# Patient Record
Sex: Female | Born: 1945 | Race: Black or African American | Hispanic: No | Smoking: Current every day smoker
Health system: Southern US, Community
[De-identification: ages and names within clinical notes are randomized; demographics above are authoritative.]

## PROBLEM LIST (undated history)

## (undated) DIAGNOSIS — C801 Malignant (primary) neoplasm, unspecified: Secondary | ICD-10-CM

## (undated) HISTORY — PX: GALLBLADDER SURGERY: SHX652

## (undated) HISTORY — PX: TOTAL KNEE ARTHROPLASTY: SHX125

## (undated) HISTORY — PX: OTHER SURGICAL HISTORY: SHX169

## (undated) HISTORY — DX: Malignant (primary) neoplasm, unspecified: C80.1

## (undated) MED FILL — Immune Globulin (Human) IV Soln 10 GM/100ML: INTRAVENOUS | Qty: 450 | Status: AC

## (undated) MED FILL — Haemophilus B Polysaccharide Conjugate Vaccine For Inj: INTRAMUSCULAR | Qty: 0.5 | Status: AC

## (undated) MED FILL — Meningococcal Vac B (Recomb OMV Adjuv) Inj Prefilled Syringe: INTRAMUSCULAR | Qty: 0.5 | Status: AC

## (undated) MED FILL — Meningococcal (A, C, Y, and W-135) Oligo Conj Vac IM Soln: INTRAMUSCULAR | Qty: 0.5 | Status: AC

---

## 1998-01-13 ENCOUNTER — Ambulatory Visit (HOSPITAL_COMMUNITY): Admission: RE | Admit: 1998-01-13 | Discharge: 1998-01-13 | Payer: Self-pay | Admitting: *Deleted

## 1998-12-05 ENCOUNTER — Ambulatory Visit (HOSPITAL_BASED_OUTPATIENT_CLINIC_OR_DEPARTMENT_OTHER): Admission: RE | Admit: 1998-12-05 | Discharge: 1998-12-05 | Payer: Self-pay | Admitting: Orthopedic Surgery

## 1998-12-07 ENCOUNTER — Ambulatory Visit (HOSPITAL_COMMUNITY): Admission: RE | Admit: 1998-12-07 | Discharge: 1998-12-07 | Payer: Self-pay | Admitting: Orthopedic Surgery

## 1998-12-07 ENCOUNTER — Encounter: Payer: Self-pay | Admitting: Orthopedic Surgery

## 1998-12-25 ENCOUNTER — Ambulatory Visit (HOSPITAL_COMMUNITY): Admission: RE | Admit: 1998-12-25 | Discharge: 1998-12-25 | Payer: Self-pay | Admitting: Orthopedic Surgery

## 1998-12-25 ENCOUNTER — Encounter: Payer: Self-pay | Admitting: Orthopedic Surgery

## 1999-01-16 ENCOUNTER — Ambulatory Visit (HOSPITAL_COMMUNITY): Admission: RE | Admit: 1999-01-16 | Discharge: 1999-01-16 | Payer: Self-pay | Admitting: Unknown Physician Specialty

## 1999-01-16 ENCOUNTER — Encounter: Payer: Self-pay | Admitting: Unknown Physician Specialty

## 1999-03-28 ENCOUNTER — Other Ambulatory Visit: Admission: RE | Admit: 1999-03-28 | Discharge: 1999-04-17 | Payer: Self-pay | Admitting: Family Medicine

## 2000-01-17 ENCOUNTER — Encounter: Payer: Self-pay | Admitting: Internal Medicine

## 2000-01-17 ENCOUNTER — Ambulatory Visit (HOSPITAL_COMMUNITY): Admission: RE | Admit: 2000-01-17 | Discharge: 2000-01-17 | Payer: Self-pay | Admitting: Internal Medicine

## 2001-01-19 ENCOUNTER — Encounter: Payer: Self-pay | Admitting: Internal Medicine

## 2001-01-19 ENCOUNTER — Ambulatory Visit (HOSPITAL_COMMUNITY): Admission: RE | Admit: 2001-01-19 | Discharge: 2001-01-19 | Payer: Self-pay | Admitting: Internal Medicine

## 2002-01-20 ENCOUNTER — Encounter: Payer: Self-pay | Admitting: Internal Medicine

## 2002-01-20 ENCOUNTER — Ambulatory Visit (HOSPITAL_COMMUNITY): Admission: RE | Admit: 2002-01-20 | Discharge: 2002-01-20 | Payer: Self-pay | Admitting: Internal Medicine

## 2002-10-26 ENCOUNTER — Encounter: Payer: Self-pay | Admitting: Orthopedic Surgery

## 2002-11-01 ENCOUNTER — Inpatient Hospital Stay (HOSPITAL_COMMUNITY): Admission: RE | Admit: 2002-11-01 | Discharge: 2002-11-04 | Payer: Self-pay | Admitting: Orthopedic Surgery

## 2003-01-24 ENCOUNTER — Encounter: Payer: Self-pay | Admitting: Internal Medicine

## 2003-01-24 ENCOUNTER — Ambulatory Visit (HOSPITAL_COMMUNITY): Admission: RE | Admit: 2003-01-24 | Discharge: 2003-01-24 | Payer: Self-pay | Admitting: Internal Medicine

## 2003-08-18 ENCOUNTER — Other Ambulatory Visit: Admission: RE | Admit: 2003-08-18 | Discharge: 2003-08-18 | Payer: Self-pay | Admitting: Oncology

## 2004-01-25 ENCOUNTER — Ambulatory Visit (HOSPITAL_COMMUNITY): Admission: RE | Admit: 2004-01-25 | Discharge: 2004-01-25 | Payer: Self-pay | Admitting: Internal Medicine

## 2004-02-10 ENCOUNTER — Ambulatory Visit: Payer: Self-pay | Admitting: Oncology

## 2004-05-30 ENCOUNTER — Ambulatory Visit: Payer: Self-pay | Admitting: Oncology

## 2004-09-05 ENCOUNTER — Ambulatory Visit: Payer: Self-pay | Admitting: Oncology

## 2004-12-07 ENCOUNTER — Ambulatory Visit: Payer: Self-pay | Admitting: Oncology

## 2005-02-05 ENCOUNTER — Ambulatory Visit (HOSPITAL_COMMUNITY): Admission: RE | Admit: 2005-02-05 | Discharge: 2005-02-05 | Payer: Self-pay | Admitting: Internal Medicine

## 2005-03-04 ENCOUNTER — Ambulatory Visit: Payer: Self-pay | Admitting: Oncology

## 2005-05-27 ENCOUNTER — Ambulatory Visit: Payer: Self-pay | Admitting: Oncology

## 2005-08-19 ENCOUNTER — Ambulatory Visit: Payer: Self-pay | Admitting: Oncology

## 2005-11-11 ENCOUNTER — Ambulatory Visit: Payer: Self-pay | Admitting: Oncology

## 2006-01-09 ENCOUNTER — Ambulatory Visit: Payer: Self-pay | Admitting: Oncology

## 2006-02-11 ENCOUNTER — Ambulatory Visit (HOSPITAL_COMMUNITY): Admission: RE | Admit: 2006-02-11 | Discharge: 2006-02-11 | Payer: Self-pay | Admitting: *Deleted

## 2006-03-17 ENCOUNTER — Inpatient Hospital Stay (HOSPITAL_COMMUNITY): Admission: RE | Admit: 2006-03-17 | Discharge: 2006-03-19 | Payer: Self-pay | Admitting: Orthopedic Surgery

## 2006-04-04 ENCOUNTER — Ambulatory Visit: Payer: Self-pay | Admitting: Oncology

## 2006-09-19 ENCOUNTER — Ambulatory Visit: Payer: Self-pay | Admitting: Oncology

## 2006-12-17 ENCOUNTER — Ambulatory Visit: Payer: Self-pay | Admitting: Oncology

## 2007-02-17 ENCOUNTER — Ambulatory Visit (HOSPITAL_COMMUNITY): Admission: RE | Admit: 2007-02-17 | Discharge: 2007-02-17 | Payer: Self-pay | Admitting: Internal Medicine

## 2007-06-27 ENCOUNTER — Emergency Department (HOSPITAL_COMMUNITY): Admission: EM | Admit: 2007-06-27 | Discharge: 2007-06-27 | Payer: Self-pay | Admitting: Emergency Medicine

## 2008-02-22 ENCOUNTER — Ambulatory Visit (HOSPITAL_COMMUNITY): Admission: RE | Admit: 2008-02-22 | Discharge: 2008-02-22 | Payer: Self-pay | Admitting: Oncology

## 2008-05-30 ENCOUNTER — Other Ambulatory Visit: Admission: RE | Admit: 2008-05-30 | Discharge: 2008-05-30 | Payer: Self-pay | Admitting: Oncology

## 2008-11-19 IMAGING — CR DG CHEST 2V
2 series · 2 of 2 positions shown · non-contrast
Comparison: none

CLINICAL DATA: Osteoarthritis.  Preadmission respiratory film.  Patient for knee replacement. 
 CHEST ? 2 VIEW:

[view not recorded (1 of 2)]
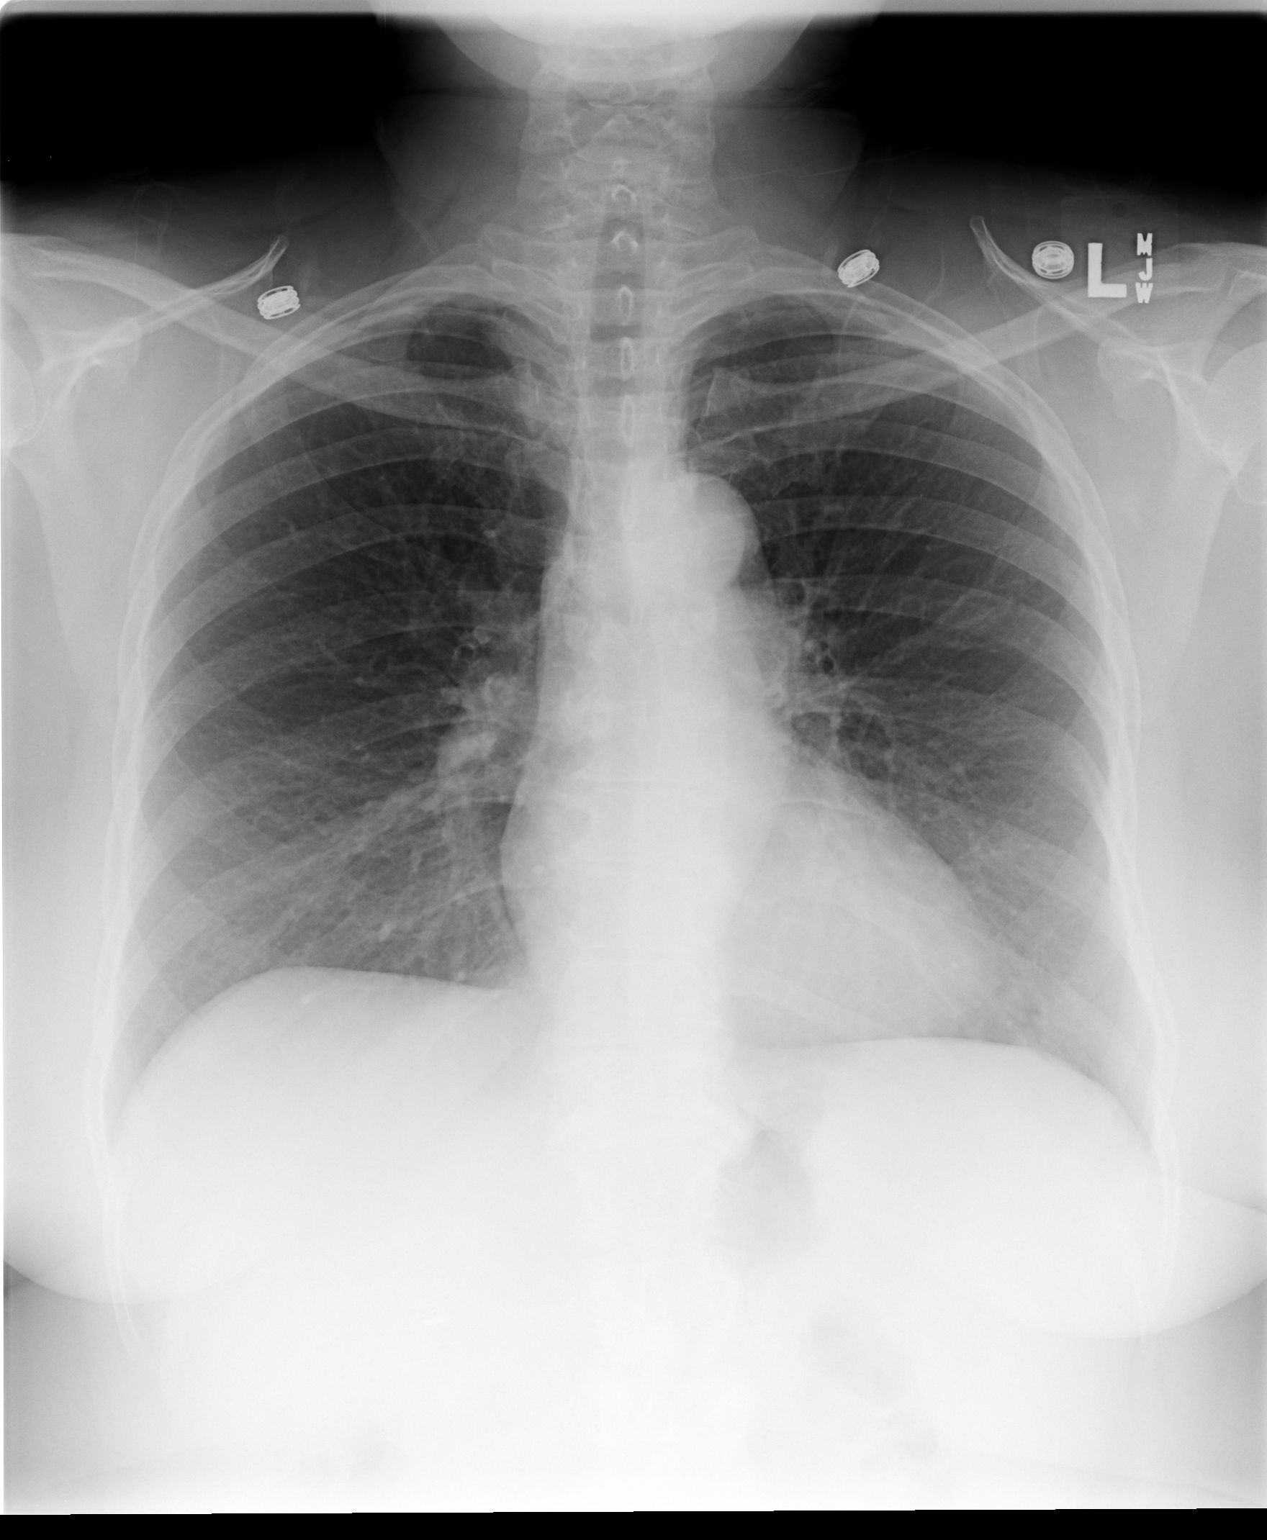

[view not recorded (2 of 2)]
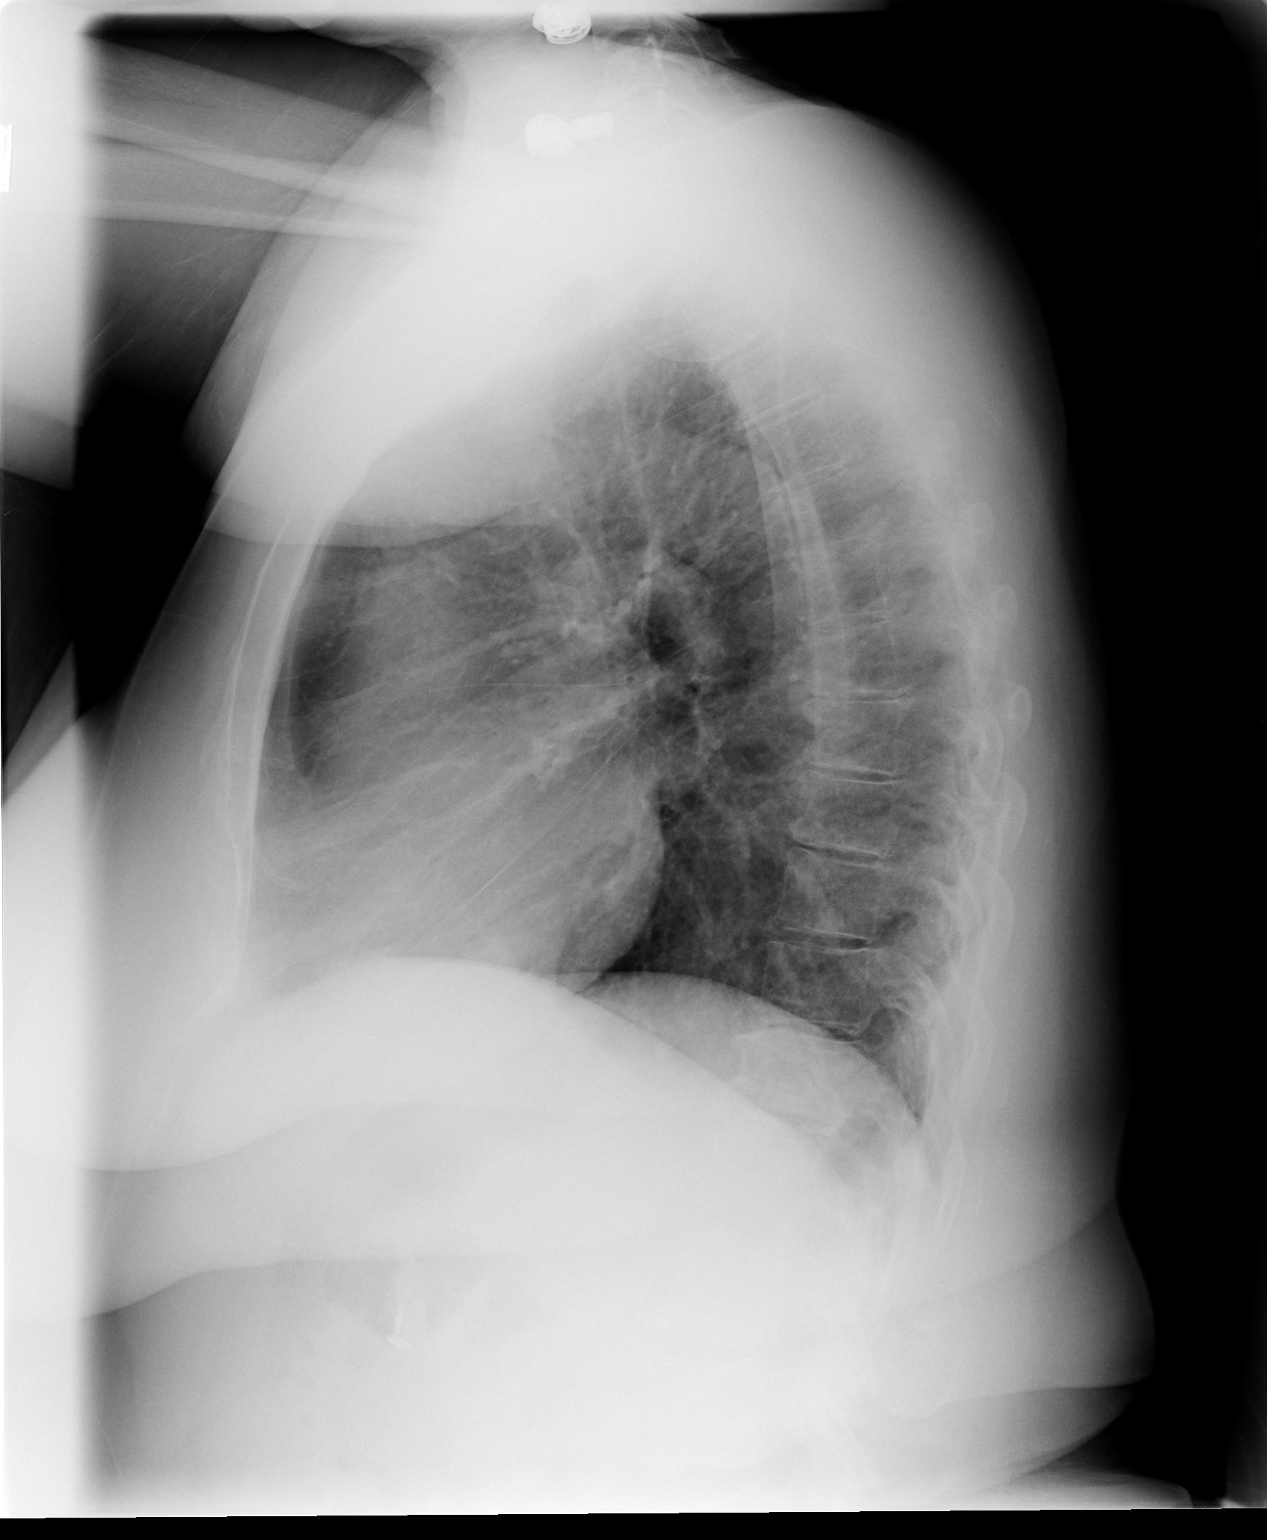

[2 of 2 positions shown; findings below may reference images not displayed]

FINDINGS: Lungs are clear.  Heart size is normal.  Costophrenic angles are sharp.  No focal bony abnormality.
IMPRESSION: No acute disease.

## 2009-02-22 ENCOUNTER — Ambulatory Visit (HOSPITAL_COMMUNITY): Admission: RE | Admit: 2009-02-22 | Discharge: 2009-02-22 | Payer: Self-pay | Admitting: Family Medicine

## 2010-02-26 ENCOUNTER — Ambulatory Visit (HOSPITAL_COMMUNITY)
Admission: RE | Admit: 2010-02-26 | Discharge: 2010-02-26 | Payer: Self-pay | Source: Home / Self Care | Admitting: Family Medicine

## 2010-08-17 NOTE — H&P (Signed)
Samantha Warren, Samantha Warren                ACCOUNT NO.:  [id number]   MEDICAL RECORD NO.:  [id number]          PATIENT TYPE:  INP   LOCATION:  NA                           FACILITY:  [facility]   PHYSICIAN:  Mila Homer. Sherlean Foot, M.D. DATE OF BIRTH:  Apr 16, 1945   DATE OF ADMISSION:  03/17/2006  DATE OF DISCHARGE:                              HISTORY & PHYSICAL   CHIEF COMPLAINT:  Right knee pain for the last 3 years.   HISTORY OF PRESENT ILLNESS:  This 65 year old black female patient  presented to Dr. Sherlean Foot with a history of a left knee replacement done by  him in 2004.  That knee has done well and she has had a 3-year history  of gradual onset but progressively worsening right knee pain.  She has  had no known injury or prior surgery to her right knee.  The pain is now  described as an intermittent aching sensation diffuse about the knee  with radiation distally into the tibia.  Her pain increases with  weightbearing and decreases with lying down.  The knee does pop and give  way on occasion but there is no catching, grinding, locking or swelling.  She is currently been taking Vicodin for pain with a moderate amount of  relief.  She has received cortisone injections in the past with moderate  relief.   ALLERGIES:  SULFA CAUSES RASH AND HIVES.   CURRENT MEDICATIONS:  1. Calcium plus vitamin D 600 mg, one tablet p.o. q.a.m.  2. Advil 200 mg, two tablets p.o. q.a.m.  3. Multivitamin 1 tablet p.o. q.a.m.  4. Hydrocodone 5/500 mg, one to two p.o. q.4 h p.r.n. for pain.  5. Energy supplement 357 magnum, one p.o. q.a.m., last dose December      8.  6. Zantac OTC 1 tablet p.o. q.a.m. p.r.n. reflux.   PAST MEDICAL HISTORY:  1. Chronic lymphocytic leukemia diagnosed in 2004, treated by Dr.      Gilman Buttner at the [facility] here in [place].  2. Hypertension, diet-controlled.  3. Hypertriglyceridemia.  4. Gastroesophageal reflux disease.   PAST SURGICAL HISTORY:  1. Left total knee arthroplasty  by Dr. Mila Homer.  Lucey, November 04, 2002.  2. Total abdominal hysterectomy, 2005.  3. Cholecystectomy, 1996.  4. Left carpal tunnel release, 2000.  5. Left knee arthroscopy, October 2000, by Dr. Mila Homer.  Lucey.   She denies any complications from the above-mentioned procedures.   SOCIAL HISTORY:  She smokes a pack of cigarettes a week and has done so  on and off for about 43 years.  She does drink alcohol about three  glasses of alcohol on the weekends.  She does not use any drugs.  She  lives by herself in a one-story house.  She has five sons, age 64, 49,  1, 82, and 57.  She currently works as a Company secretary, and she is  divorced.   Her medical doctor is Dr. Marquette Saa Leukocyte esterase in [facility].   Her hematologist is Dr. Gilman Buttner at [facility].   FAMILY HISTORY:  Mother  died at age of 61 with leukemia.  Father died at  age of 54 with strokes.  She has a brother who died at age 98 with  diabetes and kidney disease.  She has a living sister age 31 with  diabetes and strokes.  Her grandparents did have leukemia.  Children  have a history of heart disease, hypertension, and diabetes.   REVIEW OF SYSTEMS:  She does wear glasses.  She has a partial plate on  both her upper and lower jaw line.  She does have the leukemia which is  under control.  She has had some weight gain probably over the last  couple years, nothing major.  All other systems are negative and  noncontributory.   PHYSICAL EXAMINATION:  GENERAL:  Well-developed, well-nourished, mildly  overweight black female in no acute distress.  Walks with an antalgic  gait.  Mood and affect are appropriate.  Talks easily with examiner.  VITAL SIGNS:  Height 5 feet 3 inches, weight 196 pounds, BMI is 34, temp  98.4 degrees Fahrenheit, pulse 84, respirations 18 and BP 122/70.  HEENT:  Normocephalic, atraumatic without frontal or maxillary sinus  tenderness to palpation.  Conjunctiva pink.  Sclerae anicteric.   PERRLA.  EOMs intact.  No visible external ear deformities.  Hearing grossly  intact.  Tympanic membranes pearly gray bilaterally with good light  reflex.  Nose:  The nasal septum midline.  Nasal mucosa pink and moist  without exudates or polyps noted.  Buccal mucosa pink and moist.  Dentition in good repair with plates in place.  Pharynx without erythema  or exudates.  Tongue and uvula midline.  Tongue without fasciculations  and uvula rises equally with phonation.  NECK:  No visible masses or lesions noted.  Trachea midline.  No  palpable lymphadenopathy or thyromegaly.  Carotids +2 bilaterally  without bruits.  Full range of motion, nontender to palpation along the  cervical spine.  CARDIOVASCULAR:  Heart rate and rhythm regular.  S1-S2 present without  rubs, clicks or murmurs noted.  RESPIRATORY:  Respirations even and unlabored.  Breath sounds clear to  auscultation bilaterally without rales or wheezes noted.  ABDOMEN:  Rounded abdominal contour.  Bowel sounds present x4 quadrants.  Soft, nontender to palpation without hepatosplenomegaly nor CVA  tenderness.  Femoral pulses +2 bilaterally.  Nontender to palpation  along the vertebral column.  BREASTS/GU/RECTAL/PELVIC:  These exams deferred at this time.  MUSCULOSKELETAL:  No obvious deformities.  Bilateral upper extremities  with full range of motion.  These extremities without pain.  Radial  pulses +2 bilaterally.  She has full range of motion of her hips, ankles  and toes bilaterally.  DP and PT pulses are +2.  No calf pain with  palpation.  No lower extremity edema.  Negative Homan sign.  Left knee  has a well-healed midline incision.  She has full extension and flexion  to 110 degrees without crepitus.  She is stable to varus and valgus  stress.  Negative anterior drawer.  No effusion in the knee and no pain with palpation along the joint line.  Right knee has full extension and  flexion to 105 degrees with minimal crepitus.   There is pain with  patellar grind test but no pain along the joint line at this time.  There is no effusion.  Stable to varus and valgus stress.  Negative  anterior drawer.  NEUROLOGIC:  Alert and oriented x3.  Cranial nerves II-XII are grossly  intact.  Strength 5/5 bilateral upper and lower extremities.  Rapid  alternating movements intact.  Deep tendon reflexes 2+ bilateral upper  and lower extremities.  Sensation intact to light touch.   RADIOLOGIC FINDINGS:  X-rays taken of the right knee in November 2007,  show complete medial compartment collapse of that right knee.  The left  knee shows prosthesis in good position.   IMPRESSION:  1. End-stage osteoarthritis, right knee.  2. Status post left knee replacement in 2004.  3. Chronic lymphocytic leukemia.  4. Hypertension.  5. Hypertriglyceridemia.  6. Gastroesophageal reflux disease.   PLAN:  Ms. Rondeau will be admitted to [facility] on March 17, 2006, where she will undergo a right total knee arthroplasty by Dr.  Mila Homer.  Lucey.  She will undergo all the routine preoperative  laboratory tests and studies prior to this procedure.  If we have any  medical issues while she is hospitalized, we will consult the  hospitalists.      Legrand Pitts Duffy, P.A.    ______________________________  Mila Homer. Sherlean Foot, M.D.    KED/MEDQ  D:  03/11/2006  T:  03/11/2006  Job:  [id number]

## 2010-08-17 NOTE — H&P (Signed)
NAME:  Samantha Warren, Samantha Warren                          ACCOUNT NO.:  [id number]   MEDICAL RECORD NO.:  [id number]                   PATIENT TYPE:  INP   LOCATION:  NA                                   FACILITY:  [facility]   PHYSICIAN:  Mila Homer. Sherlean Foot, M.D.              DATE OF BIRTH:  [birth year]   DATE OF ADMISSION:  11/01/2002  DATE OF DISCHARGE:                                HISTORY & PHYSICAL   CHIEF COMPLAINT:  Left knee pain.   HISTORY OF PRESENT ILLNESS:  Ms. Brissette is a 65 year old female with a five-  year history of left knee pain.  In October of 2000, she underwent a left  knee arthroscopy for a medial meniscal tear.  On arthroscopic examination,  she was found to have a medial meniscal tear and a plica off the grade III  chondromalacia.  She states she received very minimal relief from her pain  after undergoing arthroscopy.  The patient denies any known injury to the  left knee.  She denies pain while at rest.  The pain is associated with  being weightbearing.  The patient has difficulty walking long distances and  climbing stairs.  She has mechanical symptoms that include giving way and  popping.  She denies any catching or locking sensations.  Currently, she  takes Vicodin and Naproxen for the pain and it is moderate relief.  She has  tried Cortisone injections in the past, only getting approximately one month  of relief at maximum.  She failed Synvisc injections.  She describes most of  her pain to be an aching stabbing sensation that radiates down into her  proximal femur.  Most of her pain is located in the medial compartment.  She  uses no assistive devices to ambulate.   X-rays show complete medial compartment collapse in the left knee.  The  patient is to be admitted to [facility] on November 01, 2002, to undergo left TKR.   ALLERGIES:  SULFA DRUGS.   CURRENT MEDICATIONS:  1. Naproxen 500 mg one b.i.d.  2. Vicodin one to two tablets q.4h. p.r.n.  pain.  3. Centrum one daily.  4. Magnum 357 one a day.  Apparently this is for the patient to boost her     energy.   PAST MEDICAL HISTORY:  None.   PAST SURGICAL HISTORY:  1. Carpal tunnel release in 2002.  2. Left knee arthroscopy in 2000.  3. Cholecystectomy in 1997.  4. Hysterectomy in 1995.   SOCIAL HISTORY:  The patient smokes approximately four cigarettes a day and  has done so for 35 years.  She uses alcoholic beverages on the weekend.  She  is divorced and has three grown children.  The patient's primary care  physician is Dr. Nedra Hai at phone number [phone].  She reports having a  checkup approximately two months ago.  She lives in  a one story home with  four steps to the usual entrance.  The patient is employed at [employer].   FAMILY HISTORY:  Mother deceased at age 13 with leukemia.  Her only other  known health problem was arthritis.  Father deceased at age 16 with a stroke  and a history of hypertension.  Otherwise, family history is positive for  diabetes mellitus, brain tumor and stroke.   REVIEW OF SYSTEMS:  Dentures both upper and lower.  She wears glasses.  She  denies chest pain, shortness of breath, PND, and orthopnea.  Nocturia three  to four times a night.  History of an elevated white blood cell count and  the patient is unsure of status.  She did not follow up with Dr. Nedra Hai on September 19, 2002.   PHYSICAL EXAMINATION:  GENERAL APPEARANCE:  A well-developed, well-  nourished, obese female who walks with a limp and antalgic gait.  The  patient's BMI is 34.9.  The patient's mood and affect are appropriate.  She  talks easily with the examiner.  VITAL SIGNS:  The patient's height is 5 feet 3 inches, weight is 197 pounds.  Temperature 97.3, pulse 70, blood pressure 140/80, respiratory rate 16.  HEENT:  Normocephalic and atraumatic.  Palpation over the maxillary and  frontal sinuses reveal no tenderness.  Sclerae are nonicteric bilaterally.  Conjunctivae are  pink bilaterally.  PERRLA.  EOM intact.  There are no  visible external ear deformities.  The TMs are gray and pearly bilaterally.  Nose and nasal septum are midline.  Nasal mucosa is pink and no polyps  noted.  Buccal mucosa is pink and moist.  Pharynx is without erythema and  exudate.  The tongue and uvula are midline.  NECK:  Trachea midline.  No lymphadenopathy noted.  Carotids are 2+  bilaterally without bruits.  Cervical spine is nontender to palpation.  Cervical spine has full range of motion.  LUNGS:  Lungs are clear to auscultation bilaterally.  There is no rhonchi or  wheezing noted.  CARDIOVASCULAR:  Regular rate and rhythm.  No murmurs, rubs, or gallops  noted.  ABDOMEN:  Obese.  Soft and nontender.  Bowel sounds heard in all four  quadrants.  BACK:  No tenderness noted with palpation over the thoracic and lumbar  spine.  BREASTS, GENITOURINARY AND RECTAL:  Examinations all deferred at this time.  MUSCULOSKELETAL:  Upper extremities have full range of motion.  Radial  pulses are 2+ bilaterally and are equal and symmetric.  Lower extremities  have full range of motion of both hips.  Right knee has full extension  placed to 120 degrees.  Crepitus noted with passive range of motion.  Valgus  varus stressing reveals no laxity.  Left knee placed into 110 degrees  extension full.  McMurray is positive.  Valgus varus stressing reveals no  instability.  No tenderness along the joint line.  No edema noted in the  lower extremities.  Dorsal pedal pulses are 2+ bilaterally.  EHL and SHL  intact.  NEUROLOGIC:  Cranial nerves II-XII grossly intact.  The patient is alert and  oriented x3.  Upper and lower extremity strength are 5/5 against resistance.  Deep tendon reflexes are 2+ bilaterally in the upper and lower extremities  except for the left patellar reflex which is 1+.   IMPRESSION:  1. Severe osteoarthritis of the medial compartment left knee.  2. History of  leukocytosis.  PLAN:  The patient will be admitted to  [place] on  November 01, 2002, to undergo left total knee replacement.  We will follow up  with Dr. Nedra Hai on history of elevated white blood cell count and see if this  needs any follow-up prior to surgery and if he has any concerns with the  patient undergoing left total knee replacement.      Richardean Canal, P.A.                       Mila Homer. Sherlean Foot, M.D.    GC/MEDQ  D:  10/26/2002  T:  10/26/2002  Job:  [id number]

## 2010-08-17 NOTE — Discharge Summary (Signed)
NAME:  Samantha Warren, Samantha Warren                          ACCOUNT NO.:  [id number]   MEDICAL RECORD NO.:  [id number]                   PATIENT TYPE:  INP   LOCATION:  5033                                 FACILITY:  [facility]   PHYSICIAN:  Mila Homer. Sherlean Foot, M.D.              DATE OF BIRTH:  [birth year]   DATE OF ADMISSION:  11/01/2002  DATE OF DISCHARGE:  11/04/2002                                 DISCHARGE SUMMARY   ADMISSION DIAGNOSES:  1. Severe osteoarthritis, left knee, medial compartment.  2. History of leukocytosis.   DISCHARGE DIAGNOSES:  1. Status post left total knee arthroplasty.  2. History of leukocytosis.  3. Postoperative anemia secondary to surgery, asymptomatic.   HISTORY OF PRESENT ILLNESS:  Samantha Warren is a 65 year old female with a five  year history of left knee pain.  She underwent a left knee arthroscopy for  medial meniscal tear in October 2000.  At that time, she was found to have a  medial meniscal tear and a grade 3 patellofemoral chondromalacia.  She got  very minimal relief after undergoing arthroscopy.  She denies any known  injury to the left knee.  She does not have any pain while at rest.  Pain is  associated only with weightbearing.  She has difficulty walking long  distances and climbing stairs.  Mechanical symptoms include giving way and  popping.  She denies any catching or locking sensations.  Pain medications  include Vicodin and Naproxen, and this gives her mild relief.  She tried  cortisone injections in the past with maximal relief for one month.  She has  failed Synvisc injections.  Her left knee pain is described as aching,  stabbing sensation that radiates down into her proximal tibia.  Most of her  pain is located in the medial compartment of her left knee.  She uses no  assistive devices to ambulate.  X-rays of her left knee show medial  compartment collapse.   ALLERGIES:  SULFA DRUGS.   CURRENT MEDICATIONS:  1. Naproxen 500 mg one b.i.d.  2.  Vicodin one to two tabs q.4h. p.r.n. pain.  3. Centrum one q. day.  4. Magnum 357 one a day, apparently this is to boost her energy.   SURGICAL PROCEDURE:  The patient was taken to the operating room on November 01, 2002, by Dr. Georgena Spurling, assisted by Arnoldo Morale, P.A.-C.  The patient  was placed under general anesthesia and a preop femoral nerve block was  performed.  The patient then underwent a left total knee arthroplasty.  The  patient tolerated the procedure well, and she returned to the PACU in good  stable condition.   CONSULTATIONS:  1. PT.  2. Rehab.  3. Occupational therapy.   HOSPITAL COURSE:  The patient developed postoperative anemia secondary to  surgery.  However, the patient remained asymptomatic.  Vital signs were  stable, and she remained afebrile  throughout her hospital stay.   LABORATORY DATA:  CBC on admission:  White blood count 12.8, hemoglobin  12.6, hematocrit 37.8, platelets 203.   Coag's on admission:  PT 12.9, INR 1, PTT 25.   Routine chemistries on admission:  Sodium 141, potassium 4.3, chloride 109,  bicarb 77, glucose 94, BUN 15, creatinine 0.6.   Hepatic enzymes on admission:  AST 20, ALT 14, ALP 66, and total bilirubin  was 0.7.   DISCHARGE MEDICATIONS:  The patient may resume preoperative meds except no  Naproxen or Vicodin while using Percocet.  She is to have the following  meds:  1. Lovenox 40 mg injection subcu once a day x11 days.  2. Take iron supplement tab two times a day for one month.  3. OxyContin 10 mg one tab q.12h. as needed for pain.  4. Percocet 5 mg one to two tabs q.4-6h. as needed for pain.   ACTIVITY:  As tolerated with walker.  Home health services and PT with  [place].   DIET:  No restrictions.   CONDITION:  The patient was discharged in good stable condition to home.   FOLLOWUP:  The patient needs a follow up visit with Dr. Sherlean Foot in 10 to 12  days after discharge.  The patient is to call the office at [phone]  for  appointment.  The patient is also to follow up with her primary care doctor,  Dr. Nedra Hai, for her leukocytosis.   WOUND CARE:  Keep wound clean and dry.  Change dressing daily.  May shower  after two days of no drainage.  The patient is to call Dr. Tobin Chad office if  she develops any swelling, temperature greater then 100.5, chills, swelling,  foul smelling drainage from the wound site, or if pain medications do not  control the pain.   SPECIAL INSTRUCTIONS:  CPM 0 to 90 degrees six to eight hours a day,  increase by 10 degrees daily.      Richardean Canal, P.A.                       Mila Homer. Sherlean Foot, M.D.    GC/MEDQ  D:  11/26/2002  T:  11/27/2002  Job:  [id number]

## 2010-08-17 NOTE — Op Note (Signed)
NAME:  Samantha Warren, Samantha Warren                          ACCOUNT NO.:  [id number]   MEDICAL RECORD NO.:  [id number]                   PATIENT TYPE:  INP   LOCATION:  5033                                 FACILITY:  [facility]   PHYSICIAN:  Mila Homer. Sherlean Foot, M.D.              DATE OF BIRTH:  [birth year]   DATE OF PROCEDURE:  11/01/2002  DATE OF DISCHARGE:                                 OPERATIVE REPORT   PREOPERATIVE DIAGNOSIS:  Left knee arthritis.   POSTOPERATIVE DIAGNOSIS:  Left knee arthritis.   PROCEDURE:  A left total knee arthroplasty.   INDICATIONS FOR PROCEDURE:  The patient has failed conservative measures of  osteoarthritis of the knee.  An informed consent was obtained for a TKA.   DESCRIPTION OF PROCEDURE:  The patient was taken to the operating room and  administered general anesthesia.  The left leg was prepped and draped in the  usual sterile fashion.  A #10 blade was used to make a midline incision 15  cm in length.  A new #10 blade was used to make a parapatellar arthrotomy,  and dissected __________ the medial crest of the fibula.  I then everted the  patella and measured at 20 mm.  I used a reamer to ream down to 12 mm and  drilled through the trial three lug holes.  With the trial in place, I also  made a 20 mm.  I removed the trial and then went into flexion, the ACL to  PCL with subluxing the fibula floor.  I used the extramedullary system to  remove 2 mm of bone of the patella and made a perpendicular cut in the  anterior axis of the fibula.  I then made intramedullary drill hole in the  distal femur.  I then placed a intramedullary guide pin on a six-degree  valgus cut.  I pinned a distal femoral cutting block all through the distal  femur and removed the intramedullary guide.  I then made our distal femoral  cut with a sagittal saw.  I then sized to a size D, tamped it at a 3-degree  external rotation hold and screwed in a 4 in 1 cutting block.  I then made  our  anterior and posterior and chamfer cuts.  I then placed the laminar  spreader in the flexion gap and removed the ACL and PCL, the medial  meniscus, the lateral meniscus, and posterior chondral osteophyte.  I then  placed a 10 mm spacer block in place, and had a good flexion and extension  gap balance, but it wanted to pull loose.  I decided to go to a 12 mm spacer  block a trial.  I then finished the femur with a size-B finishing block.  I  finished the tibia with a size #3 finishing tray, and then trialed with a  size #3 tibia, size #12 and excised the femur size #32  patella, and an  actual flexion extension gap and balance, with good range of motion and  excellent patella tracking.  I removed the component, and at this point  irrigated, and then I cemented in the fibula first, the femur second, and  then snapped in the  real polyethylene, and cemented in the patella.  I then  placed the leg in extension and allowed the cement to harden.  I then let  down the tourniquet.  The tourniquet time was one hour and five minutes.  I  cauterized bleeding vessels and closed with interrupted #1 Vicryl in the  arthrotomy, #0 Vicryl in the deep and soft tissues, and running #2-0 Vicryl  in the subcuticular layer, and skin staples.  A dressing with Xeroform  dressing, as  sterile wrap and a TED stocking.   COMPLICATIONS:  None.    DRAINS:  One Hemovac deep to the arthrotomy.   ESTIMATED BLOOD LOSS:  300 ml.                                                Mila Homer. Sherlean Foot, M.D.    SDL/MEDQ  D:  11/01/2002  T:  11/01/2002  Job:  [id number]

## 2010-08-17 NOTE — Op Note (Signed)
Samantha Warren, Samantha Warren                ACCOUNT NO.:  [id number]   MEDICAL RECORD NO.:  [id number]          PATIENT TYPE:  INP   LOCATION:  5040                         FACILITY:  [facility]   PHYSICIAN:  Mila Homer. Sherlean Foot, M.D. DATE OF BIRTH:  [birth year]   DATE OF PROCEDURE:  03/17/2006  DATE OF DISCHARGE:                               OPERATIVE REPORT   SURGEON:  Mila Homer. Sherlean Foot, M.D.   ASSISTANT:  __________, PA   ANESTHESIA:  General.   PREOPERATIVE DIAGNOSIS:  Right knee osteoarthritis.   POSTOPERATIVE DIAGNOSIS:  Right knee osteoarthritis.   PROCEDURE:  Right total knee arthroplasty.   INDICATIONS FOR PROCEDURE:  The patient is a 65 year old black female  who failed conservative measures for osteoarthritis of the knee.  Informed consent obtained.   DESCRIPTION OF PROCEDURE:  The patient was laid supine, administered  general anesthesia.  The right lower extremity was prepped and draped in  the usual sterile fashion.  After a sterile prep and drape, the  extremity was exsanguinated with the Esmarch and elevated to 350 mmHg.  A midline incision was made with a #10 blade.  This was approximately 6  inches in length.  A new blade was used to make a medial parapatellar  arthrotomy and perform a synovectomy.  I elevated the deep MCL off the  medial crest of the tibia.  I then used the extramedullary alignment  guide on the tibia to make appropriate cut to the anatomic axis of the  tibia removing 2 mm of bone off the medial tibial articular surface,  which was the low side.  I then made an intramedullary drill hole into  the femur.  I used the intramedullary guide set on 6 for a valgus cut.  I put the distal femoral cutting block in place, made the distal femoral  cut with a sagittal saw.  I drew out the epicondylar axis, measured the  posterior condylar angle to be 3 degrees.  I then sized to a size D pin  through the 3-degree external rotation holes.  I then placed on the 4-in-  1  cutting block, screwed it into place, and made the anterior,  posterior, and chamfer cuts with a sagittal saw.  I then removed the  cutting block and placed the 10-mm spacer block in the knee.  Had good  flexion extension gap balance, the alignment rod fell right to the  anatomic axis.  I then finished the femur with a size D finishing block  drill and keel, as well as cutting for a High-flex.  I then finished the  tibia with a size 3 tibial tray, drill, and keel.  I then trialed with a  size 3 tibial tray, D femur High-flex, and 10 insert High-flex, with a  32-mm patella.  Dropping angles back to 130 degrees, limited only by the  thigh/calf ratio.  The mediolateral stability was balanced in flexion  and extension and the patella tracked perfectly.  I then removed the  components and copiously irrigated.  I then cemented in the components  and removed excess cement.  I  let the cement harden in extension.  I  then placed a Hemovac deep to the arthrotomy and superolaterally.  The  pain catheter coming out superomedially and superficial to the  arthrotomy.  I then closed the arthrotomy once the tourniquet was let  down.  Hemostasis was obtained.  I irrigated the knee and then closed  with figure-of-eight #1 Vicryl sutures, deep soft tissues with  interrupted buried 0 Vicryl sutures and subcuticular 2-0 Vicryl stitch  and skin staples.   COMPLICATIONS:  None.   DRAINS:  One Hemovac and one pain catheter.   ESTIMATED BLOOD LOSS:  300 mL.           ______________________________  Mila Homer. Sherlean Foot, M.D.     SDL/MEDQ  D:  03/17/2006  T:  03/18/2006  Job:  [id number]

## 2011-01-31 ENCOUNTER — Other Ambulatory Visit (HOSPITAL_COMMUNITY): Payer: Self-pay | Admitting: Internal Medicine

## 2011-01-31 DIAGNOSIS — Z1231 Encounter for screening mammogram for malignant neoplasm of breast: Secondary | ICD-10-CM

## 2011-03-07 ENCOUNTER — Ambulatory Visit (HOSPITAL_COMMUNITY)
Admission: RE | Admit: 2011-03-07 | Discharge: 2011-03-07 | Disposition: A | Discharge status: Home / Self Care | Payer: BC Managed Care – PPO | Source: Ambulatory Visit | Attending: Internal Medicine | Admitting: Internal Medicine

## 2011-03-07 DIAGNOSIS — Z1231 Encounter for screening mammogram for malignant neoplasm of breast: Secondary | ICD-10-CM

## 2011-09-09 DIAGNOSIS — D702 Other drug-induced agranulocytosis: Secondary | ICD-10-CM | POA: Diagnosis not present

## 2011-09-09 DIAGNOSIS — C9110 Chronic lymphocytic leukemia of B-cell type not having achieved remission: Secondary | ICD-10-CM | POA: Diagnosis not present

## 2011-09-09 DIAGNOSIS — D649 Anemia, unspecified: Secondary | ICD-10-CM | POA: Diagnosis not present

## 2011-09-09 DIAGNOSIS — D809 Immunodeficiency with predominantly antibody defects, unspecified: Secondary | ICD-10-CM | POA: Diagnosis not present

## 2011-09-10 DIAGNOSIS — D809 Immunodeficiency with predominantly antibody defects, unspecified: Secondary | ICD-10-CM | POA: Diagnosis not present

## 2011-09-10 DIAGNOSIS — C9110 Chronic lymphocytic leukemia of B-cell type not having achieved remission: Secondary | ICD-10-CM | POA: Diagnosis not present

## 2011-09-10 DIAGNOSIS — Z5111 Encounter for antineoplastic chemotherapy: Secondary | ICD-10-CM | POA: Diagnosis not present

## 2011-09-11 DIAGNOSIS — C9110 Chronic lymphocytic leukemia of B-cell type not having achieved remission: Secondary | ICD-10-CM | POA: Diagnosis not present

## 2011-09-11 DIAGNOSIS — D809 Immunodeficiency with predominantly antibody defects, unspecified: Secondary | ICD-10-CM | POA: Diagnosis not present

## 2011-10-01 DIAGNOSIS — I10 Essential (primary) hypertension: Secondary | ICD-10-CM | POA: Diagnosis not present

## 2011-10-01 DIAGNOSIS — E781 Pure hyperglyceridemia: Secondary | ICD-10-CM | POA: Diagnosis not present

## 2011-10-01 DIAGNOSIS — J309 Allergic rhinitis, unspecified: Secondary | ICD-10-CM | POA: Diagnosis not present

## 2011-10-01 DIAGNOSIS — E7800 Pure hypercholesterolemia, unspecified: Secondary | ICD-10-CM | POA: Diagnosis not present

## 2011-10-01 DIAGNOSIS — K219 Gastro-esophageal reflux disease without esophagitis: Secondary | ICD-10-CM | POA: Diagnosis not present

## 2011-10-04 DIAGNOSIS — Z438 Encounter for attention to other artificial openings: Secondary | ICD-10-CM | POA: Diagnosis not present

## 2011-10-04 DIAGNOSIS — H6520 Chronic serous otitis media, unspecified ear: Secondary | ICD-10-CM | POA: Diagnosis not present

## 2011-10-07 DIAGNOSIS — C9110 Chronic lymphocytic leukemia of B-cell type not having achieved remission: Secondary | ICD-10-CM | POA: Diagnosis not present

## 2011-10-07 DIAGNOSIS — D809 Immunodeficiency with predominantly antibody defects, unspecified: Secondary | ICD-10-CM | POA: Diagnosis not present

## 2011-10-08 DIAGNOSIS — D809 Immunodeficiency with predominantly antibody defects, unspecified: Secondary | ICD-10-CM | POA: Diagnosis not present

## 2011-10-08 DIAGNOSIS — Z5111 Encounter for antineoplastic chemotherapy: Secondary | ICD-10-CM | POA: Diagnosis not present

## 2011-10-08 DIAGNOSIS — R059 Cough, unspecified: Secondary | ICD-10-CM | POA: Diagnosis not present

## 2011-10-08 DIAGNOSIS — C9110 Chronic lymphocytic leukemia of B-cell type not having achieved remission: Secondary | ICD-10-CM | POA: Diagnosis not present

## 2011-10-09 DIAGNOSIS — R05 Cough: Secondary | ICD-10-CM | POA: Diagnosis not present

## 2011-10-09 DIAGNOSIS — C9110 Chronic lymphocytic leukemia of B-cell type not having achieved remission: Secondary | ICD-10-CM | POA: Diagnosis not present

## 2011-10-09 DIAGNOSIS — D809 Immunodeficiency with predominantly antibody defects, unspecified: Secondary | ICD-10-CM | POA: Diagnosis not present

## 2011-10-14 DIAGNOSIS — R509 Fever, unspecified: Secondary | ICD-10-CM | POA: Diagnosis not present

## 2011-10-14 DIAGNOSIS — R05 Cough: Secondary | ICD-10-CM | POA: Diagnosis not present

## 2011-10-14 DIAGNOSIS — R059 Cough, unspecified: Secondary | ICD-10-CM | POA: Diagnosis not present

## 2011-10-14 DIAGNOSIS — C9110 Chronic lymphocytic leukemia of B-cell type not having achieved remission: Secondary | ICD-10-CM | POA: Diagnosis not present

## 2011-10-14 DIAGNOSIS — D809 Immunodeficiency with predominantly antibody defects, unspecified: Secondary | ICD-10-CM | POA: Diagnosis not present

## 2011-11-11 DIAGNOSIS — C9110 Chronic lymphocytic leukemia of B-cell type not having achieved remission: Secondary | ICD-10-CM | POA: Diagnosis not present

## 2011-12-12 DIAGNOSIS — C9110 Chronic lymphocytic leukemia of B-cell type not having achieved remission: Secondary | ICD-10-CM | POA: Diagnosis not present

## 2011-12-12 DIAGNOSIS — D709 Neutropenia, unspecified: Secondary | ICD-10-CM | POA: Diagnosis not present

## 2012-01-01 DIAGNOSIS — Z23 Encounter for immunization: Secondary | ICD-10-CM | POA: Diagnosis not present

## 2012-01-01 DIAGNOSIS — I10 Essential (primary) hypertension: Secondary | ICD-10-CM | POA: Diagnosis not present

## 2012-01-01 DIAGNOSIS — J309 Allergic rhinitis, unspecified: Secondary | ICD-10-CM | POA: Diagnosis not present

## 2012-01-01 DIAGNOSIS — Z9181 History of falling: Secondary | ICD-10-CM | POA: Diagnosis not present

## 2012-01-01 DIAGNOSIS — Z1331 Encounter for screening for depression: Secondary | ICD-10-CM | POA: Diagnosis not present

## 2012-01-01 DIAGNOSIS — F17200 Nicotine dependence, unspecified, uncomplicated: Secondary | ICD-10-CM | POA: Diagnosis not present

## 2012-01-01 DIAGNOSIS — E7800 Pure hypercholesterolemia, unspecified: Secondary | ICD-10-CM | POA: Diagnosis not present

## 2012-01-04 DIAGNOSIS — S335XXA Sprain of ligaments of lumbar spine, initial encounter: Secondary | ICD-10-CM | POA: Diagnosis not present

## 2012-01-04 DIAGNOSIS — S139XXA Sprain of joints and ligaments of unspecified parts of neck, initial encounter: Secondary | ICD-10-CM | POA: Diagnosis not present

## 2012-01-08 DIAGNOSIS — Z6836 Body mass index (BMI) 36.0-36.9, adult: Secondary | ICD-10-CM | POA: Diagnosis not present

## 2012-01-08 DIAGNOSIS — M549 Dorsalgia, unspecified: Secondary | ICD-10-CM | POA: Diagnosis not present

## 2012-01-09 DIAGNOSIS — R059 Cough, unspecified: Secondary | ICD-10-CM | POA: Diagnosis not present

## 2012-01-09 DIAGNOSIS — D809 Immunodeficiency with predominantly antibody defects, unspecified: Secondary | ICD-10-CM | POA: Diagnosis not present

## 2012-01-09 DIAGNOSIS — C9110 Chronic lymphocytic leukemia of B-cell type not having achieved remission: Secondary | ICD-10-CM | POA: Diagnosis not present

## 2012-01-09 DIAGNOSIS — R05 Cough: Secondary | ICD-10-CM | POA: Diagnosis not present

## 2012-02-04 ENCOUNTER — Other Ambulatory Visit (HOSPITAL_COMMUNITY): Payer: Self-pay | Admitting: Internal Medicine

## 2012-02-04 DIAGNOSIS — Z1231 Encounter for screening mammogram for malignant neoplasm of breast: Secondary | ICD-10-CM

## 2012-02-10 DIAGNOSIS — D809 Immunodeficiency with predominantly antibody defects, unspecified: Secondary | ICD-10-CM | POA: Diagnosis not present

## 2012-02-10 DIAGNOSIS — R05 Cough: Secondary | ICD-10-CM | POA: Diagnosis not present

## 2012-02-10 DIAGNOSIS — R059 Cough, unspecified: Secondary | ICD-10-CM | POA: Diagnosis not present

## 2012-02-10 DIAGNOSIS — C9110 Chronic lymphocytic leukemia of B-cell type not having achieved remission: Secondary | ICD-10-CM | POA: Diagnosis not present

## 2012-03-09 ENCOUNTER — Ambulatory Visit (HOSPITAL_COMMUNITY)
Admission: RE | Admit: 2012-03-09 | Discharge: 2012-03-09 | Disposition: A | Discharge status: Home / Self Care | Payer: BC Managed Care – PPO | Source: Ambulatory Visit | Attending: Internal Medicine | Admitting: Internal Medicine

## 2012-03-09 DIAGNOSIS — Z1231 Encounter for screening mammogram for malignant neoplasm of breast: Secondary | ICD-10-CM | POA: Diagnosis not present

## 2012-03-17 ENCOUNTER — Other Ambulatory Visit: Payer: Self-pay | Admitting: Internal Medicine

## 2012-03-17 DIAGNOSIS — R928 Other abnormal and inconclusive findings on diagnostic imaging of breast: Secondary | ICD-10-CM

## 2012-03-20 DIAGNOSIS — H6520 Chronic serous otitis media, unspecified ear: Secondary | ICD-10-CM | POA: Diagnosis not present

## 2012-03-20 DIAGNOSIS — Z438 Encounter for attention to other artificial openings: Secondary | ICD-10-CM | POA: Diagnosis not present

## 2012-03-26 ENCOUNTER — Ambulatory Visit
Admission: RE | Admit: 2012-03-26 | Discharge: 2012-03-26 | Disposition: A | Discharge status: Home / Self Care | Payer: BC Managed Care – PPO | Source: Ambulatory Visit | Attending: Internal Medicine | Admitting: Internal Medicine

## 2012-03-26 DIAGNOSIS — R928 Other abnormal and inconclusive findings on diagnostic imaging of breast: Secondary | ICD-10-CM

## 2012-04-09 DIAGNOSIS — Z6830 Body mass index (BMI) 30.0-30.9, adult: Secondary | ICD-10-CM | POA: Diagnosis not present

## 2012-04-09 DIAGNOSIS — E7800 Pure hypercholesterolemia, unspecified: Secondary | ICD-10-CM | POA: Diagnosis not present

## 2012-04-09 DIAGNOSIS — J309 Allergic rhinitis, unspecified: Secondary | ICD-10-CM | POA: Diagnosis not present

## 2012-04-09 DIAGNOSIS — K219 Gastro-esophageal reflux disease without esophagitis: Secondary | ICD-10-CM | POA: Diagnosis not present

## 2012-04-09 DIAGNOSIS — I10 Essential (primary) hypertension: Secondary | ICD-10-CM | POA: Diagnosis not present

## 2012-04-23 DIAGNOSIS — Z23 Encounter for immunization: Secondary | ICD-10-CM | POA: Diagnosis not present

## 2012-04-23 DIAGNOSIS — K219 Gastro-esophageal reflux disease without esophagitis: Secondary | ICD-10-CM | POA: Diagnosis not present

## 2012-04-23 DIAGNOSIS — Z6836 Body mass index (BMI) 36.0-36.9, adult: Secondary | ICD-10-CM | POA: Diagnosis not present

## 2012-04-23 DIAGNOSIS — I10 Essential (primary) hypertension: Secondary | ICD-10-CM | POA: Diagnosis not present

## 2012-04-23 DIAGNOSIS — M159 Polyosteoarthritis, unspecified: Secondary | ICD-10-CM | POA: Diagnosis not present

## 2012-05-11 DIAGNOSIS — D809 Immunodeficiency with predominantly antibody defects, unspecified: Secondary | ICD-10-CM | POA: Diagnosis not present

## 2012-05-11 DIAGNOSIS — R059 Cough, unspecified: Secondary | ICD-10-CM | POA: Diagnosis not present

## 2012-05-11 DIAGNOSIS — C9110 Chronic lymphocytic leukemia of B-cell type not having achieved remission: Secondary | ICD-10-CM | POA: Diagnosis not present

## 2012-05-11 DIAGNOSIS — Z09 Encounter for follow-up examination after completed treatment for conditions other than malignant neoplasm: Secondary | ICD-10-CM | POA: Diagnosis not present

## 2012-05-11 DIAGNOSIS — R05 Cough: Secondary | ICD-10-CM | POA: Diagnosis not present

## 2012-05-25 DIAGNOSIS — M899 Disorder of bone, unspecified: Secondary | ICD-10-CM | POA: Diagnosis not present

## 2012-05-25 DIAGNOSIS — Z13820 Encounter for screening for osteoporosis: Secondary | ICD-10-CM | POA: Diagnosis not present

## 2012-06-18 DIAGNOSIS — Z6836 Body mass index (BMI) 36.0-36.9, adult: Secondary | ICD-10-CM | POA: Diagnosis not present

## 2012-06-18 DIAGNOSIS — E8881 Metabolic syndrome: Secondary | ICD-10-CM | POA: Diagnosis not present

## 2012-07-16 DIAGNOSIS — M810 Age-related osteoporosis without current pathological fracture: Secondary | ICD-10-CM | POA: Diagnosis not present

## 2012-07-16 DIAGNOSIS — Z6836 Body mass index (BMI) 36.0-36.9, adult: Secondary | ICD-10-CM | POA: Diagnosis not present

## 2012-07-16 DIAGNOSIS — M159 Polyosteoarthritis, unspecified: Secondary | ICD-10-CM | POA: Diagnosis not present

## 2012-07-16 DIAGNOSIS — I10 Essential (primary) hypertension: Secondary | ICD-10-CM | POA: Diagnosis not present

## 2012-07-27 DIAGNOSIS — J40 Bronchitis, not specified as acute or chronic: Secondary | ICD-10-CM | POA: Diagnosis not present

## 2012-07-27 DIAGNOSIS — R509 Fever, unspecified: Secondary | ICD-10-CM | POA: Diagnosis not present

## 2012-07-27 DIAGNOSIS — D696 Thrombocytopenia, unspecified: Secondary | ICD-10-CM | POA: Diagnosis not present

## 2012-07-27 DIAGNOSIS — F17200 Nicotine dependence, unspecified, uncomplicated: Secondary | ICD-10-CM | POA: Diagnosis not present

## 2012-07-27 DIAGNOSIS — R110 Nausea: Secondary | ICD-10-CM | POA: Diagnosis not present

## 2012-07-27 DIAGNOSIS — Z856 Personal history of leukemia: Secondary | ICD-10-CM | POA: Diagnosis not present

## 2012-08-06 DIAGNOSIS — L989 Disorder of the skin and subcutaneous tissue, unspecified: Secondary | ICD-10-CM | POA: Diagnosis not present

## 2012-08-06 DIAGNOSIS — S31109A Unspecified open wound of abdominal wall, unspecified quadrant without penetration into peritoneal cavity, initial encounter: Secondary | ICD-10-CM | POA: Diagnosis not present

## 2012-08-06 DIAGNOSIS — C9110 Chronic lymphocytic leukemia of B-cell type not having achieved remission: Secondary | ICD-10-CM | POA: Diagnosis not present

## 2012-08-12 DIAGNOSIS — Z6836 Body mass index (BMI) 36.0-36.9, adult: Secondary | ICD-10-CM | POA: Diagnosis not present

## 2012-08-12 DIAGNOSIS — Z79899 Other long term (current) drug therapy: Secondary | ICD-10-CM | POA: Diagnosis not present

## 2012-08-12 DIAGNOSIS — M159 Polyosteoarthritis, unspecified: Secondary | ICD-10-CM | POA: Diagnosis not present

## 2012-08-12 DIAGNOSIS — I10 Essential (primary) hypertension: Secondary | ICD-10-CM | POA: Diagnosis not present

## 2012-08-12 DIAGNOSIS — M810 Age-related osteoporosis without current pathological fracture: Secondary | ICD-10-CM | POA: Diagnosis not present

## 2012-08-12 DIAGNOSIS — E781 Pure hyperglyceridemia: Secondary | ICD-10-CM | POA: Diagnosis not present

## 2012-08-13 DIAGNOSIS — S31109A Unspecified open wound of abdominal wall, unspecified quadrant without penetration into peritoneal cavity, initial encounter: Secondary | ICD-10-CM | POA: Diagnosis not present

## 2012-08-27 DIAGNOSIS — R05 Cough: Secondary | ICD-10-CM | POA: Diagnosis not present

## 2012-08-27 DIAGNOSIS — D809 Immunodeficiency with predominantly antibody defects, unspecified: Secondary | ICD-10-CM | POA: Diagnosis not present

## 2012-08-27 DIAGNOSIS — Z5111 Encounter for antineoplastic chemotherapy: Secondary | ICD-10-CM | POA: Diagnosis not present

## 2012-08-27 DIAGNOSIS — R059 Cough, unspecified: Secondary | ICD-10-CM | POA: Diagnosis not present

## 2012-08-27 DIAGNOSIS — C9110 Chronic lymphocytic leukemia of B-cell type not having achieved remission: Secondary | ICD-10-CM | POA: Diagnosis not present

## 2012-09-09 DIAGNOSIS — Z6837 Body mass index (BMI) 37.0-37.9, adult: Secondary | ICD-10-CM | POA: Diagnosis not present

## 2012-09-09 DIAGNOSIS — M159 Polyosteoarthritis, unspecified: Secondary | ICD-10-CM | POA: Diagnosis not present

## 2012-09-09 DIAGNOSIS — I10 Essential (primary) hypertension: Secondary | ICD-10-CM | POA: Diagnosis not present

## 2012-09-09 DIAGNOSIS — K219 Gastro-esophageal reflux disease without esophagitis: Secondary | ICD-10-CM | POA: Diagnosis not present

## 2012-09-25 DIAGNOSIS — H2510 Age-related nuclear cataract, unspecified eye: Secondary | ICD-10-CM | POA: Diagnosis not present

## 2012-09-30 DIAGNOSIS — S31109A Unspecified open wound of abdominal wall, unspecified quadrant without penetration into peritoneal cavity, initial encounter: Secondary | ICD-10-CM | POA: Diagnosis not present

## 2012-10-21 DIAGNOSIS — E8881 Metabolic syndrome: Secondary | ICD-10-CM | POA: Diagnosis not present

## 2012-10-21 DIAGNOSIS — M159 Polyosteoarthritis, unspecified: Secondary | ICD-10-CM | POA: Diagnosis not present

## 2012-10-21 DIAGNOSIS — Z6830 Body mass index (BMI) 30.0-30.9, adult: Secondary | ICD-10-CM | POA: Diagnosis not present

## 2012-10-21 DIAGNOSIS — K219 Gastro-esophageal reflux disease without esophagitis: Secondary | ICD-10-CM | POA: Diagnosis not present

## 2012-11-11 DIAGNOSIS — R05 Cough: Secondary | ICD-10-CM | POA: Diagnosis not present

## 2012-11-11 DIAGNOSIS — C9110 Chronic lymphocytic leukemia of B-cell type not having achieved remission: Secondary | ICD-10-CM | POA: Diagnosis not present

## 2012-11-11 DIAGNOSIS — R059 Cough, unspecified: Secondary | ICD-10-CM | POA: Diagnosis not present

## 2012-11-11 DIAGNOSIS — D809 Immunodeficiency with predominantly antibody defects, unspecified: Secondary | ICD-10-CM | POA: Diagnosis not present

## 2012-11-24 DIAGNOSIS — Z438 Encounter for attention to other artificial openings: Secondary | ICD-10-CM | POA: Diagnosis not present

## 2012-11-24 DIAGNOSIS — H65199 Other acute nonsuppurative otitis media, unspecified ear: Secondary | ICD-10-CM | POA: Diagnosis not present

## 2012-12-02 DIAGNOSIS — D809 Immunodeficiency with predominantly antibody defects, unspecified: Secondary | ICD-10-CM | POA: Diagnosis not present

## 2012-12-02 DIAGNOSIS — R059 Cough, unspecified: Secondary | ICD-10-CM | POA: Diagnosis not present

## 2012-12-02 DIAGNOSIS — C9110 Chronic lymphocytic leukemia of B-cell type not having achieved remission: Secondary | ICD-10-CM | POA: Diagnosis not present

## 2012-12-02 DIAGNOSIS — R05 Cough: Secondary | ICD-10-CM | POA: Diagnosis not present

## 2012-12-07 DIAGNOSIS — R05 Cough: Secondary | ICD-10-CM | POA: Diagnosis not present

## 2012-12-07 DIAGNOSIS — R059 Cough, unspecified: Secondary | ICD-10-CM | POA: Diagnosis not present

## 2012-12-07 DIAGNOSIS — D809 Immunodeficiency with predominantly antibody defects, unspecified: Secondary | ICD-10-CM | POA: Diagnosis not present

## 2012-12-07 DIAGNOSIS — C9110 Chronic lymphocytic leukemia of B-cell type not having achieved remission: Secondary | ICD-10-CM | POA: Diagnosis not present

## 2012-12-09 DIAGNOSIS — K219 Gastro-esophageal reflux disease without esophagitis: Secondary | ICD-10-CM | POA: Diagnosis not present

## 2012-12-09 DIAGNOSIS — M159 Polyosteoarthritis, unspecified: Secondary | ICD-10-CM | POA: Diagnosis not present

## 2012-12-09 DIAGNOSIS — I10 Essential (primary) hypertension: Secondary | ICD-10-CM | POA: Diagnosis not present

## 2012-12-09 DIAGNOSIS — E781 Pure hyperglyceridemia: Secondary | ICD-10-CM | POA: Diagnosis not present

## 2012-12-21 DIAGNOSIS — Z09 Encounter for follow-up examination after completed treatment for conditions other than malignant neoplasm: Secondary | ICD-10-CM | POA: Diagnosis not present

## 2012-12-21 DIAGNOSIS — C9110 Chronic lymphocytic leukemia of B-cell type not having achieved remission: Secondary | ICD-10-CM | POA: Diagnosis not present

## 2012-12-21 DIAGNOSIS — R059 Cough, unspecified: Secondary | ICD-10-CM | POA: Diagnosis not present

## 2012-12-21 DIAGNOSIS — R05 Cough: Secondary | ICD-10-CM | POA: Diagnosis not present

## 2012-12-21 DIAGNOSIS — D809 Immunodeficiency with predominantly antibody defects, unspecified: Secondary | ICD-10-CM | POA: Diagnosis not present

## 2013-01-04 DIAGNOSIS — R059 Cough, unspecified: Secondary | ICD-10-CM | POA: Diagnosis not present

## 2013-01-04 DIAGNOSIS — R05 Cough: Secondary | ICD-10-CM | POA: Diagnosis not present

## 2013-01-04 DIAGNOSIS — D809 Immunodeficiency with predominantly antibody defects, unspecified: Secondary | ICD-10-CM | POA: Diagnosis not present

## 2013-01-04 DIAGNOSIS — C9110 Chronic lymphocytic leukemia of B-cell type not having achieved remission: Secondary | ICD-10-CM | POA: Diagnosis not present

## 2013-02-01 DIAGNOSIS — Z23 Encounter for immunization: Secondary | ICD-10-CM | POA: Diagnosis not present

## 2013-02-01 DIAGNOSIS — R05 Cough: Secondary | ICD-10-CM | POA: Diagnosis not present

## 2013-02-01 DIAGNOSIS — Z09 Encounter for follow-up examination after completed treatment for conditions other than malignant neoplasm: Secondary | ICD-10-CM | POA: Diagnosis not present

## 2013-02-01 DIAGNOSIS — R059 Cough, unspecified: Secondary | ICD-10-CM | POA: Diagnosis not present

## 2013-02-01 DIAGNOSIS — D809 Immunodeficiency with predominantly antibody defects, unspecified: Secondary | ICD-10-CM | POA: Diagnosis not present

## 2013-02-01 DIAGNOSIS — C9110 Chronic lymphocytic leukemia of B-cell type not having achieved remission: Secondary | ICD-10-CM | POA: Diagnosis not present

## 2013-02-03 DIAGNOSIS — M25569 Pain in unspecified knee: Secondary | ICD-10-CM | POA: Diagnosis not present

## 2013-02-04 DIAGNOSIS — E8881 Metabolic syndrome: Secondary | ICD-10-CM | POA: Diagnosis not present

## 2013-02-04 DIAGNOSIS — Z1331 Encounter for screening for depression: Secondary | ICD-10-CM | POA: Diagnosis not present

## 2013-02-04 DIAGNOSIS — Z6836 Body mass index (BMI) 36.0-36.9, adult: Secondary | ICD-10-CM | POA: Diagnosis not present

## 2013-02-04 DIAGNOSIS — Z9181 History of falling: Secondary | ICD-10-CM | POA: Diagnosis not present

## 2013-02-04 DIAGNOSIS — M159 Polyosteoarthritis, unspecified: Secondary | ICD-10-CM | POA: Diagnosis not present

## 2013-02-04 DIAGNOSIS — K219 Gastro-esophageal reflux disease without esophagitis: Secondary | ICD-10-CM | POA: Diagnosis not present

## 2013-03-01 DIAGNOSIS — Z09 Encounter for follow-up examination after completed treatment for conditions other than malignant neoplasm: Secondary | ICD-10-CM | POA: Diagnosis not present

## 2013-03-01 DIAGNOSIS — R05 Cough: Secondary | ICD-10-CM | POA: Diagnosis not present

## 2013-03-01 DIAGNOSIS — C9110 Chronic lymphocytic leukemia of B-cell type not having achieved remission: Secondary | ICD-10-CM | POA: Diagnosis not present

## 2013-03-01 DIAGNOSIS — D809 Immunodeficiency with predominantly antibody defects, unspecified: Secondary | ICD-10-CM | POA: Diagnosis not present

## 2013-03-30 DIAGNOSIS — Z1231 Encounter for screening mammogram for malignant neoplasm of breast: Secondary | ICD-10-CM | POA: Diagnosis not present

## 2013-04-08 DIAGNOSIS — J209 Acute bronchitis, unspecified: Secondary | ICD-10-CM | POA: Diagnosis not present

## 2013-04-08 DIAGNOSIS — K219 Gastro-esophageal reflux disease without esophagitis: Secondary | ICD-10-CM | POA: Diagnosis not present

## 2013-04-08 DIAGNOSIS — I10 Essential (primary) hypertension: Secondary | ICD-10-CM | POA: Diagnosis not present

## 2013-04-08 DIAGNOSIS — M159 Polyosteoarthritis, unspecified: Secondary | ICD-10-CM | POA: Diagnosis not present

## 2013-05-03 DIAGNOSIS — R059 Cough, unspecified: Secondary | ICD-10-CM | POA: Diagnosis not present

## 2013-05-03 DIAGNOSIS — R05 Cough: Secondary | ICD-10-CM | POA: Diagnosis not present

## 2013-05-03 DIAGNOSIS — D809 Immunodeficiency with predominantly antibody defects, unspecified: Secondary | ICD-10-CM | POA: Diagnosis not present

## 2013-05-03 DIAGNOSIS — Z09 Encounter for follow-up examination after completed treatment for conditions other than malignant neoplasm: Secondary | ICD-10-CM | POA: Diagnosis not present

## 2013-05-03 DIAGNOSIS — C9110 Chronic lymphocytic leukemia of B-cell type not having achieved remission: Secondary | ICD-10-CM | POA: Diagnosis not present

## 2013-05-05 DIAGNOSIS — IMO0002 Reserved for concepts with insufficient information to code with codable children: Secondary | ICD-10-CM | POA: Diagnosis not present

## 2013-05-05 DIAGNOSIS — R161 Splenomegaly, not elsewhere classified: Secondary | ICD-10-CM | POA: Diagnosis not present

## 2013-05-05 DIAGNOSIS — R1031 Right lower quadrant pain: Secondary | ICD-10-CM | POA: Diagnosis not present

## 2013-05-05 DIAGNOSIS — R599 Enlarged lymph nodes, unspecified: Secondary | ICD-10-CM | POA: Diagnosis not present

## 2013-05-05 DIAGNOSIS — C9110 Chronic lymphocytic leukemia of B-cell type not having achieved remission: Secondary | ICD-10-CM | POA: Diagnosis not present

## 2013-05-05 DIAGNOSIS — K5730 Diverticulosis of large intestine without perforation or abscess without bleeding: Secondary | ICD-10-CM | POA: Diagnosis not present

## 2013-06-03 DIAGNOSIS — I10 Essential (primary) hypertension: Secondary | ICD-10-CM | POA: Diagnosis not present

## 2013-06-03 DIAGNOSIS — E781 Pure hyperglyceridemia: Secondary | ICD-10-CM | POA: Diagnosis not present

## 2013-06-03 DIAGNOSIS — M159 Polyosteoarthritis, unspecified: Secondary | ICD-10-CM | POA: Diagnosis not present

## 2013-06-03 DIAGNOSIS — E8881 Metabolic syndrome: Secondary | ICD-10-CM | POA: Diagnosis not present

## 2013-07-12 DIAGNOSIS — D809 Immunodeficiency with predominantly antibody defects, unspecified: Secondary | ICD-10-CM | POA: Diagnosis not present

## 2013-07-12 DIAGNOSIS — R300 Dysuria: Secondary | ICD-10-CM | POA: Diagnosis not present

## 2013-07-12 DIAGNOSIS — D649 Anemia, unspecified: Secondary | ICD-10-CM | POA: Diagnosis not present

## 2013-07-12 DIAGNOSIS — R059 Cough, unspecified: Secondary | ICD-10-CM | POA: Diagnosis not present

## 2013-07-12 DIAGNOSIS — C9110 Chronic lymphocytic leukemia of B-cell type not having achieved remission: Secondary | ICD-10-CM | POA: Diagnosis not present

## 2013-07-12 DIAGNOSIS — R05 Cough: Secondary | ICD-10-CM | POA: Diagnosis not present

## 2013-07-29 DIAGNOSIS — E8881 Metabolic syndrome: Secondary | ICD-10-CM | POA: Diagnosis not present

## 2013-07-29 DIAGNOSIS — I10 Essential (primary) hypertension: Secondary | ICD-10-CM | POA: Diagnosis not present

## 2013-07-29 DIAGNOSIS — J309 Allergic rhinitis, unspecified: Secondary | ICD-10-CM | POA: Diagnosis not present

## 2013-07-29 DIAGNOSIS — M159 Polyosteoarthritis, unspecified: Secondary | ICD-10-CM | POA: Diagnosis not present

## 2013-08-05 DIAGNOSIS — R319 Hematuria, unspecified: Secondary | ICD-10-CM | POA: Diagnosis not present

## 2013-08-05 DIAGNOSIS — R310 Gross hematuria: Secondary | ICD-10-CM | POA: Diagnosis not present

## 2013-08-05 DIAGNOSIS — R3129 Other microscopic hematuria: Secondary | ICD-10-CM | POA: Diagnosis not present

## 2013-08-26 DIAGNOSIS — R3129 Other microscopic hematuria: Secondary | ICD-10-CM | POA: Diagnosis not present

## 2013-09-23 DIAGNOSIS — I10 Essential (primary) hypertension: Secondary | ICD-10-CM | POA: Diagnosis not present

## 2013-09-23 DIAGNOSIS — M810 Age-related osteoporosis without current pathological fracture: Secondary | ICD-10-CM | POA: Diagnosis not present

## 2013-09-23 DIAGNOSIS — C9112 Chronic lymphocytic leukemia of B-cell type in relapse: Secondary | ICD-10-CM | POA: Diagnosis not present

## 2013-09-23 DIAGNOSIS — M159 Polyosteoarthritis, unspecified: Secondary | ICD-10-CM | POA: Diagnosis not present

## 2013-09-23 DIAGNOSIS — E8881 Metabolic syndrome: Secondary | ICD-10-CM | POA: Diagnosis not present

## 2013-09-23 DIAGNOSIS — E781 Pure hyperglyceridemia: Secondary | ICD-10-CM | POA: Diagnosis not present

## 2013-10-11 DIAGNOSIS — R05 Cough: Secondary | ICD-10-CM | POA: Diagnosis not present

## 2013-10-11 DIAGNOSIS — Z09 Encounter for follow-up examination after completed treatment for conditions other than malignant neoplasm: Secondary | ICD-10-CM | POA: Diagnosis not present

## 2013-10-11 DIAGNOSIS — C9110 Chronic lymphocytic leukemia of B-cell type not having achieved remission: Secondary | ICD-10-CM | POA: Diagnosis not present

## 2013-10-11 DIAGNOSIS — D809 Immunodeficiency with predominantly antibody defects, unspecified: Secondary | ICD-10-CM | POA: Diagnosis not present

## 2013-10-11 DIAGNOSIS — R059 Cough, unspecified: Secondary | ICD-10-CM | POA: Diagnosis not present

## 2013-10-22 DIAGNOSIS — H6980 Other specified disorders of Eustachian tube, unspecified ear: Secondary | ICD-10-CM | POA: Diagnosis not present

## 2013-11-18 DIAGNOSIS — E8881 Metabolic syndrome: Secondary | ICD-10-CM | POA: Diagnosis not present

## 2013-11-18 DIAGNOSIS — M159 Polyosteoarthritis, unspecified: Secondary | ICD-10-CM | POA: Diagnosis not present

## 2013-11-18 DIAGNOSIS — M810 Age-related osteoporosis without current pathological fracture: Secondary | ICD-10-CM | POA: Diagnosis not present

## 2013-11-18 DIAGNOSIS — E781 Pure hyperglyceridemia: Secondary | ICD-10-CM | POA: Diagnosis not present

## 2014-01-10 DIAGNOSIS — C9110 Chronic lymphocytic leukemia of B-cell type not having achieved remission: Secondary | ICD-10-CM | POA: Diagnosis not present

## 2014-01-13 DIAGNOSIS — E8881 Metabolic syndrome: Secondary | ICD-10-CM | POA: Diagnosis not present

## 2014-01-13 DIAGNOSIS — M810 Age-related osteoporosis without current pathological fracture: Secondary | ICD-10-CM | POA: Diagnosis not present

## 2014-01-13 DIAGNOSIS — C9112 Chronic lymphocytic leukemia of B-cell type in relapse: Secondary | ICD-10-CM | POA: Diagnosis not present

## 2014-01-13 DIAGNOSIS — J209 Acute bronchitis, unspecified: Secondary | ICD-10-CM | POA: Diagnosis not present

## 2014-01-13 DIAGNOSIS — R600 Localized edema: Secondary | ICD-10-CM | POA: Diagnosis not present

## 2014-01-26 DIAGNOSIS — C9112 Chronic lymphocytic leukemia of B-cell type in relapse: Secondary | ICD-10-CM | POA: Diagnosis not present

## 2014-01-26 DIAGNOSIS — M25561 Pain in right knee: Secondary | ICD-10-CM | POA: Diagnosis not present

## 2014-01-26 DIAGNOSIS — I10 Essential (primary) hypertension: Secondary | ICD-10-CM | POA: Diagnosis not present

## 2014-01-26 DIAGNOSIS — M810 Age-related osteoporosis without current pathological fracture: Secondary | ICD-10-CM | POA: Diagnosis not present

## 2014-01-26 DIAGNOSIS — E781 Pure hyperglyceridemia: Secondary | ICD-10-CM | POA: Diagnosis not present

## 2014-01-26 DIAGNOSIS — Z720 Tobacco use: Secondary | ICD-10-CM | POA: Diagnosis not present

## 2014-01-26 DIAGNOSIS — E8881 Metabolic syndrome: Secondary | ICD-10-CM | POA: Diagnosis not present

## 2014-01-26 DIAGNOSIS — M159 Polyosteoarthritis, unspecified: Secondary | ICD-10-CM | POA: Diagnosis not present

## 2014-03-03 DIAGNOSIS — H2513 Age-related nuclear cataract, bilateral: Secondary | ICD-10-CM | POA: Diagnosis not present

## 2014-03-03 DIAGNOSIS — H34823 Venous engorgement, bilateral: Secondary | ICD-10-CM | POA: Diagnosis not present

## 2014-03-09 DIAGNOSIS — M159 Polyosteoarthritis, unspecified: Secondary | ICD-10-CM | POA: Diagnosis not present

## 2014-03-09 DIAGNOSIS — C91Z2 Other lymphoid leukemia, in relapse: Secondary | ICD-10-CM | POA: Diagnosis not present

## 2014-03-09 DIAGNOSIS — E8881 Metabolic syndrome: Secondary | ICD-10-CM | POA: Diagnosis not present

## 2014-03-09 DIAGNOSIS — M810 Age-related osteoporosis without current pathological fracture: Secondary | ICD-10-CM | POA: Diagnosis not present

## 2014-03-09 DIAGNOSIS — R600 Localized edema: Secondary | ICD-10-CM | POA: Diagnosis not present

## 2014-03-09 DIAGNOSIS — I10 Essential (primary) hypertension: Secondary | ICD-10-CM | POA: Diagnosis not present

## 2014-03-09 DIAGNOSIS — E781 Pure hyperglyceridemia: Secondary | ICD-10-CM | POA: Diagnosis not present

## 2014-03-09 DIAGNOSIS — K219 Gastro-esophageal reflux disease without esophagitis: Secondary | ICD-10-CM | POA: Diagnosis not present

## 2014-03-31 DIAGNOSIS — Z1231 Encounter for screening mammogram for malignant neoplasm of breast: Secondary | ICD-10-CM | POA: Diagnosis not present

## 2014-04-11 DIAGNOSIS — C9110 Chronic lymphocytic leukemia of B-cell type not having achieved remission: Secondary | ICD-10-CM | POA: Diagnosis not present

## 2014-04-11 DIAGNOSIS — Z23 Encounter for immunization: Secondary | ICD-10-CM | POA: Diagnosis not present

## 2014-05-03 DIAGNOSIS — E781 Pure hyperglyceridemia: Secondary | ICD-10-CM | POA: Diagnosis not present

## 2014-05-03 DIAGNOSIS — C91Z2 Other lymphoid leukemia, in relapse: Secondary | ICD-10-CM | POA: Diagnosis not present

## 2014-05-03 DIAGNOSIS — M810 Age-related osteoporosis without current pathological fracture: Secondary | ICD-10-CM | POA: Diagnosis not present

## 2014-05-03 DIAGNOSIS — M159 Polyosteoarthritis, unspecified: Secondary | ICD-10-CM | POA: Diagnosis not present

## 2014-05-03 DIAGNOSIS — K219 Gastro-esophageal reflux disease without esophagitis: Secondary | ICD-10-CM | POA: Diagnosis not present

## 2014-05-03 DIAGNOSIS — I10 Essential (primary) hypertension: Secondary | ICD-10-CM | POA: Diagnosis not present

## 2014-05-03 DIAGNOSIS — E8881 Metabolic syndrome: Secondary | ICD-10-CM | POA: Diagnosis not present

## 2014-05-03 DIAGNOSIS — R600 Localized edema: Secondary | ICD-10-CM | POA: Diagnosis not present

## 2014-05-03 DIAGNOSIS — Z720 Tobacco use: Secondary | ICD-10-CM | POA: Diagnosis not present

## 2014-05-03 DIAGNOSIS — Z6841 Body Mass Index (BMI) 40.0 and over, adult: Secondary | ICD-10-CM | POA: Diagnosis not present

## 2014-05-11 DIAGNOSIS — I10 Essential (primary) hypertension: Secondary | ICD-10-CM | POA: Diagnosis not present

## 2014-05-11 DIAGNOSIS — L732 Hidradenitis suppurativa: Secondary | ICD-10-CM | POA: Diagnosis not present

## 2014-05-11 DIAGNOSIS — K219 Gastro-esophageal reflux disease without esophagitis: Secondary | ICD-10-CM | POA: Diagnosis not present

## 2014-05-11 DIAGNOSIS — M159 Polyosteoarthritis, unspecified: Secondary | ICD-10-CM | POA: Diagnosis not present

## 2014-05-11 DIAGNOSIS — M810 Age-related osteoporosis without current pathological fracture: Secondary | ICD-10-CM | POA: Diagnosis not present

## 2014-05-11 DIAGNOSIS — C91Z2 Other lymphoid leukemia, in relapse: Secondary | ICD-10-CM | POA: Diagnosis not present

## 2014-05-11 DIAGNOSIS — R600 Localized edema: Secondary | ICD-10-CM | POA: Diagnosis not present

## 2014-05-11 DIAGNOSIS — E8881 Metabolic syndrome: Secondary | ICD-10-CM | POA: Diagnosis not present

## 2014-05-11 DIAGNOSIS — E781 Pure hyperglyceridemia: Secondary | ICD-10-CM | POA: Diagnosis not present

## 2014-05-11 DIAGNOSIS — Z720 Tobacco use: Secondary | ICD-10-CM | POA: Diagnosis not present

## 2014-05-11 DIAGNOSIS — Z6841 Body Mass Index (BMI) 40.0 and over, adult: Secondary | ICD-10-CM | POA: Diagnosis not present

## 2014-05-18 DIAGNOSIS — C91Z2 Other lymphoid leukemia, in relapse: Secondary | ICD-10-CM | POA: Diagnosis not present

## 2014-05-18 DIAGNOSIS — L732 Hidradenitis suppurativa: Secondary | ICD-10-CM | POA: Diagnosis not present

## 2014-05-18 DIAGNOSIS — M810 Age-related osteoporosis without current pathological fracture: Secondary | ICD-10-CM | POA: Diagnosis not present

## 2014-05-18 DIAGNOSIS — R600 Localized edema: Secondary | ICD-10-CM | POA: Diagnosis not present

## 2014-05-18 DIAGNOSIS — Z720 Tobacco use: Secondary | ICD-10-CM | POA: Diagnosis not present

## 2014-05-18 DIAGNOSIS — M159 Polyosteoarthritis, unspecified: Secondary | ICD-10-CM | POA: Diagnosis not present

## 2014-05-18 DIAGNOSIS — Z6841 Body Mass Index (BMI) 40.0 and over, adult: Secondary | ICD-10-CM | POA: Diagnosis not present

## 2014-05-18 DIAGNOSIS — K219 Gastro-esophageal reflux disease without esophagitis: Secondary | ICD-10-CM | POA: Diagnosis not present

## 2014-05-18 DIAGNOSIS — I10 Essential (primary) hypertension: Secondary | ICD-10-CM | POA: Diagnosis not present

## 2014-05-18 DIAGNOSIS — E8881 Metabolic syndrome: Secondary | ICD-10-CM | POA: Diagnosis not present

## 2014-05-18 DIAGNOSIS — E781 Pure hyperglyceridemia: Secondary | ICD-10-CM | POA: Diagnosis not present

## 2014-05-18 DIAGNOSIS — Z1389 Encounter for screening for other disorder: Secondary | ICD-10-CM | POA: Diagnosis not present

## 2014-05-31 DIAGNOSIS — C91Z2 Other lymphoid leukemia, in relapse: Secondary | ICD-10-CM | POA: Diagnosis not present

## 2014-05-31 DIAGNOSIS — R600 Localized edema: Secondary | ICD-10-CM | POA: Diagnosis not present

## 2014-05-31 DIAGNOSIS — E8881 Metabolic syndrome: Secondary | ICD-10-CM | POA: Diagnosis not present

## 2014-05-31 DIAGNOSIS — Z13820 Encounter for screening for osteoporosis: Secondary | ICD-10-CM | POA: Diagnosis not present

## 2014-05-31 DIAGNOSIS — M810 Age-related osteoporosis without current pathological fracture: Secondary | ICD-10-CM | POA: Diagnosis not present

## 2014-05-31 DIAGNOSIS — Z720 Tobacco use: Secondary | ICD-10-CM | POA: Diagnosis not present

## 2014-05-31 DIAGNOSIS — M159 Polyosteoarthritis, unspecified: Secondary | ICD-10-CM | POA: Diagnosis not present

## 2014-05-31 DIAGNOSIS — I10 Essential (primary) hypertension: Secondary | ICD-10-CM | POA: Diagnosis not present

## 2014-05-31 DIAGNOSIS — Z6841 Body Mass Index (BMI) 40.0 and over, adult: Secondary | ICD-10-CM | POA: Diagnosis not present

## 2014-05-31 DIAGNOSIS — E781 Pure hyperglyceridemia: Secondary | ICD-10-CM | POA: Diagnosis not present

## 2014-05-31 DIAGNOSIS — K219 Gastro-esophageal reflux disease without esophagitis: Secondary | ICD-10-CM | POA: Diagnosis not present

## 2014-06-08 DIAGNOSIS — M8589 Other specified disorders of bone density and structure, multiple sites: Secondary | ICD-10-CM | POA: Diagnosis not present

## 2014-06-08 DIAGNOSIS — Z13820 Encounter for screening for osteoporosis: Secondary | ICD-10-CM | POA: Diagnosis not present

## 2014-06-28 DIAGNOSIS — Z79899 Other long term (current) drug therapy: Secondary | ICD-10-CM | POA: Diagnosis not present

## 2014-07-11 DIAGNOSIS — C9110 Chronic lymphocytic leukemia of B-cell type not having achieved remission: Secondary | ICD-10-CM | POA: Diagnosis not present

## 2014-07-19 DIAGNOSIS — C9110 Chronic lymphocytic leukemia of B-cell type not having achieved remission: Secondary | ICD-10-CM | POA: Diagnosis not present

## 2014-07-19 DIAGNOSIS — L03115 Cellulitis of right lower limb: Secondary | ICD-10-CM | POA: Diagnosis not present

## 2014-07-19 DIAGNOSIS — L03116 Cellulitis of left lower limb: Secondary | ICD-10-CM | POA: Diagnosis not present

## 2014-07-25 DIAGNOSIS — C9110 Chronic lymphocytic leukemia of B-cell type not having achieved remission: Secondary | ICD-10-CM | POA: Diagnosis not present

## 2014-07-26 DIAGNOSIS — C91Z2 Other lymphoid leukemia, in relapse: Secondary | ICD-10-CM | POA: Diagnosis not present

## 2014-07-26 DIAGNOSIS — E781 Pure hyperglyceridemia: Secondary | ICD-10-CM | POA: Diagnosis not present

## 2014-07-26 DIAGNOSIS — R600 Localized edema: Secondary | ICD-10-CM | POA: Diagnosis not present

## 2014-07-26 DIAGNOSIS — Z720 Tobacco use: Secondary | ICD-10-CM | POA: Diagnosis not present

## 2014-07-26 DIAGNOSIS — M159 Polyosteoarthritis, unspecified: Secondary | ICD-10-CM | POA: Diagnosis not present

## 2014-07-26 DIAGNOSIS — Z6839 Body mass index (BMI) 39.0-39.9, adult: Secondary | ICD-10-CM | POA: Diagnosis not present

## 2014-07-26 DIAGNOSIS — I10 Essential (primary) hypertension: Secondary | ICD-10-CM | POA: Diagnosis not present

## 2014-07-26 DIAGNOSIS — M810 Age-related osteoporosis without current pathological fracture: Secondary | ICD-10-CM | POA: Diagnosis not present

## 2014-07-26 DIAGNOSIS — K219 Gastro-esophageal reflux disease without esophagitis: Secondary | ICD-10-CM | POA: Diagnosis not present

## 2014-07-26 DIAGNOSIS — E8881 Metabolic syndrome: Secondary | ICD-10-CM | POA: Diagnosis not present

## 2014-08-07 DIAGNOSIS — R112 Nausea with vomiting, unspecified: Secondary | ICD-10-CM | POA: Diagnosis not present

## 2014-08-07 DIAGNOSIS — N179 Acute kidney failure, unspecified: Secondary | ICD-10-CM | POA: Diagnosis not present

## 2014-08-07 DIAGNOSIS — E876 Hypokalemia: Secondary | ICD-10-CM | POA: Diagnosis not present

## 2014-08-08 DIAGNOSIS — C9110 Chronic lymphocytic leukemia of B-cell type not having achieved remission: Secondary | ICD-10-CM | POA: Diagnosis not present

## 2014-08-08 DIAGNOSIS — I83023 Varicose veins of left lower extremity with ulcer of ankle: Secondary | ICD-10-CM | POA: Diagnosis not present

## 2014-08-08 DIAGNOSIS — E669 Obesity, unspecified: Secondary | ICD-10-CM | POA: Diagnosis not present

## 2014-08-08 DIAGNOSIS — Z6839 Body mass index (BMI) 39.0-39.9, adult: Secondary | ICD-10-CM | POA: Diagnosis not present

## 2014-08-08 DIAGNOSIS — M7989 Other specified soft tissue disorders: Secondary | ICD-10-CM | POA: Diagnosis not present

## 2014-08-09 DIAGNOSIS — I83029 Varicose veins of left lower extremity with ulcer of unspecified site: Secondary | ICD-10-CM | POA: Diagnosis not present

## 2014-08-09 DIAGNOSIS — D72829 Elevated white blood cell count, unspecified: Secondary | ICD-10-CM | POA: Diagnosis not present

## 2014-08-09 DIAGNOSIS — D696 Thrombocytopenia, unspecified: Secondary | ICD-10-CM | POA: Diagnosis not present

## 2014-08-09 DIAGNOSIS — Z807 Family history of other malignant neoplasms of lymphoid, hematopoietic and related tissues: Secondary | ICD-10-CM | POA: Diagnosis not present

## 2014-08-09 DIAGNOSIS — Z90710 Acquired absence of both cervix and uterus: Secondary | ICD-10-CM | POA: Diagnosis not present

## 2014-08-09 DIAGNOSIS — D6481 Anemia due to antineoplastic chemotherapy: Secondary | ICD-10-CM | POA: Diagnosis not present

## 2014-08-09 DIAGNOSIS — R404 Transient alteration of awareness: Secondary | ICD-10-CM | POA: Diagnosis not present

## 2014-08-09 DIAGNOSIS — Z5329 Procedure and treatment not carried out because of patient's decision for other reasons: Secondary | ICD-10-CM | POA: Diagnosis not present

## 2014-08-09 DIAGNOSIS — R112 Nausea with vomiting, unspecified: Secondary | ICD-10-CM | POA: Diagnosis not present

## 2014-08-09 DIAGNOSIS — B3781 Candidal esophagitis: Secondary | ICD-10-CM | POA: Diagnosis not present

## 2014-08-09 DIAGNOSIS — R197 Diarrhea, unspecified: Secondary | ICD-10-CM | POA: Diagnosis not present

## 2014-08-09 DIAGNOSIS — Z9049 Acquired absence of other specified parts of digestive tract: Secondary | ICD-10-CM | POA: Diagnosis present

## 2014-08-09 DIAGNOSIS — L03116 Cellulitis of left lower limb: Secondary | ICD-10-CM | POA: Diagnosis not present

## 2014-08-09 DIAGNOSIS — R531 Weakness: Secondary | ICD-10-CM | POA: Diagnosis not present

## 2014-08-09 DIAGNOSIS — D61818 Other pancytopenia: Secondary | ICD-10-CM | POA: Diagnosis not present

## 2014-08-09 DIAGNOSIS — A419 Sepsis, unspecified organism: Secondary | ICD-10-CM | POA: Diagnosis not present

## 2014-08-09 DIAGNOSIS — T451X5A Adverse effect of antineoplastic and immunosuppressive drugs, initial encounter: Secondary | ICD-10-CM | POA: Diagnosis present

## 2014-08-09 DIAGNOSIS — Z6838 Body mass index (BMI) 38.0-38.9, adult: Secondary | ICD-10-CM | POA: Diagnosis not present

## 2014-08-09 DIAGNOSIS — Z882 Allergy status to sulfonamides status: Secondary | ICD-10-CM | POA: Diagnosis not present

## 2014-08-09 DIAGNOSIS — N179 Acute kidney failure, unspecified: Secondary | ICD-10-CM | POA: Diagnosis not present

## 2014-08-09 DIAGNOSIS — C9110 Chronic lymphocytic leukemia of B-cell type not having achieved remission: Secondary | ICD-10-CM | POA: Diagnosis present

## 2014-08-09 DIAGNOSIS — R262 Difficulty in walking, not elsewhere classified: Secondary | ICD-10-CM | POA: Diagnosis not present

## 2014-08-09 DIAGNOSIS — A047 Enterocolitis due to Clostridium difficile: Secondary | ICD-10-CM | POA: Diagnosis present

## 2014-08-09 DIAGNOSIS — E876 Hypokalemia: Secondary | ICD-10-CM | POA: Diagnosis not present

## 2014-08-09 DIAGNOSIS — Z96653 Presence of artificial knee joint, bilateral: Secondary | ICD-10-CM | POA: Diagnosis present

## 2014-08-09 DIAGNOSIS — M1990 Unspecified osteoarthritis, unspecified site: Secondary | ICD-10-CM | POA: Diagnosis present

## 2014-08-09 DIAGNOSIS — K210 Gastro-esophageal reflux disease with esophagitis: Secondary | ICD-10-CM | POA: Diagnosis not present

## 2014-08-09 DIAGNOSIS — R1012 Left upper quadrant pain: Secondary | ICD-10-CM | POA: Diagnosis not present

## 2014-08-09 DIAGNOSIS — A09 Infectious gastroenteritis and colitis, unspecified: Secondary | ICD-10-CM | POA: Diagnosis not present

## 2014-08-09 DIAGNOSIS — E860 Dehydration: Secondary | ICD-10-CM | POA: Diagnosis not present

## 2014-08-09 DIAGNOSIS — A4189 Other specified sepsis: Secondary | ICD-10-CM | POA: Diagnosis not present

## 2014-08-09 DIAGNOSIS — C9510 Chronic leukemia of unspecified cell type not having achieved remission: Secondary | ICD-10-CM | POA: Diagnosis not present

## 2014-08-09 DIAGNOSIS — L97921 Non-pressure chronic ulcer of unspecified part of left lower leg limited to breakdown of skin: Secondary | ICD-10-CM | POA: Diagnosis not present

## 2014-08-09 DIAGNOSIS — R6520 Severe sepsis without septic shock: Secondary | ICD-10-CM | POA: Diagnosis not present

## 2014-08-09 DIAGNOSIS — R509 Fever, unspecified: Secondary | ICD-10-CM | POA: Diagnosis not present

## 2014-08-09 DIAGNOSIS — K2960 Other gastritis without bleeding: Secondary | ICD-10-CM | POA: Diagnosis not present

## 2014-08-09 DIAGNOSIS — Z87440 Personal history of urinary (tract) infections: Secondary | ICD-10-CM | POA: Diagnosis not present

## 2014-08-09 DIAGNOSIS — E46 Unspecified protein-calorie malnutrition: Secondary | ICD-10-CM | POA: Diagnosis not present

## 2014-08-24 DIAGNOSIS — I83028 Varicose veins of left lower extremity with ulcer other part of lower leg: Secondary | ICD-10-CM | POA: Diagnosis not present

## 2014-08-24 DIAGNOSIS — L97821 Non-pressure chronic ulcer of other part of left lower leg limited to breakdown of skin: Secondary | ICD-10-CM | POA: Diagnosis not present

## 2014-08-24 DIAGNOSIS — C9110 Chronic lymphocytic leukemia of B-cell type not having achieved remission: Secondary | ICD-10-CM | POA: Diagnosis not present

## 2014-08-24 DIAGNOSIS — A047 Enterocolitis due to Clostridium difficile: Secondary | ICD-10-CM | POA: Diagnosis not present

## 2014-08-25 DIAGNOSIS — I83028 Varicose veins of left lower extremity with ulcer other part of lower leg: Secondary | ICD-10-CM | POA: Diagnosis not present

## 2014-08-25 DIAGNOSIS — C9110 Chronic lymphocytic leukemia of B-cell type not having achieved remission: Secondary | ICD-10-CM | POA: Diagnosis not present

## 2014-08-25 DIAGNOSIS — A047 Enterocolitis due to Clostridium difficile: Secondary | ICD-10-CM | POA: Diagnosis not present

## 2014-08-25 DIAGNOSIS — L97821 Non-pressure chronic ulcer of other part of left lower leg limited to breakdown of skin: Secondary | ICD-10-CM | POA: Diagnosis not present

## 2014-08-26 DIAGNOSIS — A047 Enterocolitis due to Clostridium difficile: Secondary | ICD-10-CM | POA: Diagnosis not present

## 2014-08-26 DIAGNOSIS — I83028 Varicose veins of left lower extremity with ulcer other part of lower leg: Secondary | ICD-10-CM | POA: Diagnosis not present

## 2014-08-26 DIAGNOSIS — C9110 Chronic lymphocytic leukemia of B-cell type not having achieved remission: Secondary | ICD-10-CM | POA: Diagnosis not present

## 2014-08-26 DIAGNOSIS — L97821 Non-pressure chronic ulcer of other part of left lower leg limited to breakdown of skin: Secondary | ICD-10-CM | POA: Diagnosis not present

## 2014-08-29 DIAGNOSIS — L97821 Non-pressure chronic ulcer of other part of left lower leg limited to breakdown of skin: Secondary | ICD-10-CM | POA: Diagnosis not present

## 2014-08-29 DIAGNOSIS — C9110 Chronic lymphocytic leukemia of B-cell type not having achieved remission: Secondary | ICD-10-CM | POA: Diagnosis not present

## 2014-08-29 DIAGNOSIS — A047 Enterocolitis due to Clostridium difficile: Secondary | ICD-10-CM | POA: Diagnosis not present

## 2014-08-29 DIAGNOSIS — I83028 Varicose veins of left lower extremity with ulcer other part of lower leg: Secondary | ICD-10-CM | POA: Diagnosis not present

## 2014-08-30 DIAGNOSIS — R600 Localized edema: Secondary | ICD-10-CM | POA: Diagnosis not present

## 2014-08-30 DIAGNOSIS — K219 Gastro-esophageal reflux disease without esophagitis: Secondary | ICD-10-CM | POA: Diagnosis not present

## 2014-08-30 DIAGNOSIS — I639 Cerebral infarction, unspecified: Secondary | ICD-10-CM | POA: Diagnosis not present

## 2014-08-30 DIAGNOSIS — Z1389 Encounter for screening for other disorder: Secondary | ICD-10-CM | POA: Diagnosis not present

## 2014-08-30 DIAGNOSIS — Z720 Tobacco use: Secondary | ICD-10-CM | POA: Diagnosis not present

## 2014-08-30 DIAGNOSIS — M810 Age-related osteoporosis without current pathological fracture: Secondary | ICD-10-CM | POA: Diagnosis not present

## 2014-08-30 DIAGNOSIS — I10 Essential (primary) hypertension: Secondary | ICD-10-CM | POA: Diagnosis not present

## 2014-08-30 DIAGNOSIS — E781 Pure hyperglyceridemia: Secondary | ICD-10-CM | POA: Diagnosis not present

## 2014-08-30 DIAGNOSIS — M159 Polyosteoarthritis, unspecified: Secondary | ICD-10-CM | POA: Diagnosis not present

## 2014-08-30 DIAGNOSIS — E8881 Metabolic syndrome: Secondary | ICD-10-CM | POA: Diagnosis not present

## 2014-08-30 DIAGNOSIS — Z6836 Body mass index (BMI) 36.0-36.9, adult: Secondary | ICD-10-CM | POA: Diagnosis not present

## 2014-08-30 DIAGNOSIS — C91Z2 Other lymphoid leukemia, in relapse: Secondary | ICD-10-CM | POA: Diagnosis not present

## 2014-08-31 DIAGNOSIS — N186 End stage renal disease: Secondary | ICD-10-CM | POA: Diagnosis not present

## 2014-08-31 DIAGNOSIS — M1990 Unspecified osteoarthritis, unspecified site: Secondary | ICD-10-CM | POA: Diagnosis not present

## 2014-08-31 DIAGNOSIS — C9110 Chronic lymphocytic leukemia of B-cell type not having achieved remission: Secondary | ICD-10-CM | POA: Diagnosis not present

## 2014-08-31 DIAGNOSIS — L97811 Non-pressure chronic ulcer of other part of right lower leg limited to breakdown of skin: Secondary | ICD-10-CM | POA: Diagnosis not present

## 2014-08-31 DIAGNOSIS — L97212 Non-pressure chronic ulcer of right calf with fat layer exposed: Secondary | ICD-10-CM | POA: Diagnosis not present

## 2014-08-31 DIAGNOSIS — I872 Venous insufficiency (chronic) (peripheral): Secondary | ICD-10-CM | POA: Diagnosis not present

## 2014-08-31 DIAGNOSIS — K529 Noninfective gastroenteritis and colitis, unspecified: Secondary | ICD-10-CM | POA: Diagnosis not present

## 2014-08-31 DIAGNOSIS — L89322 Pressure ulcer of left buttock, stage 2: Secondary | ICD-10-CM | POA: Diagnosis not present

## 2014-08-31 DIAGNOSIS — L97222 Non-pressure chronic ulcer of left calf with fat layer exposed: Secondary | ICD-10-CM | POA: Diagnosis not present

## 2014-08-31 DIAGNOSIS — I87313 Chronic venous hypertension (idiopathic) with ulcer of bilateral lower extremity: Secondary | ICD-10-CM | POA: Diagnosis not present

## 2014-08-31 DIAGNOSIS — F17200 Nicotine dependence, unspecified, uncomplicated: Secondary | ICD-10-CM | POA: Diagnosis not present

## 2014-08-31 DIAGNOSIS — L97821 Non-pressure chronic ulcer of other part of left lower leg limited to breakdown of skin: Secondary | ICD-10-CM | POA: Diagnosis not present

## 2014-09-01 DIAGNOSIS — C9110 Chronic lymphocytic leukemia of B-cell type not having achieved remission: Secondary | ICD-10-CM | POA: Diagnosis not present

## 2014-09-01 DIAGNOSIS — I83028 Varicose veins of left lower extremity with ulcer other part of lower leg: Secondary | ICD-10-CM | POA: Diagnosis not present

## 2014-09-01 DIAGNOSIS — L97821 Non-pressure chronic ulcer of other part of left lower leg limited to breakdown of skin: Secondary | ICD-10-CM | POA: Diagnosis not present

## 2014-09-01 DIAGNOSIS — A047 Enterocolitis due to Clostridium difficile: Secondary | ICD-10-CM | POA: Diagnosis not present

## 2014-09-02 DIAGNOSIS — R0600 Dyspnea, unspecified: Secondary | ICD-10-CM | POA: Diagnosis not present

## 2014-09-02 DIAGNOSIS — I639 Cerebral infarction, unspecified: Secondary | ICD-10-CM | POA: Diagnosis not present

## 2014-09-02 DIAGNOSIS — C9110 Chronic lymphocytic leukemia of B-cell type not having achieved remission: Secondary | ICD-10-CM | POA: Diagnosis not present

## 2014-09-03 DIAGNOSIS — C9110 Chronic lymphocytic leukemia of B-cell type not having achieved remission: Secondary | ICD-10-CM | POA: Diagnosis not present

## 2014-09-03 DIAGNOSIS — A047 Enterocolitis due to Clostridium difficile: Secondary | ICD-10-CM | POA: Diagnosis not present

## 2014-09-03 DIAGNOSIS — I83028 Varicose veins of left lower extremity with ulcer other part of lower leg: Secondary | ICD-10-CM | POA: Diagnosis not present

## 2014-09-03 DIAGNOSIS — L97821 Non-pressure chronic ulcer of other part of left lower leg limited to breakdown of skin: Secondary | ICD-10-CM | POA: Diagnosis not present

## 2014-09-05 DIAGNOSIS — A047 Enterocolitis due to Clostridium difficile: Secondary | ICD-10-CM | POA: Diagnosis not present

## 2014-09-05 DIAGNOSIS — I83028 Varicose veins of left lower extremity with ulcer other part of lower leg: Secondary | ICD-10-CM | POA: Diagnosis not present

## 2014-09-05 DIAGNOSIS — C9110 Chronic lymphocytic leukemia of B-cell type not having achieved remission: Secondary | ICD-10-CM | POA: Diagnosis not present

## 2014-09-05 DIAGNOSIS — L97821 Non-pressure chronic ulcer of other part of left lower leg limited to breakdown of skin: Secondary | ICD-10-CM | POA: Diagnosis not present

## 2014-09-06 DIAGNOSIS — I83028 Varicose veins of left lower extremity with ulcer other part of lower leg: Secondary | ICD-10-CM | POA: Diagnosis not present

## 2014-09-06 DIAGNOSIS — C9110 Chronic lymphocytic leukemia of B-cell type not having achieved remission: Secondary | ICD-10-CM | POA: Diagnosis not present

## 2014-09-06 DIAGNOSIS — L97821 Non-pressure chronic ulcer of other part of left lower leg limited to breakdown of skin: Secondary | ICD-10-CM | POA: Diagnosis not present

## 2014-09-06 DIAGNOSIS — A047 Enterocolitis due to Clostridium difficile: Secondary | ICD-10-CM | POA: Diagnosis not present

## 2014-09-07 DIAGNOSIS — R300 Dysuria: Secondary | ICD-10-CM | POA: Diagnosis not present

## 2014-09-07 DIAGNOSIS — Z87440 Personal history of urinary (tract) infections: Secondary | ICD-10-CM | POA: Diagnosis not present

## 2014-09-07 DIAGNOSIS — R161 Splenomegaly, not elsewhere classified: Secondary | ICD-10-CM | POA: Diagnosis not present

## 2014-09-07 DIAGNOSIS — H0520 Unspecified exophthalmos: Secondary | ICD-10-CM | POA: Diagnosis present

## 2014-09-07 DIAGNOSIS — R05 Cough: Secondary | ICD-10-CM | POA: Diagnosis not present

## 2014-09-07 DIAGNOSIS — B9620 Unspecified Escherichia coli [E. coli] as the cause of diseases classified elsewhere: Secondary | ICD-10-CM | POA: Diagnosis not present

## 2014-09-07 DIAGNOSIS — I872 Venous insufficiency (chronic) (peripheral): Secondary | ICD-10-CM | POA: Diagnosis present

## 2014-09-07 DIAGNOSIS — N3001 Acute cystitis with hematuria: Secondary | ICD-10-CM | POA: Diagnosis not present

## 2014-09-07 DIAGNOSIS — K5900 Constipation, unspecified: Secondary | ICD-10-CM | POA: Diagnosis present

## 2014-09-07 DIAGNOSIS — N390 Urinary tract infection, site not specified: Secondary | ICD-10-CM | POA: Diagnosis not present

## 2014-09-07 DIAGNOSIS — Z79899 Other long term (current) drug therapy: Secondary | ICD-10-CM | POA: Diagnosis not present

## 2014-09-07 DIAGNOSIS — L97929 Non-pressure chronic ulcer of unspecified part of left lower leg with unspecified severity: Secondary | ICD-10-CM | POA: Diagnosis present

## 2014-09-07 DIAGNOSIS — D630 Anemia in neoplastic disease: Secondary | ICD-10-CM | POA: Diagnosis present

## 2014-09-07 DIAGNOSIS — L03116 Cellulitis of left lower limb: Secondary | ICD-10-CM | POA: Diagnosis not present

## 2014-09-07 DIAGNOSIS — Z882 Allergy status to sulfonamides status: Secondary | ICD-10-CM | POA: Diagnosis not present

## 2014-09-07 DIAGNOSIS — M1990 Unspecified osteoarthritis, unspecified site: Secondary | ICD-10-CM | POA: Diagnosis present

## 2014-09-07 DIAGNOSIS — I517 Cardiomegaly: Secondary | ICD-10-CM | POA: Diagnosis not present

## 2014-09-07 DIAGNOSIS — L97919 Non-pressure chronic ulcer of unspecified part of right lower leg with unspecified severity: Secondary | ICD-10-CM | POA: Diagnosis not present

## 2014-09-07 DIAGNOSIS — L89899 Pressure ulcer of other site, unspecified stage: Secondary | ICD-10-CM | POA: Diagnosis not present

## 2014-09-07 DIAGNOSIS — A419 Sepsis, unspecified organism: Secondary | ICD-10-CM | POA: Diagnosis not present

## 2014-09-07 DIAGNOSIS — J811 Chronic pulmonary edema: Secondary | ICD-10-CM | POA: Diagnosis not present

## 2014-09-07 DIAGNOSIS — C9110 Chronic lymphocytic leukemia of B-cell type not having achieved remission: Secondary | ICD-10-CM | POA: Diagnosis not present

## 2014-09-07 DIAGNOSIS — E876 Hypokalemia: Secondary | ICD-10-CM | POA: Diagnosis not present

## 2014-09-07 DIAGNOSIS — A400 Sepsis due to streptococcus, group A: Secondary | ICD-10-CM | POA: Diagnosis not present

## 2014-09-07 DIAGNOSIS — N179 Acute kidney failure, unspecified: Secondary | ICD-10-CM | POA: Diagnosis not present

## 2014-09-07 DIAGNOSIS — R509 Fever, unspecified: Secondary | ICD-10-CM | POA: Diagnosis not present

## 2014-09-07 DIAGNOSIS — M549 Dorsalgia, unspecified: Secondary | ICD-10-CM | POA: Diagnosis not present

## 2014-09-07 DIAGNOSIS — I83029 Varicose veins of left lower extremity with ulcer of unspecified site: Secondary | ICD-10-CM | POA: Diagnosis not present

## 2014-09-07 DIAGNOSIS — D696 Thrombocytopenia, unspecified: Secondary | ICD-10-CM | POA: Diagnosis not present

## 2014-09-07 DIAGNOSIS — D61818 Other pancytopenia: Secondary | ICD-10-CM | POA: Diagnosis not present

## 2014-09-07 DIAGNOSIS — E43 Unspecified severe protein-calorie malnutrition: Secondary | ICD-10-CM | POA: Diagnosis not present

## 2014-09-07 DIAGNOSIS — E871 Hypo-osmolality and hyponatremia: Secondary | ICD-10-CM | POA: Diagnosis not present

## 2014-09-07 DIAGNOSIS — R918 Other nonspecific abnormal finding of lung field: Secondary | ICD-10-CM | POA: Diagnosis not present

## 2014-09-07 DIAGNOSIS — K219 Gastro-esophageal reflux disease without esophagitis: Secondary | ICD-10-CM | POA: Diagnosis present

## 2014-09-07 DIAGNOSIS — Z888 Allergy status to other drugs, medicaments and biological substances status: Secondary | ICD-10-CM | POA: Diagnosis not present

## 2014-09-07 DIAGNOSIS — J189 Pneumonia, unspecified organism: Secondary | ICD-10-CM | POA: Diagnosis present

## 2014-09-25 DIAGNOSIS — L98419 Non-pressure chronic ulcer of buttock with unspecified severity: Secondary | ICD-10-CM | POA: Diagnosis present

## 2014-09-25 DIAGNOSIS — R5381 Other malaise: Secondary | ICD-10-CM | POA: Diagnosis not present

## 2014-09-25 DIAGNOSIS — Z882 Allergy status to sulfonamides status: Secondary | ICD-10-CM | POA: Diagnosis not present

## 2014-09-25 DIAGNOSIS — H0520 Unspecified exophthalmos: Secondary | ICD-10-CM | POA: Diagnosis not present

## 2014-09-25 DIAGNOSIS — N3001 Acute cystitis with hematuria: Secondary | ICD-10-CM | POA: Diagnosis not present

## 2014-09-25 DIAGNOSIS — R279 Unspecified lack of coordination: Secondary | ICD-10-CM | POA: Diagnosis not present

## 2014-09-25 DIAGNOSIS — M542 Cervicalgia: Secondary | ICD-10-CM | POA: Diagnosis not present

## 2014-09-25 DIAGNOSIS — N390 Urinary tract infection, site not specified: Secondary | ICD-10-CM | POA: Diagnosis not present

## 2014-09-25 DIAGNOSIS — L98499 Non-pressure chronic ulcer of skin of other sites with unspecified severity: Secondary | ICD-10-CM | POA: Diagnosis present

## 2014-09-25 DIAGNOSIS — K5900 Constipation, unspecified: Secondary | ICD-10-CM | POA: Diagnosis not present

## 2014-09-25 DIAGNOSIS — J189 Pneumonia, unspecified organism: Secondary | ICD-10-CM | POA: Diagnosis not present

## 2014-09-25 DIAGNOSIS — D61818 Other pancytopenia: Secondary | ICD-10-CM | POA: Diagnosis not present

## 2014-09-25 DIAGNOSIS — L03116 Cellulitis of left lower limb: Secondary | ICD-10-CM | POA: Diagnosis not present

## 2014-09-25 DIAGNOSIS — B9620 Unspecified Escherichia coli [E. coli] as the cause of diseases classified elsewhere: Secondary | ICD-10-CM | POA: Diagnosis not present

## 2014-09-25 DIAGNOSIS — C9110 Chronic lymphocytic leukemia of B-cell type not having achieved remission: Secondary | ICD-10-CM | POA: Diagnosis not present

## 2014-09-25 DIAGNOSIS — Z888 Allergy status to other drugs, medicaments and biological substances status: Secondary | ICD-10-CM | POA: Diagnosis not present

## 2014-09-25 DIAGNOSIS — E871 Hypo-osmolality and hyponatremia: Secondary | ICD-10-CM | POA: Diagnosis not present

## 2014-09-25 DIAGNOSIS — I872 Venous insufficiency (chronic) (peripheral): Secondary | ICD-10-CM | POA: Diagnosis not present

## 2014-09-25 DIAGNOSIS — D696 Thrombocytopenia, unspecified: Secondary | ICD-10-CM | POA: Diagnosis not present

## 2014-09-25 DIAGNOSIS — M6281 Muscle weakness (generalized): Secondary | ICD-10-CM | POA: Diagnosis not present

## 2014-09-25 DIAGNOSIS — R262 Difficulty in walking, not elsewhere classified: Secondary | ICD-10-CM | POA: Diagnosis not present

## 2014-09-25 DIAGNOSIS — N179 Acute kidney failure, unspecified: Secondary | ICD-10-CM | POA: Diagnosis not present

## 2014-09-25 DIAGNOSIS — R509 Fever, unspecified: Secondary | ICD-10-CM | POA: Diagnosis not present

## 2014-09-25 DIAGNOSIS — Z23 Encounter for immunization: Secondary | ICD-10-CM | POA: Diagnosis not present

## 2014-09-25 DIAGNOSIS — E43 Unspecified severe protein-calorie malnutrition: Secondary | ICD-10-CM | POA: Diagnosis present

## 2014-09-25 DIAGNOSIS — L03119 Cellulitis of unspecified part of limb: Secondary | ICD-10-CM | POA: Diagnosis not present

## 2014-09-25 DIAGNOSIS — Z87440 Personal history of urinary (tract) infections: Secondary | ICD-10-CM | POA: Diagnosis not present

## 2014-09-25 DIAGNOSIS — I878 Other specified disorders of veins: Secondary | ICD-10-CM | POA: Diagnosis present

## 2014-09-25 DIAGNOSIS — Z7401 Bed confinement status: Secondary | ICD-10-CM | POA: Diagnosis not present

## 2014-09-25 DIAGNOSIS — N183 Chronic kidney disease, stage 3 (moderate): Secondary | ICD-10-CM | POA: Diagnosis not present

## 2014-09-25 DIAGNOSIS — I69352 Hemiplegia and hemiparesis following cerebral infarction affecting left dominant side: Secondary | ICD-10-CM | POA: Diagnosis not present

## 2014-09-25 DIAGNOSIS — Z79899 Other long term (current) drug therapy: Secondary | ICD-10-CM | POA: Diagnosis not present

## 2014-09-25 DIAGNOSIS — E669 Obesity, unspecified: Secondary | ICD-10-CM | POA: Diagnosis not present

## 2014-09-25 DIAGNOSIS — I83009 Varicose veins of unspecified lower extremity with ulcer of unspecified site: Secondary | ICD-10-CM | POA: Diagnosis not present

## 2014-09-25 DIAGNOSIS — D630 Anemia in neoplastic disease: Secondary | ICD-10-CM | POA: Diagnosis present

## 2014-09-25 DIAGNOSIS — K219 Gastro-esophageal reflux disease without esophagitis: Secondary | ICD-10-CM | POA: Diagnosis not present

## 2014-09-25 DIAGNOSIS — D6959 Other secondary thrombocytopenia: Secondary | ICD-10-CM | POA: Diagnosis present

## 2014-09-25 DIAGNOSIS — L97929 Non-pressure chronic ulcer of unspecified part of left lower leg with unspecified severity: Secondary | ICD-10-CM | POA: Diagnosis present

## 2014-09-25 DIAGNOSIS — L97919 Non-pressure chronic ulcer of unspecified part of right lower leg with unspecified severity: Secondary | ICD-10-CM | POA: Diagnosis present

## 2014-09-25 DIAGNOSIS — M1990 Unspecified osteoarthritis, unspecified site: Secondary | ICD-10-CM | POA: Diagnosis not present

## 2014-09-25 DIAGNOSIS — I83029 Varicose veins of left lower extremity with ulcer of unspecified site: Secondary | ICD-10-CM | POA: Diagnosis not present

## 2014-10-11 DIAGNOSIS — I69352 Hemiplegia and hemiparesis following cerebral infarction affecting left dominant side: Secondary | ICD-10-CM | POA: Diagnosis not present

## 2014-10-11 DIAGNOSIS — M542 Cervicalgia: Secondary | ICD-10-CM | POA: Diagnosis not present

## 2014-10-17 DIAGNOSIS — Z79899 Other long term (current) drug therapy: Secondary | ICD-10-CM | POA: Diagnosis not present

## 2014-10-17 DIAGNOSIS — M1990 Unspecified osteoarthritis, unspecified site: Secondary | ICD-10-CM | POA: Diagnosis not present

## 2014-10-17 DIAGNOSIS — L97821 Non-pressure chronic ulcer of other part of left lower leg limited to breakdown of skin: Secondary | ICD-10-CM | POA: Diagnosis not present

## 2014-10-17 DIAGNOSIS — F17210 Nicotine dependence, cigarettes, uncomplicated: Secondary | ICD-10-CM | POA: Diagnosis not present

## 2014-10-17 DIAGNOSIS — S81802A Unspecified open wound, left lower leg, initial encounter: Secondary | ICD-10-CM | POA: Diagnosis not present

## 2014-10-17 DIAGNOSIS — I872 Venous insufficiency (chronic) (peripheral): Secondary | ICD-10-CM | POA: Diagnosis not present

## 2014-10-17 DIAGNOSIS — K529 Noninfective gastroenteritis and colitis, unspecified: Secondary | ICD-10-CM | POA: Diagnosis not present

## 2014-10-18 DIAGNOSIS — I83028 Varicose veins of left lower extremity with ulcer other part of lower leg: Secondary | ICD-10-CM | POA: Diagnosis not present

## 2014-10-18 DIAGNOSIS — A047 Enterocolitis due to Clostridium difficile: Secondary | ICD-10-CM | POA: Diagnosis not present

## 2014-10-18 DIAGNOSIS — L97821 Non-pressure chronic ulcer of other part of left lower leg limited to breakdown of skin: Secondary | ICD-10-CM | POA: Diagnosis not present

## 2014-10-18 DIAGNOSIS — C9110 Chronic lymphocytic leukemia of B-cell type not having achieved remission: Secondary | ICD-10-CM | POA: Diagnosis not present

## 2014-10-19 DIAGNOSIS — L97821 Non-pressure chronic ulcer of other part of left lower leg limited to breakdown of skin: Secondary | ICD-10-CM | POA: Diagnosis not present

## 2014-10-19 DIAGNOSIS — C9110 Chronic lymphocytic leukemia of B-cell type not having achieved remission: Secondary | ICD-10-CM | POA: Diagnosis not present

## 2014-10-19 DIAGNOSIS — I83028 Varicose veins of left lower extremity with ulcer other part of lower leg: Secondary | ICD-10-CM | POA: Diagnosis not present

## 2014-10-19 DIAGNOSIS — A047 Enterocolitis due to Clostridium difficile: Secondary | ICD-10-CM | POA: Diagnosis not present

## 2014-10-20 DIAGNOSIS — L03119 Cellulitis of unspecified part of limb: Secondary | ICD-10-CM | POA: Diagnosis not present

## 2014-10-20 DIAGNOSIS — I83028 Varicose veins of left lower extremity with ulcer other part of lower leg: Secondary | ICD-10-CM | POA: Diagnosis not present

## 2014-10-20 DIAGNOSIS — E876 Hypokalemia: Secondary | ICD-10-CM | POA: Diagnosis not present

## 2014-10-20 DIAGNOSIS — L97821 Non-pressure chronic ulcer of other part of left lower leg limited to breakdown of skin: Secondary | ICD-10-CM | POA: Diagnosis not present

## 2014-10-20 DIAGNOSIS — C9110 Chronic lymphocytic leukemia of B-cell type not having achieved remission: Secondary | ICD-10-CM | POA: Diagnosis not present

## 2014-10-20 DIAGNOSIS — A047 Enterocolitis due to Clostridium difficile: Secondary | ICD-10-CM | POA: Diagnosis not present

## 2014-10-21 DIAGNOSIS — L03119 Cellulitis of unspecified part of limb: Secondary | ICD-10-CM | POA: Diagnosis not present

## 2014-10-21 DIAGNOSIS — E876 Hypokalemia: Secondary | ICD-10-CM | POA: Diagnosis not present

## 2014-10-21 DIAGNOSIS — C9110 Chronic lymphocytic leukemia of B-cell type not having achieved remission: Secondary | ICD-10-CM | POA: Diagnosis not present

## 2014-10-22 DIAGNOSIS — C9110 Chronic lymphocytic leukemia of B-cell type not having achieved remission: Secondary | ICD-10-CM | POA: Diagnosis not present

## 2014-10-22 DIAGNOSIS — I83028 Varicose veins of left lower extremity with ulcer other part of lower leg: Secondary | ICD-10-CM | POA: Diagnosis not present

## 2014-10-22 DIAGNOSIS — L97821 Non-pressure chronic ulcer of other part of left lower leg limited to breakdown of skin: Secondary | ICD-10-CM | POA: Diagnosis not present

## 2014-10-22 DIAGNOSIS — A047 Enterocolitis due to Clostridium difficile: Secondary | ICD-10-CM | POA: Diagnosis not present

## 2014-10-23 DIAGNOSIS — I872 Venous insufficiency (chronic) (peripheral): Secondary | ICD-10-CM | POA: Diagnosis not present

## 2014-10-23 DIAGNOSIS — C9110 Chronic lymphocytic leukemia of B-cell type not having achieved remission: Secondary | ICD-10-CM | POA: Diagnosis not present

## 2014-10-23 DIAGNOSIS — L97222 Non-pressure chronic ulcer of left calf with fat layer exposed: Secondary | ICD-10-CM | POA: Diagnosis not present

## 2014-10-23 DIAGNOSIS — Z87440 Personal history of urinary (tract) infections: Secondary | ICD-10-CM | POA: Diagnosis not present

## 2014-10-24 DIAGNOSIS — L97222 Non-pressure chronic ulcer of left calf with fat layer exposed: Secondary | ICD-10-CM | POA: Diagnosis not present

## 2014-10-24 DIAGNOSIS — I872 Venous insufficiency (chronic) (peripheral): Secondary | ICD-10-CM | POA: Diagnosis not present

## 2014-10-24 DIAGNOSIS — L97821 Non-pressure chronic ulcer of other part of left lower leg limited to breakdown of skin: Secondary | ICD-10-CM | POA: Diagnosis not present

## 2014-10-25 DIAGNOSIS — M159 Polyosteoarthritis, unspecified: Secondary | ICD-10-CM | POA: Diagnosis not present

## 2014-10-25 DIAGNOSIS — M810 Age-related osteoporosis without current pathological fracture: Secondary | ICD-10-CM | POA: Diagnosis not present

## 2014-10-25 DIAGNOSIS — Z720 Tobacco use: Secondary | ICD-10-CM | POA: Diagnosis not present

## 2014-10-25 DIAGNOSIS — I10 Essential (primary) hypertension: Secondary | ICD-10-CM | POA: Diagnosis not present

## 2014-10-25 DIAGNOSIS — L98499 Non-pressure chronic ulcer of skin of other sites with unspecified severity: Secondary | ICD-10-CM | POA: Diagnosis not present

## 2014-10-25 DIAGNOSIS — R600 Localized edema: Secondary | ICD-10-CM | POA: Diagnosis not present

## 2014-10-25 DIAGNOSIS — K219 Gastro-esophageal reflux disease without esophagitis: Secondary | ICD-10-CM | POA: Diagnosis not present

## 2014-10-25 DIAGNOSIS — Z6837 Body mass index (BMI) 37.0-37.9, adult: Secondary | ICD-10-CM | POA: Diagnosis not present

## 2014-10-25 DIAGNOSIS — E8881 Metabolic syndrome: Secondary | ICD-10-CM | POA: Diagnosis not present

## 2014-10-25 DIAGNOSIS — C91Z2 Other lymphoid leukemia, in relapse: Secondary | ICD-10-CM | POA: Diagnosis not present

## 2014-10-25 DIAGNOSIS — E781 Pure hyperglyceridemia: Secondary | ICD-10-CM | POA: Diagnosis not present

## 2014-10-26 DIAGNOSIS — Z87440 Personal history of urinary (tract) infections: Secondary | ICD-10-CM | POA: Diagnosis not present

## 2014-10-26 DIAGNOSIS — C9110 Chronic lymphocytic leukemia of B-cell type not having achieved remission: Secondary | ICD-10-CM | POA: Diagnosis not present

## 2014-10-26 DIAGNOSIS — L97222 Non-pressure chronic ulcer of left calf with fat layer exposed: Secondary | ICD-10-CM | POA: Diagnosis not present

## 2014-10-26 DIAGNOSIS — I872 Venous insufficiency (chronic) (peripheral): Secondary | ICD-10-CM | POA: Diagnosis not present

## 2014-10-27 DIAGNOSIS — Z5111 Encounter for antineoplastic chemotherapy: Secondary | ICD-10-CM | POA: Diagnosis not present

## 2014-10-27 DIAGNOSIS — C9110 Chronic lymphocytic leukemia of B-cell type not having achieved remission: Secondary | ICD-10-CM | POA: Diagnosis not present

## 2014-10-28 DIAGNOSIS — C9110 Chronic lymphocytic leukemia of B-cell type not having achieved remission: Secondary | ICD-10-CM | POA: Diagnosis not present

## 2014-10-28 DIAGNOSIS — L97222 Non-pressure chronic ulcer of left calf with fat layer exposed: Secondary | ICD-10-CM | POA: Diagnosis not present

## 2014-10-28 DIAGNOSIS — I872 Venous insufficiency (chronic) (peripheral): Secondary | ICD-10-CM | POA: Diagnosis not present

## 2014-10-28 DIAGNOSIS — Z87440 Personal history of urinary (tract) infections: Secondary | ICD-10-CM | POA: Diagnosis not present

## 2014-10-30 DIAGNOSIS — L97222 Non-pressure chronic ulcer of left calf with fat layer exposed: Secondary | ICD-10-CM | POA: Diagnosis not present

## 2014-10-30 DIAGNOSIS — I872 Venous insufficiency (chronic) (peripheral): Secondary | ICD-10-CM | POA: Diagnosis not present

## 2014-10-30 DIAGNOSIS — C9110 Chronic lymphocytic leukemia of B-cell type not having achieved remission: Secondary | ICD-10-CM | POA: Diagnosis not present

## 2014-10-30 DIAGNOSIS — Z87440 Personal history of urinary (tract) infections: Secondary | ICD-10-CM | POA: Diagnosis not present

## 2014-10-31 DIAGNOSIS — L97821 Non-pressure chronic ulcer of other part of left lower leg limited to breakdown of skin: Secondary | ICD-10-CM | POA: Diagnosis not present

## 2014-10-31 DIAGNOSIS — I872 Venous insufficiency (chronic) (peripheral): Secondary | ICD-10-CM | POA: Diagnosis not present

## 2014-10-31 DIAGNOSIS — L97222 Non-pressure chronic ulcer of left calf with fat layer exposed: Secondary | ICD-10-CM | POA: Diagnosis not present

## 2014-11-01 DIAGNOSIS — I872 Venous insufficiency (chronic) (peripheral): Secondary | ICD-10-CM | POA: Diagnosis not present

## 2014-11-01 DIAGNOSIS — L97222 Non-pressure chronic ulcer of left calf with fat layer exposed: Secondary | ICD-10-CM | POA: Diagnosis not present

## 2014-11-01 DIAGNOSIS — Z87440 Personal history of urinary (tract) infections: Secondary | ICD-10-CM | POA: Diagnosis not present

## 2014-11-01 DIAGNOSIS — C9110 Chronic lymphocytic leukemia of B-cell type not having achieved remission: Secondary | ICD-10-CM | POA: Diagnosis not present

## 2014-11-02 DIAGNOSIS — K219 Gastro-esophageal reflux disease without esophagitis: Secondary | ICD-10-CM | POA: Diagnosis not present

## 2014-11-02 DIAGNOSIS — M159 Polyosteoarthritis, unspecified: Secondary | ICD-10-CM | POA: Diagnosis not present

## 2014-11-02 DIAGNOSIS — E781 Pure hyperglyceridemia: Secondary | ICD-10-CM | POA: Diagnosis not present

## 2014-11-02 DIAGNOSIS — M810 Age-related osteoporosis without current pathological fracture: Secondary | ICD-10-CM | POA: Diagnosis not present

## 2014-11-02 DIAGNOSIS — L98499 Non-pressure chronic ulcer of skin of other sites with unspecified severity: Secondary | ICD-10-CM | POA: Diagnosis not present

## 2014-11-02 DIAGNOSIS — I10 Essential (primary) hypertension: Secondary | ICD-10-CM | POA: Diagnosis not present

## 2014-11-02 DIAGNOSIS — R600 Localized edema: Secondary | ICD-10-CM | POA: Diagnosis not present

## 2014-11-02 DIAGNOSIS — Z9181 History of falling: Secondary | ICD-10-CM | POA: Diagnosis not present

## 2014-11-02 DIAGNOSIS — Z720 Tobacco use: Secondary | ICD-10-CM | POA: Diagnosis not present

## 2014-11-02 DIAGNOSIS — E8881 Metabolic syndrome: Secondary | ICD-10-CM | POA: Diagnosis not present

## 2014-11-02 DIAGNOSIS — Z6836 Body mass index (BMI) 36.0-36.9, adult: Secondary | ICD-10-CM | POA: Diagnosis not present

## 2014-11-02 DIAGNOSIS — C91Z2 Other lymphoid leukemia, in relapse: Secondary | ICD-10-CM | POA: Diagnosis not present

## 2014-11-03 DIAGNOSIS — L97222 Non-pressure chronic ulcer of left calf with fat layer exposed: Secondary | ICD-10-CM | POA: Diagnosis not present

## 2014-11-03 DIAGNOSIS — C9110 Chronic lymphocytic leukemia of B-cell type not having achieved remission: Secondary | ICD-10-CM | POA: Diagnosis not present

## 2014-11-03 DIAGNOSIS — Z87440 Personal history of urinary (tract) infections: Secondary | ICD-10-CM | POA: Diagnosis not present

## 2014-11-03 DIAGNOSIS — I872 Venous insufficiency (chronic) (peripheral): Secondary | ICD-10-CM | POA: Diagnosis not present

## 2014-11-04 DIAGNOSIS — C9510 Chronic leukemia of unspecified cell type not having achieved remission: Secondary | ICD-10-CM | POA: Diagnosis not present

## 2014-11-04 DIAGNOSIS — C9110 Chronic lymphocytic leukemia of B-cell type not having achieved remission: Secondary | ICD-10-CM | POA: Diagnosis not present

## 2014-11-04 DIAGNOSIS — S81802A Unspecified open wound, left lower leg, initial encounter: Secondary | ICD-10-CM | POA: Diagnosis not present

## 2014-11-04 DIAGNOSIS — Z87828 Personal history of other (healed) physical injury and trauma: Secondary | ICD-10-CM | POA: Diagnosis not present

## 2014-11-07 DIAGNOSIS — L97222 Non-pressure chronic ulcer of left calf with fat layer exposed: Secondary | ICD-10-CM | POA: Diagnosis not present

## 2014-11-07 DIAGNOSIS — L97821 Non-pressure chronic ulcer of other part of left lower leg limited to breakdown of skin: Secondary | ICD-10-CM | POA: Diagnosis not present

## 2014-11-07 DIAGNOSIS — I872 Venous insufficiency (chronic) (peripheral): Secondary | ICD-10-CM | POA: Diagnosis not present

## 2014-11-11 DIAGNOSIS — L97821 Non-pressure chronic ulcer of other part of left lower leg limited to breakdown of skin: Secondary | ICD-10-CM | POA: Diagnosis not present

## 2014-11-11 DIAGNOSIS — I872 Venous insufficiency (chronic) (peripheral): Secondary | ICD-10-CM | POA: Diagnosis not present

## 2014-11-14 DIAGNOSIS — I83028 Varicose veins of left lower extremity with ulcer other part of lower leg: Secondary | ICD-10-CM | POA: Diagnosis not present

## 2014-11-14 DIAGNOSIS — I872 Venous insufficiency (chronic) (peripheral): Secondary | ICD-10-CM | POA: Diagnosis not present

## 2014-11-14 DIAGNOSIS — L97821 Non-pressure chronic ulcer of other part of left lower leg limited to breakdown of skin: Secondary | ICD-10-CM | POA: Diagnosis not present

## 2014-11-14 DIAGNOSIS — L97222 Non-pressure chronic ulcer of left calf with fat layer exposed: Secondary | ICD-10-CM | POA: Diagnosis not present

## 2014-11-14 DIAGNOSIS — C9110 Chronic lymphocytic leukemia of B-cell type not having achieved remission: Secondary | ICD-10-CM | POA: Diagnosis not present

## 2014-11-16 DIAGNOSIS — I872 Venous insufficiency (chronic) (peripheral): Secondary | ICD-10-CM | POA: Diagnosis not present

## 2014-11-16 DIAGNOSIS — Z87440 Personal history of urinary (tract) infections: Secondary | ICD-10-CM | POA: Diagnosis not present

## 2014-11-16 DIAGNOSIS — C9110 Chronic lymphocytic leukemia of B-cell type not having achieved remission: Secondary | ICD-10-CM | POA: Diagnosis not present

## 2014-11-16 DIAGNOSIS — L97222 Non-pressure chronic ulcer of left calf with fat layer exposed: Secondary | ICD-10-CM | POA: Diagnosis not present

## 2014-11-21 DIAGNOSIS — L97821 Non-pressure chronic ulcer of other part of left lower leg limited to breakdown of skin: Secondary | ICD-10-CM | POA: Diagnosis not present

## 2014-11-21 DIAGNOSIS — L97222 Non-pressure chronic ulcer of left calf with fat layer exposed: Secondary | ICD-10-CM | POA: Diagnosis not present

## 2014-11-21 DIAGNOSIS — I872 Venous insufficiency (chronic) (peripheral): Secondary | ICD-10-CM | POA: Diagnosis not present

## 2014-11-23 DIAGNOSIS — I872 Venous insufficiency (chronic) (peripheral): Secondary | ICD-10-CM | POA: Diagnosis not present

## 2014-11-23 DIAGNOSIS — C9110 Chronic lymphocytic leukemia of B-cell type not having achieved remission: Secondary | ICD-10-CM | POA: Diagnosis not present

## 2014-11-23 DIAGNOSIS — Z87440 Personal history of urinary (tract) infections: Secondary | ICD-10-CM | POA: Diagnosis not present

## 2014-11-23 DIAGNOSIS — L97222 Non-pressure chronic ulcer of left calf with fat layer exposed: Secondary | ICD-10-CM | POA: Diagnosis not present

## 2014-11-24 DIAGNOSIS — C9110 Chronic lymphocytic leukemia of B-cell type not having achieved remission: Secondary | ICD-10-CM | POA: Diagnosis not present

## 2014-11-24 DIAGNOSIS — D61818 Other pancytopenia: Secondary | ICD-10-CM | POA: Diagnosis not present

## 2014-11-24 DIAGNOSIS — D819 Combined immunodeficiency, unspecified: Secondary | ICD-10-CM | POA: Diagnosis not present

## 2014-11-24 DIAGNOSIS — Z0001 Encounter for general adult medical examination with abnormal findings: Secondary | ICD-10-CM | POA: Diagnosis not present

## 2014-11-24 DIAGNOSIS — D649 Anemia, unspecified: Secondary | ICD-10-CM | POA: Diagnosis not present

## 2014-11-28 DIAGNOSIS — L97222 Non-pressure chronic ulcer of left calf with fat layer exposed: Secondary | ICD-10-CM | POA: Diagnosis not present

## 2014-11-28 DIAGNOSIS — L97821 Non-pressure chronic ulcer of other part of left lower leg limited to breakdown of skin: Secondary | ICD-10-CM | POA: Diagnosis not present

## 2014-11-28 DIAGNOSIS — I872 Venous insufficiency (chronic) (peripheral): Secondary | ICD-10-CM | POA: Diagnosis not present

## 2014-11-30 DIAGNOSIS — Z87440 Personal history of urinary (tract) infections: Secondary | ICD-10-CM | POA: Diagnosis not present

## 2014-11-30 DIAGNOSIS — L97222 Non-pressure chronic ulcer of left calf with fat layer exposed: Secondary | ICD-10-CM | POA: Diagnosis not present

## 2014-11-30 DIAGNOSIS — I872 Venous insufficiency (chronic) (peripheral): Secondary | ICD-10-CM | POA: Diagnosis not present

## 2014-11-30 DIAGNOSIS — C9110 Chronic lymphocytic leukemia of B-cell type not having achieved remission: Secondary | ICD-10-CM | POA: Diagnosis not present

## 2014-12-06 DIAGNOSIS — I872 Venous insufficiency (chronic) (peripheral): Secondary | ICD-10-CM | POA: Diagnosis not present

## 2014-12-06 DIAGNOSIS — L97821 Non-pressure chronic ulcer of other part of left lower leg limited to breakdown of skin: Secondary | ICD-10-CM | POA: Diagnosis not present

## 2014-12-06 DIAGNOSIS — L97222 Non-pressure chronic ulcer of left calf with fat layer exposed: Secondary | ICD-10-CM | POA: Diagnosis not present

## 2014-12-07 DIAGNOSIS — R600 Localized edema: Secondary | ICD-10-CM | POA: Diagnosis not present

## 2014-12-07 DIAGNOSIS — E8881 Metabolic syndrome: Secondary | ICD-10-CM | POA: Diagnosis not present

## 2014-12-07 DIAGNOSIS — L98499 Non-pressure chronic ulcer of skin of other sites with unspecified severity: Secondary | ICD-10-CM | POA: Diagnosis not present

## 2014-12-07 DIAGNOSIS — I10 Essential (primary) hypertension: Secondary | ICD-10-CM | POA: Diagnosis not present

## 2014-12-07 DIAGNOSIS — Z720 Tobacco use: Secondary | ICD-10-CM | POA: Diagnosis not present

## 2014-12-07 DIAGNOSIS — E781 Pure hyperglyceridemia: Secondary | ICD-10-CM | POA: Diagnosis not present

## 2014-12-07 DIAGNOSIS — M810 Age-related osteoporosis without current pathological fracture: Secondary | ICD-10-CM | POA: Diagnosis not present

## 2014-12-07 DIAGNOSIS — Z6833 Body mass index (BMI) 33.0-33.9, adult: Secondary | ICD-10-CM | POA: Diagnosis not present

## 2014-12-07 DIAGNOSIS — M159 Polyosteoarthritis, unspecified: Secondary | ICD-10-CM | POA: Diagnosis not present

## 2014-12-07 DIAGNOSIS — K219 Gastro-esophageal reflux disease without esophagitis: Secondary | ICD-10-CM | POA: Diagnosis not present

## 2014-12-07 DIAGNOSIS — C91Z2 Other lymphoid leukemia, in relapse: Secondary | ICD-10-CM | POA: Diagnosis not present

## 2014-12-08 DIAGNOSIS — R161 Splenomegaly, not elsewhere classified: Secondary | ICD-10-CM | POA: Diagnosis not present

## 2014-12-08 DIAGNOSIS — D61818 Other pancytopenia: Secondary | ICD-10-CM | POA: Diagnosis not present

## 2014-12-08 DIAGNOSIS — C9110 Chronic lymphocytic leukemia of B-cell type not having achieved remission: Secondary | ICD-10-CM | POA: Diagnosis not present

## 2014-12-09 DIAGNOSIS — C9110 Chronic lymphocytic leukemia of B-cell type not having achieved remission: Secondary | ICD-10-CM | POA: Diagnosis not present

## 2014-12-09 DIAGNOSIS — I872 Venous insufficiency (chronic) (peripheral): Secondary | ICD-10-CM | POA: Diagnosis not present

## 2014-12-09 DIAGNOSIS — Z87440 Personal history of urinary (tract) infections: Secondary | ICD-10-CM | POA: Diagnosis not present

## 2014-12-09 DIAGNOSIS — L97222 Non-pressure chronic ulcer of left calf with fat layer exposed: Secondary | ICD-10-CM | POA: Diagnosis not present

## 2014-12-12 DIAGNOSIS — L97222 Non-pressure chronic ulcer of left calf with fat layer exposed: Secondary | ICD-10-CM | POA: Diagnosis not present

## 2014-12-12 DIAGNOSIS — L97821 Non-pressure chronic ulcer of other part of left lower leg limited to breakdown of skin: Secondary | ICD-10-CM | POA: Diagnosis not present

## 2014-12-12 DIAGNOSIS — I872 Venous insufficiency (chronic) (peripheral): Secondary | ICD-10-CM | POA: Diagnosis not present

## 2014-12-14 DIAGNOSIS — I872 Venous insufficiency (chronic) (peripheral): Secondary | ICD-10-CM | POA: Diagnosis not present

## 2014-12-14 DIAGNOSIS — C9110 Chronic lymphocytic leukemia of B-cell type not having achieved remission: Secondary | ICD-10-CM | POA: Diagnosis not present

## 2014-12-14 DIAGNOSIS — Z87440 Personal history of urinary (tract) infections: Secondary | ICD-10-CM | POA: Diagnosis not present

## 2014-12-14 DIAGNOSIS — L97222 Non-pressure chronic ulcer of left calf with fat layer exposed: Secondary | ICD-10-CM | POA: Diagnosis not present

## 2014-12-19 DIAGNOSIS — I872 Venous insufficiency (chronic) (peripheral): Secondary | ICD-10-CM | POA: Diagnosis not present

## 2014-12-19 DIAGNOSIS — L97821 Non-pressure chronic ulcer of other part of left lower leg limited to breakdown of skin: Secondary | ICD-10-CM | POA: Diagnosis not present

## 2014-12-19 DIAGNOSIS — L97222 Non-pressure chronic ulcer of left calf with fat layer exposed: Secondary | ICD-10-CM | POA: Diagnosis not present

## 2014-12-21 DIAGNOSIS — I872 Venous insufficiency (chronic) (peripheral): Secondary | ICD-10-CM | POA: Diagnosis not present

## 2014-12-21 DIAGNOSIS — L97222 Non-pressure chronic ulcer of left calf with fat layer exposed: Secondary | ICD-10-CM | POA: Diagnosis not present

## 2014-12-21 DIAGNOSIS — Z87440 Personal history of urinary (tract) infections: Secondary | ICD-10-CM | POA: Diagnosis not present

## 2014-12-21 DIAGNOSIS — C9110 Chronic lymphocytic leukemia of B-cell type not having achieved remission: Secondary | ICD-10-CM | POA: Diagnosis not present

## 2014-12-22 DIAGNOSIS — C9110 Chronic lymphocytic leukemia of B-cell type not having achieved remission: Secondary | ICD-10-CM | POA: Diagnosis not present

## 2014-12-22 DIAGNOSIS — L97222 Non-pressure chronic ulcer of left calf with fat layer exposed: Secondary | ICD-10-CM | POA: Diagnosis not present

## 2014-12-22 DIAGNOSIS — E876 Hypokalemia: Secondary | ICD-10-CM | POA: Diagnosis not present

## 2014-12-22 DIAGNOSIS — I872 Venous insufficiency (chronic) (peripheral): Secondary | ICD-10-CM | POA: Diagnosis not present

## 2014-12-22 DIAGNOSIS — D819 Combined immunodeficiency, unspecified: Secondary | ICD-10-CM | POA: Diagnosis not present

## 2014-12-26 DIAGNOSIS — I872 Venous insufficiency (chronic) (peripheral): Secondary | ICD-10-CM | POA: Diagnosis not present

## 2014-12-26 DIAGNOSIS — L97821 Non-pressure chronic ulcer of other part of left lower leg limited to breakdown of skin: Secondary | ICD-10-CM | POA: Diagnosis not present

## 2014-12-26 DIAGNOSIS — L97222 Non-pressure chronic ulcer of left calf with fat layer exposed: Secondary | ICD-10-CM | POA: Diagnosis not present

## 2014-12-29 DIAGNOSIS — I872 Venous insufficiency (chronic) (peripheral): Secondary | ICD-10-CM | POA: Diagnosis not present

## 2014-12-29 DIAGNOSIS — L97821 Non-pressure chronic ulcer of other part of left lower leg limited to breakdown of skin: Secondary | ICD-10-CM | POA: Diagnosis not present

## 2015-01-02 DIAGNOSIS — L97222 Non-pressure chronic ulcer of left calf with fat layer exposed: Secondary | ICD-10-CM | POA: Diagnosis not present

## 2015-01-02 DIAGNOSIS — L97821 Non-pressure chronic ulcer of other part of left lower leg limited to breakdown of skin: Secondary | ICD-10-CM | POA: Diagnosis not present

## 2015-01-02 DIAGNOSIS — I872 Venous insufficiency (chronic) (peripheral): Secondary | ICD-10-CM | POA: Diagnosis not present

## 2015-01-04 DIAGNOSIS — M810 Age-related osteoporosis without current pathological fracture: Secondary | ICD-10-CM | POA: Diagnosis not present

## 2015-01-04 DIAGNOSIS — E781 Pure hyperglyceridemia: Secondary | ICD-10-CM | POA: Diagnosis not present

## 2015-01-04 DIAGNOSIS — E8881 Metabolic syndrome: Secondary | ICD-10-CM | POA: Diagnosis not present

## 2015-01-04 DIAGNOSIS — Z720 Tobacco use: Secondary | ICD-10-CM | POA: Diagnosis not present

## 2015-01-04 DIAGNOSIS — I10 Essential (primary) hypertension: Secondary | ICD-10-CM | POA: Diagnosis not present

## 2015-01-04 DIAGNOSIS — R600 Localized edema: Secondary | ICD-10-CM | POA: Diagnosis not present

## 2015-01-04 DIAGNOSIS — L98499 Non-pressure chronic ulcer of skin of other sites with unspecified severity: Secondary | ICD-10-CM | POA: Diagnosis not present

## 2015-01-04 DIAGNOSIS — C91Z2 Other lymphoid leukemia, in relapse: Secondary | ICD-10-CM | POA: Diagnosis not present

## 2015-01-04 DIAGNOSIS — Z6833 Body mass index (BMI) 33.0-33.9, adult: Secondary | ICD-10-CM | POA: Diagnosis not present

## 2015-01-04 DIAGNOSIS — K219 Gastro-esophageal reflux disease without esophagitis: Secondary | ICD-10-CM | POA: Diagnosis not present

## 2015-01-04 DIAGNOSIS — J208 Acute bronchitis due to other specified organisms: Secondary | ICD-10-CM | POA: Diagnosis not present

## 2015-01-04 DIAGNOSIS — M159 Polyosteoarthritis, unspecified: Secondary | ICD-10-CM | POA: Diagnosis not present

## 2015-01-05 DIAGNOSIS — L97821 Non-pressure chronic ulcer of other part of left lower leg limited to breakdown of skin: Secondary | ICD-10-CM | POA: Diagnosis not present

## 2015-01-05 DIAGNOSIS — I872 Venous insufficiency (chronic) (peripheral): Secondary | ICD-10-CM | POA: Diagnosis not present

## 2015-01-06 DIAGNOSIS — I83028 Varicose veins of left lower extremity with ulcer other part of lower leg: Secondary | ICD-10-CM | POA: Diagnosis not present

## 2015-01-06 DIAGNOSIS — L97821 Non-pressure chronic ulcer of other part of left lower leg limited to breakdown of skin: Secondary | ICD-10-CM | POA: Diagnosis not present

## 2015-01-09 DIAGNOSIS — L97222 Non-pressure chronic ulcer of left calf with fat layer exposed: Secondary | ICD-10-CM | POA: Diagnosis not present

## 2015-01-09 DIAGNOSIS — I872 Venous insufficiency (chronic) (peripheral): Secondary | ICD-10-CM | POA: Diagnosis not present

## 2015-01-09 DIAGNOSIS — L97821 Non-pressure chronic ulcer of other part of left lower leg limited to breakdown of skin: Secondary | ICD-10-CM | POA: Diagnosis not present

## 2015-01-12 DIAGNOSIS — L97821 Non-pressure chronic ulcer of other part of left lower leg limited to breakdown of skin: Secondary | ICD-10-CM | POA: Diagnosis not present

## 2015-01-12 DIAGNOSIS — I872 Venous insufficiency (chronic) (peripheral): Secondary | ICD-10-CM | POA: Diagnosis not present

## 2015-01-16 DIAGNOSIS — L97222 Non-pressure chronic ulcer of left calf with fat layer exposed: Secondary | ICD-10-CM | POA: Diagnosis not present

## 2015-01-16 DIAGNOSIS — I872 Venous insufficiency (chronic) (peripheral): Secondary | ICD-10-CM | POA: Diagnosis not present

## 2015-01-16 DIAGNOSIS — Z872 Personal history of diseases of the skin and subcutaneous tissue: Secondary | ICD-10-CM | POA: Diagnosis not present

## 2015-01-20 DIAGNOSIS — Z23 Encounter for immunization: Secondary | ICD-10-CM | POA: Diagnosis not present

## 2015-01-20 DIAGNOSIS — D819 Combined immunodeficiency, unspecified: Secondary | ICD-10-CM | POA: Diagnosis not present

## 2015-01-20 DIAGNOSIS — C9210 Chronic myeloid leukemia, BCR/ABL-positive, not having achieved remission: Secondary | ICD-10-CM | POA: Diagnosis not present

## 2015-01-31 DIAGNOSIS — L97222 Non-pressure chronic ulcer of left calf with fat layer exposed: Secondary | ICD-10-CM | POA: Diagnosis not present

## 2015-01-31 DIAGNOSIS — I872 Venous insufficiency (chronic) (peripheral): Secondary | ICD-10-CM | POA: Diagnosis not present

## 2015-01-31 DIAGNOSIS — L97821 Non-pressure chronic ulcer of other part of left lower leg limited to breakdown of skin: Secondary | ICD-10-CM | POA: Diagnosis not present

## 2015-02-06 DIAGNOSIS — L97222 Non-pressure chronic ulcer of left calf with fat layer exposed: Secondary | ICD-10-CM | POA: Diagnosis not present

## 2015-02-06 DIAGNOSIS — L97821 Non-pressure chronic ulcer of other part of left lower leg limited to breakdown of skin: Secondary | ICD-10-CM | POA: Diagnosis not present

## 2015-02-06 DIAGNOSIS — I872 Venous insufficiency (chronic) (peripheral): Secondary | ICD-10-CM | POA: Diagnosis not present

## 2015-02-09 DIAGNOSIS — L98499 Non-pressure chronic ulcer of skin of other sites with unspecified severity: Secondary | ICD-10-CM | POA: Diagnosis not present

## 2015-02-09 DIAGNOSIS — I10 Essential (primary) hypertension: Secondary | ICD-10-CM | POA: Diagnosis not present

## 2015-02-09 DIAGNOSIS — Z1389 Encounter for screening for other disorder: Secondary | ICD-10-CM | POA: Diagnosis not present

## 2015-02-09 DIAGNOSIS — R600 Localized edema: Secondary | ICD-10-CM | POA: Diagnosis not present

## 2015-02-09 DIAGNOSIS — E8881 Metabolic syndrome: Secondary | ICD-10-CM | POA: Diagnosis not present

## 2015-02-09 DIAGNOSIS — M810 Age-related osteoporosis without current pathological fracture: Secondary | ICD-10-CM | POA: Diagnosis not present

## 2015-02-09 DIAGNOSIS — M159 Polyosteoarthritis, unspecified: Secondary | ICD-10-CM | POA: Diagnosis not present

## 2015-02-09 DIAGNOSIS — C91Z2 Other lymphoid leukemia, in relapse: Secondary | ICD-10-CM | POA: Diagnosis not present

## 2015-02-09 DIAGNOSIS — E781 Pure hyperglyceridemia: Secondary | ICD-10-CM | POA: Diagnosis not present

## 2015-02-09 DIAGNOSIS — K219 Gastro-esophageal reflux disease without esophagitis: Secondary | ICD-10-CM | POA: Diagnosis not present

## 2015-02-09 DIAGNOSIS — Z720 Tobacco use: Secondary | ICD-10-CM | POA: Diagnosis not present

## 2015-02-13 DIAGNOSIS — I872 Venous insufficiency (chronic) (peripheral): Secondary | ICD-10-CM | POA: Diagnosis not present

## 2015-02-13 DIAGNOSIS — L97821 Non-pressure chronic ulcer of other part of left lower leg limited to breakdown of skin: Secondary | ICD-10-CM | POA: Diagnosis not present

## 2015-02-13 DIAGNOSIS — L97221 Non-pressure chronic ulcer of left calf limited to breakdown of skin: Secondary | ICD-10-CM | POA: Diagnosis not present

## 2015-02-17 DIAGNOSIS — D801 Nonfamilial hypogammaglobulinemia: Secondary | ICD-10-CM | POA: Diagnosis not present

## 2015-02-17 DIAGNOSIS — C9110 Chronic lymphocytic leukemia of B-cell type not having achieved remission: Secondary | ICD-10-CM | POA: Diagnosis not present

## 2015-02-17 DIAGNOSIS — E876 Hypokalemia: Secondary | ICD-10-CM | POA: Diagnosis not present

## 2015-02-20 DIAGNOSIS — L97821 Non-pressure chronic ulcer of other part of left lower leg limited to breakdown of skin: Secondary | ICD-10-CM | POA: Diagnosis not present

## 2015-02-20 DIAGNOSIS — L97222 Non-pressure chronic ulcer of left calf with fat layer exposed: Secondary | ICD-10-CM | POA: Diagnosis not present

## 2015-02-20 DIAGNOSIS — I872 Venous insufficiency (chronic) (peripheral): Secondary | ICD-10-CM | POA: Diagnosis not present

## 2015-02-27 DIAGNOSIS — L97222 Non-pressure chronic ulcer of left calf with fat layer exposed: Secondary | ICD-10-CM | POA: Diagnosis not present

## 2015-02-27 DIAGNOSIS — L97821 Non-pressure chronic ulcer of other part of left lower leg limited to breakdown of skin: Secondary | ICD-10-CM | POA: Diagnosis not present

## 2015-02-27 DIAGNOSIS — I872 Venous insufficiency (chronic) (peripheral): Secondary | ICD-10-CM | POA: Diagnosis not present

## 2015-03-06 DIAGNOSIS — L97821 Non-pressure chronic ulcer of other part of left lower leg limited to breakdown of skin: Secondary | ICD-10-CM | POA: Diagnosis not present

## 2015-03-06 DIAGNOSIS — L97221 Non-pressure chronic ulcer of left calf limited to breakdown of skin: Secondary | ICD-10-CM | POA: Diagnosis not present

## 2015-03-06 DIAGNOSIS — I872 Venous insufficiency (chronic) (peripheral): Secondary | ICD-10-CM | POA: Diagnosis not present

## 2015-03-13 DIAGNOSIS — Z872 Personal history of diseases of the skin and subcutaneous tissue: Secondary | ICD-10-CM | POA: Diagnosis not present

## 2015-03-13 DIAGNOSIS — I872 Venous insufficiency (chronic) (peripheral): Secondary | ICD-10-CM | POA: Diagnosis not present

## 2015-03-13 DIAGNOSIS — Z09 Encounter for follow-up examination after completed treatment for conditions other than malignant neoplasm: Secondary | ICD-10-CM | POA: Diagnosis not present

## 2015-03-13 DIAGNOSIS — L97221 Non-pressure chronic ulcer of left calf limited to breakdown of skin: Secondary | ICD-10-CM | POA: Diagnosis not present

## 2015-03-17 DIAGNOSIS — D8189 Other combined immunodeficiencies: Secondary | ICD-10-CM | POA: Diagnosis not present

## 2015-03-17 DIAGNOSIS — R946 Abnormal results of thyroid function studies: Secondary | ICD-10-CM | POA: Diagnosis not present

## 2015-03-17 DIAGNOSIS — C9110 Chronic lymphocytic leukemia of B-cell type not having achieved remission: Secondary | ICD-10-CM | POA: Diagnosis not present

## 2015-03-17 DIAGNOSIS — D649 Anemia, unspecified: Secondary | ICD-10-CM | POA: Diagnosis not present

## 2015-03-17 DIAGNOSIS — D696 Thrombocytopenia, unspecified: Secondary | ICD-10-CM | POA: Diagnosis not present

## 2015-03-17 DIAGNOSIS — D709 Neutropenia, unspecified: Secondary | ICD-10-CM | POA: Diagnosis not present

## 2015-03-17 DIAGNOSIS — L659 Nonscarring hair loss, unspecified: Secondary | ICD-10-CM | POA: Diagnosis not present

## 2015-03-29 DIAGNOSIS — K219 Gastro-esophageal reflux disease without esophagitis: Secondary | ICD-10-CM | POA: Diagnosis not present

## 2015-03-29 DIAGNOSIS — C91Z2 Other lymphoid leukemia, in relapse: Secondary | ICD-10-CM | POA: Diagnosis not present

## 2015-03-29 DIAGNOSIS — M810 Age-related osteoporosis without current pathological fracture: Secondary | ICD-10-CM | POA: Diagnosis not present

## 2015-03-29 DIAGNOSIS — Z6833 Body mass index (BMI) 33.0-33.9, adult: Secondary | ICD-10-CM | POA: Diagnosis not present

## 2015-03-29 DIAGNOSIS — E8881 Metabolic syndrome: Secondary | ICD-10-CM | POA: Diagnosis not present

## 2015-03-29 DIAGNOSIS — J309 Allergic rhinitis, unspecified: Secondary | ICD-10-CM | POA: Diagnosis not present

## 2015-03-29 DIAGNOSIS — R600 Localized edema: Secondary | ICD-10-CM | POA: Diagnosis not present

## 2015-03-29 DIAGNOSIS — E781 Pure hyperglyceridemia: Secondary | ICD-10-CM | POA: Diagnosis not present

## 2015-03-29 DIAGNOSIS — Z720 Tobacco use: Secondary | ICD-10-CM | POA: Diagnosis not present

## 2015-03-29 DIAGNOSIS — I10 Essential (primary) hypertension: Secondary | ICD-10-CM | POA: Diagnosis not present

## 2015-03-29 DIAGNOSIS — M159 Polyosteoarthritis, unspecified: Secondary | ICD-10-CM | POA: Diagnosis not present

## 2015-04-04 DIAGNOSIS — Z1231 Encounter for screening mammogram for malignant neoplasm of breast: Secondary | ICD-10-CM | POA: Diagnosis not present

## 2015-04-14 DIAGNOSIS — D801 Nonfamilial hypogammaglobulinemia: Secondary | ICD-10-CM | POA: Diagnosis not present

## 2015-04-14 DIAGNOSIS — E876 Hypokalemia: Secondary | ICD-10-CM

## 2015-04-14 DIAGNOSIS — R946 Abnormal results of thyroid function studies: Secondary | ICD-10-CM | POA: Diagnosis not present

## 2015-04-14 DIAGNOSIS — M179 Osteoarthritis of knee, unspecified: Secondary | ICD-10-CM

## 2015-04-14 DIAGNOSIS — C9110 Chronic lymphocytic leukemia of B-cell type not having achieved remission: Secondary | ICD-10-CM | POA: Diagnosis not present

## 2015-04-14 DIAGNOSIS — D61818 Other pancytopenia: Secondary | ICD-10-CM | POA: Diagnosis not present

## 2015-04-26 DIAGNOSIS — K219 Gastro-esophageal reflux disease without esophagitis: Secondary | ICD-10-CM | POA: Diagnosis not present

## 2015-04-26 DIAGNOSIS — I10 Essential (primary) hypertension: Secondary | ICD-10-CM | POA: Diagnosis not present

## 2015-04-26 DIAGNOSIS — M159 Polyosteoarthritis, unspecified: Secondary | ICD-10-CM | POA: Diagnosis not present

## 2015-04-26 DIAGNOSIS — R600 Localized edema: Secondary | ICD-10-CM | POA: Diagnosis not present

## 2015-04-26 DIAGNOSIS — Z6834 Body mass index (BMI) 34.0-34.9, adult: Secondary | ICD-10-CM | POA: Diagnosis not present

## 2015-04-26 DIAGNOSIS — E8881 Metabolic syndrome: Secondary | ICD-10-CM | POA: Diagnosis not present

## 2015-04-26 DIAGNOSIS — C91Z2 Other lymphoid leukemia, in relapse: Secondary | ICD-10-CM | POA: Diagnosis not present

## 2015-04-26 DIAGNOSIS — Z720 Tobacco use: Secondary | ICD-10-CM | POA: Diagnosis not present

## 2015-04-26 DIAGNOSIS — M810 Age-related osteoporosis without current pathological fracture: Secondary | ICD-10-CM | POA: Diagnosis not present

## 2015-04-26 DIAGNOSIS — R7989 Other specified abnormal findings of blood chemistry: Secondary | ICD-10-CM | POA: Diagnosis not present

## 2015-04-26 DIAGNOSIS — E781 Pure hyperglyceridemia: Secondary | ICD-10-CM | POA: Diagnosis not present

## 2015-04-26 DIAGNOSIS — J309 Allergic rhinitis, unspecified: Secondary | ICD-10-CM | POA: Diagnosis not present

## 2015-05-09 DIAGNOSIS — M810 Age-related osteoporosis without current pathological fracture: Secondary | ICD-10-CM | POA: Diagnosis not present

## 2015-05-09 DIAGNOSIS — C91Z2 Other lymphoid leukemia, in relapse: Secondary | ICD-10-CM | POA: Diagnosis not present

## 2015-05-09 DIAGNOSIS — Z6833 Body mass index (BMI) 33.0-33.9, adult: Secondary | ICD-10-CM | POA: Diagnosis not present

## 2015-05-09 DIAGNOSIS — Z720 Tobacco use: Secondary | ICD-10-CM | POA: Diagnosis not present

## 2015-05-09 DIAGNOSIS — M159 Polyosteoarthritis, unspecified: Secondary | ICD-10-CM | POA: Diagnosis not present

## 2015-05-09 DIAGNOSIS — E781 Pure hyperglyceridemia: Secondary | ICD-10-CM | POA: Diagnosis not present

## 2015-05-09 DIAGNOSIS — I10 Essential (primary) hypertension: Secondary | ICD-10-CM | POA: Diagnosis not present

## 2015-05-09 DIAGNOSIS — J309 Allergic rhinitis, unspecified: Secondary | ICD-10-CM | POA: Diagnosis not present

## 2015-05-09 DIAGNOSIS — R600 Localized edema: Secondary | ICD-10-CM | POA: Diagnosis not present

## 2015-05-09 DIAGNOSIS — K219 Gastro-esophageal reflux disease without esophagitis: Secondary | ICD-10-CM | POA: Diagnosis not present

## 2015-05-09 DIAGNOSIS — E8881 Metabolic syndrome: Secondary | ICD-10-CM | POA: Diagnosis not present

## 2015-05-10 DIAGNOSIS — R161 Splenomegaly, not elsewhere classified: Secondary | ICD-10-CM | POA: Diagnosis not present

## 2015-05-10 DIAGNOSIS — C9110 Chronic lymphocytic leukemia of B-cell type not having achieved remission: Secondary | ICD-10-CM | POA: Diagnosis not present

## 2015-05-10 DIAGNOSIS — R599 Enlarged lymph nodes, unspecified: Secondary | ICD-10-CM | POA: Diagnosis not present

## 2015-05-10 DIAGNOSIS — R946 Abnormal results of thyroid function studies: Secondary | ICD-10-CM | POA: Diagnosis not present

## 2015-05-10 DIAGNOSIS — C9190 Lymphoid leukemia, unspecified not having achieved remission: Secondary | ICD-10-CM | POA: Diagnosis not present

## 2015-05-12 DIAGNOSIS — C9210 Chronic myeloid leukemia, BCR/ABL-positive, not having achieved remission: Secondary | ICD-10-CM | POA: Diagnosis not present

## 2015-05-12 DIAGNOSIS — D819 Combined immunodeficiency, unspecified: Secondary | ICD-10-CM | POA: Diagnosis not present

## 2015-05-12 DIAGNOSIS — C9110 Chronic lymphocytic leukemia of B-cell type not having achieved remission: Secondary | ICD-10-CM | POA: Diagnosis not present

## 2015-05-12 DIAGNOSIS — D61818 Other pancytopenia: Secondary | ICD-10-CM | POA: Diagnosis not present

## 2015-05-18 DIAGNOSIS — C9110 Chronic lymphocytic leukemia of B-cell type not having achieved remission: Secondary | ICD-10-CM | POA: Diagnosis not present

## 2015-05-18 DIAGNOSIS — R05 Cough: Secondary | ICD-10-CM | POA: Diagnosis not present

## 2015-06-06 DIAGNOSIS — R600 Localized edema: Secondary | ICD-10-CM | POA: Diagnosis not present

## 2015-06-06 DIAGNOSIS — M810 Age-related osteoporosis without current pathological fracture: Secondary | ICD-10-CM | POA: Diagnosis not present

## 2015-06-06 DIAGNOSIS — E8881 Metabolic syndrome: Secondary | ICD-10-CM | POA: Diagnosis not present

## 2015-06-06 DIAGNOSIS — E781 Pure hyperglyceridemia: Secondary | ICD-10-CM | POA: Diagnosis not present

## 2015-06-06 DIAGNOSIS — I10 Essential (primary) hypertension: Secondary | ICD-10-CM | POA: Diagnosis not present

## 2015-06-06 DIAGNOSIS — M159 Polyosteoarthritis, unspecified: Secondary | ICD-10-CM | POA: Diagnosis not present

## 2015-06-06 DIAGNOSIS — K219 Gastro-esophageal reflux disease without esophagitis: Secondary | ICD-10-CM | POA: Diagnosis not present

## 2015-06-06 DIAGNOSIS — J309 Allergic rhinitis, unspecified: Secondary | ICD-10-CM | POA: Diagnosis not present

## 2015-06-06 DIAGNOSIS — C91Z2 Other lymphoid leukemia, in relapse: Secondary | ICD-10-CM | POA: Diagnosis not present

## 2015-06-06 DIAGNOSIS — Z720 Tobacco use: Secondary | ICD-10-CM | POA: Diagnosis not present

## 2015-06-06 DIAGNOSIS — Z6834 Body mass index (BMI) 34.0-34.9, adult: Secondary | ICD-10-CM | POA: Diagnosis not present

## 2015-06-09 DIAGNOSIS — D61818 Other pancytopenia: Secondary | ICD-10-CM | POA: Diagnosis not present

## 2015-06-09 DIAGNOSIS — D819 Combined immunodeficiency, unspecified: Secondary | ICD-10-CM | POA: Diagnosis not present

## 2015-06-09 DIAGNOSIS — R161 Splenomegaly, not elsewhere classified: Secondary | ICD-10-CM | POA: Diagnosis not present

## 2015-06-09 DIAGNOSIS — C9110 Chronic lymphocytic leukemia of B-cell type not having achieved remission: Secondary | ICD-10-CM | POA: Diagnosis not present

## 2015-06-29 DIAGNOSIS — H2513 Age-related nuclear cataract, bilateral: Secondary | ICD-10-CM | POA: Diagnosis not present

## 2015-06-29 DIAGNOSIS — H34823 Venous engorgement, bilateral: Secondary | ICD-10-CM | POA: Diagnosis not present

## 2015-07-06 DIAGNOSIS — H6592 Unspecified nonsuppurative otitis media, left ear: Secondary | ICD-10-CM | POA: Diagnosis not present

## 2015-07-06 DIAGNOSIS — H6123 Impacted cerumen, bilateral: Secondary | ICD-10-CM | POA: Diagnosis not present

## 2015-07-07 DIAGNOSIS — D819 Combined immunodeficiency, unspecified: Secondary | ICD-10-CM | POA: Diagnosis not present

## 2015-07-07 DIAGNOSIS — C9110 Chronic lymphocytic leukemia of B-cell type not having achieved remission: Secondary | ICD-10-CM | POA: Diagnosis not present

## 2015-07-26 DIAGNOSIS — E669 Obesity, unspecified: Secondary | ICD-10-CM | POA: Diagnosis not present

## 2015-07-26 DIAGNOSIS — C91Z2 Other lymphoid leukemia, in relapse: Secondary | ICD-10-CM | POA: Diagnosis not present

## 2015-07-26 DIAGNOSIS — J309 Allergic rhinitis, unspecified: Secondary | ICD-10-CM | POA: Diagnosis not present

## 2015-07-26 DIAGNOSIS — I10 Essential (primary) hypertension: Secondary | ICD-10-CM | POA: Diagnosis not present

## 2015-07-26 DIAGNOSIS — K219 Gastro-esophageal reflux disease without esophagitis: Secondary | ICD-10-CM | POA: Diagnosis not present

## 2015-07-26 DIAGNOSIS — Z720 Tobacco use: Secondary | ICD-10-CM | POA: Diagnosis not present

## 2015-07-26 DIAGNOSIS — M810 Age-related osteoporosis without current pathological fracture: Secondary | ICD-10-CM | POA: Diagnosis not present

## 2015-07-26 DIAGNOSIS — R600 Localized edema: Secondary | ICD-10-CM | POA: Diagnosis not present

## 2015-07-26 DIAGNOSIS — M159 Polyosteoarthritis, unspecified: Secondary | ICD-10-CM | POA: Diagnosis not present

## 2015-07-26 DIAGNOSIS — Z6837 Body mass index (BMI) 37.0-37.9, adult: Secondary | ICD-10-CM | POA: Diagnosis not present

## 2015-07-26 DIAGNOSIS — E8881 Metabolic syndrome: Secondary | ICD-10-CM | POA: Diagnosis not present

## 2015-07-26 DIAGNOSIS — E781 Pure hyperglyceridemia: Secondary | ICD-10-CM | POA: Diagnosis not present

## 2015-08-03 DIAGNOSIS — H6592 Unspecified nonsuppurative otitis media, left ear: Secondary | ICD-10-CM | POA: Diagnosis not present

## 2015-08-03 DIAGNOSIS — H6983 Other specified disorders of Eustachian tube, bilateral: Secondary | ICD-10-CM | POA: Diagnosis not present

## 2015-08-03 DIAGNOSIS — H906 Mixed conductive and sensorineural hearing loss, bilateral: Secondary | ICD-10-CM | POA: Diagnosis not present

## 2015-08-04 DIAGNOSIS — C9210 Chronic myeloid leukemia, BCR/ABL-positive, not having achieved remission: Secondary | ICD-10-CM | POA: Diagnosis not present

## 2015-08-04 DIAGNOSIS — D819 Combined immunodeficiency, unspecified: Secondary | ICD-10-CM | POA: Diagnosis not present

## 2015-08-08 DIAGNOSIS — C9112 Chronic lymphocytic leukemia of B-cell type in relapse: Secondary | ICD-10-CM | POA: Diagnosis not present

## 2015-08-14 DIAGNOSIS — H6592 Unspecified nonsuppurative otitis media, left ear: Secondary | ICD-10-CM | POA: Diagnosis not present

## 2015-08-14 DIAGNOSIS — H9190 Unspecified hearing loss, unspecified ear: Secondary | ICD-10-CM | POA: Diagnosis not present

## 2015-08-18 DIAGNOSIS — H6982 Other specified disorders of Eustachian tube, left ear: Secondary | ICD-10-CM | POA: Diagnosis not present

## 2015-08-18 DIAGNOSIS — H6520 Chronic serous otitis media, unspecified ear: Secondary | ICD-10-CM | POA: Diagnosis not present

## 2015-08-31 DIAGNOSIS — H6983 Other specified disorders of Eustachian tube, bilateral: Secondary | ICD-10-CM | POA: Diagnosis not present

## 2015-09-01 DIAGNOSIS — C9110 Chronic lymphocytic leukemia of B-cell type not having achieved remission: Secondary | ICD-10-CM | POA: Diagnosis not present

## 2015-09-01 DIAGNOSIS — D61818 Other pancytopenia: Secondary | ICD-10-CM | POA: Diagnosis not present

## 2015-09-01 NOTE — Patient Outreach (Signed)
[facility]) Care Management  09/01/2015  ADJA DADISMAN October 10, 1945 [id number]   Patient requested additional information with EMMI Prevent calls.  Thanks, Ronnell Freshwater. [place], [place] Assistant Phone: [phone] Fax: [phone]

## 2015-09-25 DIAGNOSIS — Z6841 Body Mass Index (BMI) 40.0 and over, adult: Secondary | ICD-10-CM | POA: Diagnosis not present

## 2015-09-25 DIAGNOSIS — M810 Age-related osteoporosis without current pathological fracture: Secondary | ICD-10-CM | POA: Diagnosis not present

## 2015-09-25 DIAGNOSIS — E8881 Metabolic syndrome: Secondary | ICD-10-CM | POA: Diagnosis not present

## 2015-09-25 DIAGNOSIS — E781 Pure hyperglyceridemia: Secondary | ICD-10-CM | POA: Diagnosis not present

## 2015-09-25 DIAGNOSIS — C91Z2 Other lymphoid leukemia, in relapse: Secondary | ICD-10-CM | POA: Diagnosis not present

## 2015-09-25 DIAGNOSIS — M159 Polyosteoarthritis, unspecified: Secondary | ICD-10-CM | POA: Diagnosis not present

## 2015-09-25 DIAGNOSIS — K219 Gastro-esophageal reflux disease without esophagitis: Secondary | ICD-10-CM | POA: Diagnosis not present

## 2015-09-25 DIAGNOSIS — I10 Essential (primary) hypertension: Secondary | ICD-10-CM | POA: Diagnosis not present

## 2015-09-25 DIAGNOSIS — R600 Localized edema: Secondary | ICD-10-CM | POA: Diagnosis not present

## 2015-09-25 DIAGNOSIS — J309 Allergic rhinitis, unspecified: Secondary | ICD-10-CM | POA: Diagnosis not present

## 2015-09-25 DIAGNOSIS — Z720 Tobacco use: Secondary | ICD-10-CM | POA: Diagnosis not present

## 2015-09-29 DIAGNOSIS — C9110 Chronic lymphocytic leukemia of B-cell type not having achieved remission: Secondary | ICD-10-CM | POA: Diagnosis not present

## 2015-09-29 DIAGNOSIS — R161 Splenomegaly, not elsewhere classified: Secondary | ICD-10-CM | POA: Diagnosis not present

## 2015-09-29 DIAGNOSIS — D61818 Other pancytopenia: Secondary | ICD-10-CM | POA: Diagnosis not present

## 2015-09-29 DIAGNOSIS — E860 Dehydration: Secondary | ICD-10-CM | POA: Diagnosis not present

## 2015-10-27 DIAGNOSIS — D819 Combined immunodeficiency, unspecified: Secondary | ICD-10-CM | POA: Diagnosis not present

## 2015-10-27 DIAGNOSIS — C9210 Chronic myeloid leukemia, BCR/ABL-positive, not having achieved remission: Secondary | ICD-10-CM | POA: Diagnosis not present

## 2015-10-27 DIAGNOSIS — C9110 Chronic lymphocytic leukemia of B-cell type not having achieved remission: Secondary | ICD-10-CM | POA: Diagnosis not present

## 2015-12-05 DIAGNOSIS — H903 Sensorineural hearing loss, bilateral: Secondary | ICD-10-CM | POA: Diagnosis not present

## 2015-12-05 DIAGNOSIS — H9190 Unspecified hearing loss, unspecified ear: Secondary | ICD-10-CM | POA: Diagnosis not present

## 2015-12-05 DIAGNOSIS — H6982 Other specified disorders of Eustachian tube, left ear: Secondary | ICD-10-CM | POA: Diagnosis not present

## 2015-12-22 DIAGNOSIS — C9110 Chronic lymphocytic leukemia of B-cell type not having achieved remission: Secondary | ICD-10-CM | POA: Diagnosis not present

## 2015-12-22 DIAGNOSIS — Z23 Encounter for immunization: Secondary | ICD-10-CM | POA: Diagnosis not present

## 2015-12-26 DIAGNOSIS — E781 Pure hyperglyceridemia: Secondary | ICD-10-CM | POA: Diagnosis not present

## 2015-12-26 DIAGNOSIS — J309 Allergic rhinitis, unspecified: Secondary | ICD-10-CM | POA: Diagnosis not present

## 2015-12-26 DIAGNOSIS — I10 Essential (primary) hypertension: Secondary | ICD-10-CM | POA: Diagnosis not present

## 2015-12-26 DIAGNOSIS — E8881 Metabolic syndrome: Secondary | ICD-10-CM | POA: Diagnosis not present

## 2015-12-26 DIAGNOSIS — L0390 Cellulitis, unspecified: Secondary | ICD-10-CM | POA: Diagnosis not present

## 2015-12-26 DIAGNOSIS — C91Z2 Other lymphoid leukemia, in relapse: Secondary | ICD-10-CM | POA: Diagnosis not present

## 2015-12-26 DIAGNOSIS — M810 Age-related osteoporosis without current pathological fracture: Secondary | ICD-10-CM | POA: Diagnosis not present

## 2015-12-26 DIAGNOSIS — Z9181 History of falling: Secondary | ICD-10-CM | POA: Diagnosis not present

## 2015-12-26 DIAGNOSIS — R600 Localized edema: Secondary | ICD-10-CM | POA: Diagnosis not present

## 2015-12-26 DIAGNOSIS — M159 Polyosteoarthritis, unspecified: Secondary | ICD-10-CM | POA: Diagnosis not present

## 2015-12-26 DIAGNOSIS — F17200 Nicotine dependence, unspecified, uncomplicated: Secondary | ICD-10-CM | POA: Diagnosis not present

## 2015-12-26 DIAGNOSIS — K219 Gastro-esophageal reflux disease without esophagitis: Secondary | ICD-10-CM | POA: Diagnosis not present

## 2016-01-05 DIAGNOSIS — E781 Pure hyperglyceridemia: Secondary | ICD-10-CM | POA: Diagnosis not present

## 2016-01-05 DIAGNOSIS — I10 Essential (primary) hypertension: Secondary | ICD-10-CM | POA: Diagnosis not present

## 2016-01-05 DIAGNOSIS — L0390 Cellulitis, unspecified: Secondary | ICD-10-CM | POA: Diagnosis not present

## 2016-01-05 DIAGNOSIS — Z6841 Body Mass Index (BMI) 40.0 and over, adult: Secondary | ICD-10-CM | POA: Diagnosis not present

## 2016-01-05 DIAGNOSIS — R600 Localized edema: Secondary | ICD-10-CM | POA: Diagnosis not present

## 2016-01-05 DIAGNOSIS — M810 Age-related osteoporosis without current pathological fracture: Secondary | ICD-10-CM | POA: Diagnosis not present

## 2016-01-05 DIAGNOSIS — M159 Polyosteoarthritis, unspecified: Secondary | ICD-10-CM | POA: Diagnosis not present

## 2016-01-05 DIAGNOSIS — K219 Gastro-esophageal reflux disease without esophagitis: Secondary | ICD-10-CM | POA: Diagnosis not present

## 2016-01-05 DIAGNOSIS — J309 Allergic rhinitis, unspecified: Secondary | ICD-10-CM | POA: Diagnosis not present

## 2016-01-05 DIAGNOSIS — E8881 Metabolic syndrome: Secondary | ICD-10-CM | POA: Diagnosis not present

## 2016-01-05 DIAGNOSIS — C91Z2 Other lymphoid leukemia, in relapse: Secondary | ICD-10-CM | POA: Diagnosis not present

## 2016-01-05 DIAGNOSIS — Z720 Tobacco use: Secondary | ICD-10-CM | POA: Diagnosis not present

## 2016-01-19 DIAGNOSIS — C9210 Chronic myeloid leukemia, BCR/ABL-positive, not having achieved remission: Secondary | ICD-10-CM | POA: Diagnosis not present

## 2016-02-05 DIAGNOSIS — M170 Bilateral primary osteoarthritis of knee: Secondary | ICD-10-CM | POA: Diagnosis not present

## 2016-02-05 DIAGNOSIS — G629 Polyneuropathy, unspecified: Secondary | ICD-10-CM | POA: Diagnosis not present

## 2016-02-05 DIAGNOSIS — Z720 Tobacco use: Secondary | ICD-10-CM | POA: Diagnosis not present

## 2016-02-05 DIAGNOSIS — G894 Chronic pain syndrome: Secondary | ICD-10-CM | POA: Diagnosis not present

## 2016-02-05 DIAGNOSIS — Z79891 Long term (current) use of opiate analgesic: Secondary | ICD-10-CM | POA: Diagnosis not present

## 2016-02-05 DIAGNOSIS — L97222 Non-pressure chronic ulcer of left calf with fat layer exposed: Secondary | ICD-10-CM | POA: Diagnosis not present

## 2016-02-06 DIAGNOSIS — C91Z2 Other lymphoid leukemia, in relapse: Secondary | ICD-10-CM | POA: Diagnosis not present

## 2016-02-06 DIAGNOSIS — M810 Age-related osteoporosis without current pathological fracture: Secondary | ICD-10-CM | POA: Diagnosis not present

## 2016-02-06 DIAGNOSIS — K219 Gastro-esophageal reflux disease without esophagitis: Secondary | ICD-10-CM | POA: Diagnosis not present

## 2016-02-06 DIAGNOSIS — R600 Localized edema: Secondary | ICD-10-CM | POA: Diagnosis not present

## 2016-02-06 DIAGNOSIS — Z6841 Body Mass Index (BMI) 40.0 and over, adult: Secondary | ICD-10-CM | POA: Diagnosis not present

## 2016-02-06 DIAGNOSIS — J309 Allergic rhinitis, unspecified: Secondary | ICD-10-CM | POA: Diagnosis not present

## 2016-02-06 DIAGNOSIS — Z720 Tobacco use: Secondary | ICD-10-CM | POA: Diagnosis not present

## 2016-02-06 DIAGNOSIS — I10 Essential (primary) hypertension: Secondary | ICD-10-CM | POA: Diagnosis not present

## 2016-02-06 DIAGNOSIS — E8881 Metabolic syndrome: Secondary | ICD-10-CM | POA: Diagnosis not present

## 2016-02-06 DIAGNOSIS — M159 Polyosteoarthritis, unspecified: Secondary | ICD-10-CM | POA: Diagnosis not present

## 2016-02-06 DIAGNOSIS — E781 Pure hyperglyceridemia: Secondary | ICD-10-CM | POA: Diagnosis not present

## 2016-02-19 DIAGNOSIS — D809 Immunodeficiency with predominantly antibody defects, unspecified: Secondary | ICD-10-CM | POA: Diagnosis not present

## 2016-02-19 DIAGNOSIS — C9110 Chronic lymphocytic leukemia of B-cell type not having achieved remission: Secondary | ICD-10-CM | POA: Diagnosis not present

## 2016-03-06 DIAGNOSIS — H9212 Otorrhea, left ear: Secondary | ICD-10-CM | POA: Diagnosis not present

## 2016-03-06 DIAGNOSIS — H6982 Other specified disorders of Eustachian tube, left ear: Secondary | ICD-10-CM | POA: Diagnosis not present

## 2016-03-08 DIAGNOSIS — L97222 Non-pressure chronic ulcer of left calf with fat layer exposed: Secondary | ICD-10-CM | POA: Diagnosis not present

## 2016-03-08 DIAGNOSIS — Z720 Tobacco use: Secondary | ICD-10-CM | POA: Diagnosis not present

## 2016-03-08 DIAGNOSIS — Z79891 Long term (current) use of opiate analgesic: Secondary | ICD-10-CM | POA: Diagnosis not present

## 2016-03-08 DIAGNOSIS — M170 Bilateral primary osteoarthritis of knee: Secondary | ICD-10-CM | POA: Diagnosis not present

## 2016-03-08 DIAGNOSIS — G629 Polyneuropathy, unspecified: Secondary | ICD-10-CM | POA: Diagnosis not present

## 2016-03-08 DIAGNOSIS — G894 Chronic pain syndrome: Secondary | ICD-10-CM | POA: Diagnosis not present

## 2016-03-11 DIAGNOSIS — L03115 Cellulitis of right lower limb: Secondary | ICD-10-CM | POA: Diagnosis not present

## 2016-03-11 DIAGNOSIS — F17200 Nicotine dependence, unspecified, uncomplicated: Secondary | ICD-10-CM | POA: Diagnosis not present

## 2016-03-11 DIAGNOSIS — M1990 Unspecified osteoarthritis, unspecified site: Secondary | ICD-10-CM | POA: Diagnosis not present

## 2016-03-18 DIAGNOSIS — L97212 Non-pressure chronic ulcer of right calf with fat layer exposed: Secondary | ICD-10-CM | POA: Diagnosis not present

## 2016-03-18 DIAGNOSIS — L03115 Cellulitis of right lower limb: Secondary | ICD-10-CM | POA: Diagnosis not present

## 2016-03-18 DIAGNOSIS — I87311 Chronic venous hypertension (idiopathic) with ulcer of right lower extremity: Secondary | ICD-10-CM | POA: Diagnosis not present

## 2016-03-18 DIAGNOSIS — I872 Venous insufficiency (chronic) (peripheral): Secondary | ICD-10-CM | POA: Diagnosis not present

## 2016-03-22 DIAGNOSIS — C9110 Chronic lymphocytic leukemia of B-cell type not having achieved remission: Secondary | ICD-10-CM | POA: Diagnosis not present

## 2016-03-22 DIAGNOSIS — D819 Combined immunodeficiency, unspecified: Secondary | ICD-10-CM | POA: Diagnosis not present

## 2016-03-27 DIAGNOSIS — C9110 Chronic lymphocytic leukemia of B-cell type not having achieved remission: Secondary | ICD-10-CM | POA: Diagnosis not present

## 2016-03-27 DIAGNOSIS — R509 Fever, unspecified: Secondary | ICD-10-CM | POA: Diagnosis not present

## 2016-03-27 DIAGNOSIS — R05 Cough: Secondary | ICD-10-CM | POA: Diagnosis not present

## 2016-03-29 DIAGNOSIS — D819 Combined immunodeficiency, unspecified: Secondary | ICD-10-CM | POA: Diagnosis not present

## 2016-03-29 DIAGNOSIS — C9110 Chronic lymphocytic leukemia of B-cell type not having achieved remission: Secondary | ICD-10-CM | POA: Diagnosis not present

## 2016-04-02 DIAGNOSIS — I872 Venous insufficiency (chronic) (peripheral): Secondary | ICD-10-CM | POA: Diagnosis not present

## 2016-04-02 DIAGNOSIS — M79604 Pain in right leg: Secondary | ICD-10-CM | POA: Diagnosis not present

## 2016-04-02 DIAGNOSIS — M79605 Pain in left leg: Secondary | ICD-10-CM | POA: Diagnosis not present

## 2016-04-03 DIAGNOSIS — H9212 Otorrhea, left ear: Secondary | ICD-10-CM | POA: Diagnosis not present

## 2016-04-03 DIAGNOSIS — H6982 Other specified disorders of Eustachian tube, left ear: Secondary | ICD-10-CM | POA: Diagnosis not present

## 2016-04-03 DIAGNOSIS — R079 Chest pain, unspecified: Secondary | ICD-10-CM | POA: Diagnosis not present

## 2016-04-05 DIAGNOSIS — Z6841 Body Mass Index (BMI) 40.0 and over, adult: Secondary | ICD-10-CM | POA: Diagnosis not present

## 2016-04-05 DIAGNOSIS — M159 Polyosteoarthritis, unspecified: Secondary | ICD-10-CM | POA: Diagnosis not present

## 2016-04-05 DIAGNOSIS — R590 Localized enlarged lymph nodes: Secondary | ICD-10-CM | POA: Diagnosis not present

## 2016-04-05 DIAGNOSIS — C91Z2 Other lymphoid leukemia, in relapse: Secondary | ICD-10-CM | POA: Diagnosis not present

## 2016-04-05 DIAGNOSIS — R161 Splenomegaly, not elsewhere classified: Secondary | ICD-10-CM | POA: Diagnosis not present

## 2016-04-05 DIAGNOSIS — R0600 Dyspnea, unspecified: Secondary | ICD-10-CM | POA: Diagnosis not present

## 2016-04-05 DIAGNOSIS — E8881 Metabolic syndrome: Secondary | ICD-10-CM | POA: Diagnosis not present

## 2016-04-05 DIAGNOSIS — E781 Pure hyperglyceridemia: Secondary | ICD-10-CM | POA: Diagnosis not present

## 2016-04-05 DIAGNOSIS — R600 Localized edema: Secondary | ICD-10-CM | POA: Diagnosis not present

## 2016-04-05 DIAGNOSIS — K219 Gastro-esophageal reflux disease without esophagitis: Secondary | ICD-10-CM | POA: Diagnosis not present

## 2016-04-05 DIAGNOSIS — M810 Age-related osteoporosis without current pathological fracture: Secondary | ICD-10-CM | POA: Diagnosis not present

## 2016-04-05 DIAGNOSIS — I10 Essential (primary) hypertension: Secondary | ICD-10-CM | POA: Diagnosis not present

## 2016-04-05 DIAGNOSIS — J309 Allergic rhinitis, unspecified: Secondary | ICD-10-CM | POA: Diagnosis not present

## 2016-04-08 DIAGNOSIS — D696 Thrombocytopenia, unspecified: Secondary | ICD-10-CM | POA: Diagnosis not present

## 2016-04-08 DIAGNOSIS — Z79891 Long term (current) use of opiate analgesic: Secondary | ICD-10-CM | POA: Diagnosis not present

## 2016-04-08 DIAGNOSIS — D801 Nonfamilial hypogammaglobulinemia: Secondary | ICD-10-CM | POA: Diagnosis not present

## 2016-04-08 DIAGNOSIS — M170 Bilateral primary osteoarthritis of knee: Secondary | ICD-10-CM | POA: Diagnosis not present

## 2016-04-08 DIAGNOSIS — C9110 Chronic lymphocytic leukemia of B-cell type not having achieved remission: Secondary | ICD-10-CM | POA: Diagnosis not present

## 2016-04-08 DIAGNOSIS — R0600 Dyspnea, unspecified: Secondary | ICD-10-CM

## 2016-04-08 DIAGNOSIS — Z720 Tobacco use: Secondary | ICD-10-CM | POA: Diagnosis not present

## 2016-04-08 DIAGNOSIS — L97222 Non-pressure chronic ulcer of left calf with fat layer exposed: Secondary | ICD-10-CM | POA: Diagnosis not present

## 2016-04-08 DIAGNOSIS — G629 Polyneuropathy, unspecified: Secondary | ICD-10-CM | POA: Diagnosis not present

## 2016-04-08 DIAGNOSIS — C50412 Malignant neoplasm of upper-outer quadrant of left female breast: Secondary | ICD-10-CM | POA: Diagnosis not present

## 2016-04-08 DIAGNOSIS — D649 Anemia, unspecified: Secondary | ICD-10-CM | POA: Diagnosis not present

## 2016-04-08 DIAGNOSIS — G894 Chronic pain syndrome: Secondary | ICD-10-CM | POA: Diagnosis not present

## 2016-04-09 DIAGNOSIS — I872 Venous insufficiency (chronic) (peripheral): Secondary | ICD-10-CM | POA: Diagnosis not present

## 2016-04-09 DIAGNOSIS — L97811 Non-pressure chronic ulcer of other part of right lower leg limited to breakdown of skin: Secondary | ICD-10-CM | POA: Diagnosis not present

## 2016-04-09 DIAGNOSIS — L03115 Cellulitis of right lower limb: Secondary | ICD-10-CM | POA: Diagnosis not present

## 2016-04-09 DIAGNOSIS — L97812 Non-pressure chronic ulcer of other part of right lower leg with fat layer exposed: Secondary | ICD-10-CM | POA: Diagnosis not present

## 2016-04-11 DIAGNOSIS — R600 Localized edema: Secondary | ICD-10-CM | POA: Diagnosis not present

## 2016-04-11 DIAGNOSIS — I872 Venous insufficiency (chronic) (peripheral): Secondary | ICD-10-CM | POA: Diagnosis not present

## 2016-04-12 DIAGNOSIS — J309 Allergic rhinitis, unspecified: Secondary | ICD-10-CM | POA: Diagnosis not present

## 2016-04-12 DIAGNOSIS — R0600 Dyspnea, unspecified: Secondary | ICD-10-CM | POA: Diagnosis not present

## 2016-04-12 DIAGNOSIS — C91Z2 Other lymphoid leukemia, in relapse: Secondary | ICD-10-CM | POA: Diagnosis not present

## 2016-04-12 DIAGNOSIS — Z720 Tobacco use: Secondary | ICD-10-CM | POA: Diagnosis not present

## 2016-04-12 DIAGNOSIS — E781 Pure hyperglyceridemia: Secondary | ICD-10-CM | POA: Diagnosis not present

## 2016-04-12 DIAGNOSIS — K219 Gastro-esophageal reflux disease without esophagitis: Secondary | ICD-10-CM | POA: Diagnosis not present

## 2016-04-12 DIAGNOSIS — M159 Polyosteoarthritis, unspecified: Secondary | ICD-10-CM | POA: Diagnosis not present

## 2016-04-12 DIAGNOSIS — R600 Localized edema: Secondary | ICD-10-CM | POA: Diagnosis not present

## 2016-04-12 DIAGNOSIS — M810 Age-related osteoporosis without current pathological fracture: Secondary | ICD-10-CM | POA: Diagnosis not present

## 2016-04-12 DIAGNOSIS — E8881 Metabolic syndrome: Secondary | ICD-10-CM | POA: Diagnosis not present

## 2016-04-12 DIAGNOSIS — I10 Essential (primary) hypertension: Secondary | ICD-10-CM | POA: Diagnosis not present

## 2016-04-16 DIAGNOSIS — L97819 Non-pressure chronic ulcer of other part of right lower leg with unspecified severity: Secondary | ICD-10-CM | POA: Diagnosis not present

## 2016-04-16 DIAGNOSIS — I872 Venous insufficiency (chronic) (peripheral): Secondary | ICD-10-CM | POA: Diagnosis not present

## 2016-04-16 DIAGNOSIS — Z881 Allergy status to other antibiotic agents status: Secondary | ICD-10-CM | POA: Diagnosis not present

## 2016-04-16 DIAGNOSIS — M1990 Unspecified osteoarthritis, unspecified site: Secondary | ICD-10-CM | POA: Diagnosis not present

## 2016-04-16 DIAGNOSIS — D649 Anemia, unspecified: Secondary | ICD-10-CM | POA: Diagnosis not present

## 2016-04-16 DIAGNOSIS — K529 Noninfective gastroenteritis and colitis, unspecified: Secondary | ICD-10-CM | POA: Diagnosis not present

## 2016-04-16 DIAGNOSIS — F17210 Nicotine dependence, cigarettes, uncomplicated: Secondary | ICD-10-CM | POA: Diagnosis not present

## 2016-04-16 DIAGNOSIS — Z882 Allergy status to sulfonamides status: Secondary | ICD-10-CM | POA: Diagnosis not present

## 2016-04-16 DIAGNOSIS — Z9221 Personal history of antineoplastic chemotherapy: Secondary | ICD-10-CM | POA: Diagnosis not present

## 2016-04-16 DIAGNOSIS — Z79899 Other long term (current) drug therapy: Secondary | ICD-10-CM | POA: Diagnosis not present

## 2016-04-16 DIAGNOSIS — Z888 Allergy status to other drugs, medicaments and biological substances status: Secondary | ICD-10-CM | POA: Diagnosis not present

## 2016-04-16 DIAGNOSIS — L97811 Non-pressure chronic ulcer of other part of right lower leg limited to breakdown of skin: Secondary | ICD-10-CM | POA: Diagnosis not present

## 2016-04-19 DIAGNOSIS — C9110 Chronic lymphocytic leukemia of B-cell type not having achieved remission: Secondary | ICD-10-CM | POA: Diagnosis not present

## 2016-04-22 DIAGNOSIS — H6982 Other specified disorders of Eustachian tube, left ear: Secondary | ICD-10-CM | POA: Diagnosis not present

## 2016-04-22 DIAGNOSIS — H9212 Otorrhea, left ear: Secondary | ICD-10-CM | POA: Diagnosis not present

## 2016-05-03 ENCOUNTER — Institutional Professional Consult (permissible substitution): Payer: Medicare Other | Admitting: Pulmonary Disease

## 2016-05-03 DIAGNOSIS — C9110 Chronic lymphocytic leukemia of B-cell type not having achieved remission: Secondary | ICD-10-CM | POA: Diagnosis not present

## 2016-05-03 DIAGNOSIS — D819 Combined immunodeficiency, unspecified: Secondary | ICD-10-CM | POA: Diagnosis not present

## 2016-05-06 DIAGNOSIS — H6982 Other specified disorders of Eustachian tube, left ear: Secondary | ICD-10-CM | POA: Diagnosis not present

## 2016-05-08 DIAGNOSIS — Z720 Tobacco use: Secondary | ICD-10-CM | POA: Diagnosis not present

## 2016-05-08 DIAGNOSIS — R600 Localized edema: Secondary | ICD-10-CM | POA: Diagnosis not present

## 2016-05-08 DIAGNOSIS — J309 Allergic rhinitis, unspecified: Secondary | ICD-10-CM | POA: Diagnosis not present

## 2016-05-08 DIAGNOSIS — Z79891 Long term (current) use of opiate analgesic: Secondary | ICD-10-CM | POA: Diagnosis not present

## 2016-05-08 DIAGNOSIS — R0600 Dyspnea, unspecified: Secondary | ICD-10-CM | POA: Diagnosis not present

## 2016-05-08 DIAGNOSIS — I10 Essential (primary) hypertension: Secondary | ICD-10-CM | POA: Diagnosis not present

## 2016-05-08 DIAGNOSIS — C91Z2 Other lymphoid leukemia, in relapse: Secondary | ICD-10-CM | POA: Diagnosis not present

## 2016-05-08 DIAGNOSIS — L97222 Non-pressure chronic ulcer of left calf with fat layer exposed: Secondary | ICD-10-CM | POA: Diagnosis not present

## 2016-05-08 DIAGNOSIS — M170 Bilateral primary osteoarthritis of knee: Secondary | ICD-10-CM | POA: Diagnosis not present

## 2016-05-08 DIAGNOSIS — K219 Gastro-esophageal reflux disease without esophagitis: Secondary | ICD-10-CM | POA: Diagnosis not present

## 2016-05-08 DIAGNOSIS — E8881 Metabolic syndrome: Secondary | ICD-10-CM | POA: Diagnosis not present

## 2016-05-08 DIAGNOSIS — G629 Polyneuropathy, unspecified: Secondary | ICD-10-CM | POA: Diagnosis not present

## 2016-05-08 DIAGNOSIS — E781 Pure hyperglyceridemia: Secondary | ICD-10-CM | POA: Diagnosis not present

## 2016-05-08 DIAGNOSIS — M159 Polyosteoarthritis, unspecified: Secondary | ICD-10-CM | POA: Diagnosis not present

## 2016-05-08 DIAGNOSIS — M810 Age-related osteoporosis without current pathological fracture: Secondary | ICD-10-CM | POA: Diagnosis not present

## 2016-05-08 DIAGNOSIS — G894 Chronic pain syndrome: Secondary | ICD-10-CM | POA: Diagnosis not present

## 2016-05-10 ENCOUNTER — Ambulatory Visit (INDEPENDENT_AMBULATORY_CARE_PROVIDER_SITE_OTHER): Payer: Medicare Other | Admitting: Pulmonary Disease

## 2016-05-10 ENCOUNTER — Encounter: Payer: Self-pay | Admitting: Pulmonary Disease

## 2016-05-10 VITALS — BP 124/62 | HR 86 | Ht 62.0 in | Wt 214.4 lb

## 2016-05-10 DIAGNOSIS — R0602 Shortness of breath: Secondary | ICD-10-CM

## 2016-05-10 NOTE — Patient Instructions (Signed)
To schedule you for pulmonary function tests to be done at [facility]. Please make sure that these results are sent to [name].  Continue to work on smoking cessation extended to the clinic in 3 months

## 2016-05-10 NOTE — Progress Notes (Addendum)
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 She has been on multiple therapies including chlorambucil and prednisone, fludarabine, bendamustine and rituximab with relapse of disease. She was finally placed on a ibrutinib in 2014. This was placed on hold in December 2017 due to neutropenia. At this time she also developed progressive CLL, hypokalemia, severe anemia and thrombocytopenia. She has since resumed ibrutinib with improvement in her CLL, lytes, hemoglobin and platelets  She developed severe dyspnea on exertion in January 2017 which she attributes this to hyperkalemia. This has resolved since she was restarted on her ibrutinib and improvement of her blood counts. In office today she has mild dyspnea on exertion. She denies dyspnea at rest, cough, wheezing, chest pain, palpitations. She has a 30-pack-year smoking history and still smokes occasionally. She claims that she hasn't had a cigarette for the past 4 weeks.  Outpatient Encounter Prescriptions as of 05/10/2016  Medication Sig  . alendronate (FOSAMAX) 70 MG tablet   . benzonatate (TESSALON) 200 MG capsule TAKE ONE CAPSULE 3 TIMES A DAY AS NEEDED FOR COUGH  . cyclobenzaprine (FLEXERIL) 5 MG tablet   . furosemide (LASIX) 40 MG tablet   . IMBRUVICA 140 MG capsul   . KLOR-CON M20 20 MEQ tablet   . metroNIDAZOLE (FLAGYL) 500 MG tablet   . NEOMYCIN-POLYMYXIN-HYDROCORTISONE (CORTISPORIN) 1 % SOLN otic solution   . omeprazole (PRILOSEC) 20 MG capsule   . oxyCODONE-acetaminophen (PERCOCET) 7.5-325 MG tablet   . prochlorperazine (COMPAZINE) 10 MG tablet    No facility-administered encounter medications on file as of 05/10/2016.     Allergies as of 05/10/2016 - Review Complete  05/10/2016  Allergen Reaction Noted  . Sulfa antibiotics  05/10/2016    Past Medical History:  Diagnosis Date  . Cancer [facility])     Past Surgical History:  Procedure Laterality Date  . arm surgery    . GALLBLADDER SURGERY    . TOTAL KNEE ARTHROPLASTY      Family History  Problem Relation Age of Onset  . Cancer Mother   . Hypertension Father   . Hypertension Sister   . Stroke Sister   . Hypertension Brother   . Stroke Brother     Social History   Social History  . Marital status: Divorced    Spouse name: N/A  . Number of children: N/A  . Years of education: N/A   Occupational History  . Not on file.   Social History Main Topics  . Smoking status: Current Every Day Smoker    Packs/day: 0.50    Years: 30.00  . Smokeless tobacco: Never Used  . Alcohol use Not on file  . Drug use: Yes     Comment: occ  . Sexual activity: No   Other Topics Concern  . Not on file   Social History Narrative  . No narrative on file    Review of systems: Review of Systems  Constitutional: Negative for fever and chills.  HENT: Negative.   Eyes: Negative for blurred vision.  Respiratory: as per HPI  Cardiovascular: Negative for chest pain and palpitations.  Gastrointestinal: Negative for vomiting, diarrhea, blood per rectum. Genitourinary: Negative for dysuria, urgency, frequency and hematuria.  Musculoskeletal:  Negative for myalgias, back pain and joint pain.  Skin: Negative for itching and rash.  Neurological: Negative for dizziness, tremors, focal weakness, seizures and loss of consciousness.  Endo/Heme/Allergies: Negative for environmental allergies.  Psychiatric/Behavioral: Negative for depression, suicidal ideas and hallucinations.  All other systems reviewed and are negative.  Physical Exam: Blood pressure 124/62, pulse 86, height 5\' 2"  (1.575 m), weight 214 lb 6.4 oz (97.3 kg), SpO2 97 %. Gen:      No acute distress HEENT:  EOMI, sclera anicteric Neck:     No  masses; no thyromegaly Lungs:    Clear to auscultation bilaterally; normal respiratory effort CV:         Regular rate and rhythm; no murmurs Abd:      + bowel sounds; soft, non-tender; no palpable masses, no distension Ext:    No edema; adequate peripheral perfusion Skin:      Warm and dry; no rash Neuro: alert and oriented x 3 Psych: normal mood and affect  Data Reviewed: CT chest 05/10/15- splenomegaly, diffuse lymphadenopathy, paraseptal emphysematous changes with subpleural bulla in the upper lobes, right lower lobe scarring CT scan 04/05/16- splenomegaly, diffuse lymphadenopathy, paraseptal emphysematous changes with subpleural bulla in the upper lobes, right lower lobe scarring unchanged from previous CT scan. Mild lower lobe atelectasis. I have reviewed the images personally.  Assessment:  Consult for evaluation of dyspnea. 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 She is not very symptomatic in office today and I don't believe she needs to be on long-term inhaler medication. I will schedule her for pulmonary function tests for further evaluation  The timing of dyspnea onset and resolution when ibrutinib was restarted makes it likely that dyspnea is related to CLL progression and severe anemia while she was off therapy. We will continue to monitor.  Active smoker.  She has a heavy smoking history with intermittent relapses. We discussed smoking cessation and I have encouraged her strongly to quit altogether. Time spent counseling-5 minutes.  Plan/Recommendations: - Order full PFTs. DLCO needs to be corrected for Hb - Smoking cessation.  Marshell Garfinkel MD [facility] Pager [phone] 05/10/2016, 1:48 PM  CC: [name], MD

## 2016-05-14 DIAGNOSIS — R0602 Shortness of breath: Secondary | ICD-10-CM | POA: Diagnosis not present

## 2016-05-14 DIAGNOSIS — F17210 Nicotine dependence, cigarettes, uncomplicated: Secondary | ICD-10-CM | POA: Diagnosis not present

## 2016-05-17 DIAGNOSIS — D696 Thrombocytopenia, unspecified: Secondary | ICD-10-CM | POA: Diagnosis not present

## 2016-05-17 DIAGNOSIS — D801 Nonfamilial hypogammaglobulinemia: Secondary | ICD-10-CM | POA: Diagnosis not present

## 2016-05-17 DIAGNOSIS — D819 Combined immunodeficiency, unspecified: Secondary | ICD-10-CM | POA: Diagnosis not present

## 2016-05-17 DIAGNOSIS — D649 Anemia, unspecified: Secondary | ICD-10-CM | POA: Diagnosis not present

## 2016-05-17 DIAGNOSIS — C9110 Chronic lymphocytic leukemia of B-cell type not having achieved remission: Secondary | ICD-10-CM | POA: Diagnosis not present

## 2016-05-17 DIAGNOSIS — R0600 Dyspnea, unspecified: Secondary | ICD-10-CM | POA: Diagnosis not present

## 2016-05-27 DIAGNOSIS — Z1231 Encounter for screening mammogram for malignant neoplasm of breast: Secondary | ICD-10-CM | POA: Diagnosis not present

## 2016-05-30 DIAGNOSIS — I10 Essential (primary) hypertension: Secondary | ICD-10-CM | POA: Diagnosis not present

## 2016-05-30 DIAGNOSIS — M159 Polyosteoarthritis, unspecified: Secondary | ICD-10-CM | POA: Diagnosis not present

## 2016-05-30 DIAGNOSIS — K219 Gastro-esophageal reflux disease without esophagitis: Secondary | ICD-10-CM | POA: Diagnosis not present

## 2016-05-30 DIAGNOSIS — E781 Pure hyperglyceridemia: Secondary | ICD-10-CM | POA: Diagnosis not present

## 2016-05-30 DIAGNOSIS — R0600 Dyspnea, unspecified: Secondary | ICD-10-CM | POA: Diagnosis not present

## 2016-05-30 DIAGNOSIS — L0390 Cellulitis, unspecified: Secondary | ICD-10-CM | POA: Diagnosis not present

## 2016-05-30 DIAGNOSIS — R600 Localized edema: Secondary | ICD-10-CM | POA: Diagnosis not present

## 2016-05-30 DIAGNOSIS — Z720 Tobacco use: Secondary | ICD-10-CM | POA: Diagnosis not present

## 2016-05-30 DIAGNOSIS — C9192 Lymphoid leukemia, unspecified, in relapse: Secondary | ICD-10-CM | POA: Diagnosis not present

## 2016-05-30 DIAGNOSIS — J309 Allergic rhinitis, unspecified: Secondary | ICD-10-CM | POA: Diagnosis not present

## 2016-05-30 DIAGNOSIS — E8881 Metabolic syndrome: Secondary | ICD-10-CM | POA: Diagnosis not present

## 2016-05-30 DIAGNOSIS — M810 Age-related osteoporosis without current pathological fracture: Secondary | ICD-10-CM | POA: Diagnosis not present

## 2016-05-31 DIAGNOSIS — C9110 Chronic lymphocytic leukemia of B-cell type not having achieved remission: Secondary | ICD-10-CM | POA: Diagnosis not present

## 2016-05-31 DIAGNOSIS — D696 Thrombocytopenia, unspecified: Secondary | ICD-10-CM | POA: Diagnosis not present

## 2016-05-31 DIAGNOSIS — F17200 Nicotine dependence, unspecified, uncomplicated: Secondary | ICD-10-CM | POA: Diagnosis not present

## 2016-05-31 DIAGNOSIS — L259 Unspecified contact dermatitis, unspecified cause: Secondary | ICD-10-CM | POA: Diagnosis not present

## 2016-06-04 DIAGNOSIS — L259 Unspecified contact dermatitis, unspecified cause: Secondary | ICD-10-CM | POA: Diagnosis not present

## 2016-06-05 DIAGNOSIS — K219 Gastro-esophageal reflux disease without esophagitis: Secondary | ICD-10-CM | POA: Diagnosis not present

## 2016-06-05 DIAGNOSIS — C9192 Lymphoid leukemia, unspecified, in relapse: Secondary | ICD-10-CM | POA: Diagnosis not present

## 2016-06-05 DIAGNOSIS — L0390 Cellulitis, unspecified: Secondary | ICD-10-CM | POA: Diagnosis not present

## 2016-06-05 DIAGNOSIS — J309 Allergic rhinitis, unspecified: Secondary | ICD-10-CM | POA: Diagnosis not present

## 2016-06-05 DIAGNOSIS — M810 Age-related osteoporosis without current pathological fracture: Secondary | ICD-10-CM | POA: Diagnosis not present

## 2016-06-05 DIAGNOSIS — E781 Pure hyperglyceridemia: Secondary | ICD-10-CM | POA: Diagnosis not present

## 2016-06-05 DIAGNOSIS — M159 Polyosteoarthritis, unspecified: Secondary | ICD-10-CM | POA: Diagnosis not present

## 2016-06-05 DIAGNOSIS — R600 Localized edema: Secondary | ICD-10-CM | POA: Diagnosis not present

## 2016-06-05 DIAGNOSIS — E8881 Metabolic syndrome: Secondary | ICD-10-CM | POA: Diagnosis not present

## 2016-06-05 DIAGNOSIS — I10 Essential (primary) hypertension: Secondary | ICD-10-CM | POA: Diagnosis not present

## 2016-06-05 DIAGNOSIS — Z720 Tobacco use: Secondary | ICD-10-CM | POA: Diagnosis not present

## 2016-06-05 DIAGNOSIS — R0600 Dyspnea, unspecified: Secondary | ICD-10-CM | POA: Diagnosis not present

## 2016-06-06 DIAGNOSIS — Z720 Tobacco use: Secondary | ICD-10-CM | POA: Diagnosis not present

## 2016-06-06 DIAGNOSIS — M170 Bilateral primary osteoarthritis of knee: Secondary | ICD-10-CM | POA: Diagnosis not present

## 2016-06-06 DIAGNOSIS — G629 Polyneuropathy, unspecified: Secondary | ICD-10-CM | POA: Diagnosis not present

## 2016-06-06 DIAGNOSIS — L97222 Non-pressure chronic ulcer of left calf with fat layer exposed: Secondary | ICD-10-CM | POA: Diagnosis not present

## 2016-06-06 DIAGNOSIS — Z79891 Long term (current) use of opiate analgesic: Secondary | ICD-10-CM | POA: Diagnosis not present

## 2016-06-06 DIAGNOSIS — G894 Chronic pain syndrome: Secondary | ICD-10-CM | POA: Diagnosis not present

## 2016-06-07 DIAGNOSIS — L259 Unspecified contact dermatitis, unspecified cause: Secondary | ICD-10-CM | POA: Diagnosis not present

## 2016-06-11 DIAGNOSIS — L259 Unspecified contact dermatitis, unspecified cause: Secondary | ICD-10-CM | POA: Diagnosis not present

## 2016-06-14 DIAGNOSIS — L259 Unspecified contact dermatitis, unspecified cause: Secondary | ICD-10-CM | POA: Diagnosis not present

## 2016-06-18 DIAGNOSIS — L259 Unspecified contact dermatitis, unspecified cause: Secondary | ICD-10-CM | POA: Diagnosis not present

## 2016-06-20 DIAGNOSIS — L259 Unspecified contact dermatitis, unspecified cause: Secondary | ICD-10-CM | POA: Diagnosis not present

## 2016-06-21 DIAGNOSIS — C9110 Chronic lymphocytic leukemia of B-cell type not having achieved remission: Secondary | ICD-10-CM | POA: Diagnosis not present

## 2016-06-25 DIAGNOSIS — Z09 Encounter for follow-up examination after completed treatment for conditions other than malignant neoplasm: Secondary | ICD-10-CM | POA: Diagnosis not present

## 2016-06-25 DIAGNOSIS — L259 Unspecified contact dermatitis, unspecified cause: Secondary | ICD-10-CM | POA: Diagnosis not present

## 2016-07-08 DIAGNOSIS — G629 Polyneuropathy, unspecified: Secondary | ICD-10-CM | POA: Diagnosis not present

## 2016-07-08 DIAGNOSIS — G894 Chronic pain syndrome: Secondary | ICD-10-CM | POA: Diagnosis not present

## 2016-07-08 DIAGNOSIS — Z720 Tobacco use: Secondary | ICD-10-CM | POA: Diagnosis not present

## 2016-07-08 DIAGNOSIS — Z79891 Long term (current) use of opiate analgesic: Secondary | ICD-10-CM | POA: Diagnosis not present

## 2016-07-08 DIAGNOSIS — M170 Bilateral primary osteoarthritis of knee: Secondary | ICD-10-CM | POA: Diagnosis not present

## 2016-07-08 DIAGNOSIS — L97222 Non-pressure chronic ulcer of left calf with fat layer exposed: Secondary | ICD-10-CM | POA: Diagnosis not present

## 2016-07-17 DIAGNOSIS — M159 Polyosteoarthritis, unspecified: Secondary | ICD-10-CM | POA: Diagnosis not present

## 2016-07-17 DIAGNOSIS — C9192 Lymphoid leukemia, unspecified, in relapse: Secondary | ICD-10-CM | POA: Diagnosis not present

## 2016-07-17 DIAGNOSIS — E8881 Metabolic syndrome: Secondary | ICD-10-CM | POA: Diagnosis not present

## 2016-07-17 DIAGNOSIS — R0600 Dyspnea, unspecified: Secondary | ICD-10-CM | POA: Diagnosis not present

## 2016-07-17 DIAGNOSIS — E781 Pure hyperglyceridemia: Secondary | ICD-10-CM | POA: Diagnosis not present

## 2016-07-17 DIAGNOSIS — J309 Allergic rhinitis, unspecified: Secondary | ICD-10-CM | POA: Diagnosis not present

## 2016-07-17 DIAGNOSIS — M810 Age-related osteoporosis without current pathological fracture: Secondary | ICD-10-CM | POA: Diagnosis not present

## 2016-07-17 DIAGNOSIS — I10 Essential (primary) hypertension: Secondary | ICD-10-CM | POA: Diagnosis not present

## 2016-07-17 DIAGNOSIS — Z720 Tobacco use: Secondary | ICD-10-CM | POA: Diagnosis not present

## 2016-07-17 DIAGNOSIS — R600 Localized edema: Secondary | ICD-10-CM | POA: Diagnosis not present

## 2016-07-17 DIAGNOSIS — K219 Gastro-esophageal reflux disease without esophagitis: Secondary | ICD-10-CM | POA: Diagnosis not present

## 2016-07-19 DIAGNOSIS — D649 Anemia, unspecified: Secondary | ICD-10-CM | POA: Diagnosis not present

## 2016-07-19 DIAGNOSIS — C9110 Chronic lymphocytic leukemia of B-cell type not having achieved remission: Secondary | ICD-10-CM | POA: Diagnosis not present

## 2016-07-19 DIAGNOSIS — D819 Combined immunodeficiency, unspecified: Secondary | ICD-10-CM | POA: Diagnosis not present

## 2016-07-19 DIAGNOSIS — D696 Thrombocytopenia, unspecified: Secondary | ICD-10-CM | POA: Diagnosis not present

## 2016-07-19 DIAGNOSIS — D801 Nonfamilial hypogammaglobulinemia: Secondary | ICD-10-CM | POA: Diagnosis not present

## 2016-08-07 DIAGNOSIS — L97222 Non-pressure chronic ulcer of left calf with fat layer exposed: Secondary | ICD-10-CM | POA: Diagnosis not present

## 2016-08-07 DIAGNOSIS — Z720 Tobacco use: Secondary | ICD-10-CM | POA: Diagnosis not present

## 2016-08-07 DIAGNOSIS — G629 Polyneuropathy, unspecified: Secondary | ICD-10-CM | POA: Diagnosis not present

## 2016-08-07 DIAGNOSIS — M170 Bilateral primary osteoarthritis of knee: Secondary | ICD-10-CM | POA: Diagnosis not present

## 2016-08-07 DIAGNOSIS — Z79891 Long term (current) use of opiate analgesic: Secondary | ICD-10-CM | POA: Diagnosis not present

## 2016-08-07 DIAGNOSIS — G894 Chronic pain syndrome: Secondary | ICD-10-CM | POA: Diagnosis not present

## 2016-08-16 DIAGNOSIS — D819 Combined immunodeficiency, unspecified: Secondary | ICD-10-CM | POA: Diagnosis not present

## 2016-08-16 DIAGNOSIS — C9110 Chronic lymphocytic leukemia of B-cell type not having achieved remission: Secondary | ICD-10-CM | POA: Diagnosis not present

## 2016-08-19 ENCOUNTER — Ambulatory Visit: Payer: Medicare Other | Admitting: Pulmonary Disease

## 2016-08-19 DIAGNOSIS — L97812 Non-pressure chronic ulcer of other part of right lower leg with fat layer exposed: Secondary | ICD-10-CM | POA: Diagnosis not present

## 2016-08-19 DIAGNOSIS — I87311 Chronic venous hypertension (idiopathic) with ulcer of right lower extremity: Secondary | ICD-10-CM | POA: Diagnosis not present

## 2016-08-19 DIAGNOSIS — M1990 Unspecified osteoarthritis, unspecified site: Secondary | ICD-10-CM | POA: Diagnosis not present

## 2016-08-19 DIAGNOSIS — F17200 Nicotine dependence, unspecified, uncomplicated: Secondary | ICD-10-CM | POA: Diagnosis not present

## 2016-08-21 DIAGNOSIS — I87311 Chronic venous hypertension (idiopathic) with ulcer of right lower extremity: Secondary | ICD-10-CM | POA: Diagnosis not present

## 2016-08-21 DIAGNOSIS — L97812 Non-pressure chronic ulcer of other part of right lower leg with fat layer exposed: Secondary | ICD-10-CM | POA: Diagnosis not present

## 2016-08-23 DIAGNOSIS — L97812 Non-pressure chronic ulcer of other part of right lower leg with fat layer exposed: Secondary | ICD-10-CM | POA: Diagnosis not present

## 2016-08-23 DIAGNOSIS — I87311 Chronic venous hypertension (idiopathic) with ulcer of right lower extremity: Secondary | ICD-10-CM | POA: Diagnosis not present

## 2016-08-28 DIAGNOSIS — I87311 Chronic venous hypertension (idiopathic) with ulcer of right lower extremity: Secondary | ICD-10-CM | POA: Diagnosis not present

## 2016-08-28 DIAGNOSIS — L97812 Non-pressure chronic ulcer of other part of right lower leg with fat layer exposed: Secondary | ICD-10-CM | POA: Diagnosis not present

## 2016-09-02 DIAGNOSIS — L97812 Non-pressure chronic ulcer of other part of right lower leg with fat layer exposed: Secondary | ICD-10-CM | POA: Diagnosis not present

## 2016-09-02 DIAGNOSIS — I87311 Chronic venous hypertension (idiopathic) with ulcer of right lower extremity: Secondary | ICD-10-CM | POA: Diagnosis not present

## 2016-09-05 DIAGNOSIS — I872 Venous insufficiency (chronic) (peripheral): Secondary | ICD-10-CM | POA: Diagnosis not present

## 2016-09-05 DIAGNOSIS — I87311 Chronic venous hypertension (idiopathic) with ulcer of right lower extremity: Secondary | ICD-10-CM | POA: Diagnosis not present

## 2016-09-05 DIAGNOSIS — L97812 Non-pressure chronic ulcer of other part of right lower leg with fat layer exposed: Secondary | ICD-10-CM | POA: Diagnosis not present

## 2016-09-09 DIAGNOSIS — M170 Bilateral primary osteoarthritis of knee: Secondary | ICD-10-CM | POA: Diagnosis not present

## 2016-09-09 DIAGNOSIS — G629 Polyneuropathy, unspecified: Secondary | ICD-10-CM | POA: Diagnosis not present

## 2016-09-09 DIAGNOSIS — Z79891 Long term (current) use of opiate analgesic: Secondary | ICD-10-CM | POA: Diagnosis not present

## 2016-09-09 DIAGNOSIS — Z720 Tobacco use: Secondary | ICD-10-CM | POA: Diagnosis not present

## 2016-09-09 DIAGNOSIS — G894 Chronic pain syndrome: Secondary | ICD-10-CM | POA: Diagnosis not present

## 2016-09-09 DIAGNOSIS — L97222 Non-pressure chronic ulcer of left calf with fat layer exposed: Secondary | ICD-10-CM | POA: Diagnosis not present

## 2016-09-11 DIAGNOSIS — I872 Venous insufficiency (chronic) (peripheral): Secondary | ICD-10-CM | POA: Diagnosis not present

## 2016-09-11 DIAGNOSIS — L97812 Non-pressure chronic ulcer of other part of right lower leg with fat layer exposed: Secondary | ICD-10-CM | POA: Diagnosis not present

## 2016-09-13 DIAGNOSIS — R531 Weakness: Secondary | ICD-10-CM | POA: Diagnosis not present

## 2016-09-13 DIAGNOSIS — C9110 Chronic lymphocytic leukemia of B-cell type not having achieved remission: Secondary | ICD-10-CM | POA: Diagnosis not present

## 2016-09-16 DIAGNOSIS — L97812 Non-pressure chronic ulcer of other part of right lower leg with fat layer exposed: Secondary | ICD-10-CM | POA: Diagnosis not present

## 2016-09-16 DIAGNOSIS — I87311 Chronic venous hypertension (idiopathic) with ulcer of right lower extremity: Secondary | ICD-10-CM | POA: Diagnosis not present

## 2016-09-24 DIAGNOSIS — I87311 Chronic venous hypertension (idiopathic) with ulcer of right lower extremity: Secondary | ICD-10-CM | POA: Diagnosis not present

## 2016-09-24 DIAGNOSIS — Z09 Encounter for follow-up examination after completed treatment for conditions other than malignant neoplasm: Secondary | ICD-10-CM | POA: Diagnosis not present

## 2016-09-24 DIAGNOSIS — L97819 Non-pressure chronic ulcer of other part of right lower leg with unspecified severity: Secondary | ICD-10-CM | POA: Diagnosis not present

## 2016-10-07 DIAGNOSIS — M170 Bilateral primary osteoarthritis of knee: Secondary | ICD-10-CM | POA: Diagnosis not present

## 2016-10-07 DIAGNOSIS — G894 Chronic pain syndrome: Secondary | ICD-10-CM | POA: Diagnosis not present

## 2016-10-07 DIAGNOSIS — G629 Polyneuropathy, unspecified: Secondary | ICD-10-CM | POA: Diagnosis not present

## 2016-10-07 DIAGNOSIS — Z79891 Long term (current) use of opiate analgesic: Secondary | ICD-10-CM | POA: Diagnosis not present

## 2016-10-07 DIAGNOSIS — L97222 Non-pressure chronic ulcer of left calf with fat layer exposed: Secondary | ICD-10-CM | POA: Diagnosis not present

## 2016-10-07 DIAGNOSIS — Z720 Tobacco use: Secondary | ICD-10-CM | POA: Diagnosis not present

## 2016-10-11 DIAGNOSIS — C9110 Chronic lymphocytic leukemia of B-cell type not having achieved remission: Secondary | ICD-10-CM | POA: Diagnosis not present

## 2016-10-11 DIAGNOSIS — D801 Nonfamilial hypogammaglobulinemia: Secondary | ICD-10-CM | POA: Diagnosis not present

## 2016-10-21 DIAGNOSIS — M159 Polyosteoarthritis, unspecified: Secondary | ICD-10-CM | POA: Diagnosis not present

## 2016-10-21 DIAGNOSIS — R0600 Dyspnea, unspecified: Secondary | ICD-10-CM | POA: Diagnosis not present

## 2016-10-21 DIAGNOSIS — I10 Essential (primary) hypertension: Secondary | ICD-10-CM | POA: Diagnosis not present

## 2016-10-21 DIAGNOSIS — L0390 Cellulitis, unspecified: Secondary | ICD-10-CM | POA: Diagnosis not present

## 2016-10-21 DIAGNOSIS — J309 Allergic rhinitis, unspecified: Secondary | ICD-10-CM | POA: Diagnosis not present

## 2016-10-21 DIAGNOSIS — E8881 Metabolic syndrome: Secondary | ICD-10-CM | POA: Diagnosis not present

## 2016-10-21 DIAGNOSIS — R600 Localized edema: Secondary | ICD-10-CM | POA: Diagnosis not present

## 2016-10-21 DIAGNOSIS — E781 Pure hyperglyceridemia: Secondary | ICD-10-CM | POA: Diagnosis not present

## 2016-10-21 DIAGNOSIS — K219 Gastro-esophageal reflux disease without esophagitis: Secondary | ICD-10-CM | POA: Diagnosis not present

## 2016-10-21 DIAGNOSIS — C9192 Lymphoid leukemia, unspecified, in relapse: Secondary | ICD-10-CM | POA: Diagnosis not present

## 2016-10-21 DIAGNOSIS — M25512 Pain in left shoulder: Secondary | ICD-10-CM | POA: Diagnosis not present

## 2016-10-21 DIAGNOSIS — M810 Age-related osteoporosis without current pathological fracture: Secondary | ICD-10-CM | POA: Diagnosis not present

## 2016-10-30 DIAGNOSIS — J309 Allergic rhinitis, unspecified: Secondary | ICD-10-CM | POA: Diagnosis not present

## 2016-10-30 DIAGNOSIS — E8881 Metabolic syndrome: Secondary | ICD-10-CM | POA: Diagnosis not present

## 2016-10-30 DIAGNOSIS — I10 Essential (primary) hypertension: Secondary | ICD-10-CM | POA: Diagnosis not present

## 2016-10-30 DIAGNOSIS — E781 Pure hyperglyceridemia: Secondary | ICD-10-CM | POA: Diagnosis not present

## 2016-10-30 DIAGNOSIS — Z720 Tobacco use: Secondary | ICD-10-CM | POA: Diagnosis not present

## 2016-10-30 DIAGNOSIS — C9192 Lymphoid leukemia, unspecified, in relapse: Secondary | ICD-10-CM | POA: Diagnosis not present

## 2016-10-30 DIAGNOSIS — L0390 Cellulitis, unspecified: Secondary | ICD-10-CM | POA: Diagnosis not present

## 2016-10-30 DIAGNOSIS — M810 Age-related osteoporosis without current pathological fracture: Secondary | ICD-10-CM | POA: Diagnosis not present

## 2016-10-30 DIAGNOSIS — M159 Polyosteoarthritis, unspecified: Secondary | ICD-10-CM | POA: Diagnosis not present

## 2016-10-30 DIAGNOSIS — R600 Localized edema: Secondary | ICD-10-CM | POA: Diagnosis not present

## 2016-10-30 DIAGNOSIS — K219 Gastro-esophageal reflux disease without esophagitis: Secondary | ICD-10-CM | POA: Diagnosis not present

## 2016-10-30 DIAGNOSIS — R0600 Dyspnea, unspecified: Secondary | ICD-10-CM | POA: Diagnosis not present

## 2016-11-04 DIAGNOSIS — L97222 Non-pressure chronic ulcer of left calf with fat layer exposed: Secondary | ICD-10-CM | POA: Diagnosis not present

## 2016-11-04 DIAGNOSIS — G629 Polyneuropathy, unspecified: Secondary | ICD-10-CM | POA: Diagnosis not present

## 2016-11-04 DIAGNOSIS — Z720 Tobacco use: Secondary | ICD-10-CM | POA: Diagnosis not present

## 2016-11-04 DIAGNOSIS — M170 Bilateral primary osteoarthritis of knee: Secondary | ICD-10-CM | POA: Diagnosis not present

## 2016-11-04 DIAGNOSIS — M25512 Pain in left shoulder: Secondary | ICD-10-CM | POA: Diagnosis not present

## 2016-11-04 DIAGNOSIS — G894 Chronic pain syndrome: Secondary | ICD-10-CM | POA: Diagnosis not present

## 2016-11-04 DIAGNOSIS — Z79891 Long term (current) use of opiate analgesic: Secondary | ICD-10-CM | POA: Diagnosis not present

## 2016-11-08 DIAGNOSIS — C9110 Chronic lymphocytic leukemia of B-cell type not having achieved remission: Secondary | ICD-10-CM | POA: Diagnosis not present

## 2016-11-08 DIAGNOSIS — D819 Combined immunodeficiency, unspecified: Secondary | ICD-10-CM | POA: Diagnosis not present

## 2016-11-08 DIAGNOSIS — R946 Abnormal results of thyroid function studies: Secondary | ICD-10-CM | POA: Diagnosis not present

## 2016-11-11 DIAGNOSIS — J309 Allergic rhinitis, unspecified: Secondary | ICD-10-CM | POA: Diagnosis not present

## 2016-11-11 DIAGNOSIS — R252 Cramp and spasm: Secondary | ICD-10-CM | POA: Diagnosis not present

## 2016-11-11 DIAGNOSIS — R600 Localized edema: Secondary | ICD-10-CM | POA: Diagnosis not present

## 2016-11-11 DIAGNOSIS — R0600 Dyspnea, unspecified: Secondary | ICD-10-CM | POA: Diagnosis not present

## 2016-11-11 DIAGNOSIS — M159 Polyosteoarthritis, unspecified: Secondary | ICD-10-CM | POA: Diagnosis not present

## 2016-11-11 DIAGNOSIS — M810 Age-related osteoporosis without current pathological fracture: Secondary | ICD-10-CM | POA: Diagnosis not present

## 2016-11-11 DIAGNOSIS — I10 Essential (primary) hypertension: Secondary | ICD-10-CM | POA: Diagnosis not present

## 2016-11-11 DIAGNOSIS — Z720 Tobacco use: Secondary | ICD-10-CM | POA: Diagnosis not present

## 2016-11-11 DIAGNOSIS — C9192 Lymphoid leukemia, unspecified, in relapse: Secondary | ICD-10-CM | POA: Diagnosis not present

## 2016-11-11 DIAGNOSIS — K219 Gastro-esophageal reflux disease without esophagitis: Secondary | ICD-10-CM | POA: Diagnosis not present

## 2016-11-11 DIAGNOSIS — E8881 Metabolic syndrome: Secondary | ICD-10-CM | POA: Diagnosis not present

## 2016-11-11 DIAGNOSIS — E781 Pure hyperglyceridemia: Secondary | ICD-10-CM | POA: Diagnosis not present

## 2016-11-20 DIAGNOSIS — R252 Cramp and spasm: Secondary | ICD-10-CM | POA: Diagnosis not present

## 2016-11-20 DIAGNOSIS — M159 Polyosteoarthritis, unspecified: Secondary | ICD-10-CM | POA: Diagnosis not present

## 2016-11-20 DIAGNOSIS — R0600 Dyspnea, unspecified: Secondary | ICD-10-CM | POA: Diagnosis not present

## 2016-11-20 DIAGNOSIS — R600 Localized edema: Secondary | ICD-10-CM | POA: Diagnosis not present

## 2016-11-20 DIAGNOSIS — E781 Pure hyperglyceridemia: Secondary | ICD-10-CM | POA: Diagnosis not present

## 2016-11-20 DIAGNOSIS — M810 Age-related osteoporosis without current pathological fracture: Secondary | ICD-10-CM | POA: Diagnosis not present

## 2016-11-20 DIAGNOSIS — E8881 Metabolic syndrome: Secondary | ICD-10-CM | POA: Diagnosis not present

## 2016-11-20 DIAGNOSIS — K219 Gastro-esophageal reflux disease without esophagitis: Secondary | ICD-10-CM | POA: Diagnosis not present

## 2016-11-20 DIAGNOSIS — Z6841 Body Mass Index (BMI) 40.0 and over, adult: Secondary | ICD-10-CM | POA: Diagnosis not present

## 2016-11-20 DIAGNOSIS — J309 Allergic rhinitis, unspecified: Secondary | ICD-10-CM | POA: Diagnosis not present

## 2016-11-20 DIAGNOSIS — C9192 Lymphoid leukemia, unspecified, in relapse: Secondary | ICD-10-CM | POA: Diagnosis not present

## 2016-11-20 DIAGNOSIS — I10 Essential (primary) hypertension: Secondary | ICD-10-CM | POA: Diagnosis not present

## 2016-11-27 DIAGNOSIS — M810 Age-related osteoporosis without current pathological fracture: Secondary | ICD-10-CM | POA: Diagnosis not present

## 2016-11-27 DIAGNOSIS — E781 Pure hyperglyceridemia: Secondary | ICD-10-CM | POA: Diagnosis not present

## 2016-11-27 DIAGNOSIS — R0600 Dyspnea, unspecified: Secondary | ICD-10-CM | POA: Diagnosis not present

## 2016-11-27 DIAGNOSIS — M159 Polyosteoarthritis, unspecified: Secondary | ICD-10-CM | POA: Diagnosis not present

## 2016-11-27 DIAGNOSIS — J208 Acute bronchitis due to other specified organisms: Secondary | ICD-10-CM | POA: Diagnosis not present

## 2016-11-27 DIAGNOSIS — K219 Gastro-esophageal reflux disease without esophagitis: Secondary | ICD-10-CM | POA: Diagnosis not present

## 2016-11-27 DIAGNOSIS — E8881 Metabolic syndrome: Secondary | ICD-10-CM | POA: Diagnosis not present

## 2016-11-27 DIAGNOSIS — R600 Localized edema: Secondary | ICD-10-CM | POA: Diagnosis not present

## 2016-11-27 DIAGNOSIS — R05 Cough: Secondary | ICD-10-CM | POA: Diagnosis not present

## 2016-11-27 DIAGNOSIS — I10 Essential (primary) hypertension: Secondary | ICD-10-CM | POA: Diagnosis not present

## 2016-11-27 DIAGNOSIS — R252 Cramp and spasm: Secondary | ICD-10-CM | POA: Diagnosis not present

## 2016-11-27 DIAGNOSIS — J309 Allergic rhinitis, unspecified: Secondary | ICD-10-CM | POA: Diagnosis not present

## 2016-11-27 DIAGNOSIS — C9192 Lymphoid leukemia, unspecified, in relapse: Secondary | ICD-10-CM | POA: Diagnosis not present

## 2016-12-03 DIAGNOSIS — L97222 Non-pressure chronic ulcer of left calf with fat layer exposed: Secondary | ICD-10-CM | POA: Diagnosis not present

## 2016-12-03 DIAGNOSIS — I700 Atherosclerosis of aorta: Secondary | ICD-10-CM | POA: Diagnosis not present

## 2016-12-03 DIAGNOSIS — M1990 Unspecified osteoarthritis, unspecified site: Secondary | ICD-10-CM | POA: Diagnosis present

## 2016-12-03 DIAGNOSIS — A419 Sepsis, unspecified organism: Secondary | ICD-10-CM | POA: Diagnosis not present

## 2016-12-03 DIAGNOSIS — Z882 Allergy status to sulfonamides status: Secondary | ICD-10-CM | POA: Diagnosis not present

## 2016-12-03 DIAGNOSIS — K219 Gastro-esophageal reflux disease without esophagitis: Secondary | ICD-10-CM | POA: Diagnosis present

## 2016-12-03 DIAGNOSIS — C9110 Chronic lymphocytic leukemia of B-cell type not having achieved remission: Secondary | ICD-10-CM | POA: Diagnosis not present

## 2016-12-03 DIAGNOSIS — D61818 Other pancytopenia: Secondary | ICD-10-CM | POA: Diagnosis not present

## 2016-12-03 DIAGNOSIS — Z720 Tobacco use: Secondary | ICD-10-CM | POA: Diagnosis not present

## 2016-12-03 DIAGNOSIS — J449 Chronic obstructive pulmonary disease, unspecified: Secondary | ICD-10-CM | POA: Diagnosis not present

## 2016-12-03 DIAGNOSIS — D708 Other neutropenia: Secondary | ICD-10-CM | POA: Diagnosis not present

## 2016-12-03 DIAGNOSIS — R509 Fever, unspecified: Secondary | ICD-10-CM | POA: Diagnosis not present

## 2016-12-03 DIAGNOSIS — G629 Polyneuropathy, unspecified: Secondary | ICD-10-CM | POA: Diagnosis not present

## 2016-12-03 DIAGNOSIS — Z888 Allergy status to other drugs, medicaments and biological substances status: Secondary | ICD-10-CM | POA: Diagnosis not present

## 2016-12-03 DIAGNOSIS — G894 Chronic pain syndrome: Secondary | ICD-10-CM | POA: Diagnosis not present

## 2016-12-03 DIAGNOSIS — D649 Anemia, unspecified: Secondary | ICD-10-CM | POA: Diagnosis not present

## 2016-12-03 DIAGNOSIS — N390 Urinary tract infection, site not specified: Secondary | ICD-10-CM | POA: Diagnosis not present

## 2016-12-03 DIAGNOSIS — F17210 Nicotine dependence, cigarettes, uncomplicated: Secondary | ICD-10-CM | POA: Diagnosis present

## 2016-12-03 DIAGNOSIS — Z79899 Other long term (current) drug therapy: Secondary | ICD-10-CM | POA: Diagnosis not present

## 2016-12-03 DIAGNOSIS — D696 Thrombocytopenia, unspecified: Secondary | ICD-10-CM | POA: Diagnosis not present

## 2016-12-03 DIAGNOSIS — R5081 Fever presenting with conditions classified elsewhere: Secondary | ICD-10-CM | POA: Diagnosis present

## 2016-12-03 DIAGNOSIS — R0600 Dyspnea, unspecified: Secondary | ICD-10-CM | POA: Diagnosis not present

## 2016-12-03 DIAGNOSIS — Z79891 Long term (current) use of opiate analgesic: Secondary | ICD-10-CM | POA: Diagnosis not present

## 2016-12-03 DIAGNOSIS — R0602 Shortness of breath: Secondary | ICD-10-CM | POA: Diagnosis not present

## 2016-12-03 DIAGNOSIS — G2581 Restless legs syndrome: Secondary | ICD-10-CM | POA: Diagnosis present

## 2016-12-03 DIAGNOSIS — D819 Combined immunodeficiency, unspecified: Secondary | ICD-10-CM | POA: Diagnosis not present

## 2016-12-03 DIAGNOSIS — I5030 Unspecified diastolic (congestive) heart failure: Secondary | ICD-10-CM | POA: Diagnosis not present

## 2016-12-03 DIAGNOSIS — J069 Acute upper respiratory infection, unspecified: Secondary | ICD-10-CM | POA: Diagnosis not present

## 2016-12-03 DIAGNOSIS — M170 Bilateral primary osteoarthritis of knee: Secondary | ICD-10-CM | POA: Diagnosis not present

## 2016-12-04 DIAGNOSIS — R0602 Shortness of breath: Secondary | ICD-10-CM

## 2016-12-05 DIAGNOSIS — J449 Chronic obstructive pulmonary disease, unspecified: Secondary | ICD-10-CM

## 2016-12-05 DIAGNOSIS — C9110 Chronic lymphocytic leukemia of B-cell type not having achieved remission: Secondary | ICD-10-CM

## 2016-12-05 DIAGNOSIS — R0600 Dyspnea, unspecified: Secondary | ICD-10-CM

## 2016-12-05 DIAGNOSIS — D819 Combined immunodeficiency, unspecified: Secondary | ICD-10-CM

## 2016-12-05 DIAGNOSIS — J069 Acute upper respiratory infection, unspecified: Secondary | ICD-10-CM

## 2016-12-05 DIAGNOSIS — N390 Urinary tract infection, site not specified: Secondary | ICD-10-CM

## 2016-12-05 DIAGNOSIS — D61818 Other pancytopenia: Secondary | ICD-10-CM

## 2016-12-09 DIAGNOSIS — G629 Polyneuropathy, unspecified: Secondary | ICD-10-CM | POA: Diagnosis not present

## 2016-12-09 DIAGNOSIS — L97222 Non-pressure chronic ulcer of left calf with fat layer exposed: Secondary | ICD-10-CM | POA: Diagnosis not present

## 2016-12-09 DIAGNOSIS — Z720 Tobacco use: Secondary | ICD-10-CM | POA: Diagnosis not present

## 2016-12-09 DIAGNOSIS — Z79891 Long term (current) use of opiate analgesic: Secondary | ICD-10-CM | POA: Diagnosis not present

## 2016-12-09 DIAGNOSIS — M170 Bilateral primary osteoarthritis of knee: Secondary | ICD-10-CM | POA: Diagnosis not present

## 2016-12-09 DIAGNOSIS — G894 Chronic pain syndrome: Secondary | ICD-10-CM | POA: Diagnosis not present

## 2016-12-10 DIAGNOSIS — C9110 Chronic lymphocytic leukemia of B-cell type not having achieved remission: Secondary | ICD-10-CM | POA: Diagnosis not present

## 2016-12-10 DIAGNOSIS — R300 Dysuria: Secondary | ICD-10-CM | POA: Diagnosis not present

## 2016-12-10 DIAGNOSIS — D819 Combined immunodeficiency, unspecified: Secondary | ICD-10-CM | POA: Diagnosis not present

## 2016-12-13 DIAGNOSIS — R0600 Dyspnea, unspecified: Secondary | ICD-10-CM | POA: Diagnosis not present

## 2016-12-13 DIAGNOSIS — K219 Gastro-esophageal reflux disease without esophagitis: Secondary | ICD-10-CM | POA: Diagnosis not present

## 2016-12-13 DIAGNOSIS — J309 Allergic rhinitis, unspecified: Secondary | ICD-10-CM | POA: Diagnosis not present

## 2016-12-13 DIAGNOSIS — M159 Polyosteoarthritis, unspecified: Secondary | ICD-10-CM | POA: Diagnosis not present

## 2016-12-13 DIAGNOSIS — E8881 Metabolic syndrome: Secondary | ICD-10-CM | POA: Diagnosis not present

## 2016-12-13 DIAGNOSIS — M810 Age-related osteoporosis without current pathological fracture: Secondary | ICD-10-CM | POA: Diagnosis not present

## 2016-12-13 DIAGNOSIS — R252 Cramp and spasm: Secondary | ICD-10-CM | POA: Diagnosis not present

## 2016-12-13 DIAGNOSIS — E781 Pure hyperglyceridemia: Secondary | ICD-10-CM | POA: Diagnosis not present

## 2016-12-13 DIAGNOSIS — C9192 Lymphoid leukemia, unspecified, in relapse: Secondary | ICD-10-CM | POA: Diagnosis not present

## 2016-12-13 DIAGNOSIS — R600 Localized edema: Secondary | ICD-10-CM | POA: Diagnosis not present

## 2016-12-13 DIAGNOSIS — D709 Neutropenia, unspecified: Secondary | ICD-10-CM | POA: Diagnosis not present

## 2016-12-13 DIAGNOSIS — I10 Essential (primary) hypertension: Secondary | ICD-10-CM | POA: Diagnosis not present

## 2016-12-16 DIAGNOSIS — M25512 Pain in left shoulder: Secondary | ICD-10-CM | POA: Diagnosis not present

## 2016-12-16 DIAGNOSIS — M19012 Primary osteoarthritis, left shoulder: Secondary | ICD-10-CM | POA: Diagnosis not present

## 2016-12-18 DIAGNOSIS — K219 Gastro-esophageal reflux disease without esophagitis: Secondary | ICD-10-CM | POA: Diagnosis not present

## 2016-12-18 DIAGNOSIS — D709 Neutropenia, unspecified: Secondary | ICD-10-CM | POA: Diagnosis not present

## 2016-12-18 DIAGNOSIS — L03311 Cellulitis of abdominal wall: Secondary | ICD-10-CM | POA: Diagnosis not present

## 2016-12-18 DIAGNOSIS — N390 Urinary tract infection, site not specified: Secondary | ICD-10-CM | POA: Diagnosis not present

## 2016-12-18 DIAGNOSIS — D61818 Other pancytopenia: Secondary | ICD-10-CM | POA: Diagnosis not present

## 2016-12-18 DIAGNOSIS — L03315 Cellulitis of perineum: Secondary | ICD-10-CM | POA: Diagnosis not present

## 2016-12-18 DIAGNOSIS — N764 Abscess of vulva: Secondary | ICD-10-CM | POA: Diagnosis not present

## 2016-12-18 DIAGNOSIS — F17200 Nicotine dependence, unspecified, uncomplicated: Secondary | ICD-10-CM | POA: Diagnosis present

## 2016-12-18 DIAGNOSIS — K5790 Diverticulosis of intestine, part unspecified, without perforation or abscess without bleeding: Secondary | ICD-10-CM | POA: Diagnosis not present

## 2016-12-18 DIAGNOSIS — G2581 Restless legs syndrome: Secondary | ICD-10-CM | POA: Diagnosis not present

## 2016-12-18 DIAGNOSIS — D819 Combined immunodeficiency, unspecified: Secondary | ICD-10-CM | POA: Diagnosis not present

## 2016-12-18 DIAGNOSIS — I10 Essential (primary) hypertension: Secondary | ICD-10-CM | POA: Diagnosis present

## 2016-12-18 DIAGNOSIS — D649 Anemia, unspecified: Secondary | ICD-10-CM | POA: Diagnosis not present

## 2016-12-18 DIAGNOSIS — D696 Thrombocytopenia, unspecified: Secondary | ICD-10-CM | POA: Diagnosis not present

## 2016-12-18 DIAGNOSIS — Z856 Personal history of leukemia: Secondary | ICD-10-CM | POA: Diagnosis not present

## 2016-12-18 DIAGNOSIS — D735 Infarction of spleen: Secondary | ICD-10-CM | POA: Diagnosis not present

## 2016-12-18 DIAGNOSIS — L03818 Cellulitis of other sites: Secondary | ICD-10-CM | POA: Diagnosis not present

## 2016-12-18 DIAGNOSIS — C9110 Chronic lymphocytic leukemia of B-cell type not having achieved remission: Secondary | ICD-10-CM | POA: Diagnosis not present

## 2016-12-25 DIAGNOSIS — D61818 Other pancytopenia: Secondary | ICD-10-CM

## 2016-12-25 DIAGNOSIS — D819 Combined immunodeficiency, unspecified: Secondary | ICD-10-CM

## 2016-12-25 DIAGNOSIS — L03818 Cellulitis of other sites: Secondary | ICD-10-CM

## 2016-12-25 DIAGNOSIS — C9110 Chronic lymphocytic leukemia of B-cell type not having achieved remission: Secondary | ICD-10-CM

## 2016-12-26 DIAGNOSIS — C9110 Chronic lymphocytic leukemia of B-cell type not having achieved remission: Secondary | ICD-10-CM

## 2016-12-26 DIAGNOSIS — D61818 Other pancytopenia: Secondary | ICD-10-CM

## 2016-12-26 DIAGNOSIS — D709 Neutropenia, unspecified: Secondary | ICD-10-CM

## 2016-12-27 DIAGNOSIS — D696 Thrombocytopenia, unspecified: Secondary | ICD-10-CM | POA: Diagnosis not present

## 2016-12-27 DIAGNOSIS — Z79891 Long term (current) use of opiate analgesic: Secondary | ICD-10-CM | POA: Diagnosis not present

## 2016-12-27 DIAGNOSIS — L03314 Cellulitis of groin: Secondary | ICD-10-CM | POA: Diagnosis not present

## 2016-12-27 DIAGNOSIS — K219 Gastro-esophageal reflux disease without esophagitis: Secondary | ICD-10-CM | POA: Diagnosis not present

## 2016-12-27 DIAGNOSIS — F17210 Nicotine dependence, cigarettes, uncomplicated: Secondary | ICD-10-CM | POA: Diagnosis not present

## 2016-12-27 DIAGNOSIS — N3001 Acute cystitis with hematuria: Secondary | ICD-10-CM | POA: Diagnosis not present

## 2016-12-27 DIAGNOSIS — C9110 Chronic lymphocytic leukemia of B-cell type not having achieved remission: Secondary | ICD-10-CM | POA: Diagnosis not present

## 2016-12-30 DIAGNOSIS — S31104A Unspecified open wound of abdominal wall, left lower quadrant without penetration into peritoneal cavity, initial encounter: Secondary | ICD-10-CM | POA: Diagnosis not present

## 2016-12-30 DIAGNOSIS — M1990 Unspecified osteoarthritis, unspecified site: Secondary | ICD-10-CM | POA: Diagnosis not present

## 2016-12-30 DIAGNOSIS — K529 Noninfective gastroenteritis and colitis, unspecified: Secondary | ICD-10-CM | POA: Diagnosis not present

## 2016-12-30 DIAGNOSIS — C9110 Chronic lymphocytic leukemia of B-cell type not having achieved remission: Secondary | ICD-10-CM | POA: Diagnosis not present

## 2016-12-30 DIAGNOSIS — F17200 Nicotine dependence, unspecified, uncomplicated: Secondary | ICD-10-CM | POA: Diagnosis not present

## 2016-12-30 DIAGNOSIS — S71112A Laceration without foreign body, left thigh, initial encounter: Secondary | ICD-10-CM | POA: Diagnosis not present

## 2016-12-30 DIAGNOSIS — S31103A Unspecified open wound of abdominal wall, right lower quadrant without penetration into peritoneal cavity, initial encounter: Secondary | ICD-10-CM | POA: Diagnosis not present

## 2016-12-30 DIAGNOSIS — D649 Anemia, unspecified: Secondary | ICD-10-CM | POA: Diagnosis not present

## 2016-12-31 DIAGNOSIS — C9110 Chronic lymphocytic leukemia of B-cell type not having achieved remission: Secondary | ICD-10-CM | POA: Diagnosis not present

## 2017-01-01 DIAGNOSIS — S31103A Unspecified open wound of abdominal wall, right lower quadrant without penetration into peritoneal cavity, initial encounter: Secondary | ICD-10-CM | POA: Diagnosis not present

## 2017-01-01 DIAGNOSIS — S31104A Unspecified open wound of abdominal wall, left lower quadrant without penetration into peritoneal cavity, initial encounter: Secondary | ICD-10-CM | POA: Diagnosis not present

## 2017-01-02 DIAGNOSIS — K219 Gastro-esophageal reflux disease without esophagitis: Secondary | ICD-10-CM | POA: Diagnosis not present

## 2017-01-02 DIAGNOSIS — J309 Allergic rhinitis, unspecified: Secondary | ICD-10-CM | POA: Diagnosis not present

## 2017-01-02 DIAGNOSIS — C9192 Lymphoid leukemia, unspecified, in relapse: Secondary | ICD-10-CM | POA: Diagnosis not present

## 2017-01-02 DIAGNOSIS — T148XXA Other injury of unspecified body region, initial encounter: Secondary | ICD-10-CM | POA: Diagnosis not present

## 2017-01-02 DIAGNOSIS — M810 Age-related osteoporosis without current pathological fracture: Secondary | ICD-10-CM | POA: Diagnosis not present

## 2017-01-02 DIAGNOSIS — R0600 Dyspnea, unspecified: Secondary | ICD-10-CM | POA: Diagnosis not present

## 2017-01-02 DIAGNOSIS — M159 Polyosteoarthritis, unspecified: Secondary | ICD-10-CM | POA: Diagnosis not present

## 2017-01-02 DIAGNOSIS — R600 Localized edema: Secondary | ICD-10-CM | POA: Diagnosis not present

## 2017-01-02 DIAGNOSIS — I10 Essential (primary) hypertension: Secondary | ICD-10-CM | POA: Diagnosis not present

## 2017-01-02 DIAGNOSIS — Z720 Tobacco use: Secondary | ICD-10-CM | POA: Diagnosis not present

## 2017-01-02 DIAGNOSIS — E781 Pure hyperglyceridemia: Secondary | ICD-10-CM | POA: Diagnosis not present

## 2017-01-02 DIAGNOSIS — R252 Cramp and spasm: Secondary | ICD-10-CM | POA: Diagnosis not present

## 2017-01-03 DIAGNOSIS — D696 Thrombocytopenia, unspecified: Secondary | ICD-10-CM | POA: Diagnosis not present

## 2017-01-03 DIAGNOSIS — C9110 Chronic lymphocytic leukemia of B-cell type not having achieved remission: Secondary | ICD-10-CM | POA: Diagnosis not present

## 2017-01-03 DIAGNOSIS — Z79891 Long term (current) use of opiate analgesic: Secondary | ICD-10-CM | POA: Diagnosis not present

## 2017-01-03 DIAGNOSIS — L03314 Cellulitis of groin: Secondary | ICD-10-CM | POA: Diagnosis not present

## 2017-01-03 DIAGNOSIS — F17210 Nicotine dependence, cigarettes, uncomplicated: Secondary | ICD-10-CM | POA: Diagnosis not present

## 2017-01-03 DIAGNOSIS — K219 Gastro-esophageal reflux disease without esophagitis: Secondary | ICD-10-CM | POA: Diagnosis not present

## 2017-01-06 DIAGNOSIS — F17210 Nicotine dependence, cigarettes, uncomplicated: Secondary | ICD-10-CM | POA: Diagnosis not present

## 2017-01-06 DIAGNOSIS — C9110 Chronic lymphocytic leukemia of B-cell type not having achieved remission: Secondary | ICD-10-CM | POA: Diagnosis not present

## 2017-01-06 DIAGNOSIS — L03314 Cellulitis of groin: Secondary | ICD-10-CM | POA: Diagnosis not present

## 2017-01-06 DIAGNOSIS — Z79891 Long term (current) use of opiate analgesic: Secondary | ICD-10-CM | POA: Diagnosis not present

## 2017-01-06 DIAGNOSIS — M170 Bilateral primary osteoarthritis of knee: Secondary | ICD-10-CM | POA: Diagnosis not present

## 2017-01-06 DIAGNOSIS — G894 Chronic pain syndrome: Secondary | ICD-10-CM | POA: Diagnosis not present

## 2017-01-06 DIAGNOSIS — L97222 Non-pressure chronic ulcer of left calf with fat layer exposed: Secondary | ICD-10-CM | POA: Diagnosis not present

## 2017-01-06 DIAGNOSIS — K219 Gastro-esophageal reflux disease without esophagitis: Secondary | ICD-10-CM | POA: Diagnosis not present

## 2017-01-06 DIAGNOSIS — G629 Polyneuropathy, unspecified: Secondary | ICD-10-CM | POA: Diagnosis not present

## 2017-01-06 DIAGNOSIS — D696 Thrombocytopenia, unspecified: Secondary | ICD-10-CM | POA: Diagnosis not present

## 2017-01-06 DIAGNOSIS — Z720 Tobacco use: Secondary | ICD-10-CM | POA: Diagnosis not present

## 2017-01-08 DIAGNOSIS — S31104A Unspecified open wound of abdominal wall, left lower quadrant without penetration into peritoneal cavity, initial encounter: Secondary | ICD-10-CM | POA: Diagnosis not present

## 2017-01-08 DIAGNOSIS — S31103A Unspecified open wound of abdominal wall, right lower quadrant without penetration into peritoneal cavity, initial encounter: Secondary | ICD-10-CM | POA: Diagnosis not present

## 2017-01-08 DIAGNOSIS — L98492 Non-pressure chronic ulcer of skin of other sites with fat layer exposed: Secondary | ICD-10-CM | POA: Diagnosis not present

## 2017-01-09 DIAGNOSIS — C9110 Chronic lymphocytic leukemia of B-cell type not having achieved remission: Secondary | ICD-10-CM | POA: Diagnosis not present

## 2017-01-09 DIAGNOSIS — Z23 Encounter for immunization: Secondary | ICD-10-CM | POA: Diagnosis not present

## 2017-01-10 DIAGNOSIS — D696 Thrombocytopenia, unspecified: Secondary | ICD-10-CM | POA: Diagnosis not present

## 2017-01-10 DIAGNOSIS — L03314 Cellulitis of groin: Secondary | ICD-10-CM | POA: Diagnosis not present

## 2017-01-10 DIAGNOSIS — Z79891 Long term (current) use of opiate analgesic: Secondary | ICD-10-CM | POA: Diagnosis not present

## 2017-01-10 DIAGNOSIS — F17210 Nicotine dependence, cigarettes, uncomplicated: Secondary | ICD-10-CM | POA: Diagnosis not present

## 2017-01-10 DIAGNOSIS — C9110 Chronic lymphocytic leukemia of B-cell type not having achieved remission: Secondary | ICD-10-CM | POA: Diagnosis not present

## 2017-01-10 DIAGNOSIS — K219 Gastro-esophageal reflux disease without esophagitis: Secondary | ICD-10-CM | POA: Diagnosis not present

## 2017-01-13 DIAGNOSIS — F17210 Nicotine dependence, cigarettes, uncomplicated: Secondary | ICD-10-CM | POA: Diagnosis not present

## 2017-01-13 DIAGNOSIS — K219 Gastro-esophageal reflux disease without esophagitis: Secondary | ICD-10-CM | POA: Diagnosis not present

## 2017-01-13 DIAGNOSIS — L03314 Cellulitis of groin: Secondary | ICD-10-CM | POA: Diagnosis not present

## 2017-01-13 DIAGNOSIS — D696 Thrombocytopenia, unspecified: Secondary | ICD-10-CM | POA: Diagnosis not present

## 2017-01-13 DIAGNOSIS — C9110 Chronic lymphocytic leukemia of B-cell type not having achieved remission: Secondary | ICD-10-CM | POA: Diagnosis not present

## 2017-01-13 DIAGNOSIS — Z79891 Long term (current) use of opiate analgesic: Secondary | ICD-10-CM | POA: Diagnosis not present

## 2017-01-15 DIAGNOSIS — L97122 Non-pressure chronic ulcer of left thigh with fat layer exposed: Secondary | ICD-10-CM | POA: Diagnosis not present

## 2017-01-15 DIAGNOSIS — S31103A Unspecified open wound of abdominal wall, right lower quadrant without penetration into peritoneal cavity, initial encounter: Secondary | ICD-10-CM | POA: Diagnosis not present

## 2017-01-15 DIAGNOSIS — S31104A Unspecified open wound of abdominal wall, left lower quadrant without penetration into peritoneal cavity, initial encounter: Secondary | ICD-10-CM | POA: Diagnosis not present

## 2017-01-15 DIAGNOSIS — L97112 Non-pressure chronic ulcer of right thigh with fat layer exposed: Secondary | ICD-10-CM | POA: Diagnosis not present

## 2017-01-17 DIAGNOSIS — Z79891 Long term (current) use of opiate analgesic: Secondary | ICD-10-CM | POA: Diagnosis not present

## 2017-01-17 DIAGNOSIS — C9110 Chronic lymphocytic leukemia of B-cell type not having achieved remission: Secondary | ICD-10-CM | POA: Diagnosis not present

## 2017-01-17 DIAGNOSIS — L03314 Cellulitis of groin: Secondary | ICD-10-CM | POA: Diagnosis not present

## 2017-01-17 DIAGNOSIS — K219 Gastro-esophageal reflux disease without esophagitis: Secondary | ICD-10-CM | POA: Diagnosis not present

## 2017-01-17 DIAGNOSIS — D696 Thrombocytopenia, unspecified: Secondary | ICD-10-CM | POA: Diagnosis not present

## 2017-01-17 DIAGNOSIS — F17210 Nicotine dependence, cigarettes, uncomplicated: Secondary | ICD-10-CM | POA: Diagnosis not present

## 2017-01-20 DIAGNOSIS — L03314 Cellulitis of groin: Secondary | ICD-10-CM | POA: Diagnosis not present

## 2017-01-20 DIAGNOSIS — C9110 Chronic lymphocytic leukemia of B-cell type not having achieved remission: Secondary | ICD-10-CM | POA: Diagnosis not present

## 2017-01-20 DIAGNOSIS — K219 Gastro-esophageal reflux disease without esophagitis: Secondary | ICD-10-CM | POA: Diagnosis not present

## 2017-01-20 DIAGNOSIS — D696 Thrombocytopenia, unspecified: Secondary | ICD-10-CM | POA: Diagnosis not present

## 2017-01-20 DIAGNOSIS — Z79891 Long term (current) use of opiate analgesic: Secondary | ICD-10-CM | POA: Diagnosis not present

## 2017-01-20 DIAGNOSIS — F17210 Nicotine dependence, cigarettes, uncomplicated: Secondary | ICD-10-CM | POA: Diagnosis not present

## 2017-01-22 DIAGNOSIS — S71111A Laceration without foreign body, right thigh, initial encounter: Secondary | ICD-10-CM | POA: Diagnosis not present

## 2017-01-23 DIAGNOSIS — K219 Gastro-esophageal reflux disease without esophagitis: Secondary | ICD-10-CM | POA: Diagnosis not present

## 2017-01-23 DIAGNOSIS — Z79891 Long term (current) use of opiate analgesic: Secondary | ICD-10-CM | POA: Diagnosis not present

## 2017-01-23 DIAGNOSIS — D72819 Decreased white blood cell count, unspecified: Secondary | ICD-10-CM | POA: Diagnosis not present

## 2017-01-23 DIAGNOSIS — E860 Dehydration: Secondary | ICD-10-CM | POA: Diagnosis present

## 2017-01-23 DIAGNOSIS — E559 Vitamin D deficiency, unspecified: Secondary | ICD-10-CM | POA: Diagnosis not present

## 2017-01-23 DIAGNOSIS — N189 Chronic kidney disease, unspecified: Secondary | ICD-10-CM | POA: Diagnosis not present

## 2017-01-23 DIAGNOSIS — Z0001 Encounter for general adult medical examination with abnormal findings: Secondary | ICD-10-CM | POA: Diagnosis not present

## 2017-01-23 DIAGNOSIS — D708 Other neutropenia: Secondary | ICD-10-CM | POA: Diagnosis present

## 2017-01-23 DIAGNOSIS — D630 Anemia in neoplastic disease: Secondary | ICD-10-CM | POA: Diagnosis present

## 2017-01-23 DIAGNOSIS — Z882 Allergy status to sulfonamides status: Secondary | ICD-10-CM | POA: Diagnosis not present

## 2017-01-23 DIAGNOSIS — Z888 Allergy status to other drugs, medicaments and biological substances status: Secondary | ICD-10-CM | POA: Diagnosis not present

## 2017-01-23 DIAGNOSIS — D649 Anemia, unspecified: Secondary | ICD-10-CM | POA: Diagnosis not present

## 2017-01-23 DIAGNOSIS — D696 Thrombocytopenia, unspecified: Secondary | ICD-10-CM | POA: Diagnosis not present

## 2017-01-23 DIAGNOSIS — F17210 Nicotine dependence, cigarettes, uncomplicated: Secondary | ICD-10-CM | POA: Diagnosis present

## 2017-01-23 DIAGNOSIS — M1990 Unspecified osteoarthritis, unspecified site: Secondary | ICD-10-CM | POA: Diagnosis present

## 2017-01-23 DIAGNOSIS — Z79899 Other long term (current) drug therapy: Secondary | ICD-10-CM | POA: Diagnosis not present

## 2017-01-23 DIAGNOSIS — R945 Abnormal results of liver function studies: Secondary | ICD-10-CM | POA: Diagnosis not present

## 2017-01-23 DIAGNOSIS — J9811 Atelectasis: Secondary | ICD-10-CM | POA: Diagnosis not present

## 2017-01-23 DIAGNOSIS — G2581 Restless legs syndrome: Secondary | ICD-10-CM | POA: Diagnosis present

## 2017-01-23 DIAGNOSIS — D61818 Other pancytopenia: Secondary | ICD-10-CM | POA: Diagnosis not present

## 2017-01-23 DIAGNOSIS — N179 Acute kidney failure, unspecified: Secondary | ICD-10-CM | POA: Diagnosis present

## 2017-01-23 DIAGNOSIS — C9110 Chronic lymphocytic leukemia of B-cell type not having achieved remission: Secondary | ICD-10-CM | POA: Diagnosis not present

## 2017-01-24 DIAGNOSIS — R945 Abnormal results of liver function studies: Secondary | ICD-10-CM

## 2017-01-24 DIAGNOSIS — N189 Chronic kidney disease, unspecified: Secondary | ICD-10-CM

## 2017-01-24 DIAGNOSIS — C9110 Chronic lymphocytic leukemia of B-cell type not having achieved remission: Secondary | ICD-10-CM

## 2017-01-24 DIAGNOSIS — D61818 Other pancytopenia: Secondary | ICD-10-CM

## 2017-01-24 DIAGNOSIS — E559 Vitamin D deficiency, unspecified: Secondary | ICD-10-CM

## 2017-01-27 DIAGNOSIS — C9110 Chronic lymphocytic leukemia of B-cell type not having achieved remission: Secondary | ICD-10-CM | POA: Diagnosis not present

## 2017-01-27 DIAGNOSIS — K219 Gastro-esophageal reflux disease without esophagitis: Secondary | ICD-10-CM | POA: Diagnosis not present

## 2017-01-27 DIAGNOSIS — Z79891 Long term (current) use of opiate analgesic: Secondary | ICD-10-CM | POA: Diagnosis not present

## 2017-01-27 DIAGNOSIS — F17210 Nicotine dependence, cigarettes, uncomplicated: Secondary | ICD-10-CM | POA: Diagnosis not present

## 2017-01-27 DIAGNOSIS — D696 Thrombocytopenia, unspecified: Secondary | ICD-10-CM | POA: Diagnosis not present

## 2017-01-27 DIAGNOSIS — L03314 Cellulitis of groin: Secondary | ICD-10-CM | POA: Diagnosis not present

## 2017-01-28 DIAGNOSIS — L97122 Non-pressure chronic ulcer of left thigh with fat layer exposed: Secondary | ICD-10-CM | POA: Diagnosis not present

## 2017-01-28 DIAGNOSIS — L97112 Non-pressure chronic ulcer of right thigh with fat layer exposed: Secondary | ICD-10-CM | POA: Diagnosis not present

## 2017-01-28 DIAGNOSIS — S31103D Unspecified open wound of abdominal wall, right lower quadrant without penetration into peritoneal cavity, subsequent encounter: Secondary | ICD-10-CM | POA: Diagnosis not present

## 2017-01-28 DIAGNOSIS — S31104D Unspecified open wound of abdominal wall, left lower quadrant without penetration into peritoneal cavity, subsequent encounter: Secondary | ICD-10-CM | POA: Diagnosis not present

## 2017-01-29 DIAGNOSIS — Z79891 Long term (current) use of opiate analgesic: Secondary | ICD-10-CM | POA: Diagnosis not present

## 2017-01-29 DIAGNOSIS — D696 Thrombocytopenia, unspecified: Secondary | ICD-10-CM | POA: Diagnosis not present

## 2017-01-29 DIAGNOSIS — K219 Gastro-esophageal reflux disease without esophagitis: Secondary | ICD-10-CM | POA: Diagnosis not present

## 2017-01-29 DIAGNOSIS — C9110 Chronic lymphocytic leukemia of B-cell type not having achieved remission: Secondary | ICD-10-CM | POA: Diagnosis not present

## 2017-01-29 DIAGNOSIS — L03314 Cellulitis of groin: Secondary | ICD-10-CM | POA: Diagnosis not present

## 2017-01-29 DIAGNOSIS — F17210 Nicotine dependence, cigarettes, uncomplicated: Secondary | ICD-10-CM | POA: Diagnosis not present

## 2017-01-30 DIAGNOSIS — D735 Infarction of spleen: Secondary | ICD-10-CM | POA: Diagnosis not present

## 2017-01-30 DIAGNOSIS — D819 Combined immunodeficiency, unspecified: Secondary | ICD-10-CM | POA: Diagnosis not present

## 2017-01-30 DIAGNOSIS — E860 Dehydration: Secondary | ICD-10-CM | POA: Diagnosis not present

## 2017-01-30 DIAGNOSIS — D61818 Other pancytopenia: Secondary | ICD-10-CM | POA: Diagnosis not present

## 2017-01-30 DIAGNOSIS — N189 Chronic kidney disease, unspecified: Secondary | ICD-10-CM | POA: Diagnosis not present

## 2017-01-30 DIAGNOSIS — L03311 Cellulitis of abdominal wall: Secondary | ICD-10-CM | POA: Diagnosis not present

## 2017-01-30 DIAGNOSIS — C9110 Chronic lymphocytic leukemia of B-cell type not having achieved remission: Secondary | ICD-10-CM | POA: Diagnosis not present

## 2017-01-31 DIAGNOSIS — F17210 Nicotine dependence, cigarettes, uncomplicated: Secondary | ICD-10-CM | POA: Diagnosis not present

## 2017-01-31 DIAGNOSIS — D696 Thrombocytopenia, unspecified: Secondary | ICD-10-CM | POA: Diagnosis not present

## 2017-01-31 DIAGNOSIS — L03314 Cellulitis of groin: Secondary | ICD-10-CM | POA: Diagnosis not present

## 2017-01-31 DIAGNOSIS — K219 Gastro-esophageal reflux disease without esophagitis: Secondary | ICD-10-CM | POA: Diagnosis not present

## 2017-01-31 DIAGNOSIS — C9110 Chronic lymphocytic leukemia of B-cell type not having achieved remission: Secondary | ICD-10-CM | POA: Diagnosis not present

## 2017-01-31 DIAGNOSIS — Z79891 Long term (current) use of opiate analgesic: Secondary | ICD-10-CM | POA: Diagnosis not present

## 2017-02-03 DIAGNOSIS — F17210 Nicotine dependence, cigarettes, uncomplicated: Secondary | ICD-10-CM | POA: Diagnosis not present

## 2017-02-03 DIAGNOSIS — D696 Thrombocytopenia, unspecified: Secondary | ICD-10-CM | POA: Diagnosis not present

## 2017-02-03 DIAGNOSIS — C9110 Chronic lymphocytic leukemia of B-cell type not having achieved remission: Secondary | ICD-10-CM | POA: Diagnosis not present

## 2017-02-03 DIAGNOSIS — L03314 Cellulitis of groin: Secondary | ICD-10-CM | POA: Diagnosis not present

## 2017-02-03 DIAGNOSIS — K219 Gastro-esophageal reflux disease without esophagitis: Secondary | ICD-10-CM | POA: Diagnosis not present

## 2017-02-03 DIAGNOSIS — Z79891 Long term (current) use of opiate analgesic: Secondary | ICD-10-CM | POA: Diagnosis not present

## 2017-02-04 DIAGNOSIS — E781 Pure hyperglyceridemia: Secondary | ICD-10-CM | POA: Diagnosis not present

## 2017-02-04 DIAGNOSIS — I10 Essential (primary) hypertension: Secondary | ICD-10-CM | POA: Diagnosis not present

## 2017-02-04 DIAGNOSIS — S31104D Unspecified open wound of abdominal wall, left lower quadrant without penetration into peritoneal cavity, subsequent encounter: Secondary | ICD-10-CM | POA: Diagnosis not present

## 2017-02-04 DIAGNOSIS — Z720 Tobacco use: Secondary | ICD-10-CM | POA: Diagnosis not present

## 2017-02-04 DIAGNOSIS — L97122 Non-pressure chronic ulcer of left thigh with fat layer exposed: Secondary | ICD-10-CM | POA: Diagnosis not present

## 2017-02-04 DIAGNOSIS — R252 Cramp and spasm: Secondary | ICD-10-CM | POA: Diagnosis not present

## 2017-02-04 DIAGNOSIS — M810 Age-related osteoporosis without current pathological fracture: Secondary | ICD-10-CM | POA: Diagnosis not present

## 2017-02-04 DIAGNOSIS — R197 Diarrhea, unspecified: Secondary | ICD-10-CM | POA: Diagnosis not present

## 2017-02-04 DIAGNOSIS — K219 Gastro-esophageal reflux disease without esophagitis: Secondary | ICD-10-CM | POA: Diagnosis not present

## 2017-02-04 DIAGNOSIS — M159 Polyosteoarthritis, unspecified: Secondary | ICD-10-CM | POA: Diagnosis not present

## 2017-02-04 DIAGNOSIS — R0600 Dyspnea, unspecified: Secondary | ICD-10-CM | POA: Diagnosis not present

## 2017-02-04 DIAGNOSIS — R600 Localized edema: Secondary | ICD-10-CM | POA: Diagnosis not present

## 2017-02-04 DIAGNOSIS — J309 Allergic rhinitis, unspecified: Secondary | ICD-10-CM | POA: Diagnosis not present

## 2017-02-05 DIAGNOSIS — F17210 Nicotine dependence, cigarettes, uncomplicated: Secondary | ICD-10-CM | POA: Diagnosis not present

## 2017-02-05 DIAGNOSIS — Z79891 Long term (current) use of opiate analgesic: Secondary | ICD-10-CM | POA: Diagnosis not present

## 2017-02-05 DIAGNOSIS — K219 Gastro-esophageal reflux disease without esophagitis: Secondary | ICD-10-CM | POA: Diagnosis not present

## 2017-02-05 DIAGNOSIS — C9110 Chronic lymphocytic leukemia of B-cell type not having achieved remission: Secondary | ICD-10-CM | POA: Diagnosis not present

## 2017-02-05 DIAGNOSIS — D696 Thrombocytopenia, unspecified: Secondary | ICD-10-CM | POA: Diagnosis not present

## 2017-02-05 DIAGNOSIS — L03314 Cellulitis of groin: Secondary | ICD-10-CM | POA: Diagnosis not present

## 2017-02-06 DIAGNOSIS — S31104D Unspecified open wound of abdominal wall, left lower quadrant without penetration into peritoneal cavity, subsequent encounter: Secondary | ICD-10-CM | POA: Diagnosis not present

## 2017-02-07 DIAGNOSIS — L03314 Cellulitis of groin: Secondary | ICD-10-CM | POA: Diagnosis not present

## 2017-02-07 DIAGNOSIS — Z79891 Long term (current) use of opiate analgesic: Secondary | ICD-10-CM | POA: Diagnosis not present

## 2017-02-07 DIAGNOSIS — C9110 Chronic lymphocytic leukemia of B-cell type not having achieved remission: Secondary | ICD-10-CM | POA: Diagnosis not present

## 2017-02-07 DIAGNOSIS — K219 Gastro-esophageal reflux disease without esophagitis: Secondary | ICD-10-CM | POA: Diagnosis not present

## 2017-02-07 DIAGNOSIS — D819 Combined immunodeficiency, unspecified: Secondary | ICD-10-CM | POA: Diagnosis not present

## 2017-02-07 DIAGNOSIS — D696 Thrombocytopenia, unspecified: Secondary | ICD-10-CM | POA: Diagnosis not present

## 2017-02-07 DIAGNOSIS — D61818 Other pancytopenia: Secondary | ICD-10-CM | POA: Diagnosis not present

## 2017-02-07 DIAGNOSIS — L03311 Cellulitis of abdominal wall: Secondary | ICD-10-CM | POA: Diagnosis not present

## 2017-02-07 DIAGNOSIS — J209 Acute bronchitis, unspecified: Secondary | ICD-10-CM | POA: Diagnosis not present

## 2017-02-07 DIAGNOSIS — D735 Infarction of spleen: Secondary | ICD-10-CM | POA: Diagnosis not present

## 2017-02-07 DIAGNOSIS — F17210 Nicotine dependence, cigarettes, uncomplicated: Secondary | ICD-10-CM | POA: Diagnosis not present

## 2017-02-07 DIAGNOSIS — N189 Chronic kidney disease, unspecified: Secondary | ICD-10-CM | POA: Diagnosis not present

## 2017-02-10 DIAGNOSIS — G629 Polyneuropathy, unspecified: Secondary | ICD-10-CM | POA: Diagnosis not present

## 2017-02-10 DIAGNOSIS — K219 Gastro-esophageal reflux disease without esophagitis: Secondary | ICD-10-CM | POA: Diagnosis not present

## 2017-02-10 DIAGNOSIS — M170 Bilateral primary osteoarthritis of knee: Secondary | ICD-10-CM | POA: Diagnosis not present

## 2017-02-10 DIAGNOSIS — L03314 Cellulitis of groin: Secondary | ICD-10-CM | POA: Diagnosis not present

## 2017-02-10 DIAGNOSIS — C9110 Chronic lymphocytic leukemia of B-cell type not having achieved remission: Secondary | ICD-10-CM | POA: Diagnosis not present

## 2017-02-10 DIAGNOSIS — Z79891 Long term (current) use of opiate analgesic: Secondary | ICD-10-CM | POA: Diagnosis not present

## 2017-02-10 DIAGNOSIS — F17210 Nicotine dependence, cigarettes, uncomplicated: Secondary | ICD-10-CM | POA: Diagnosis not present

## 2017-02-10 DIAGNOSIS — G894 Chronic pain syndrome: Secondary | ICD-10-CM | POA: Diagnosis not present

## 2017-02-10 DIAGNOSIS — L97222 Non-pressure chronic ulcer of left calf with fat layer exposed: Secondary | ICD-10-CM | POA: Diagnosis not present

## 2017-02-10 DIAGNOSIS — D696 Thrombocytopenia, unspecified: Secondary | ICD-10-CM | POA: Diagnosis not present

## 2017-02-10 DIAGNOSIS — Z720 Tobacco use: Secondary | ICD-10-CM | POA: Diagnosis not present

## 2017-02-11 DIAGNOSIS — L97122 Non-pressure chronic ulcer of left thigh with fat layer exposed: Secondary | ICD-10-CM | POA: Diagnosis not present

## 2017-02-11 DIAGNOSIS — S71102A Unspecified open wound, left thigh, initial encounter: Secondary | ICD-10-CM | POA: Diagnosis not present

## 2017-02-12 DIAGNOSIS — Z79891 Long term (current) use of opiate analgesic: Secondary | ICD-10-CM | POA: Diagnosis not present

## 2017-02-12 DIAGNOSIS — C9110 Chronic lymphocytic leukemia of B-cell type not having achieved remission: Secondary | ICD-10-CM | POA: Diagnosis not present

## 2017-02-12 DIAGNOSIS — F17210 Nicotine dependence, cigarettes, uncomplicated: Secondary | ICD-10-CM | POA: Diagnosis not present

## 2017-02-12 DIAGNOSIS — D696 Thrombocytopenia, unspecified: Secondary | ICD-10-CM | POA: Diagnosis not present

## 2017-02-12 DIAGNOSIS — K219 Gastro-esophageal reflux disease without esophagitis: Secondary | ICD-10-CM | POA: Diagnosis not present

## 2017-02-12 DIAGNOSIS — L03314 Cellulitis of groin: Secondary | ICD-10-CM | POA: Diagnosis not present

## 2017-02-13 DIAGNOSIS — S71102A Unspecified open wound, left thigh, initial encounter: Secondary | ICD-10-CM | POA: Diagnosis not present

## 2017-02-14 DIAGNOSIS — K219 Gastro-esophageal reflux disease without esophagitis: Secondary | ICD-10-CM | POA: Diagnosis not present

## 2017-02-14 DIAGNOSIS — D819 Combined immunodeficiency, unspecified: Secondary | ICD-10-CM | POA: Diagnosis not present

## 2017-02-14 DIAGNOSIS — C9110 Chronic lymphocytic leukemia of B-cell type not having achieved remission: Secondary | ICD-10-CM | POA: Diagnosis not present

## 2017-02-14 DIAGNOSIS — Z79891 Long term (current) use of opiate analgesic: Secondary | ICD-10-CM | POA: Diagnosis not present

## 2017-02-14 DIAGNOSIS — L03314 Cellulitis of groin: Secondary | ICD-10-CM | POA: Diagnosis not present

## 2017-02-14 DIAGNOSIS — F17210 Nicotine dependence, cigarettes, uncomplicated: Secondary | ICD-10-CM | POA: Diagnosis not present

## 2017-02-14 DIAGNOSIS — D696 Thrombocytopenia, unspecified: Secondary | ICD-10-CM | POA: Diagnosis not present

## 2017-02-17 DIAGNOSIS — D696 Thrombocytopenia, unspecified: Secondary | ICD-10-CM | POA: Diagnosis not present

## 2017-02-17 DIAGNOSIS — L03314 Cellulitis of groin: Secondary | ICD-10-CM | POA: Diagnosis not present

## 2017-02-17 DIAGNOSIS — K219 Gastro-esophageal reflux disease without esophagitis: Secondary | ICD-10-CM | POA: Diagnosis not present

## 2017-02-17 DIAGNOSIS — F17210 Nicotine dependence, cigarettes, uncomplicated: Secondary | ICD-10-CM | POA: Diagnosis not present

## 2017-02-17 DIAGNOSIS — Z79891 Long term (current) use of opiate analgesic: Secondary | ICD-10-CM | POA: Diagnosis not present

## 2017-02-17 DIAGNOSIS — C9110 Chronic lymphocytic leukemia of B-cell type not having achieved remission: Secondary | ICD-10-CM | POA: Diagnosis not present

## 2017-02-18 DIAGNOSIS — L97122 Non-pressure chronic ulcer of left thigh with fat layer exposed: Secondary | ICD-10-CM | POA: Diagnosis not present

## 2017-02-18 DIAGNOSIS — S71102A Unspecified open wound, left thigh, initial encounter: Secondary | ICD-10-CM | POA: Diagnosis not present

## 2017-02-19 DIAGNOSIS — D696 Thrombocytopenia, unspecified: Secondary | ICD-10-CM | POA: Diagnosis not present

## 2017-02-19 DIAGNOSIS — L03314 Cellulitis of groin: Secondary | ICD-10-CM | POA: Diagnosis not present

## 2017-02-19 DIAGNOSIS — K219 Gastro-esophageal reflux disease without esophagitis: Secondary | ICD-10-CM | POA: Diagnosis not present

## 2017-02-19 DIAGNOSIS — C9110 Chronic lymphocytic leukemia of B-cell type not having achieved remission: Secondary | ICD-10-CM | POA: Diagnosis not present

## 2017-02-19 DIAGNOSIS — Z79891 Long term (current) use of opiate analgesic: Secondary | ICD-10-CM | POA: Diagnosis not present

## 2017-02-19 DIAGNOSIS — F17210 Nicotine dependence, cigarettes, uncomplicated: Secondary | ICD-10-CM | POA: Diagnosis not present

## 2017-02-24 DIAGNOSIS — D696 Thrombocytopenia, unspecified: Secondary | ICD-10-CM | POA: Diagnosis not present

## 2017-02-24 DIAGNOSIS — C9110 Chronic lymphocytic leukemia of B-cell type not having achieved remission: Secondary | ICD-10-CM | POA: Diagnosis not present

## 2017-02-24 DIAGNOSIS — K219 Gastro-esophageal reflux disease without esophagitis: Secondary | ICD-10-CM | POA: Diagnosis not present

## 2017-02-24 DIAGNOSIS — Z79891 Long term (current) use of opiate analgesic: Secondary | ICD-10-CM | POA: Diagnosis not present

## 2017-02-24 DIAGNOSIS — F17210 Nicotine dependence, cigarettes, uncomplicated: Secondary | ICD-10-CM | POA: Diagnosis not present

## 2017-02-24 DIAGNOSIS — L03314 Cellulitis of groin: Secondary | ICD-10-CM | POA: Diagnosis not present

## 2017-02-25 DIAGNOSIS — S71102A Unspecified open wound, left thigh, initial encounter: Secondary | ICD-10-CM | POA: Diagnosis not present

## 2017-02-25 DIAGNOSIS — D696 Thrombocytopenia, unspecified: Secondary | ICD-10-CM | POA: Diagnosis not present

## 2017-02-25 DIAGNOSIS — L97122 Non-pressure chronic ulcer of left thigh with fat layer exposed: Secondary | ICD-10-CM | POA: Diagnosis not present

## 2017-02-25 DIAGNOSIS — K219 Gastro-esophageal reflux disease without esophagitis: Secondary | ICD-10-CM | POA: Diagnosis not present

## 2017-02-25 DIAGNOSIS — S71101A Unspecified open wound, right thigh, initial encounter: Secondary | ICD-10-CM | POA: Diagnosis not present

## 2017-02-25 DIAGNOSIS — Z79891 Long term (current) use of opiate analgesic: Secondary | ICD-10-CM | POA: Diagnosis not present

## 2017-02-25 DIAGNOSIS — C9110 Chronic lymphocytic leukemia of B-cell type not having achieved remission: Secondary | ICD-10-CM | POA: Diagnosis not present

## 2017-02-25 DIAGNOSIS — L97112 Non-pressure chronic ulcer of right thigh with fat layer exposed: Secondary | ICD-10-CM | POA: Diagnosis not present

## 2017-02-25 DIAGNOSIS — L03314 Cellulitis of groin: Secondary | ICD-10-CM | POA: Diagnosis not present

## 2017-02-25 DIAGNOSIS — F17210 Nicotine dependence, cigarettes, uncomplicated: Secondary | ICD-10-CM | POA: Diagnosis not present

## 2017-02-26 DIAGNOSIS — Z79891 Long term (current) use of opiate analgesic: Secondary | ICD-10-CM | POA: Diagnosis not present

## 2017-02-26 DIAGNOSIS — F17210 Nicotine dependence, cigarettes, uncomplicated: Secondary | ICD-10-CM | POA: Diagnosis not present

## 2017-02-26 DIAGNOSIS — D696 Thrombocytopenia, unspecified: Secondary | ICD-10-CM | POA: Diagnosis not present

## 2017-02-26 DIAGNOSIS — C9110 Chronic lymphocytic leukemia of B-cell type not having achieved remission: Secondary | ICD-10-CM | POA: Diagnosis not present

## 2017-02-26 DIAGNOSIS — K219 Gastro-esophageal reflux disease without esophagitis: Secondary | ICD-10-CM | POA: Diagnosis not present

## 2017-02-26 DIAGNOSIS — L03314 Cellulitis of groin: Secondary | ICD-10-CM | POA: Diagnosis not present

## 2017-02-27 DIAGNOSIS — S31103A Unspecified open wound of abdominal wall, right lower quadrant without penetration into peritoneal cavity, initial encounter: Secondary | ICD-10-CM | POA: Diagnosis not present

## 2017-02-28 DIAGNOSIS — F17210 Nicotine dependence, cigarettes, uncomplicated: Secondary | ICD-10-CM | POA: Diagnosis not present

## 2017-02-28 DIAGNOSIS — K219 Gastro-esophageal reflux disease without esophagitis: Secondary | ICD-10-CM | POA: Diagnosis not present

## 2017-02-28 DIAGNOSIS — D61818 Other pancytopenia: Secondary | ICD-10-CM | POA: Diagnosis not present

## 2017-02-28 DIAGNOSIS — D819 Combined immunodeficiency, unspecified: Secondary | ICD-10-CM | POA: Diagnosis not present

## 2017-02-28 DIAGNOSIS — L03314 Cellulitis of groin: Secondary | ICD-10-CM | POA: Diagnosis not present

## 2017-02-28 DIAGNOSIS — C9110 Chronic lymphocytic leukemia of B-cell type not having achieved remission: Secondary | ICD-10-CM | POA: Diagnosis not present

## 2017-02-28 DIAGNOSIS — D696 Thrombocytopenia, unspecified: Secondary | ICD-10-CM | POA: Diagnosis not present

## 2017-02-28 DIAGNOSIS — N189 Chronic kidney disease, unspecified: Secondary | ICD-10-CM | POA: Diagnosis not present

## 2017-02-28 DIAGNOSIS — Z79891 Long term (current) use of opiate analgesic: Secondary | ICD-10-CM | POA: Diagnosis not present

## 2017-03-03 DIAGNOSIS — L03314 Cellulitis of groin: Secondary | ICD-10-CM | POA: Diagnosis not present

## 2017-03-03 DIAGNOSIS — D696 Thrombocytopenia, unspecified: Secondary | ICD-10-CM | POA: Diagnosis not present

## 2017-03-03 DIAGNOSIS — Z79891 Long term (current) use of opiate analgesic: Secondary | ICD-10-CM | POA: Diagnosis not present

## 2017-03-03 DIAGNOSIS — K219 Gastro-esophageal reflux disease without esophagitis: Secondary | ICD-10-CM | POA: Diagnosis not present

## 2017-03-03 DIAGNOSIS — C9110 Chronic lymphocytic leukemia of B-cell type not having achieved remission: Secondary | ICD-10-CM | POA: Diagnosis not present

## 2017-03-03 DIAGNOSIS — F17210 Nicotine dependence, cigarettes, uncomplicated: Secondary | ICD-10-CM | POA: Diagnosis not present

## 2017-03-04 DIAGNOSIS — S71101A Unspecified open wound, right thigh, initial encounter: Secondary | ICD-10-CM | POA: Diagnosis not present

## 2017-03-04 DIAGNOSIS — L97122 Non-pressure chronic ulcer of left thigh with fat layer exposed: Secondary | ICD-10-CM | POA: Diagnosis not present

## 2017-03-04 DIAGNOSIS — S71102A Unspecified open wound, left thigh, initial encounter: Secondary | ICD-10-CM | POA: Diagnosis not present

## 2017-03-04 DIAGNOSIS — L97112 Non-pressure chronic ulcer of right thigh with fat layer exposed: Secondary | ICD-10-CM | POA: Diagnosis not present

## 2017-03-05 DIAGNOSIS — C9110 Chronic lymphocytic leukemia of B-cell type not having achieved remission: Secondary | ICD-10-CM | POA: Diagnosis not present

## 2017-03-05 DIAGNOSIS — L03314 Cellulitis of groin: Secondary | ICD-10-CM | POA: Diagnosis not present

## 2017-03-05 DIAGNOSIS — F17210 Nicotine dependence, cigarettes, uncomplicated: Secondary | ICD-10-CM | POA: Diagnosis not present

## 2017-03-05 DIAGNOSIS — D696 Thrombocytopenia, unspecified: Secondary | ICD-10-CM | POA: Diagnosis not present

## 2017-03-05 DIAGNOSIS — K219 Gastro-esophageal reflux disease without esophagitis: Secondary | ICD-10-CM | POA: Diagnosis not present

## 2017-03-05 DIAGNOSIS — Z79891 Long term (current) use of opiate analgesic: Secondary | ICD-10-CM | POA: Diagnosis not present

## 2017-03-06 DIAGNOSIS — I10 Essential (primary) hypertension: Secondary | ICD-10-CM | POA: Diagnosis not present

## 2017-03-06 DIAGNOSIS — R0600 Dyspnea, unspecified: Secondary | ICD-10-CM | POA: Diagnosis not present

## 2017-03-06 DIAGNOSIS — E8881 Metabolic syndrome: Secondary | ICD-10-CM | POA: Diagnosis not present

## 2017-03-06 DIAGNOSIS — R600 Localized edema: Secondary | ICD-10-CM | POA: Diagnosis not present

## 2017-03-06 DIAGNOSIS — E781 Pure hyperglyceridemia: Secondary | ICD-10-CM | POA: Diagnosis not present

## 2017-03-06 DIAGNOSIS — C9191 Lymphoid leukemia, unspecified, in remission: Secondary | ICD-10-CM | POA: Diagnosis not present

## 2017-03-06 DIAGNOSIS — R252 Cramp and spasm: Secondary | ICD-10-CM | POA: Diagnosis not present

## 2017-03-06 DIAGNOSIS — M810 Age-related osteoporosis without current pathological fracture: Secondary | ICD-10-CM | POA: Diagnosis not present

## 2017-03-06 DIAGNOSIS — S71101A Unspecified open wound, right thigh, initial encounter: Secondary | ICD-10-CM | POA: Diagnosis not present

## 2017-03-06 DIAGNOSIS — S71102A Unspecified open wound, left thigh, initial encounter: Secondary | ICD-10-CM | POA: Diagnosis not present

## 2017-03-06 DIAGNOSIS — J309 Allergic rhinitis, unspecified: Secondary | ICD-10-CM | POA: Diagnosis not present

## 2017-03-06 DIAGNOSIS — M159 Polyosteoarthritis, unspecified: Secondary | ICD-10-CM | POA: Diagnosis not present

## 2017-03-06 DIAGNOSIS — K219 Gastro-esophageal reflux disease without esophagitis: Secondary | ICD-10-CM | POA: Diagnosis not present

## 2017-03-07 DIAGNOSIS — C9110 Chronic lymphocytic leukemia of B-cell type not having achieved remission: Secondary | ICD-10-CM | POA: Diagnosis not present

## 2017-03-07 DIAGNOSIS — D819 Combined immunodeficiency, unspecified: Secondary | ICD-10-CM | POA: Diagnosis not present

## 2017-03-07 DIAGNOSIS — Z79891 Long term (current) use of opiate analgesic: Secondary | ICD-10-CM | POA: Diagnosis not present

## 2017-03-07 DIAGNOSIS — K219 Gastro-esophageal reflux disease without esophagitis: Secondary | ICD-10-CM | POA: Diagnosis not present

## 2017-03-07 DIAGNOSIS — L03314 Cellulitis of groin: Secondary | ICD-10-CM | POA: Diagnosis not present

## 2017-03-07 DIAGNOSIS — F17210 Nicotine dependence, cigarettes, uncomplicated: Secondary | ICD-10-CM | POA: Diagnosis not present

## 2017-03-07 DIAGNOSIS — D696 Thrombocytopenia, unspecified: Secondary | ICD-10-CM | POA: Diagnosis not present

## 2017-03-11 DIAGNOSIS — L97122 Non-pressure chronic ulcer of left thigh with fat layer exposed: Secondary | ICD-10-CM | POA: Diagnosis not present

## 2017-03-11 DIAGNOSIS — S71102A Unspecified open wound, left thigh, initial encounter: Secondary | ICD-10-CM | POA: Diagnosis not present

## 2017-03-11 DIAGNOSIS — L97112 Non-pressure chronic ulcer of right thigh with fat layer exposed: Secondary | ICD-10-CM | POA: Diagnosis not present

## 2017-03-11 DIAGNOSIS — S71101A Unspecified open wound, right thigh, initial encounter: Secondary | ICD-10-CM | POA: Diagnosis not present

## 2017-03-12 DIAGNOSIS — Z79891 Long term (current) use of opiate analgesic: Secondary | ICD-10-CM | POA: Diagnosis not present

## 2017-03-12 DIAGNOSIS — G894 Chronic pain syndrome: Secondary | ICD-10-CM | POA: Diagnosis not present

## 2017-03-12 DIAGNOSIS — G629 Polyneuropathy, unspecified: Secondary | ICD-10-CM | POA: Diagnosis not present

## 2017-03-12 DIAGNOSIS — K219 Gastro-esophageal reflux disease without esophagitis: Secondary | ICD-10-CM | POA: Diagnosis not present

## 2017-03-12 DIAGNOSIS — C9110 Chronic lymphocytic leukemia of B-cell type not having achieved remission: Secondary | ICD-10-CM | POA: Diagnosis not present

## 2017-03-12 DIAGNOSIS — L03314 Cellulitis of groin: Secondary | ICD-10-CM | POA: Diagnosis not present

## 2017-03-12 DIAGNOSIS — M170 Bilateral primary osteoarthritis of knee: Secondary | ICD-10-CM | POA: Diagnosis not present

## 2017-03-12 DIAGNOSIS — F17210 Nicotine dependence, cigarettes, uncomplicated: Secondary | ICD-10-CM | POA: Diagnosis not present

## 2017-03-12 DIAGNOSIS — D696 Thrombocytopenia, unspecified: Secondary | ICD-10-CM | POA: Diagnosis not present

## 2017-03-12 DIAGNOSIS — Z720 Tobacco use: Secondary | ICD-10-CM | POA: Diagnosis not present

## 2017-03-12 DIAGNOSIS — L8990 Pressure ulcer of unspecified site, unspecified stage: Secondary | ICD-10-CM | POA: Diagnosis not present

## 2017-03-12 DIAGNOSIS — L97222 Non-pressure chronic ulcer of left calf with fat layer exposed: Secondary | ICD-10-CM | POA: Diagnosis not present

## 2017-03-18 DIAGNOSIS — S71102A Unspecified open wound, left thigh, initial encounter: Secondary | ICD-10-CM | POA: Diagnosis not present

## 2017-03-18 DIAGNOSIS — S31113A Laceration without foreign body of abdominal wall, right lower quadrant without penetration into peritoneal cavity, initial encounter: Secondary | ICD-10-CM | POA: Diagnosis not present

## 2017-03-18 DIAGNOSIS — S31114A Laceration without foreign body of abdominal wall, left lower quadrant without penetration into peritoneal cavity, initial encounter: Secondary | ICD-10-CM | POA: Diagnosis not present

## 2017-03-18 DIAGNOSIS — S71101A Unspecified open wound, right thigh, initial encounter: Secondary | ICD-10-CM | POA: Diagnosis not present

## 2017-03-21 DIAGNOSIS — D819 Combined immunodeficiency, unspecified: Secondary | ICD-10-CM | POA: Diagnosis not present

## 2017-03-21 DIAGNOSIS — C9110 Chronic lymphocytic leukemia of B-cell type not having achieved remission: Secondary | ICD-10-CM | POA: Diagnosis not present

## 2017-03-21 DIAGNOSIS — N189 Chronic kidney disease, unspecified: Secondary | ICD-10-CM | POA: Diagnosis not present

## 2017-03-21 DIAGNOSIS — L299 Pruritus, unspecified: Secondary | ICD-10-CM | POA: Diagnosis not present

## 2017-03-24 DIAGNOSIS — L98492 Non-pressure chronic ulcer of skin of other sites with fat layer exposed: Secondary | ICD-10-CM | POA: Diagnosis not present

## 2017-03-24 DIAGNOSIS — S31104A Unspecified open wound of abdominal wall, left lower quadrant without penetration into peritoneal cavity, initial encounter: Secondary | ICD-10-CM | POA: Diagnosis not present

## 2017-03-24 DIAGNOSIS — S31103A Unspecified open wound of abdominal wall, right lower quadrant without penetration into peritoneal cavity, initial encounter: Secondary | ICD-10-CM | POA: Diagnosis not present

## 2017-03-27 DIAGNOSIS — E875 Hyperkalemia: Secondary | ICD-10-CM | POA: Diagnosis not present

## 2017-03-27 DIAGNOSIS — D61818 Other pancytopenia: Secondary | ICD-10-CM | POA: Diagnosis not present

## 2017-03-27 DIAGNOSIS — N189 Chronic kidney disease, unspecified: Secondary | ICD-10-CM | POA: Diagnosis not present

## 2017-03-27 DIAGNOSIS — R161 Splenomegaly, not elsewhere classified: Secondary | ICD-10-CM | POA: Diagnosis not present

## 2017-03-27 DIAGNOSIS — D819 Combined immunodeficiency, unspecified: Secondary | ICD-10-CM | POA: Diagnosis not present

## 2017-03-27 DIAGNOSIS — C9110 Chronic lymphocytic leukemia of B-cell type not having achieved remission: Secondary | ICD-10-CM | POA: Diagnosis not present

## 2017-04-04 DIAGNOSIS — D819 Combined immunodeficiency, unspecified: Secondary | ICD-10-CM | POA: Diagnosis not present

## 2017-04-04 DIAGNOSIS — C9110 Chronic lymphocytic leukemia of B-cell type not having achieved remission: Secondary | ICD-10-CM | POA: Diagnosis not present

## 2017-04-04 DIAGNOSIS — R161 Splenomegaly, not elsewhere classified: Secondary | ICD-10-CM | POA: Diagnosis not present

## 2017-04-04 DIAGNOSIS — N189 Chronic kidney disease, unspecified: Secondary | ICD-10-CM | POA: Diagnosis not present

## 2017-04-04 DIAGNOSIS — D61818 Other pancytopenia: Secondary | ICD-10-CM | POA: Diagnosis not present

## 2017-04-07 ENCOUNTER — Other Ambulatory Visit (HOSPITAL_COMMUNITY)
Admission: RE | Admit: 2017-04-07 | Disposition: A | Discharge status: Home / Self Care | Payer: Medicare Other | Source: Ambulatory Visit | Attending: Oncology | Admitting: Oncology

## 2017-04-07 DIAGNOSIS — D61818 Other pancytopenia: Secondary | ICD-10-CM | POA: Diagnosis not present

## 2017-04-07 DIAGNOSIS — Z856 Personal history of leukemia: Secondary | ICD-10-CM | POA: Diagnosis not present

## 2017-04-07 DIAGNOSIS — C9110 Chronic lymphocytic leukemia of B-cell type not having achieved remission: Secondary | ICD-10-CM | POA: Diagnosis not present

## 2017-04-08 DIAGNOSIS — S71101A Unspecified open wound, right thigh, initial encounter: Secondary | ICD-10-CM | POA: Diagnosis not present

## 2017-04-08 DIAGNOSIS — S71102A Unspecified open wound, left thigh, initial encounter: Secondary | ICD-10-CM | POA: Diagnosis not present

## 2017-04-08 DIAGNOSIS — L928 Other granulomatous disorders of the skin and subcutaneous tissue: Secondary | ICD-10-CM | POA: Diagnosis not present

## 2017-04-11 DIAGNOSIS — R161 Splenomegaly, not elsewhere classified: Secondary | ICD-10-CM | POA: Diagnosis not present

## 2017-04-11 DIAGNOSIS — R739 Hyperglycemia, unspecified: Secondary | ICD-10-CM | POA: Diagnosis not present

## 2017-04-11 DIAGNOSIS — N189 Chronic kidney disease, unspecified: Secondary | ICD-10-CM | POA: Diagnosis not present

## 2017-04-11 DIAGNOSIS — C9110 Chronic lymphocytic leukemia of B-cell type not having achieved remission: Secondary | ICD-10-CM | POA: Diagnosis not present

## 2017-04-11 DIAGNOSIS — D819 Combined immunodeficiency, unspecified: Secondary | ICD-10-CM | POA: Diagnosis not present

## 2017-04-11 DIAGNOSIS — D61818 Other pancytopenia: Secondary | ICD-10-CM | POA: Diagnosis not present

## 2017-04-11 DIAGNOSIS — E876 Hypokalemia: Secondary | ICD-10-CM | POA: Diagnosis not present

## 2017-04-14 DIAGNOSIS — G629 Polyneuropathy, unspecified: Secondary | ICD-10-CM | POA: Diagnosis not present

## 2017-04-14 DIAGNOSIS — Z720 Tobacco use: Secondary | ICD-10-CM | POA: Diagnosis not present

## 2017-04-14 DIAGNOSIS — L8990 Pressure ulcer of unspecified site, unspecified stage: Secondary | ICD-10-CM | POA: Diagnosis not present

## 2017-04-14 DIAGNOSIS — M170 Bilateral primary osteoarthritis of knee: Secondary | ICD-10-CM | POA: Diagnosis not present

## 2017-04-14 DIAGNOSIS — Z79891 Long term (current) use of opiate analgesic: Secondary | ICD-10-CM | POA: Diagnosis not present

## 2017-04-14 DIAGNOSIS — K611 Rectal abscess: Secondary | ICD-10-CM | POA: Diagnosis not present

## 2017-04-14 DIAGNOSIS — L97222 Non-pressure chronic ulcer of left calf with fat layer exposed: Secondary | ICD-10-CM | POA: Diagnosis not present

## 2017-04-14 DIAGNOSIS — G894 Chronic pain syndrome: Secondary | ICD-10-CM | POA: Diagnosis not present

## 2017-04-15 DIAGNOSIS — L98492 Non-pressure chronic ulcer of skin of other sites with fat layer exposed: Secondary | ICD-10-CM | POA: Diagnosis not present

## 2017-04-15 DIAGNOSIS — S71102A Unspecified open wound, left thigh, initial encounter: Secondary | ICD-10-CM | POA: Diagnosis not present

## 2017-04-15 DIAGNOSIS — S71101A Unspecified open wound, right thigh, initial encounter: Secondary | ICD-10-CM | POA: Diagnosis not present

## 2017-04-18 DIAGNOSIS — D819 Combined immunodeficiency, unspecified: Secondary | ICD-10-CM | POA: Diagnosis not present

## 2017-04-18 DIAGNOSIS — C9110 Chronic lymphocytic leukemia of B-cell type not having achieved remission: Secondary | ICD-10-CM | POA: Diagnosis not present

## 2017-04-21 DIAGNOSIS — R600 Localized edema: Secondary | ICD-10-CM | POA: Diagnosis not present

## 2017-04-21 DIAGNOSIS — D819 Combined immunodeficiency, unspecified: Secondary | ICD-10-CM | POA: Diagnosis not present

## 2017-04-21 DIAGNOSIS — C9110 Chronic lymphocytic leukemia of B-cell type not having achieved remission: Secondary | ICD-10-CM | POA: Diagnosis not present

## 2017-04-22 ENCOUNTER — Encounter (HOSPITAL_COMMUNITY): Payer: Self-pay

## 2017-04-22 DIAGNOSIS — S71101A Unspecified open wound, right thigh, initial encounter: Secondary | ICD-10-CM | POA: Diagnosis not present

## 2017-04-22 LAB — CHROMOSOME ANALYSIS, BONE MARROW

## 2017-04-23 DIAGNOSIS — Z452 Encounter for adjustment and management of vascular access device: Secondary | ICD-10-CM | POA: Diagnosis not present

## 2017-04-23 DIAGNOSIS — C9110 Chronic lymphocytic leukemia of B-cell type not having achieved remission: Secondary | ICD-10-CM | POA: Diagnosis not present

## 2017-04-23 DIAGNOSIS — S71101A Unspecified open wound, right thigh, initial encounter: Secondary | ICD-10-CM | POA: Diagnosis not present

## 2017-04-24 DIAGNOSIS — C9110 Chronic lymphocytic leukemia of B-cell type not having achieved remission: Secondary | ICD-10-CM | POA: Diagnosis not present

## 2017-04-24 DIAGNOSIS — D819 Combined immunodeficiency, unspecified: Secondary | ICD-10-CM | POA: Diagnosis not present

## 2017-04-25 DIAGNOSIS — Z6835 Body mass index (BMI) 35.0-35.9, adult: Secondary | ICD-10-CM | POA: Diagnosis not present

## 2017-04-25 DIAGNOSIS — E785 Hyperlipidemia, unspecified: Secondary | ICD-10-CM | POA: Diagnosis not present

## 2017-04-25 DIAGNOSIS — S71102A Unspecified open wound, left thigh, initial encounter: Secondary | ICD-10-CM | POA: Diagnosis not present

## 2017-04-25 DIAGNOSIS — Z9181 History of falling: Secondary | ICD-10-CM | POA: Diagnosis not present

## 2017-04-25 DIAGNOSIS — Z0000 Encounter for general adult medical examination without abnormal findings: Secondary | ICD-10-CM | POA: Diagnosis not present

## 2017-04-25 DIAGNOSIS — Z1331 Encounter for screening for depression: Secondary | ICD-10-CM | POA: Diagnosis not present

## 2017-04-25 DIAGNOSIS — S71101A Unspecified open wound, right thigh, initial encounter: Secondary | ICD-10-CM | POA: Diagnosis not present

## 2017-04-28 DIAGNOSIS — S71101A Unspecified open wound, right thigh, initial encounter: Secondary | ICD-10-CM | POA: Diagnosis not present

## 2017-04-28 DIAGNOSIS — S31119A Laceration without foreign body of abdominal wall, unspecified quadrant without penetration into peritoneal cavity, initial encounter: Secondary | ICD-10-CM | POA: Diagnosis not present

## 2017-04-28 DIAGNOSIS — S71102A Unspecified open wound, left thigh, initial encounter: Secondary | ICD-10-CM | POA: Diagnosis not present

## 2017-04-29 DIAGNOSIS — S71101A Unspecified open wound, right thigh, initial encounter: Secondary | ICD-10-CM | POA: Diagnosis not present

## 2017-04-29 DIAGNOSIS — S71102A Unspecified open wound, left thigh, initial encounter: Secondary | ICD-10-CM | POA: Diagnosis not present

## 2017-04-30 DIAGNOSIS — S71101A Unspecified open wound, right thigh, initial encounter: Secondary | ICD-10-CM | POA: Diagnosis not present

## 2017-04-30 DIAGNOSIS — S71102A Unspecified open wound, left thigh, initial encounter: Secondary | ICD-10-CM | POA: Diagnosis not present

## 2017-05-01 DIAGNOSIS — S71102A Unspecified open wound, left thigh, initial encounter: Secondary | ICD-10-CM | POA: Diagnosis not present

## 2017-05-01 DIAGNOSIS — C9110 Chronic lymphocytic leukemia of B-cell type not having achieved remission: Secondary | ICD-10-CM | POA: Diagnosis not present

## 2017-05-01 DIAGNOSIS — S71101A Unspecified open wound, right thigh, initial encounter: Secondary | ICD-10-CM | POA: Diagnosis not present

## 2017-05-02 DIAGNOSIS — R161 Splenomegaly, not elsewhere classified: Secondary | ICD-10-CM | POA: Diagnosis not present

## 2017-05-02 DIAGNOSIS — N189 Chronic kidney disease, unspecified: Secondary | ICD-10-CM | POA: Diagnosis not present

## 2017-05-02 DIAGNOSIS — C9110 Chronic lymphocytic leukemia of B-cell type not having achieved remission: Secondary | ICD-10-CM | POA: Diagnosis not present

## 2017-05-02 DIAGNOSIS — D819 Combined immunodeficiency, unspecified: Secondary | ICD-10-CM | POA: Diagnosis not present

## 2017-05-02 DIAGNOSIS — Z8751 Personal history of pre-term labor: Secondary | ICD-10-CM | POA: Diagnosis not present

## 2017-05-05 DIAGNOSIS — S71102A Unspecified open wound, left thigh, initial encounter: Secondary | ICD-10-CM | POA: Diagnosis not present

## 2017-05-05 DIAGNOSIS — S71101A Unspecified open wound, right thigh, initial encounter: Secondary | ICD-10-CM | POA: Diagnosis not present

## 2017-05-06 DIAGNOSIS — S71102A Unspecified open wound, left thigh, initial encounter: Secondary | ICD-10-CM | POA: Diagnosis not present

## 2017-05-06 DIAGNOSIS — S71101A Unspecified open wound, right thigh, initial encounter: Secondary | ICD-10-CM | POA: Diagnosis not present

## 2017-05-06 DIAGNOSIS — L97122 Non-pressure chronic ulcer of left thigh with fat layer exposed: Secondary | ICD-10-CM | POA: Diagnosis not present

## 2017-05-06 DIAGNOSIS — L97112 Non-pressure chronic ulcer of right thigh with fat layer exposed: Secondary | ICD-10-CM | POA: Diagnosis not present

## 2017-05-07 DIAGNOSIS — S71102A Unspecified open wound, left thigh, initial encounter: Secondary | ICD-10-CM | POA: Diagnosis not present

## 2017-05-07 DIAGNOSIS — C9110 Chronic lymphocytic leukemia of B-cell type not having achieved remission: Secondary | ICD-10-CM | POA: Diagnosis not present

## 2017-05-07 DIAGNOSIS — R600 Localized edema: Secondary | ICD-10-CM | POA: Diagnosis not present

## 2017-05-07 DIAGNOSIS — J309 Allergic rhinitis, unspecified: Secondary | ICD-10-CM | POA: Diagnosis not present

## 2017-05-07 DIAGNOSIS — Z0001 Encounter for general adult medical examination with abnormal findings: Secondary | ICD-10-CM | POA: Diagnosis not present

## 2017-05-07 DIAGNOSIS — R252 Cramp and spasm: Secondary | ICD-10-CM | POA: Diagnosis not present

## 2017-05-07 DIAGNOSIS — S71101A Unspecified open wound, right thigh, initial encounter: Secondary | ICD-10-CM | POA: Diagnosis not present

## 2017-05-07 DIAGNOSIS — M159 Polyosteoarthritis, unspecified: Secondary | ICD-10-CM | POA: Diagnosis not present

## 2017-05-07 DIAGNOSIS — I10 Essential (primary) hypertension: Secondary | ICD-10-CM | POA: Diagnosis not present

## 2017-05-07 DIAGNOSIS — D649 Anemia, unspecified: Secondary | ICD-10-CM | POA: Diagnosis not present

## 2017-05-07 DIAGNOSIS — E8881 Metabolic syndrome: Secondary | ICD-10-CM | POA: Diagnosis not present

## 2017-05-07 DIAGNOSIS — E781 Pure hyperglyceridemia: Secondary | ICD-10-CM | POA: Diagnosis not present

## 2017-05-07 DIAGNOSIS — R0600 Dyspnea, unspecified: Secondary | ICD-10-CM | POA: Diagnosis not present

## 2017-05-07 DIAGNOSIS — K219 Gastro-esophageal reflux disease without esophagitis: Secondary | ICD-10-CM | POA: Diagnosis not present

## 2017-05-07 DIAGNOSIS — M810 Age-related osteoporosis without current pathological fracture: Secondary | ICD-10-CM | POA: Diagnosis not present

## 2017-05-07 DIAGNOSIS — C9192 Lymphoid leukemia, unspecified, in relapse: Secondary | ICD-10-CM | POA: Diagnosis not present

## 2017-05-08 DIAGNOSIS — C9110 Chronic lymphocytic leukemia of B-cell type not having achieved remission: Secondary | ICD-10-CM | POA: Diagnosis not present

## 2017-05-09 DIAGNOSIS — S71102A Unspecified open wound, left thigh, initial encounter: Secondary | ICD-10-CM | POA: Diagnosis not present

## 2017-05-09 DIAGNOSIS — S71101A Unspecified open wound, right thigh, initial encounter: Secondary | ICD-10-CM | POA: Diagnosis not present

## 2017-05-12 DIAGNOSIS — M170 Bilateral primary osteoarthritis of knee: Secondary | ICD-10-CM | POA: Diagnosis not present

## 2017-05-12 DIAGNOSIS — S71102A Unspecified open wound, left thigh, initial encounter: Secondary | ICD-10-CM | POA: Diagnosis not present

## 2017-05-12 DIAGNOSIS — L8990 Pressure ulcer of unspecified site, unspecified stage: Secondary | ICD-10-CM | POA: Diagnosis not present

## 2017-05-12 DIAGNOSIS — S71101A Unspecified open wound, right thigh, initial encounter: Secondary | ICD-10-CM | POA: Diagnosis not present

## 2017-05-12 DIAGNOSIS — G629 Polyneuropathy, unspecified: Secondary | ICD-10-CM | POA: Diagnosis not present

## 2017-05-12 DIAGNOSIS — Z79891 Long term (current) use of opiate analgesic: Secondary | ICD-10-CM | POA: Diagnosis not present

## 2017-05-12 DIAGNOSIS — G894 Chronic pain syndrome: Secondary | ICD-10-CM | POA: Diagnosis not present

## 2017-05-12 DIAGNOSIS — Z720 Tobacco use: Secondary | ICD-10-CM | POA: Diagnosis not present

## 2017-05-12 DIAGNOSIS — K611 Rectal abscess: Secondary | ICD-10-CM | POA: Diagnosis not present

## 2017-05-12 DIAGNOSIS — L97222 Non-pressure chronic ulcer of left calf with fat layer exposed: Secondary | ICD-10-CM | POA: Diagnosis not present

## 2017-05-13 DIAGNOSIS — S71101A Unspecified open wound, right thigh, initial encounter: Secondary | ICD-10-CM | POA: Diagnosis not present

## 2017-05-13 DIAGNOSIS — S71102A Unspecified open wound, left thigh, initial encounter: Secondary | ICD-10-CM | POA: Diagnosis not present

## 2017-05-14 DIAGNOSIS — C9110 Chronic lymphocytic leukemia of B-cell type not having achieved remission: Secondary | ICD-10-CM | POA: Diagnosis not present

## 2017-05-14 DIAGNOSIS — S71102A Unspecified open wound, left thigh, initial encounter: Secondary | ICD-10-CM | POA: Diagnosis not present

## 2017-05-14 DIAGNOSIS — D819 Combined immunodeficiency, unspecified: Secondary | ICD-10-CM | POA: Diagnosis not present

## 2017-05-14 DIAGNOSIS — S71101A Unspecified open wound, right thigh, initial encounter: Secondary | ICD-10-CM | POA: Diagnosis not present

## 2017-05-15 DIAGNOSIS — H2513 Age-related nuclear cataract, bilateral: Secondary | ICD-10-CM | POA: Diagnosis not present

## 2017-05-15 DIAGNOSIS — L97122 Non-pressure chronic ulcer of left thigh with fat layer exposed: Secondary | ICD-10-CM | POA: Diagnosis not present

## 2017-05-15 DIAGNOSIS — C9110 Chronic lymphocytic leukemia of B-cell type not having achieved remission: Secondary | ICD-10-CM | POA: Diagnosis not present

## 2017-05-15 DIAGNOSIS — H34823 Venous engorgement, bilateral: Secondary | ICD-10-CM | POA: Diagnosis not present

## 2017-05-15 DIAGNOSIS — D819 Combined immunodeficiency, unspecified: Secondary | ICD-10-CM | POA: Diagnosis not present

## 2017-05-16 DIAGNOSIS — S71102A Unspecified open wound, left thigh, initial encounter: Secondary | ICD-10-CM | POA: Diagnosis not present

## 2017-05-19 DIAGNOSIS — S71102A Unspecified open wound, left thigh, initial encounter: Secondary | ICD-10-CM | POA: Diagnosis not present

## 2017-05-19 DIAGNOSIS — S71101A Unspecified open wound, right thigh, initial encounter: Secondary | ICD-10-CM | POA: Diagnosis not present

## 2017-05-20 DIAGNOSIS — S71101A Unspecified open wound, right thigh, initial encounter: Secondary | ICD-10-CM | POA: Diagnosis not present

## 2017-05-20 DIAGNOSIS — S71102A Unspecified open wound, left thigh, initial encounter: Secondary | ICD-10-CM | POA: Diagnosis not present

## 2017-05-21 DIAGNOSIS — D819 Combined immunodeficiency, unspecified: Secondary | ICD-10-CM | POA: Diagnosis not present

## 2017-05-21 DIAGNOSIS — C9110 Chronic lymphocytic leukemia of B-cell type not having achieved remission: Secondary | ICD-10-CM | POA: Diagnosis not present

## 2017-05-22 DIAGNOSIS — C9110 Chronic lymphocytic leukemia of B-cell type not having achieved remission: Secondary | ICD-10-CM | POA: Diagnosis not present

## 2017-05-22 DIAGNOSIS — D819 Combined immunodeficiency, unspecified: Secondary | ICD-10-CM | POA: Diagnosis not present

## 2017-05-26 DIAGNOSIS — L98491 Non-pressure chronic ulcer of skin of other sites limited to breakdown of skin: Secondary | ICD-10-CM | POA: Diagnosis not present

## 2017-05-27 DIAGNOSIS — L0100 Impetigo, unspecified: Secondary | ICD-10-CM | POA: Diagnosis not present

## 2017-05-27 DIAGNOSIS — B372 Candidiasis of skin and nail: Secondary | ICD-10-CM | POA: Diagnosis not present

## 2017-05-27 DIAGNOSIS — L299 Pruritus, unspecified: Secondary | ICD-10-CM | POA: Diagnosis not present

## 2017-05-28 DIAGNOSIS — D819 Combined immunodeficiency, unspecified: Secondary | ICD-10-CM | POA: Diagnosis not present

## 2017-05-28 DIAGNOSIS — L98491 Non-pressure chronic ulcer of skin of other sites limited to breakdown of skin: Secondary | ICD-10-CM | POA: Insufficient documentation

## 2017-05-28 DIAGNOSIS — C9110 Chronic lymphocytic leukemia of B-cell type not having achieved remission: Secondary | ICD-10-CM | POA: Diagnosis not present

## 2017-05-29 DIAGNOSIS — C9110 Chronic lymphocytic leukemia of B-cell type not having achieved remission: Secondary | ICD-10-CM | POA: Diagnosis not present

## 2017-05-29 DIAGNOSIS — D819 Combined immunodeficiency, unspecified: Secondary | ICD-10-CM | POA: Diagnosis not present

## 2017-05-30 DIAGNOSIS — D708 Other neutropenia: Secondary | ICD-10-CM | POA: Diagnosis not present

## 2017-05-30 DIAGNOSIS — D819 Combined immunodeficiency, unspecified: Secondary | ICD-10-CM | POA: Diagnosis not present

## 2017-05-30 DIAGNOSIS — R161 Splenomegaly, not elsewhere classified: Secondary | ICD-10-CM | POA: Diagnosis not present

## 2017-05-30 DIAGNOSIS — N189 Chronic kidney disease, unspecified: Secondary | ICD-10-CM | POA: Diagnosis not present

## 2017-05-30 DIAGNOSIS — D72819 Decreased white blood cell count, unspecified: Secondary | ICD-10-CM | POA: Diagnosis not present

## 2017-05-30 DIAGNOSIS — D61818 Other pancytopenia: Secondary | ICD-10-CM | POA: Diagnosis not present

## 2017-05-30 DIAGNOSIS — D649 Anemia, unspecified: Secondary | ICD-10-CM | POA: Diagnosis not present

## 2017-05-30 DIAGNOSIS — R600 Localized edema: Secondary | ICD-10-CM | POA: Diagnosis not present

## 2017-05-30 DIAGNOSIS — C9110 Chronic lymphocytic leukemia of B-cell type not having achieved remission: Secondary | ICD-10-CM | POA: Diagnosis not present

## 2017-05-30 DIAGNOSIS — D696 Thrombocytopenia, unspecified: Secondary | ICD-10-CM | POA: Diagnosis not present

## 2017-06-03 DIAGNOSIS — L0100 Impetigo, unspecified: Secondary | ICD-10-CM | POA: Diagnosis not present

## 2017-06-04 DIAGNOSIS — Z1231 Encounter for screening mammogram for malignant neoplasm of breast: Secondary | ICD-10-CM | POA: Diagnosis not present

## 2017-06-05 DIAGNOSIS — C9110 Chronic lymphocytic leukemia of B-cell type not having achieved remission: Secondary | ICD-10-CM | POA: Diagnosis not present

## 2017-06-05 DIAGNOSIS — D819 Combined immunodeficiency, unspecified: Secondary | ICD-10-CM | POA: Diagnosis not present

## 2017-06-06 DIAGNOSIS — R300 Dysuria: Secondary | ICD-10-CM | POA: Diagnosis not present

## 2017-06-06 DIAGNOSIS — D819 Combined immunodeficiency, unspecified: Secondary | ICD-10-CM | POA: Diagnosis not present

## 2017-06-06 DIAGNOSIS — C9110 Chronic lymphocytic leukemia of B-cell type not having achieved remission: Secondary | ICD-10-CM | POA: Diagnosis not present

## 2017-06-09 DIAGNOSIS — L97222 Non-pressure chronic ulcer of left calf with fat layer exposed: Secondary | ICD-10-CM | POA: Diagnosis not present

## 2017-06-09 DIAGNOSIS — G629 Polyneuropathy, unspecified: Secondary | ICD-10-CM | POA: Diagnosis not present

## 2017-06-09 DIAGNOSIS — K611 Rectal abscess: Secondary | ICD-10-CM | POA: Diagnosis not present

## 2017-06-09 DIAGNOSIS — Z720 Tobacco use: Secondary | ICD-10-CM | POA: Diagnosis not present

## 2017-06-09 DIAGNOSIS — C9110 Chronic lymphocytic leukemia of B-cell type not having achieved remission: Secondary | ICD-10-CM | POA: Diagnosis not present

## 2017-06-09 DIAGNOSIS — Z79891 Long term (current) use of opiate analgesic: Secondary | ICD-10-CM | POA: Diagnosis not present

## 2017-06-09 DIAGNOSIS — L8990 Pressure ulcer of unspecified site, unspecified stage: Secondary | ICD-10-CM | POA: Diagnosis not present

## 2017-06-09 DIAGNOSIS — G894 Chronic pain syndrome: Secondary | ICD-10-CM | POA: Diagnosis not present

## 2017-06-09 DIAGNOSIS — D819 Combined immunodeficiency, unspecified: Secondary | ICD-10-CM | POA: Diagnosis not present

## 2017-06-09 DIAGNOSIS — M170 Bilateral primary osteoarthritis of knee: Secondary | ICD-10-CM | POA: Diagnosis not present

## 2017-06-10 DIAGNOSIS — D485 Neoplasm of uncertain behavior of skin: Secondary | ICD-10-CM | POA: Diagnosis not present

## 2017-06-10 DIAGNOSIS — L98499 Non-pressure chronic ulcer of skin of other sites with unspecified severity: Secondary | ICD-10-CM | POA: Diagnosis not present

## 2017-06-13 DIAGNOSIS — D819 Combined immunodeficiency, unspecified: Secondary | ICD-10-CM | POA: Diagnosis not present

## 2017-06-13 DIAGNOSIS — C9110 Chronic lymphocytic leukemia of B-cell type not having achieved remission: Secondary | ICD-10-CM | POA: Diagnosis not present

## 2017-06-17 DIAGNOSIS — C9110 Chronic lymphocytic leukemia of B-cell type not having achieved remission: Secondary | ICD-10-CM | POA: Diagnosis not present

## 2017-06-17 DIAGNOSIS — D819 Combined immunodeficiency, unspecified: Secondary | ICD-10-CM | POA: Diagnosis not present

## 2017-06-20 DIAGNOSIS — D819 Combined immunodeficiency, unspecified: Secondary | ICD-10-CM | POA: Diagnosis not present

## 2017-06-20 DIAGNOSIS — C9110 Chronic lymphocytic leukemia of B-cell type not having achieved remission: Secondary | ICD-10-CM | POA: Diagnosis not present

## 2017-06-23 DIAGNOSIS — C9110 Chronic lymphocytic leukemia of B-cell type not having achieved remission: Secondary | ICD-10-CM | POA: Diagnosis not present

## 2017-06-23 DIAGNOSIS — D819 Combined immunodeficiency, unspecified: Secondary | ICD-10-CM | POA: Diagnosis not present

## 2017-06-24 DIAGNOSIS — B372 Candidiasis of skin and nail: Secondary | ICD-10-CM | POA: Diagnosis not present

## 2017-06-27 DIAGNOSIS — D61818 Other pancytopenia: Secondary | ICD-10-CM | POA: Diagnosis not present

## 2017-06-27 DIAGNOSIS — C9110 Chronic lymphocytic leukemia of B-cell type not having achieved remission: Secondary | ICD-10-CM | POA: Diagnosis not present

## 2017-06-27 DIAGNOSIS — R161 Splenomegaly, not elsewhere classified: Secondary | ICD-10-CM | POA: Diagnosis not present

## 2017-06-27 DIAGNOSIS — Z0001 Encounter for general adult medical examination with abnormal findings: Secondary | ICD-10-CM | POA: Diagnosis not present

## 2017-06-27 DIAGNOSIS — I499 Cardiac arrhythmia, unspecified: Secondary | ICD-10-CM | POA: Diagnosis not present

## 2017-06-27 DIAGNOSIS — D819 Combined immunodeficiency, unspecified: Secondary | ICD-10-CM | POA: Diagnosis not present

## 2017-06-27 DIAGNOSIS — N189 Chronic kidney disease, unspecified: Secondary | ICD-10-CM | POA: Diagnosis not present

## 2017-07-01 DIAGNOSIS — D819 Combined immunodeficiency, unspecified: Secondary | ICD-10-CM | POA: Diagnosis not present

## 2017-07-01 DIAGNOSIS — C9110 Chronic lymphocytic leukemia of B-cell type not having achieved remission: Secondary | ICD-10-CM | POA: Diagnosis not present

## 2017-07-03 DIAGNOSIS — R252 Cramp and spasm: Secondary | ICD-10-CM | POA: Diagnosis not present

## 2017-07-03 DIAGNOSIS — R600 Localized edema: Secondary | ICD-10-CM | POA: Diagnosis not present

## 2017-07-03 DIAGNOSIS — C9192 Lymphoid leukemia, unspecified, in relapse: Secondary | ICD-10-CM | POA: Diagnosis not present

## 2017-07-03 DIAGNOSIS — I10 Essential (primary) hypertension: Secondary | ICD-10-CM | POA: Diagnosis not present

## 2017-07-03 DIAGNOSIS — E8881 Metabolic syndrome: Secondary | ICD-10-CM | POA: Diagnosis not present

## 2017-07-03 DIAGNOSIS — M810 Age-related osteoporosis without current pathological fracture: Secondary | ICD-10-CM | POA: Diagnosis not present

## 2017-07-03 DIAGNOSIS — R0600 Dyspnea, unspecified: Secondary | ICD-10-CM | POA: Diagnosis not present

## 2017-07-03 DIAGNOSIS — K219 Gastro-esophageal reflux disease without esophagitis: Secondary | ICD-10-CM | POA: Diagnosis not present

## 2017-07-03 DIAGNOSIS — M159 Polyosteoarthritis, unspecified: Secondary | ICD-10-CM | POA: Diagnosis not present

## 2017-07-03 DIAGNOSIS — E781 Pure hyperglyceridemia: Secondary | ICD-10-CM | POA: Diagnosis not present

## 2017-07-03 DIAGNOSIS — J309 Allergic rhinitis, unspecified: Secondary | ICD-10-CM | POA: Diagnosis not present

## 2017-07-04 DIAGNOSIS — Z79899 Other long term (current) drug therapy: Secondary | ICD-10-CM | POA: Diagnosis not present

## 2017-07-04 DIAGNOSIS — C9110 Chronic lymphocytic leukemia of B-cell type not having achieved remission: Secondary | ICD-10-CM | POA: Diagnosis not present

## 2017-07-04 DIAGNOSIS — R161 Splenomegaly, not elsewhere classified: Secondary | ICD-10-CM | POA: Diagnosis not present

## 2017-07-04 DIAGNOSIS — N189 Chronic kidney disease, unspecified: Secondary | ICD-10-CM | POA: Diagnosis not present

## 2017-07-04 DIAGNOSIS — D819 Combined immunodeficiency, unspecified: Secondary | ICD-10-CM | POA: Diagnosis not present

## 2017-07-04 DIAGNOSIS — D61818 Other pancytopenia: Secondary | ICD-10-CM | POA: Diagnosis not present

## 2017-07-07 DIAGNOSIS — C9110 Chronic lymphocytic leukemia of B-cell type not having achieved remission: Secondary | ICD-10-CM | POA: Diagnosis not present

## 2017-07-07 DIAGNOSIS — Z720 Tobacco use: Secondary | ICD-10-CM | POA: Diagnosis not present

## 2017-07-07 DIAGNOSIS — K611 Rectal abscess: Secondary | ICD-10-CM | POA: Diagnosis not present

## 2017-07-07 DIAGNOSIS — D819 Combined immunodeficiency, unspecified: Secondary | ICD-10-CM | POA: Diagnosis not present

## 2017-07-07 DIAGNOSIS — L8990 Pressure ulcer of unspecified site, unspecified stage: Secondary | ICD-10-CM | POA: Diagnosis not present

## 2017-07-07 DIAGNOSIS — G629 Polyneuropathy, unspecified: Secondary | ICD-10-CM | POA: Diagnosis not present

## 2017-07-07 DIAGNOSIS — Z79891 Long term (current) use of opiate analgesic: Secondary | ICD-10-CM | POA: Diagnosis not present

## 2017-07-07 DIAGNOSIS — M170 Bilateral primary osteoarthritis of knee: Secondary | ICD-10-CM | POA: Diagnosis not present

## 2017-07-07 DIAGNOSIS — L97222 Non-pressure chronic ulcer of left calf with fat layer exposed: Secondary | ICD-10-CM | POA: Diagnosis not present

## 2017-07-07 DIAGNOSIS — G894 Chronic pain syndrome: Secondary | ICD-10-CM | POA: Diagnosis not present

## 2017-07-11 DIAGNOSIS — C9110 Chronic lymphocytic leukemia of B-cell type not having achieved remission: Secondary | ICD-10-CM | POA: Diagnosis not present

## 2017-07-15 DIAGNOSIS — B372 Candidiasis of skin and nail: Secondary | ICD-10-CM | POA: Diagnosis not present

## 2017-07-15 DIAGNOSIS — L209 Atopic dermatitis, unspecified: Secondary | ICD-10-CM | POA: Diagnosis not present

## 2017-07-16 DIAGNOSIS — R161 Splenomegaly, not elsewhere classified: Secondary | ICD-10-CM | POA: Diagnosis not present

## 2017-07-16 DIAGNOSIS — E876 Hypokalemia: Secondary | ICD-10-CM | POA: Diagnosis not present

## 2017-07-16 DIAGNOSIS — D61818 Other pancytopenia: Secondary | ICD-10-CM | POA: Diagnosis not present

## 2017-07-16 DIAGNOSIS — N189 Chronic kidney disease, unspecified: Secondary | ICD-10-CM | POA: Diagnosis not present

## 2017-07-16 DIAGNOSIS — D819 Combined immunodeficiency, unspecified: Secondary | ICD-10-CM | POA: Diagnosis not present

## 2017-07-16 DIAGNOSIS — C9110 Chronic lymphocytic leukemia of B-cell type not having achieved remission: Secondary | ICD-10-CM | POA: Diagnosis not present

## 2017-07-25 DIAGNOSIS — C9110 Chronic lymphocytic leukemia of B-cell type not having achieved remission: Secondary | ICD-10-CM | POA: Diagnosis not present

## 2017-07-25 DIAGNOSIS — D819 Combined immunodeficiency, unspecified: Secondary | ICD-10-CM | POA: Diagnosis not present

## 2017-07-30 DIAGNOSIS — R351 Nocturia: Secondary | ICD-10-CM | POA: Diagnosis not present

## 2017-07-30 DIAGNOSIS — N3289 Other specified disorders of bladder: Secondary | ICD-10-CM | POA: Diagnosis not present

## 2017-08-01 DIAGNOSIS — D819 Combined immunodeficiency, unspecified: Secondary | ICD-10-CM | POA: Diagnosis not present

## 2017-08-01 DIAGNOSIS — C9110 Chronic lymphocytic leukemia of B-cell type not having achieved remission: Secondary | ICD-10-CM | POA: Diagnosis not present

## 2017-08-05 DIAGNOSIS — M170 Bilateral primary osteoarthritis of knee: Secondary | ICD-10-CM | POA: Diagnosis not present

## 2017-08-05 DIAGNOSIS — Z79891 Long term (current) use of opiate analgesic: Secondary | ICD-10-CM | POA: Diagnosis not present

## 2017-08-05 DIAGNOSIS — G894 Chronic pain syndrome: Secondary | ICD-10-CM | POA: Diagnosis not present

## 2017-08-05 DIAGNOSIS — L8990 Pressure ulcer of unspecified site, unspecified stage: Secondary | ICD-10-CM | POA: Diagnosis not present

## 2017-08-05 DIAGNOSIS — G629 Polyneuropathy, unspecified: Secondary | ICD-10-CM | POA: Diagnosis not present

## 2017-08-05 DIAGNOSIS — L97222 Non-pressure chronic ulcer of left calf with fat layer exposed: Secondary | ICD-10-CM | POA: Diagnosis not present

## 2017-08-05 DIAGNOSIS — K611 Rectal abscess: Secondary | ICD-10-CM | POA: Diagnosis not present

## 2017-08-05 DIAGNOSIS — Z720 Tobacco use: Secondary | ICD-10-CM | POA: Diagnosis not present

## 2017-08-08 DIAGNOSIS — C9110 Chronic lymphocytic leukemia of B-cell type not having achieved remission: Secondary | ICD-10-CM | POA: Diagnosis not present

## 2017-08-08 DIAGNOSIS — D819 Combined immunodeficiency, unspecified: Secondary | ICD-10-CM | POA: Diagnosis not present

## 2017-08-15 DIAGNOSIS — C9110 Chronic lymphocytic leukemia of B-cell type not having achieved remission: Secondary | ICD-10-CM | POA: Diagnosis not present

## 2017-08-22 DIAGNOSIS — D819 Combined immunodeficiency, unspecified: Secondary | ICD-10-CM | POA: Diagnosis not present

## 2017-08-22 DIAGNOSIS — C9110 Chronic lymphocytic leukemia of B-cell type not having achieved remission: Secondary | ICD-10-CM | POA: Diagnosis not present

## 2017-08-29 DIAGNOSIS — D819 Combined immunodeficiency, unspecified: Secondary | ICD-10-CM | POA: Diagnosis not present

## 2017-08-29 DIAGNOSIS — C9110 Chronic lymphocytic leukemia of B-cell type not having achieved remission: Secondary | ICD-10-CM | POA: Diagnosis not present

## 2017-09-02 DIAGNOSIS — M159 Polyosteoarthritis, unspecified: Secondary | ICD-10-CM | POA: Diagnosis not present

## 2017-09-02 DIAGNOSIS — M810 Age-related osteoporosis without current pathological fracture: Secondary | ICD-10-CM | POA: Diagnosis not present

## 2017-09-02 DIAGNOSIS — Z720 Tobacco use: Secondary | ICD-10-CM | POA: Diagnosis not present

## 2017-09-02 DIAGNOSIS — K219 Gastro-esophageal reflux disease without esophagitis: Secondary | ICD-10-CM | POA: Diagnosis not present

## 2017-09-02 DIAGNOSIS — R600 Localized edema: Secondary | ICD-10-CM | POA: Diagnosis not present

## 2017-09-02 DIAGNOSIS — E781 Pure hyperglyceridemia: Secondary | ICD-10-CM | POA: Diagnosis not present

## 2017-09-04 DIAGNOSIS — L97222 Non-pressure chronic ulcer of left calf with fat layer exposed: Secondary | ICD-10-CM | POA: Diagnosis not present

## 2017-09-04 DIAGNOSIS — G894 Chronic pain syndrome: Secondary | ICD-10-CM | POA: Diagnosis not present

## 2017-09-04 DIAGNOSIS — C8590 Non-Hodgkin lymphoma, unspecified, unspecified site: Secondary | ICD-10-CM | POA: Diagnosis not present

## 2017-09-04 DIAGNOSIS — J449 Chronic obstructive pulmonary disease, unspecified: Secondary | ICD-10-CM | POA: Diagnosis not present

## 2017-09-04 DIAGNOSIS — Z720 Tobacco use: Secondary | ICD-10-CM | POA: Diagnosis not present

## 2017-09-04 DIAGNOSIS — L8990 Pressure ulcer of unspecified site, unspecified stage: Secondary | ICD-10-CM | POA: Diagnosis not present

## 2017-09-04 DIAGNOSIS — M170 Bilateral primary osteoarthritis of knee: Secondary | ICD-10-CM | POA: Diagnosis not present

## 2017-09-04 DIAGNOSIS — Z79891 Long term (current) use of opiate analgesic: Secondary | ICD-10-CM | POA: Diagnosis not present

## 2017-09-04 DIAGNOSIS — K611 Rectal abscess: Secondary | ICD-10-CM | POA: Diagnosis not present

## 2017-09-04 DIAGNOSIS — G629 Polyneuropathy, unspecified: Secondary | ICD-10-CM | POA: Diagnosis not present

## 2017-09-12 DIAGNOSIS — C9110 Chronic lymphocytic leukemia of B-cell type not having achieved remission: Secondary | ICD-10-CM | POA: Diagnosis not present

## 2017-09-12 DIAGNOSIS — D819 Combined immunodeficiency, unspecified: Secondary | ICD-10-CM | POA: Diagnosis not present

## 2017-09-16 DIAGNOSIS — R351 Nocturia: Secondary | ICD-10-CM | POA: Diagnosis not present

## 2017-09-16 DIAGNOSIS — N3289 Other specified disorders of bladder: Secondary | ICD-10-CM | POA: Diagnosis not present

## 2017-09-26 DIAGNOSIS — D819 Combined immunodeficiency, unspecified: Secondary | ICD-10-CM | POA: Diagnosis not present

## 2017-09-26 DIAGNOSIS — C9110 Chronic lymphocytic leukemia of B-cell type not having achieved remission: Secondary | ICD-10-CM | POA: Diagnosis not present

## 2017-09-29 DIAGNOSIS — L97222 Non-pressure chronic ulcer of left calf with fat layer exposed: Secondary | ICD-10-CM | POA: Diagnosis not present

## 2017-09-29 DIAGNOSIS — M170 Bilateral primary osteoarthritis of knee: Secondary | ICD-10-CM | POA: Diagnosis not present

## 2017-09-29 DIAGNOSIS — K611 Rectal abscess: Secondary | ICD-10-CM | POA: Diagnosis not present

## 2017-09-29 DIAGNOSIS — G8929 Other chronic pain: Secondary | ICD-10-CM | POA: Diagnosis not present

## 2017-09-29 DIAGNOSIS — G629 Polyneuropathy, unspecified: Secondary | ICD-10-CM | POA: Diagnosis not present

## 2017-09-29 DIAGNOSIS — Z720 Tobacco use: Secondary | ICD-10-CM | POA: Diagnosis not present

## 2017-09-29 DIAGNOSIS — L8990 Pressure ulcer of unspecified site, unspecified stage: Secondary | ICD-10-CM | POA: Diagnosis not present

## 2017-09-29 DIAGNOSIS — G894 Chronic pain syndrome: Secondary | ICD-10-CM | POA: Diagnosis not present

## 2017-09-29 DIAGNOSIS — Z79891 Long term (current) use of opiate analgesic: Secondary | ICD-10-CM | POA: Diagnosis not present

## 2017-09-29 DIAGNOSIS — J449 Chronic obstructive pulmonary disease, unspecified: Secondary | ICD-10-CM | POA: Diagnosis not present

## 2017-09-29 DIAGNOSIS — C8590 Non-Hodgkin lymphoma, unspecified, unspecified site: Secondary | ICD-10-CM | POA: Diagnosis not present

## 2017-10-24 DIAGNOSIS — C9110 Chronic lymphocytic leukemia of B-cell type not having achieved remission: Secondary | ICD-10-CM | POA: Diagnosis not present

## 2017-10-24 DIAGNOSIS — D819 Combined immunodeficiency, unspecified: Secondary | ICD-10-CM | POA: Diagnosis not present

## 2017-10-29 DIAGNOSIS — R351 Nocturia: Secondary | ICD-10-CM | POA: Diagnosis not present

## 2017-10-29 DIAGNOSIS — N3289 Other specified disorders of bladder: Secondary | ICD-10-CM | POA: Diagnosis not present

## 2017-11-06 DIAGNOSIS — C8590 Non-Hodgkin lymphoma, unspecified, unspecified site: Secondary | ICD-10-CM | POA: Diagnosis not present

## 2017-11-06 DIAGNOSIS — J449 Chronic obstructive pulmonary disease, unspecified: Secondary | ICD-10-CM | POA: Diagnosis not present

## 2017-11-06 DIAGNOSIS — Z79891 Long term (current) use of opiate analgesic: Secondary | ICD-10-CM | POA: Diagnosis not present

## 2017-11-06 DIAGNOSIS — K611 Rectal abscess: Secondary | ICD-10-CM | POA: Diagnosis not present

## 2017-11-06 DIAGNOSIS — M170 Bilateral primary osteoarthritis of knee: Secondary | ICD-10-CM | POA: Diagnosis not present

## 2017-11-06 DIAGNOSIS — G894 Chronic pain syndrome: Secondary | ICD-10-CM | POA: Diagnosis not present

## 2017-11-06 DIAGNOSIS — Z720 Tobacco use: Secondary | ICD-10-CM | POA: Diagnosis not present

## 2017-11-06 DIAGNOSIS — L8990 Pressure ulcer of unspecified site, unspecified stage: Secondary | ICD-10-CM | POA: Diagnosis not present

## 2017-11-06 DIAGNOSIS — L97222 Non-pressure chronic ulcer of left calf with fat layer exposed: Secondary | ICD-10-CM | POA: Diagnosis not present

## 2017-11-06 DIAGNOSIS — G629 Polyneuropathy, unspecified: Secondary | ICD-10-CM | POA: Diagnosis not present

## 2017-11-21 DIAGNOSIS — K521 Toxic gastroenteritis and colitis: Secondary | ICD-10-CM | POA: Diagnosis not present

## 2017-11-21 DIAGNOSIS — Z515 Encounter for palliative care: Secondary | ICD-10-CM | POA: Diagnosis not present

## 2017-11-21 DIAGNOSIS — D819 Combined immunodeficiency, unspecified: Secondary | ICD-10-CM | POA: Diagnosis not present

## 2017-11-21 DIAGNOSIS — R197 Diarrhea, unspecified: Secondary | ICD-10-CM | POA: Diagnosis not present

## 2017-11-21 DIAGNOSIS — C9110 Chronic lymphocytic leukemia of B-cell type not having achieved remission: Secondary | ICD-10-CM | POA: Diagnosis not present

## 2017-11-21 DIAGNOSIS — R161 Splenomegaly, not elsewhere classified: Secondary | ICD-10-CM | POA: Diagnosis not present

## 2017-11-21 DIAGNOSIS — D61818 Other pancytopenia: Secondary | ICD-10-CM | POA: Diagnosis not present

## 2017-12-03 DIAGNOSIS — Z1339 Encounter for screening examination for other mental health and behavioral disorders: Secondary | ICD-10-CM | POA: Diagnosis not present

## 2017-12-03 DIAGNOSIS — M159 Polyosteoarthritis, unspecified: Secondary | ICD-10-CM | POA: Diagnosis not present

## 2017-12-03 DIAGNOSIS — K219 Gastro-esophageal reflux disease without esophagitis: Secondary | ICD-10-CM | POA: Diagnosis not present

## 2017-12-03 DIAGNOSIS — Z720 Tobacco use: Secondary | ICD-10-CM | POA: Diagnosis not present

## 2017-12-03 DIAGNOSIS — M810 Age-related osteoporosis without current pathological fracture: Secondary | ICD-10-CM | POA: Diagnosis not present

## 2017-12-03 DIAGNOSIS — R600 Localized edema: Secondary | ICD-10-CM | POA: Diagnosis not present

## 2017-12-03 DIAGNOSIS — I10 Essential (primary) hypertension: Secondary | ICD-10-CM | POA: Diagnosis not present

## 2017-12-03 DIAGNOSIS — E781 Pure hyperglyceridemia: Secondary | ICD-10-CM | POA: Diagnosis not present

## 2017-12-10 DIAGNOSIS — K611 Rectal abscess: Secondary | ICD-10-CM | POA: Diagnosis not present

## 2017-12-10 DIAGNOSIS — L97222 Non-pressure chronic ulcer of left calf with fat layer exposed: Secondary | ICD-10-CM | POA: Diagnosis not present

## 2017-12-10 DIAGNOSIS — M170 Bilateral primary osteoarthritis of knee: Secondary | ICD-10-CM | POA: Diagnosis not present

## 2017-12-10 DIAGNOSIS — J449 Chronic obstructive pulmonary disease, unspecified: Secondary | ICD-10-CM | POA: Diagnosis not present

## 2017-12-10 DIAGNOSIS — C8590 Non-Hodgkin lymphoma, unspecified, unspecified site: Secondary | ICD-10-CM | POA: Diagnosis not present

## 2017-12-10 DIAGNOSIS — G894 Chronic pain syndrome: Secondary | ICD-10-CM | POA: Diagnosis not present

## 2017-12-10 DIAGNOSIS — Z79891 Long term (current) use of opiate analgesic: Secondary | ICD-10-CM | POA: Diagnosis not present

## 2017-12-10 DIAGNOSIS — Z720 Tobacco use: Secondary | ICD-10-CM | POA: Diagnosis not present

## 2017-12-10 DIAGNOSIS — G629 Polyneuropathy, unspecified: Secondary | ICD-10-CM | POA: Diagnosis not present

## 2017-12-10 DIAGNOSIS — L8990 Pressure ulcer of unspecified site, unspecified stage: Secondary | ICD-10-CM | POA: Diagnosis not present

## 2017-12-19 DIAGNOSIS — E876 Hypokalemia: Secondary | ICD-10-CM | POA: Diagnosis not present

## 2017-12-19 DIAGNOSIS — Z23 Encounter for immunization: Secondary | ICD-10-CM | POA: Diagnosis not present

## 2017-12-19 DIAGNOSIS — R197 Diarrhea, unspecified: Secondary | ICD-10-CM | POA: Diagnosis not present

## 2017-12-19 DIAGNOSIS — D819 Combined immunodeficiency, unspecified: Secondary | ICD-10-CM | POA: Diagnosis not present

## 2017-12-19 DIAGNOSIS — C9110 Chronic lymphocytic leukemia of B-cell type not having achieved remission: Secondary | ICD-10-CM | POA: Diagnosis not present

## 2018-01-08 DIAGNOSIS — Z720 Tobacco use: Secondary | ICD-10-CM | POA: Diagnosis not present

## 2018-01-08 DIAGNOSIS — J449 Chronic obstructive pulmonary disease, unspecified: Secondary | ICD-10-CM | POA: Diagnosis not present

## 2018-01-08 DIAGNOSIS — G629 Polyneuropathy, unspecified: Secondary | ICD-10-CM | POA: Diagnosis not present

## 2018-01-08 DIAGNOSIS — G894 Chronic pain syndrome: Secondary | ICD-10-CM | POA: Diagnosis not present

## 2018-01-08 DIAGNOSIS — L8990 Pressure ulcer of unspecified site, unspecified stage: Secondary | ICD-10-CM | POA: Diagnosis not present

## 2018-01-08 DIAGNOSIS — M170 Bilateral primary osteoarthritis of knee: Secondary | ICD-10-CM | POA: Diagnosis not present

## 2018-01-08 DIAGNOSIS — L97222 Non-pressure chronic ulcer of left calf with fat layer exposed: Secondary | ICD-10-CM | POA: Diagnosis not present

## 2018-01-08 DIAGNOSIS — C8590 Non-Hodgkin lymphoma, unspecified, unspecified site: Secondary | ICD-10-CM | POA: Diagnosis not present

## 2018-01-08 DIAGNOSIS — K611 Rectal abscess: Secondary | ICD-10-CM | POA: Diagnosis not present

## 2018-01-08 DIAGNOSIS — Z79891 Long term (current) use of opiate analgesic: Secondary | ICD-10-CM | POA: Diagnosis not present

## 2018-01-16 DIAGNOSIS — D819 Combined immunodeficiency, unspecified: Secondary | ICD-10-CM | POA: Diagnosis not present

## 2018-01-16 DIAGNOSIS — C9110 Chronic lymphocytic leukemia of B-cell type not having achieved remission: Secondary | ICD-10-CM | POA: Diagnosis not present

## 2018-01-16 DIAGNOSIS — Z79899 Other long term (current) drug therapy: Secondary | ICD-10-CM | POA: Diagnosis not present

## 2018-01-29 DIAGNOSIS — N3289 Other specified disorders of bladder: Secondary | ICD-10-CM | POA: Diagnosis not present

## 2018-01-29 DIAGNOSIS — R351 Nocturia: Secondary | ICD-10-CM | POA: Diagnosis not present

## 2018-02-09 DIAGNOSIS — L97222 Non-pressure chronic ulcer of left calf with fat layer exposed: Secondary | ICD-10-CM | POA: Diagnosis not present

## 2018-02-09 DIAGNOSIS — Z720 Tobacco use: Secondary | ICD-10-CM | POA: Diagnosis not present

## 2018-02-09 DIAGNOSIS — G629 Polyneuropathy, unspecified: Secondary | ICD-10-CM | POA: Diagnosis not present

## 2018-02-09 DIAGNOSIS — K611 Rectal abscess: Secondary | ICD-10-CM | POA: Diagnosis not present

## 2018-02-09 DIAGNOSIS — C9110 Chronic lymphocytic leukemia of B-cell type not having achieved remission: Secondary | ICD-10-CM | POA: Diagnosis not present

## 2018-02-09 DIAGNOSIS — J449 Chronic obstructive pulmonary disease, unspecified: Secondary | ICD-10-CM | POA: Diagnosis not present

## 2018-02-09 DIAGNOSIS — C8590 Non-Hodgkin lymphoma, unspecified, unspecified site: Secondary | ICD-10-CM | POA: Diagnosis not present

## 2018-02-09 DIAGNOSIS — L8990 Pressure ulcer of unspecified site, unspecified stage: Secondary | ICD-10-CM | POA: Diagnosis not present

## 2018-02-09 DIAGNOSIS — G894 Chronic pain syndrome: Secondary | ICD-10-CM | POA: Diagnosis not present

## 2018-02-09 DIAGNOSIS — M170 Bilateral primary osteoarthritis of knee: Secondary | ICD-10-CM | POA: Diagnosis not present

## 2018-02-09 DIAGNOSIS — Z79891 Long term (current) use of opiate analgesic: Secondary | ICD-10-CM | POA: Diagnosis not present

## 2018-02-13 DIAGNOSIS — D819 Combined immunodeficiency, unspecified: Secondary | ICD-10-CM | POA: Diagnosis not present

## 2018-02-13 DIAGNOSIS — C9110 Chronic lymphocytic leukemia of B-cell type not having achieved remission: Secondary | ICD-10-CM | POA: Diagnosis not present

## 2018-03-09 DIAGNOSIS — I10 Essential (primary) hypertension: Secondary | ICD-10-CM | POA: Diagnosis not present

## 2018-03-09 DIAGNOSIS — Z720 Tobacco use: Secondary | ICD-10-CM | POA: Diagnosis not present

## 2018-03-09 DIAGNOSIS — K219 Gastro-esophageal reflux disease without esophagitis: Secondary | ICD-10-CM | POA: Diagnosis not present

## 2018-03-09 DIAGNOSIS — R600 Localized edema: Secondary | ICD-10-CM | POA: Diagnosis not present

## 2018-03-09 DIAGNOSIS — E781 Pure hyperglyceridemia: Secondary | ICD-10-CM | POA: Diagnosis not present

## 2018-03-09 DIAGNOSIS — M159 Polyosteoarthritis, unspecified: Secondary | ICD-10-CM | POA: Diagnosis not present

## 2018-03-10 DIAGNOSIS — J449 Chronic obstructive pulmonary disease, unspecified: Secondary | ICD-10-CM | POA: Diagnosis not present

## 2018-03-10 DIAGNOSIS — C8590 Non-Hodgkin lymphoma, unspecified, unspecified site: Secondary | ICD-10-CM | POA: Diagnosis not present

## 2018-03-10 DIAGNOSIS — K611 Rectal abscess: Secondary | ICD-10-CM | POA: Diagnosis not present

## 2018-03-10 DIAGNOSIS — Z79891 Long term (current) use of opiate analgesic: Secondary | ICD-10-CM | POA: Diagnosis not present

## 2018-03-10 DIAGNOSIS — G894 Chronic pain syndrome: Secondary | ICD-10-CM | POA: Diagnosis not present

## 2018-03-10 DIAGNOSIS — M170 Bilateral primary osteoarthritis of knee: Secondary | ICD-10-CM | POA: Diagnosis not present

## 2018-03-10 DIAGNOSIS — L97222 Non-pressure chronic ulcer of left calf with fat layer exposed: Secondary | ICD-10-CM | POA: Diagnosis not present

## 2018-03-10 DIAGNOSIS — Z720 Tobacco use: Secondary | ICD-10-CM | POA: Diagnosis not present

## 2018-03-10 DIAGNOSIS — G629 Polyneuropathy, unspecified: Secondary | ICD-10-CM | POA: Diagnosis not present

## 2018-03-10 DIAGNOSIS — L8990 Pressure ulcer of unspecified site, unspecified stage: Secondary | ICD-10-CM | POA: Diagnosis not present

## 2018-03-13 DIAGNOSIS — D819 Combined immunodeficiency, unspecified: Secondary | ICD-10-CM | POA: Diagnosis not present

## 2018-03-13 DIAGNOSIS — Z79899 Other long term (current) drug therapy: Secondary | ICD-10-CM | POA: Diagnosis not present

## 2018-03-13 DIAGNOSIS — C9110 Chronic lymphocytic leukemia of B-cell type not having achieved remission: Secondary | ICD-10-CM | POA: Diagnosis not present

## 2018-04-10 DIAGNOSIS — C9110 Chronic lymphocytic leukemia of B-cell type not having achieved remission: Secondary | ICD-10-CM | POA: Diagnosis not present

## 2018-04-10 DIAGNOSIS — D61818 Other pancytopenia: Secondary | ICD-10-CM | POA: Diagnosis not present

## 2018-04-10 DIAGNOSIS — D819 Combined immunodeficiency, unspecified: Secondary | ICD-10-CM | POA: Diagnosis not present

## 2018-04-10 DIAGNOSIS — D801 Nonfamilial hypogammaglobulinemia: Secondary | ICD-10-CM | POA: Diagnosis not present

## 2018-04-14 DIAGNOSIS — Z79891 Long term (current) use of opiate analgesic: Secondary | ICD-10-CM | POA: Diagnosis not present

## 2018-04-14 DIAGNOSIS — C8590 Non-Hodgkin lymphoma, unspecified, unspecified site: Secondary | ICD-10-CM | POA: Diagnosis not present

## 2018-04-14 DIAGNOSIS — G894 Chronic pain syndrome: Secondary | ICD-10-CM | POA: Diagnosis not present

## 2018-04-14 DIAGNOSIS — L97222 Non-pressure chronic ulcer of left calf with fat layer exposed: Secondary | ICD-10-CM | POA: Diagnosis not present

## 2018-04-14 DIAGNOSIS — Z720 Tobacco use: Secondary | ICD-10-CM | POA: Diagnosis not present

## 2018-04-14 DIAGNOSIS — M170 Bilateral primary osteoarthritis of knee: Secondary | ICD-10-CM | POA: Diagnosis not present

## 2018-04-14 DIAGNOSIS — L8990 Pressure ulcer of unspecified site, unspecified stage: Secondary | ICD-10-CM | POA: Diagnosis not present

## 2018-04-14 DIAGNOSIS — K611 Rectal abscess: Secondary | ICD-10-CM | POA: Diagnosis not present

## 2018-04-14 DIAGNOSIS — G629 Polyneuropathy, unspecified: Secondary | ICD-10-CM | POA: Diagnosis not present

## 2018-04-14 DIAGNOSIS — J449 Chronic obstructive pulmonary disease, unspecified: Secondary | ICD-10-CM | POA: Diagnosis not present

## 2018-05-08 DIAGNOSIS — C9110 Chronic lymphocytic leukemia of B-cell type not having achieved remission: Secondary | ICD-10-CM | POA: Diagnosis not present

## 2018-05-08 DIAGNOSIS — D819 Combined immunodeficiency, unspecified: Secondary | ICD-10-CM | POA: Diagnosis not present

## 2018-05-08 DIAGNOSIS — D61818 Other pancytopenia: Secondary | ICD-10-CM | POA: Diagnosis not present

## 2018-05-12 DIAGNOSIS — C8590 Non-Hodgkin lymphoma, unspecified, unspecified site: Secondary | ICD-10-CM | POA: Diagnosis not present

## 2018-05-12 DIAGNOSIS — L97222 Non-pressure chronic ulcer of left calf with fat layer exposed: Secondary | ICD-10-CM | POA: Diagnosis not present

## 2018-05-12 DIAGNOSIS — K611 Rectal abscess: Secondary | ICD-10-CM | POA: Diagnosis not present

## 2018-05-12 DIAGNOSIS — G894 Chronic pain syndrome: Secondary | ICD-10-CM | POA: Diagnosis not present

## 2018-05-12 DIAGNOSIS — G629 Polyneuropathy, unspecified: Secondary | ICD-10-CM | POA: Diagnosis not present

## 2018-05-12 DIAGNOSIS — M170 Bilateral primary osteoarthritis of knee: Secondary | ICD-10-CM | POA: Diagnosis not present

## 2018-05-12 DIAGNOSIS — J449 Chronic obstructive pulmonary disease, unspecified: Secondary | ICD-10-CM | POA: Diagnosis not present

## 2018-05-12 DIAGNOSIS — Z79891 Long term (current) use of opiate analgesic: Secondary | ICD-10-CM | POA: Diagnosis not present

## 2018-05-12 DIAGNOSIS — Z720 Tobacco use: Secondary | ICD-10-CM | POA: Diagnosis not present

## 2018-05-12 DIAGNOSIS — L8990 Pressure ulcer of unspecified site, unspecified stage: Secondary | ICD-10-CM | POA: Diagnosis not present

## 2018-06-05 DIAGNOSIS — D819 Combined immunodeficiency, unspecified: Secondary | ICD-10-CM | POA: Diagnosis not present

## 2018-06-05 DIAGNOSIS — C9110 Chronic lymphocytic leukemia of B-cell type not having achieved remission: Secondary | ICD-10-CM | POA: Diagnosis not present

## 2018-06-09 DIAGNOSIS — L8990 Pressure ulcer of unspecified site, unspecified stage: Secondary | ICD-10-CM | POA: Diagnosis not present

## 2018-06-09 DIAGNOSIS — M170 Bilateral primary osteoarthritis of knee: Secondary | ICD-10-CM | POA: Diagnosis not present

## 2018-06-09 DIAGNOSIS — C8590 Non-Hodgkin lymphoma, unspecified, unspecified site: Secondary | ICD-10-CM | POA: Diagnosis not present

## 2018-06-09 DIAGNOSIS — L97222 Non-pressure chronic ulcer of left calf with fat layer exposed: Secondary | ICD-10-CM | POA: Diagnosis not present

## 2018-06-09 DIAGNOSIS — Z720 Tobacco use: Secondary | ICD-10-CM | POA: Diagnosis not present

## 2018-06-09 DIAGNOSIS — G629 Polyneuropathy, unspecified: Secondary | ICD-10-CM | POA: Diagnosis not present

## 2018-06-09 DIAGNOSIS — J449 Chronic obstructive pulmonary disease, unspecified: Secondary | ICD-10-CM | POA: Diagnosis not present

## 2018-06-09 DIAGNOSIS — K611 Rectal abscess: Secondary | ICD-10-CM | POA: Diagnosis not present

## 2018-06-09 DIAGNOSIS — Z79891 Long term (current) use of opiate analgesic: Secondary | ICD-10-CM | POA: Diagnosis not present

## 2018-06-09 DIAGNOSIS — G894 Chronic pain syndrome: Secondary | ICD-10-CM | POA: Diagnosis not present

## 2018-06-10 DIAGNOSIS — E781 Pure hyperglyceridemia: Secondary | ICD-10-CM | POA: Diagnosis not present

## 2018-06-10 DIAGNOSIS — M159 Polyosteoarthritis, unspecified: Secondary | ICD-10-CM | POA: Diagnosis not present

## 2018-06-10 DIAGNOSIS — R600 Localized edema: Secondary | ICD-10-CM | POA: Diagnosis not present

## 2018-06-10 DIAGNOSIS — Z720 Tobacco use: Secondary | ICD-10-CM | POA: Diagnosis not present

## 2018-06-10 DIAGNOSIS — K219 Gastro-esophageal reflux disease without esophagitis: Secondary | ICD-10-CM | POA: Diagnosis not present

## 2018-06-10 DIAGNOSIS — C9192 Lymphoid leukemia, unspecified, in relapse: Secondary | ICD-10-CM | POA: Diagnosis not present

## 2018-06-10 DIAGNOSIS — I10 Essential (primary) hypertension: Secondary | ICD-10-CM | POA: Diagnosis not present

## 2018-06-16 DIAGNOSIS — Z1231 Encounter for screening mammogram for malignant neoplasm of breast: Secondary | ICD-10-CM | POA: Diagnosis not present

## 2018-07-03 DIAGNOSIS — C9110 Chronic lymphocytic leukemia of B-cell type not having achieved remission: Secondary | ICD-10-CM | POA: Diagnosis not present

## 2018-07-03 DIAGNOSIS — D819 Combined immunodeficiency, unspecified: Secondary | ICD-10-CM | POA: Diagnosis not present

## 2018-07-07 DIAGNOSIS — Z96653 Presence of artificial knee joint, bilateral: Secondary | ICD-10-CM | POA: Diagnosis not present

## 2018-07-07 DIAGNOSIS — M170 Bilateral primary osteoarthritis of knee: Secondary | ICD-10-CM | POA: Diagnosis not present

## 2018-07-07 DIAGNOSIS — C8590 Non-Hodgkin lymphoma, unspecified, unspecified site: Secondary | ICD-10-CM | POA: Diagnosis not present

## 2018-07-07 DIAGNOSIS — L97222 Non-pressure chronic ulcer of left calf with fat layer exposed: Secondary | ICD-10-CM | POA: Diagnosis not present

## 2018-07-07 DIAGNOSIS — Z79891 Long term (current) use of opiate analgesic: Secondary | ICD-10-CM | POA: Diagnosis not present

## 2018-07-07 DIAGNOSIS — L8990 Pressure ulcer of unspecified site, unspecified stage: Secondary | ICD-10-CM | POA: Diagnosis not present

## 2018-07-07 DIAGNOSIS — J449 Chronic obstructive pulmonary disease, unspecified: Secondary | ICD-10-CM | POA: Diagnosis not present

## 2018-07-07 DIAGNOSIS — G629 Polyneuropathy, unspecified: Secondary | ICD-10-CM | POA: Diagnosis not present

## 2018-07-07 DIAGNOSIS — Z720 Tobacco use: Secondary | ICD-10-CM | POA: Diagnosis not present

## 2018-07-07 DIAGNOSIS — K611 Rectal abscess: Secondary | ICD-10-CM | POA: Diagnosis not present

## 2018-07-07 DIAGNOSIS — G894 Chronic pain syndrome: Secondary | ICD-10-CM | POA: Diagnosis not present

## 2018-07-29 DIAGNOSIS — R351 Nocturia: Secondary | ICD-10-CM | POA: Diagnosis not present

## 2018-07-29 DIAGNOSIS — N3289 Other specified disorders of bladder: Secondary | ICD-10-CM | POA: Diagnosis not present

## 2018-07-31 DIAGNOSIS — D819 Combined immunodeficiency, unspecified: Secondary | ICD-10-CM | POA: Diagnosis not present

## 2018-07-31 DIAGNOSIS — C9110 Chronic lymphocytic leukemia of B-cell type not having achieved remission: Secondary | ICD-10-CM

## 2018-08-06 DIAGNOSIS — G629 Polyneuropathy, unspecified: Secondary | ICD-10-CM | POA: Diagnosis not present

## 2018-08-06 DIAGNOSIS — J449 Chronic obstructive pulmonary disease, unspecified: Secondary | ICD-10-CM | POA: Diagnosis not present

## 2018-08-06 DIAGNOSIS — L8990 Pressure ulcer of unspecified site, unspecified stage: Secondary | ICD-10-CM | POA: Diagnosis not present

## 2018-08-06 DIAGNOSIS — Z720 Tobacco use: Secondary | ICD-10-CM | POA: Diagnosis not present

## 2018-08-06 DIAGNOSIS — Z79891 Long term (current) use of opiate analgesic: Secondary | ICD-10-CM | POA: Diagnosis not present

## 2018-08-06 DIAGNOSIS — M170 Bilateral primary osteoarthritis of knee: Secondary | ICD-10-CM | POA: Diagnosis not present

## 2018-08-06 DIAGNOSIS — L97222 Non-pressure chronic ulcer of left calf with fat layer exposed: Secondary | ICD-10-CM | POA: Diagnosis not present

## 2018-08-06 DIAGNOSIS — Z96653 Presence of artificial knee joint, bilateral: Secondary | ICD-10-CM | POA: Diagnosis not present

## 2018-08-06 DIAGNOSIS — C8590 Non-Hodgkin lymphoma, unspecified, unspecified site: Secondary | ICD-10-CM | POA: Diagnosis not present

## 2018-08-06 DIAGNOSIS — G894 Chronic pain syndrome: Secondary | ICD-10-CM | POA: Diagnosis not present

## 2018-08-06 DIAGNOSIS — K611 Rectal abscess: Secondary | ICD-10-CM | POA: Diagnosis not present

## 2018-08-28 DIAGNOSIS — D819 Combined immunodeficiency, unspecified: Secondary | ICD-10-CM | POA: Diagnosis not present

## 2018-08-28 DIAGNOSIS — C9110 Chronic lymphocytic leukemia of B-cell type not having achieved remission: Secondary | ICD-10-CM | POA: Diagnosis not present

## 2018-09-10 DIAGNOSIS — G894 Chronic pain syndrome: Secondary | ICD-10-CM | POA: Diagnosis not present

## 2018-09-10 DIAGNOSIS — L97222 Non-pressure chronic ulcer of left calf with fat layer exposed: Secondary | ICD-10-CM | POA: Diagnosis not present

## 2018-09-10 DIAGNOSIS — G629 Polyneuropathy, unspecified: Secondary | ICD-10-CM | POA: Diagnosis not present

## 2018-09-10 DIAGNOSIS — J439 Emphysema, unspecified: Secondary | ICD-10-CM | POA: Diagnosis not present

## 2018-09-10 DIAGNOSIS — L8990 Pressure ulcer of unspecified site, unspecified stage: Secondary | ICD-10-CM | POA: Diagnosis not present

## 2018-09-10 DIAGNOSIS — Z720 Tobacco use: Secondary | ICD-10-CM | POA: Diagnosis not present

## 2018-09-10 DIAGNOSIS — C9110 Chronic lymphocytic leukemia of B-cell type not having achieved remission: Secondary | ICD-10-CM | POA: Diagnosis not present

## 2018-09-10 DIAGNOSIS — Z79891 Long term (current) use of opiate analgesic: Secondary | ICD-10-CM | POA: Diagnosis not present

## 2018-09-10 DIAGNOSIS — M170 Bilateral primary osteoarthritis of knee: Secondary | ICD-10-CM | POA: Diagnosis not present

## 2018-09-10 DIAGNOSIS — M792 Neuralgia and neuritis, unspecified: Secondary | ICD-10-CM | POA: Diagnosis not present

## 2018-09-10 DIAGNOSIS — K611 Rectal abscess: Secondary | ICD-10-CM | POA: Diagnosis not present

## 2018-09-10 DIAGNOSIS — Z96653 Presence of artificial knee joint, bilateral: Secondary | ICD-10-CM | POA: Diagnosis not present

## 2018-09-11 DIAGNOSIS — R600 Localized edema: Secondary | ICD-10-CM | POA: Diagnosis not present

## 2018-09-11 DIAGNOSIS — Z1331 Encounter for screening for depression: Secondary | ICD-10-CM | POA: Diagnosis not present

## 2018-09-11 DIAGNOSIS — K219 Gastro-esophageal reflux disease without esophagitis: Secondary | ICD-10-CM | POA: Diagnosis not present

## 2018-09-11 DIAGNOSIS — Z9181 History of falling: Secondary | ICD-10-CM | POA: Diagnosis not present

## 2018-09-11 DIAGNOSIS — Z720 Tobacco use: Secondary | ICD-10-CM | POA: Diagnosis not present

## 2018-09-11 DIAGNOSIS — M159 Polyosteoarthritis, unspecified: Secondary | ICD-10-CM | POA: Diagnosis not present

## 2018-09-11 DIAGNOSIS — C9192 Lymphoid leukemia, unspecified, in relapse: Secondary | ICD-10-CM | POA: Diagnosis not present

## 2018-09-25 DIAGNOSIS — D819 Combined immunodeficiency, unspecified: Secondary | ICD-10-CM | POA: Diagnosis not present

## 2018-09-25 DIAGNOSIS — C9110 Chronic lymphocytic leukemia of B-cell type not having achieved remission: Secondary | ICD-10-CM | POA: Diagnosis not present

## 2018-10-14 DIAGNOSIS — M159 Polyosteoarthritis, unspecified: Secondary | ICD-10-CM | POA: Diagnosis not present

## 2018-10-14 DIAGNOSIS — E781 Pure hyperglyceridemia: Secondary | ICD-10-CM | POA: Diagnosis not present

## 2018-10-14 DIAGNOSIS — Z720 Tobacco use: Secondary | ICD-10-CM | POA: Diagnosis not present

## 2018-10-14 DIAGNOSIS — C9192 Lymphoid leukemia, unspecified, in relapse: Secondary | ICD-10-CM | POA: Diagnosis not present

## 2018-10-14 DIAGNOSIS — K219 Gastro-esophageal reflux disease without esophagitis: Secondary | ICD-10-CM | POA: Diagnosis not present

## 2018-10-14 DIAGNOSIS — R600 Localized edema: Secondary | ICD-10-CM | POA: Diagnosis not present

## 2018-10-14 DIAGNOSIS — Z139 Encounter for screening, unspecified: Secondary | ICD-10-CM | POA: Diagnosis not present

## 2018-10-14 DIAGNOSIS — I10 Essential (primary) hypertension: Secondary | ICD-10-CM | POA: Diagnosis not present

## 2018-10-15 DIAGNOSIS — Z1389 Encounter for screening for other disorder: Secondary | ICD-10-CM | POA: Diagnosis not present

## 2018-10-15 DIAGNOSIS — G629 Polyneuropathy, unspecified: Secondary | ICD-10-CM | POA: Diagnosis not present

## 2018-10-15 DIAGNOSIS — Z79891 Long term (current) use of opiate analgesic: Secondary | ICD-10-CM | POA: Diagnosis not present

## 2018-10-15 DIAGNOSIS — M792 Neuralgia and neuritis, unspecified: Secondary | ICD-10-CM | POA: Diagnosis not present

## 2018-10-15 DIAGNOSIS — G894 Chronic pain syndrome: Secondary | ICD-10-CM | POA: Diagnosis not present

## 2018-10-15 DIAGNOSIS — M170 Bilateral primary osteoarthritis of knee: Secondary | ICD-10-CM | POA: Diagnosis not present

## 2018-10-23 DIAGNOSIS — Z5189 Encounter for other specified aftercare: Secondary | ICD-10-CM

## 2018-10-23 DIAGNOSIS — C9110 Chronic lymphocytic leukemia of B-cell type not having achieved remission: Secondary | ICD-10-CM

## 2018-10-23 DIAGNOSIS — D819 Combined immunodeficiency, unspecified: Secondary | ICD-10-CM | POA: Diagnosis not present

## 2018-10-23 DIAGNOSIS — D61818 Other pancytopenia: Secondary | ICD-10-CM

## 2018-10-23 DIAGNOSIS — D809 Immunodeficiency with predominantly antibody defects, unspecified: Secondary | ICD-10-CM

## 2018-11-12 DIAGNOSIS — M792 Neuralgia and neuritis, unspecified: Secondary | ICD-10-CM | POA: Diagnosis not present

## 2018-11-12 DIAGNOSIS — G894 Chronic pain syndrome: Secondary | ICD-10-CM | POA: Diagnosis not present

## 2018-11-12 DIAGNOSIS — Z1389 Encounter for screening for other disorder: Secondary | ICD-10-CM | POA: Diagnosis not present

## 2018-11-12 DIAGNOSIS — G629 Polyneuropathy, unspecified: Secondary | ICD-10-CM | POA: Diagnosis not present

## 2018-11-12 DIAGNOSIS — M170 Bilateral primary osteoarthritis of knee: Secondary | ICD-10-CM | POA: Diagnosis not present

## 2018-11-12 DIAGNOSIS — Z96652 Presence of left artificial knee joint: Secondary | ICD-10-CM | POA: Diagnosis not present

## 2018-11-12 DIAGNOSIS — Z79891 Long term (current) use of opiate analgesic: Secondary | ICD-10-CM | POA: Diagnosis not present

## 2018-11-12 DIAGNOSIS — Z471 Aftercare following joint replacement surgery: Secondary | ICD-10-CM | POA: Diagnosis not present

## 2018-11-12 DIAGNOSIS — M25561 Pain in right knee: Secondary | ICD-10-CM | POA: Diagnosis not present

## 2018-11-20 DIAGNOSIS — C9110 Chronic lymphocytic leukemia of B-cell type not having achieved remission: Secondary | ICD-10-CM | POA: Diagnosis not present

## 2018-11-20 DIAGNOSIS — D819 Combined immunodeficiency, unspecified: Secondary | ICD-10-CM | POA: Diagnosis not present

## 2018-11-30 DIAGNOSIS — H3500 Unspecified background retinopathy: Secondary | ICD-10-CM | POA: Diagnosis not present

## 2018-11-30 DIAGNOSIS — H2513 Age-related nuclear cataract, bilateral: Secondary | ICD-10-CM | POA: Diagnosis not present

## 2018-11-30 DIAGNOSIS — H43813 Vitreous degeneration, bilateral: Secondary | ICD-10-CM | POA: Diagnosis not present

## 2018-12-10 DIAGNOSIS — G894 Chronic pain syndrome: Secondary | ICD-10-CM | POA: Diagnosis not present

## 2018-12-10 DIAGNOSIS — M170 Bilateral primary osteoarthritis of knee: Secondary | ICD-10-CM | POA: Diagnosis not present

## 2018-12-10 DIAGNOSIS — M792 Neuralgia and neuritis, unspecified: Secondary | ICD-10-CM | POA: Diagnosis not present

## 2018-12-10 DIAGNOSIS — Z79891 Long term (current) use of opiate analgesic: Secondary | ICD-10-CM | POA: Diagnosis not present

## 2018-12-10 DIAGNOSIS — Z1389 Encounter for screening for other disorder: Secondary | ICD-10-CM | POA: Diagnosis not present

## 2018-12-10 DIAGNOSIS — Z471 Aftercare following joint replacement surgery: Secondary | ICD-10-CM | POA: Diagnosis not present

## 2018-12-10 DIAGNOSIS — G629 Polyneuropathy, unspecified: Secondary | ICD-10-CM | POA: Diagnosis not present

## 2018-12-18 DIAGNOSIS — D809 Immunodeficiency with predominantly antibody defects, unspecified: Secondary | ICD-10-CM | POA: Diagnosis not present

## 2018-12-18 DIAGNOSIS — D61818 Other pancytopenia: Secondary | ICD-10-CM | POA: Diagnosis not present

## 2018-12-18 DIAGNOSIS — D819 Combined immunodeficiency, unspecified: Secondary | ICD-10-CM | POA: Diagnosis not present

## 2018-12-18 DIAGNOSIS — Z5189 Encounter for other specified aftercare: Secondary | ICD-10-CM | POA: Diagnosis not present

## 2018-12-18 DIAGNOSIS — C9110 Chronic lymphocytic leukemia of B-cell type not having achieved remission: Secondary | ICD-10-CM | POA: Diagnosis not present

## 2018-12-29 DIAGNOSIS — Z96659 Presence of unspecified artificial knee joint: Secondary | ICD-10-CM | POA: Diagnosis not present

## 2019-01-07 DIAGNOSIS — Z1389 Encounter for screening for other disorder: Secondary | ICD-10-CM | POA: Diagnosis not present

## 2019-01-07 DIAGNOSIS — Z471 Aftercare following joint replacement surgery: Secondary | ICD-10-CM | POA: Diagnosis not present

## 2019-01-07 DIAGNOSIS — M792 Neuralgia and neuritis, unspecified: Secondary | ICD-10-CM | POA: Diagnosis not present

## 2019-01-07 DIAGNOSIS — M19012 Primary osteoarthritis, left shoulder: Secondary | ICD-10-CM | POA: Diagnosis not present

## 2019-01-07 DIAGNOSIS — M25561 Pain in right knee: Secondary | ICD-10-CM | POA: Diagnosis not present

## 2019-01-07 DIAGNOSIS — Z79891 Long term (current) use of opiate analgesic: Secondary | ICD-10-CM | POA: Diagnosis not present

## 2019-01-07 DIAGNOSIS — G629 Polyneuropathy, unspecified: Secondary | ICD-10-CM | POA: Diagnosis not present

## 2019-01-07 DIAGNOSIS — M170 Bilateral primary osteoarthritis of knee: Secondary | ICD-10-CM | POA: Diagnosis not present

## 2019-01-07 DIAGNOSIS — G894 Chronic pain syndrome: Secondary | ICD-10-CM | POA: Diagnosis not present

## 2019-01-14 DIAGNOSIS — M159 Polyosteoarthritis, unspecified: Secondary | ICD-10-CM | POA: Diagnosis not present

## 2019-01-14 DIAGNOSIS — Z720 Tobacco use: Secondary | ICD-10-CM | POA: Diagnosis not present

## 2019-01-14 DIAGNOSIS — I10 Essential (primary) hypertension: Secondary | ICD-10-CM | POA: Diagnosis not present

## 2019-01-14 DIAGNOSIS — E781 Pure hyperglyceridemia: Secondary | ICD-10-CM | POA: Diagnosis not present

## 2019-01-14 DIAGNOSIS — K219 Gastro-esophageal reflux disease without esophagitis: Secondary | ICD-10-CM | POA: Diagnosis not present

## 2019-01-15 DIAGNOSIS — Z23 Encounter for immunization: Secondary | ICD-10-CM | POA: Diagnosis not present

## 2019-01-15 DIAGNOSIS — C9110 Chronic lymphocytic leukemia of B-cell type not having achieved remission: Secondary | ICD-10-CM | POA: Diagnosis not present

## 2019-01-15 DIAGNOSIS — D819 Combined immunodeficiency, unspecified: Secondary | ICD-10-CM | POA: Diagnosis not present

## 2019-01-28 DIAGNOSIS — N3289 Other specified disorders of bladder: Secondary | ICD-10-CM | POA: Diagnosis not present

## 2019-01-28 DIAGNOSIS — R351 Nocturia: Secondary | ICD-10-CM | POA: Diagnosis not present

## 2019-02-08 DIAGNOSIS — Z1389 Encounter for screening for other disorder: Secondary | ICD-10-CM | POA: Diagnosis not present

## 2019-02-08 DIAGNOSIS — M25561 Pain in right knee: Secondary | ICD-10-CM | POA: Diagnosis not present

## 2019-02-08 DIAGNOSIS — G629 Polyneuropathy, unspecified: Secondary | ICD-10-CM | POA: Diagnosis not present

## 2019-02-08 DIAGNOSIS — Z79891 Long term (current) use of opiate analgesic: Secondary | ICD-10-CM | POA: Diagnosis not present

## 2019-02-08 DIAGNOSIS — M19012 Primary osteoarthritis, left shoulder: Secondary | ICD-10-CM | POA: Diagnosis not present

## 2019-02-08 DIAGNOSIS — M792 Neuralgia and neuritis, unspecified: Secondary | ICD-10-CM | POA: Diagnosis not present

## 2019-02-08 DIAGNOSIS — G894 Chronic pain syndrome: Secondary | ICD-10-CM | POA: Diagnosis not present

## 2019-02-08 DIAGNOSIS — M170 Bilateral primary osteoarthritis of knee: Secondary | ICD-10-CM | POA: Diagnosis not present

## 2019-02-11 DIAGNOSIS — C9110 Chronic lymphocytic leukemia of B-cell type not having achieved remission: Secondary | ICD-10-CM

## 2019-02-12 DIAGNOSIS — D819 Combined immunodeficiency, unspecified: Secondary | ICD-10-CM | POA: Diagnosis not present

## 2019-02-12 DIAGNOSIS — C9110 Chronic lymphocytic leukemia of B-cell type not having achieved remission: Secondary | ICD-10-CM | POA: Diagnosis not present

## 2019-03-08 DIAGNOSIS — G629 Polyneuropathy, unspecified: Secondary | ICD-10-CM | POA: Diagnosis not present

## 2019-03-08 DIAGNOSIS — Z79891 Long term (current) use of opiate analgesic: Secondary | ICD-10-CM | POA: Diagnosis not present

## 2019-03-08 DIAGNOSIS — M25561 Pain in right knee: Secondary | ICD-10-CM | POA: Diagnosis not present

## 2019-03-08 DIAGNOSIS — M170 Bilateral primary osteoarthritis of knee: Secondary | ICD-10-CM | POA: Diagnosis not present

## 2019-03-08 DIAGNOSIS — M19012 Primary osteoarthritis, left shoulder: Secondary | ICD-10-CM | POA: Diagnosis not present

## 2019-03-08 DIAGNOSIS — G894 Chronic pain syndrome: Secondary | ICD-10-CM | POA: Diagnosis not present

## 2019-03-08 DIAGNOSIS — M792 Neuralgia and neuritis, unspecified: Secondary | ICD-10-CM | POA: Diagnosis not present

## 2019-03-09 DIAGNOSIS — R351 Nocturia: Secondary | ICD-10-CM | POA: Diagnosis not present

## 2019-03-09 DIAGNOSIS — N3289 Other specified disorders of bladder: Secondary | ICD-10-CM | POA: Diagnosis not present

## 2019-03-12 DIAGNOSIS — C9110 Chronic lymphocytic leukemia of B-cell type not having achieved remission: Secondary | ICD-10-CM | POA: Diagnosis not present

## 2019-03-12 DIAGNOSIS — D819 Combined immunodeficiency, unspecified: Secondary | ICD-10-CM | POA: Diagnosis not present

## 2019-04-09 DIAGNOSIS — C9110 Chronic lymphocytic leukemia of B-cell type not having achieved remission: Secondary | ICD-10-CM | POA: Diagnosis not present

## 2019-06-04 DIAGNOSIS — Z5189 Encounter for other specified aftercare: Secondary | ICD-10-CM | POA: Diagnosis not present

## 2019-06-04 DIAGNOSIS — D809 Immunodeficiency with predominantly antibody defects, unspecified: Secondary | ICD-10-CM | POA: Diagnosis not present

## 2019-06-04 DIAGNOSIS — C9110 Chronic lymphocytic leukemia of B-cell type not having achieved remission: Secondary | ICD-10-CM | POA: Diagnosis not present

## 2019-06-04 DIAGNOSIS — D61818 Other pancytopenia: Secondary | ICD-10-CM

## 2019-08-27 DIAGNOSIS — C9110 Chronic lymphocytic leukemia of B-cell type not having achieved remission: Secondary | ICD-10-CM | POA: Diagnosis not present

## 2019-10-22 DIAGNOSIS — C9110 Chronic lymphocytic leukemia of B-cell type not having achieved remission: Secondary | ICD-10-CM

## 2019-12-19 ENCOUNTER — Encounter: Payer: Self-pay | Admitting: Oncology

## 2019-12-19 DIAGNOSIS — C9110 Chronic lymphocytic leukemia of B-cell type not having achieved remission: Secondary | ICD-10-CM | POA: Insufficient documentation

## 2020-01-07 ENCOUNTER — Encounter: Payer: Self-pay | Admitting: Pharmacist

## 2020-01-07 DIAGNOSIS — D801 Nonfamilial hypogammaglobulinemia: Secondary | ICD-10-CM | POA: Insufficient documentation

## 2020-01-13 ENCOUNTER — Other Ambulatory Visit: Payer: Self-pay | Admitting: Hematology and Oncology

## 2020-01-13 DIAGNOSIS — C9110 Chronic lymphocytic leukemia of B-cell type not having achieved remission: Secondary | ICD-10-CM | POA: Diagnosis not present

## 2020-01-13 LAB — CBC: RBC: 3.88 (ref 3.87–5.11)

## 2020-01-13 LAB — HEPATIC FUNCTION PANEL
ALT: 20 (ref 7–35)
AST: 25 (ref 13–35)
Alkaline Phosphatase: 72 (ref 25–125)
Bilirubin, Total: 0.5

## 2020-01-13 LAB — CBC AND DIFFERENTIAL
HCT: 35 — AB (ref 36–46)
Hemoglobin: 11.5 — AB (ref 12.0–16.0)
Neutrophils Absolute: 1370
Platelets: 63 — AB (ref 150–399)
WBC: 2.8

## 2020-01-13 LAB — BASIC METABOLIC PANEL
BUN: 19 (ref 4–21)
CO2: 25 — AB (ref 13–22)
Chloride: 107 (ref 99–108)
Creatinine: 0.9 (ref 0.5–1.1)
Glucose: 112
Potassium: 3.7 (ref 3.4–5.3)
Sodium: 141 (ref 137–147)

## 2020-01-13 LAB — COMPREHENSIVE METABOLIC PANEL
Albumin: 4.2 (ref 3.5–5.0)
Calcium: 8.9 (ref 8.7–10.7)

## 2020-01-14 ENCOUNTER — Other Ambulatory Visit: Payer: Self-pay

## 2020-01-14 ENCOUNTER — Inpatient Hospital Stay: Payer: Medicare Other | Attending: Oncology

## 2020-01-14 VITALS — BP 141/66 | HR 78 | Temp 97.8°F | Resp 18 | Ht 60.0 in | Wt 224.2 lb

## 2020-01-14 DIAGNOSIS — D819 Combined immunodeficiency, unspecified: Secondary | ICD-10-CM | POA: Insufficient documentation

## 2020-01-14 DIAGNOSIS — C9110 Chronic lymphocytic leukemia of B-cell type not having achieved remission: Secondary | ICD-10-CM | POA: Insufficient documentation

## 2020-01-14 DIAGNOSIS — T7840XA Allergy, unspecified, initial encounter: Secondary | ICD-10-CM

## 2020-01-14 DIAGNOSIS — D801 Nonfamilial hypogammaglobulinemia: Secondary | ICD-10-CM

## 2020-01-14 MED ORDER — DIPHENHYDRAMINE HCL 50 MG/ML IJ SOLN
25.0000 mg | Freq: Once | INTRAMUSCULAR | Status: AC
  Administered 2020-01-14: 25 mg via INTRAVENOUS

## 2020-01-14 MED ORDER — DIPHENHYDRAMINE HCL 50 MG/ML IJ SOLN
INTRAMUSCULAR | Status: AC
  Filled 2020-01-14: qty 1

## 2020-01-14 MED ORDER — LORAZEPAM 2 MG/ML IJ SOLN
INTRAMUSCULAR | Status: AC
  Filled 2020-01-14: qty 1

## 2020-01-14 MED ORDER — ALBUTEROL SULFATE (2.5 MG/3ML) 0.083% IN NEBU
2.5000 mg | INHALATION_SOLUTION | Freq: Once | RESPIRATORY_TRACT | Status: DC | PRN
  Filled 2020-01-14: qty 3

## 2020-01-14 MED ORDER — METHYLPREDNISOLONE SODIUM SUCC 125 MG IJ SOLR
INTRAMUSCULAR | Status: AC
  Filled 2020-01-14: qty 2

## 2020-01-14 MED ORDER — FAMOTIDINE IN NACL 20-0.9 MG/50ML-% IV SOLN
20.0000 mg | Freq: Once | INTRAVENOUS | Status: AC
  Administered 2020-01-14: 20 mg via INTRAVENOUS

## 2020-01-14 MED ORDER — LORAZEPAM 2 MG/ML IJ SOLN
0.5000 mg | Freq: Once | INTRAMUSCULAR | Status: AC
  Administered 2020-01-14: 0.5 mg via INTRAVENOUS

## 2020-01-14 MED ORDER — ACETAMINOPHEN 325 MG PO TABS
ORAL_TABLET | ORAL | Status: AC
  Filled 2020-01-14: qty 2

## 2020-01-14 MED ORDER — EPINEPHRINE 0.3 MG/0.3ML IJ SOAJ: 0.3000 mg | Freq: Once | INTRAMUSCULAR | Status: DC | PRN

## 2020-01-14 MED ORDER — FAMOTIDINE IN NACL 20-0.9 MG/50ML-% IV SOLN
20.0000 mg | Freq: Once | INTRAVENOUS | Status: AC | PRN
  Administered 2020-01-14: 20 mg via INTRAVENOUS

## 2020-01-14 MED ORDER — INFLUENZA VAC SPLIT QUAD 0.5 ML IM SUSY: 0.5000 mL | PREFILLED_SYRINGE | Freq: Once | INTRAMUSCULAR | Status: DC

## 2020-01-14 MED ORDER — METHYLPREDNISOLONE SODIUM SUCC 125 MG IJ SOLR
125.0000 mg | Freq: Once | INTRAMUSCULAR | Status: DC | PRN
  Administered 2020-01-14: 125 mg via INTRAVENOUS

## 2020-01-14 MED ORDER — SODIUM CHLORIDE 0.9 % IV SOLN
Freq: Once | INTRAVENOUS | Status: DC | PRN
  Filled 2020-01-14: qty 250

## 2020-01-14 MED ORDER — DEXTROSE 5 % IV SOLN
Freq: Once | INTRAVENOUS | Status: AC
  Filled 2020-01-14: qty 250

## 2020-01-14 MED ORDER — FAMOTIDINE IN NACL 20-0.9 MG/50ML-% IV SOLN
INTRAVENOUS | Status: AC
  Filled 2020-01-14: qty 100

## 2020-01-14 MED ORDER — ACETAMINOPHEN 325 MG PO TABS
650.0000 mg | ORAL_TABLET | Freq: Once | ORAL | Status: AC
  Administered 2020-01-14: 650 mg via ORAL

## 2020-01-14 MED ORDER — IMMUNE GLOBULIN (HUMAN) 10 GM/100ML IV SOLN
1.0000 g/kg | Freq: Once | INTRAVENOUS | Status: AC
  Administered 2020-01-14: 45 g via INTRAVENOUS
  Filled 2020-01-14: qty 400

## 2020-01-14 MED ORDER — DIPHENHYDRAMINE HCL 50 MG/ML IJ SOLN: 50.0000 mg | Freq: Once | INTRAMUSCULAR | Status: DC | PRN

## 2020-01-14 NOTE — Progress Notes (Signed)
1215: [facility] completed at 1214. Patient was asking to leave prior to 30 min wait time per policy when she began itching looked at lower back saw whelping and bright red rash noted on trunk , chest denies resp difficulty at present. Very restless up walking around because severe itching.Dayton Scrape FNP and Rosanne Sack Pac present orders received for solu medrol and pepcid as recorded on MAR. Patient c/o severe itching./df  1247 : Patient continues to complain of itching request intervention. Dayton Scrape FNP orders additional Benadryl 25 mg IVP now/given. 1538: Discharged Medrol Dosepak called [facility] for itching. Told to take benadryl tonight at bedtime. Patient stable at dischargec.

## 2020-01-14 NOTE — Progress Notes (Signed)
Patient experiencing severe itching upon completion of IVIG. Benadryl 25 mg given x 1 without relief. She also received Benadryl 25 mg pre-medication as well.  Solumedrol 125 mg given. Pepcid 40 mg given IV as well. She continues to have itching one hour later. We will give 0.5 mg ativan IV.

## 2020-01-31 ENCOUNTER — Other Ambulatory Visit: Payer: Self-pay | Admitting: Hematology and Oncology

## 2020-01-31 DIAGNOSIS — C9110 Chronic lymphocytic leukemia of B-cell type not having achieved remission: Secondary | ICD-10-CM

## 2020-02-09 ENCOUNTER — Other Ambulatory Visit: Payer: Self-pay | Admitting: Pharmacist

## 2020-02-09 ENCOUNTER — Other Ambulatory Visit: Payer: Self-pay | Admitting: Oncology

## 2020-02-09 ENCOUNTER — Telehealth: Payer: Self-pay | Admitting: Oncology

## 2020-02-09 DIAGNOSIS — C9110 Chronic lymphocytic leukemia of B-cell type not having achieved remission: Secondary | ICD-10-CM

## 2020-02-09 NOTE — Addendum Note (Signed)
Addended by: Juanetta Beets on: 02/09/2020 09:04 AM   Modules accepted: Orders

## 2020-02-09 NOTE — Progress Notes (Signed)
[place]  [address] [place],  [place]  [address] [phone]  Clinic Day:  02/10/2020  Referring physician: [name], MD   This document serves as a record of services personally performed by Hosie Poisson, MD. It was created on their behalf by [facility] E, a trained medical scribe. The creation of this record is based on the scribe's personal observations and the provider's statements to them.   CHIEF COMPLAINT:  CC: Chronic lymphocytic leukemia  Current Treatment:  Monthly IVIG   HISTORY OF PRESENT ILLNESS:  Samantha Warren is a 74 y.o. 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  She was on observation until November 2008. She was initially treated with chlorambucil and prednisone, as she refused intravenous chemotherapy.  She had a partial response to this regimen.  By January 2010, she had progressive disease with a white count over 200,000, with associated anemia, splenomegaly, thrombocytopenia, and adenopathy.  She then received 5 cycles of fludarabine with good response.  She did well until January 2012, when she had progression once again.  She had a single dose of bendamustine and rituximab, which kept her disease under control for over a year.  However, she had a severe allergic reaction to the rituximab, so has not received further rituximab.  She was hospitalized after the bendamustine because of severe pancytopenia and required multiple transfusions, so was placed on observation.  In March 2013, she had progression of disease again, so was treated with bendamustine for 6 cycles, at a 50% dose reduction, and once again had a excellent response.  She was on observation until August 2014, then had progression of disease, so was placed on ibrutinib 420 mg daily.  The ibrutinib had kept her disease under fairly good control, but then she develops severe bilateral lower extremity cellulitis in April  2016, so ibrutinib was placed on hold.  She had persistent cellulitis, as well as  Clostridium difficile colitis in May 2016 requiring hospitalization.  She was readmitted soon after discharge with worsening cellulitis, requiring a prolonged hospitalization.  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 She eventually had resolution of the cellulitis  As she was largely asymptomatic, we continued observation until February 2017, at which time she had worsening splenomegaly.  Repeat CT imaging in February showed progressive disease, with marked splenomegaly and bulky lymphadenopathy.  She was then placed back on ibrutinib 466m daily.  The lymphocytosis, anemia and thrombocytopenia slowly improved.  Due to neutropenia, ibrutinib was placed on hold in December 2017.  The ibrutinib was resumed in January 2018, but discontinued in August 2018 due to toxicities, mainly severe abdominal muscle cramping.  She was hospitalized in early September 2018 with urinary tract infection and pancytopenia.  She received multiple red blood cell and platelet transfusions during her stay.  She was admitted again in late September 2018 with perineal cellulitis extending to the lower abdomen and upper thigh, as well as persistent pancytopenia.  CT abdomen and pelvis revealed interval decrease in the abdominal lymphadenopathy, chronic massive splenomegaly with scattered small splenic infarcts, and small left pleural effusion with bibasilar atelectasis.  She required packed red blood cells while hospitalized.  She was discharged home with home health and followed up with the [facility]  We continued to follow her closely, but did not place her on a treatment due to the persistent cellulitis.  She then  was admitted in October 2018 with severe hypercalcemia, with a calcium of 15.  She was treated with IV fluids, zoledronic acid and Calcitonin injections.   We continued to monitor her closely.  She had recurrent hypercalcemia, for which she received IV fluids and zolendronic acid as an outpatient.    Due to worsening pancytopenia, she underwent bone marrow biopsy in January 2019.   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  The cellulitis had improved to a point we felt she could be treated again, so she was placed on venetoclax CLL in January 2019.  She has continued on venetoclax 400 mg daily after ramp up dosing and has tolerated that fairly well, except for mild diarrhea.  Since starting venetoclax, she has had decreasing splenomegaly and improvement in her pancytopenia.  She initially continued to require IV fluids and zoledronic acid for the hypercalcemia.  Her last dose of zoledronic acid was given in March 2019.  In March, she had an irregular heart rhythm and EKG revealed occasional premature atrial complexes, as well as an incomplete right bundle branch block.  This was stable compared to EKG done in October 2018.  Quantitative immunoglobulins done in August 2019 revealed a normal IgG, with low IgA and IgM.  She has continued IVIG monthly.  She had an episode of Campylobacter diarrhea in November 2019, which was treated with azithromycin for 7 days with resolution of her diarrhea.  Venetoclax was held temporarily until her diarrhea resolved.  She has since tolerated venetoclax without significant difficulty.  She completed a full 2 years of monotherapy venetoclax in January 2021.  We recommended continuing venetoclax 400 mg daily until progression of disease.  Annual bilateral mammogram from April 16th, 2021 revealed a possible mass in the left breast, which warranted further evaluation.  Right breast did not reveal any evidence of malignancy.  Unilateral diagnostic left mammogram from May 10th, revealed benign fibrocystic change within the outer left breast with no evidence of  malignancy.  Routine annual bilateral mammography was recommended.  She was evaluated by GI who recommended colonoscopy in May of next year.    INTERVAL HISTORY:  Shadai is here for follow up prior to monthly IVIG.  She states that she had a reaction with her last infusion.  She feels that they did something different at that appointment that may have caused the reaction.  She continues venetoclax 400 mg daily without difficulty.  Otherwise, she has been well, and only complains of arthralgias of bilateral knees.  Her white count is stable at 2.7 with an [place] of 1000, her hemoglobin has mildly improved to 11.8, and her platelet count has mildly decreased from 63,000 to 56,000.  Chemistries are unremarkable.  Her  appetite is good, and her weight is stable since her last visit.  She denies fever, chills or other signs of infection.  She denies nausea, vomiting, bowel issues, or abdominal pain.  She denies sore throat, cough, dyspnea, or chest pain.   REVIEW OF SYSTEMS:  Review of Systems  Musculoskeletal: Positive for arthralgias (bilateral knees).  All other systems reviewed and are negative.    VITALS:  Blood pressure (!) 161/70, pulse 77, temperature 98.3 F (36.8 C), resp. rate 18, height 5' (1.524 m), weight 221 lb 9.6 oz (100.5 kg), SpO2 98 %.  Wt Readings from Last 3 Encounters:  02/10/20 221 lb 9.6 oz (100.5 kg)  01/14/20 224 lb 4 oz (101.7 kg)  01/07/20 220 lb 3.2  oz (99.9 kg)    Body mass index is 43.28 kg/m.  Performance status (ECOG): 1 - Symptomatic but completely ambulatory  PHYSICAL EXAM:  Physical Exam Constitutional:      General: She is not in acute distress.    Appearance: Normal appearance. She is normal weight.  HENT:     Head: Normocephalic and atraumatic.  Eyes:     General: No scleral icterus.    Extraocular Movements: Extraocular movements intact.     Conjunctiva/sclera: Conjunctivae normal.     Pupils: Pupils are equal, round, and reactive to light.   Cardiovascular:     Rate and Rhythm: Normal rate and regular rhythm.     Pulses: Normal pulses.     Heart sounds: Normal heart sounds. No murmur heard.  No friction rub. No gallop.   Pulmonary:     Effort: Pulmonary effort is normal. No respiratory distress.     Breath sounds: Normal breath sounds.  Abdominal:     General: Bowel sounds are normal. There is no distension.     Palpations: Abdomen is soft. There is no mass.     Tenderness: There is no abdominal tenderness.  Musculoskeletal:        General: Normal range of motion.     Cervical back: Normal range of motion and neck supple.     Right lower leg: No edema.     Left lower leg: No edema.  Lymphadenopathy:     Cervical: No cervical adenopathy.  Skin:    General: Skin is warm and dry.  Neurological:     General: No focal deficit present.     Mental Status: She is alert and oriented to person, place, and time. Mental status is at baseline.  Psychiatric:        Mood and Affect: Mood normal.        Behavior: Behavior normal.        Thought Content: Thought content normal.        Judgment: Judgment normal.     LABS:   Her white count is stable at 2.7 with an ANC of 1000, her hemoglobin has mildly improved to 11.8, and her platelet count has mildly decreased from 63,000 to 56,000.  Chemistries are unremarkable.  CBC Latest Ref Rng & Units 01/13/2020  WBC - 2.8  Hemoglobin 12.0 - 16.0 11.5(A)  Hematocrit 36 - 46 35(A)  Platelets 150 - 399 63(A)   CMP Latest Ref Rng & Units 01/13/2020  BUN 4 - 21 19  Creatinine 0.5 - 1.1 0.9  Sodium 137 - 147 141  Potassium 3.4 - 5.3 3.7  Chloride 99 - 108 107  CO2 13 - 22 25(A)  Calcium 8.7 - 10.7 8.9  Alkaline Phos 25 - 125 72  AST 13 - 35 25  ALT 7 - 35 20    STUDIES:  No results found.   Allergies:  Allergies  Allergen Reactions  . Metronidazole   . Rituximab   . Sulfa Antibiotics     Current Medications: Current Outpatient Medications  Medication Sig Dispense  Refill  . alendronate (FOSAMAX) 70 MG tablet     . Ascorbic Acid (VITAMIN C WITH ROSE HIPS) 500 MG tablet Take 500 mg by mouth daily.    . benzonatate (TESSALON) 200 MG capsule TAKE ONE CAPSULE 3 TIMES A DAY AS NEEDED FOR COUGH  5  . cyclobenzaprine (FLEXERIL) 5 MG tablet     . hydrochlorothiazide (MICROZIDE) 12.5 MG capsule Take by mouth.    Marland Kitchen  KLOR-CON M20 20 MEQ tablet     . lidocaine (LIDODERM) 5 % 2 patches daily.    . NEOMYCIN-POLYMYXIN-HYDROCORTISONE (CORTISPORIN) 1 % SOLN otic solution     . omeprazole (PRILOSEC) 20 MG capsule Take 1 capsule by mouth daily.    Marland Kitchen oxyCODONE (ROXICODONE) 15 MG immediate release tablet Take 15 mg by mouth every 6 (six) hours as needed.    . prochlorperazine (COMPAZINE) 10 MG tablet     . VENCLEXTA 100 MG tablet Take 400 mg by mouth daily.     No current facility-administered medications for this visit.     ASSESSMENT & PLAN:   Assessment:   1. CLL, well controlled with venetoclax 453m daily, which she will continue.  The associated anemia and splenomegaly have resolved.  She has had persistent thrombocytopenia.  2. 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    3. Mild leukopenia and neutropenia.  4. 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  Plan: We will proceed with her monthly IVIG infusion on November 15th, and repeat this is 4 weeks.  She will return to clinic in 4 weeks for repeat CBC, CMP and evaluation prior to her next cycle of monthly IVIG.  She verbalizes understanding of an agreement to the plans discussed today.  She knows to call the office should any new questions or concerns arise.   I provided 20 minutes of face-to-face time during this this encounter and > 50% was spent counseling as documented under my assessment and plan.    CDerwood Kaplan MD [facility] LLCAT [facility]SMc[place]ennaNAlaska[address]Dept: [phone] Fax: [phone]  I, LRita Ohara am acting as scribe for CDerwood Kaplan MD  I have reviewed this report as typed by the medical scribe, and it is complete and accurate.

## 2020-02-09 NOTE — Telephone Encounter (Signed)
Sched treatmentPer 11/10 sched msg.Appt confirmed with patient.

## 2020-02-10 ENCOUNTER — Inpatient Hospital Stay: Payer: Medicare Other | Attending: Oncology | Admitting: Oncology

## 2020-02-10 ENCOUNTER — Other Ambulatory Visit: Payer: Self-pay

## 2020-02-10 ENCOUNTER — Other Ambulatory Visit: Payer: Self-pay | Admitting: Hematology and Oncology

## 2020-02-10 ENCOUNTER — Other Ambulatory Visit (INDEPENDENT_AMBULATORY_CARE_PROVIDER_SITE_OTHER): Payer: Medicare Other | Admitting: Oncology

## 2020-02-10 ENCOUNTER — Encounter: Payer: Self-pay | Admitting: Oncology

## 2020-02-10 ENCOUNTER — Inpatient Hospital Stay: Payer: Medicare Other

## 2020-02-10 VITALS — BP 161/70 | HR 77 | Temp 98.3°F | Resp 18 | Ht 60.0 in | Wt 221.6 lb

## 2020-02-10 DIAGNOSIS — C9110 Chronic lymphocytic leukemia of B-cell type not having achieved remission: Secondary | ICD-10-CM | POA: Diagnosis not present

## 2020-02-10 DIAGNOSIS — D819 Combined immunodeficiency, unspecified: Secondary | ICD-10-CM | POA: Insufficient documentation

## 2020-02-10 DIAGNOSIS — D709 Neutropenia, unspecified: Secondary | ICD-10-CM

## 2020-02-10 DIAGNOSIS — M170 Bilateral primary osteoarthritis of knee: Secondary | ICD-10-CM

## 2020-02-10 DIAGNOSIS — Z79899 Other long term (current) drug therapy: Secondary | ICD-10-CM | POA: Insufficient documentation

## 2020-02-10 DIAGNOSIS — D72819 Decreased white blood cell count, unspecified: Secondary | ICD-10-CM

## 2020-02-10 DIAGNOSIS — Z23 Encounter for immunization: Secondary | ICD-10-CM | POA: Insufficient documentation

## 2020-02-10 DIAGNOSIS — D61818 Other pancytopenia: Secondary | ICD-10-CM | POA: Insufficient documentation

## 2020-02-10 LAB — BASIC METABOLIC PANEL
BUN: 18 (ref 4–21)
CO2: 25 — AB (ref 13–22)
Chloride: 107 (ref 99–108)
Creatinine: 0.9 (ref 0.5–1.1)
Glucose: 111
Potassium: 4.2 (ref 3.4–5.3)
Sodium: 142 (ref 137–147)

## 2020-02-10 LAB — COMPREHENSIVE METABOLIC PANEL
Albumin: 4.2 (ref 3.5–5.0)
Calcium: 8.9 (ref 8.7–10.7)

## 2020-02-10 LAB — CBC AND DIFFERENTIAL
HCT: 36 (ref 36–46)
Hemoglobin: 11.8 — AB (ref 12.0–16.0)
Neutrophils Absolute: 1
Platelets: 56 — AB (ref 150–399)
WBC: 2.7

## 2020-02-10 LAB — HEPATIC FUNCTION PANEL
ALT: 26 (ref 7–35)
AST: 31 (ref 13–35)
Alkaline Phosphatase: 72 (ref 25–125)
Bilirubin, Total: 0.4

## 2020-02-10 LAB — CBC: RBC: 3.93 (ref 3.87–5.11)

## 2020-02-12 ENCOUNTER — Telehealth: Payer: Self-pay | Admitting: Oncology

## 2020-02-12 NOTE — Telephone Encounter (Signed)
Per 11/11 LOS - Patient scheduled for 12/10 Labs, Follow up Vida Roller at [facility] on 12/9).  Instructed for patient to be given next Sch Dec Appt at 11/17 Visit

## 2020-02-14 ENCOUNTER — Other Ambulatory Visit: Payer: Self-pay

## 2020-02-14 ENCOUNTER — Inpatient Hospital Stay: Payer: Medicare Other

## 2020-02-14 VITALS — BP 120/68 | HR 68 | Temp 97.9°F | Resp 18 | Ht 60.0 in | Wt 222.0 lb

## 2020-02-14 DIAGNOSIS — D819 Combined immunodeficiency, unspecified: Secondary | ICD-10-CM | POA: Diagnosis present

## 2020-02-14 DIAGNOSIS — D801 Nonfamilial hypogammaglobulinemia: Secondary | ICD-10-CM

## 2020-02-14 DIAGNOSIS — Z23 Encounter for immunization: Secondary | ICD-10-CM | POA: Diagnosis not present

## 2020-02-14 DIAGNOSIS — Z79899 Other long term (current) drug therapy: Secondary | ICD-10-CM | POA: Diagnosis not present

## 2020-02-14 MED ORDER — INFLUENZA VAC SPLIT QUAD 0.5 ML IM SUSY
0.5000 mL | PREFILLED_SYRINGE | Freq: Once | INTRAMUSCULAR | Status: AC
  Administered 2020-02-14: 0.5 mL via INTRAMUSCULAR

## 2020-02-14 MED ORDER — ACETAMINOPHEN 325 MG PO TABS
ORAL_TABLET | ORAL | Status: AC
  Filled 2020-02-14: qty 2

## 2020-02-14 MED ORDER — DIPHENHYDRAMINE HCL 50 MG/ML IJ SOLN
INTRAMUSCULAR | Status: AC
  Filled 2020-02-14: qty 1

## 2020-02-14 MED ORDER — DIPHENHYDRAMINE HCL 50 MG/ML IJ SOLN
25.0000 mg | Freq: Once | INTRAMUSCULAR | Status: AC
  Administered 2020-02-14: 25 mg via INTRAVENOUS

## 2020-02-14 MED ORDER — INFLUENZA VAC SPLIT QUAD 0.5 ML IM SUSY
PREFILLED_SYRINGE | INTRAMUSCULAR | Status: AC
  Filled 2020-02-14: qty 0.5

## 2020-02-14 MED ORDER — IMMUNE GLOBULIN (HUMAN) 10 GM/100ML IV SOLN
1.0000 g/kg | Freq: Once | INTRAVENOUS | Status: AC
  Administered 2020-02-14: 45 g via INTRAVENOUS
  Filled 2020-02-14: qty 50

## 2020-02-14 MED ORDER — SODIUM CHLORIDE 0.9% FLUSH
10.0000 mL | Freq: Once | INTRAVENOUS | Status: AC | PRN
  Administered 2020-02-14: 10 mL
  Filled 2020-02-14: qty 10

## 2020-02-14 MED ORDER — DEXTROSE 5 % IV SOLN
Freq: Once | INTRAVENOUS | Status: AC
  Filled 2020-02-14: qty 250

## 2020-02-14 MED ORDER — HEPARIN SOD (PORK) LOCK FLUSH 100 UNIT/ML IV SOLN
500.0000 [IU] | Freq: Once | INTRAVENOUS | Status: AC | PRN
  Administered 2020-02-14: 500 [IU]
  Filled 2020-02-14: qty 5

## 2020-02-14 MED ORDER — ACETAMINOPHEN 325 MG PO TABS
650.0000 mg | ORAL_TABLET | Freq: Once | ORAL | Status: AC
  Administered 2020-02-14: 650 mg via ORAL

## 2020-02-14 NOTE — Progress Notes (Signed)
PT STABLE AT TIME OF DISCHARGE 

## 2020-02-14 NOTE — Patient Instructions (Signed)
[facility] Discharge Instructions for Patients Receiving Chemotherapy  Today you received the following chemotherapy agents PRIVIGEN  To help prevent nausea and vomiting after your treatment, we encourage you to take your nausea medication.    If you develop nausea and vomiting that is not controlled by your nausea medication, call the clinic.   BELOW ARE SYMPTOMS THAT SHOULD BE REPORTED IMMEDIATELY:  *FEVER GREATER THAN 100.5 F  *CHILLS WITH OR WITHOUT FEVER  NAUSEA AND VOMITING THAT IS NOT CONTROLLED WITH YOUR NAUSEA MEDICATION  *UNUSUAL SHORTNESS OF BREATH  *UNUSUAL BRUISING OR BLEEDING  TENDERNESS IN MOUTH AND THROAT WITH OR WITHOUT PRESENCE OF ULCERS  *URINARY PROBLEMS  *BOWEL PROBLEMS  UNUSUAL RASH Items with * indicate a potential emergency and should be followed up as soon as possible.  Feel free to call the clinic should you have any questions or concerns at The clinic phone number is [phone].  Please show the [facility] at check-in to the Emergency Department and triage nurse.

## 2020-03-09 DIAGNOSIS — D819 Combined immunodeficiency, unspecified: Secondary | ICD-10-CM | POA: Insufficient documentation

## 2020-03-09 NOTE — Progress Notes (Signed)
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  Current Treatment:  Venetoclax 400mg  daily and monthly IVIG   HISTORY OF PRESENT ILLNESS:  Samantha Warren is a 74 y.o. 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  She was on observation until November 2008. She was initially treated with chlorambucil and prednisone, as she refused intravenous chemotherapy.  She had a partial response to this regimen.  By January 2010, she had progressive disease with a white count over 200,000, with associated anemia, splenomegaly, thrombocytopenia, and adenopathy.  She then received 5 cycles of fludarabine with good response.  She did well until January 2012, when she had progression once again.  She had a single dose of bendamustine and rituximab, which kept her disease under control for over a year.  However, she had a severe allergic reaction to the rituximab, so has not received further rituximab.  She was hospitalized after the bendamustine because of severe pancytopenia and required multiple transfusions, so was placed on observation.  In March 2013, she had progression of disease again, so was treated with bendamustine for 6 cycles, at a 50% dose reduction, and once again had a excellent response.  She was on observation until August 2014, then had progression of disease, so was placed on ibrutinib 420 mg daily.  The ibrutinib had kept her disease under fairly good control, but then she develops severe bilateral lower extremity cellulitis in April 2016, so ibrutinib was placed on hold.  She had persistent cellulitis, as well as  Clostridium difficile colitis in May 2016 requiring hospitalization.  She was readmitted soon after discharge with worsening  cellulitis, requiring a prolonged hospitalization.  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 She eventually had resolution of the cellulitis  As she was largely asymptomatic, we continued observation until February 2017, at which time she had worsening splenomegaly.  Repeat CT imaging in February showed progressive disease, with marked splenomegaly and bulky lymphadenopathy.  She was then placed back on ibrutinib 420mg  daily.  The lymphocytosis, anemia and thrombocytopenia slowly improved.  Due to neutropenia, ibrutinib was placed on hold in December 2017.  The ibrutinib was resumed in January 2018, but discontinued in August 2018 due to toxicities, mainly severe abdominal muscle cramping.  She was hospitalized in early September 2018 with urinary tract infection and pancytopenia.  She received multiple red blood cell and platelet transfusions during her stay.  She was admitted again in late September 2018 with perineal cellulitis extending to the lower abdomen and upper thigh, as well as persistent pancytopenia.  CT abdomen and pelvis revealed interval decrease in the abdominal lymphadenopathy, chronic massive splenomegaly with scattered small splenic infarcts, and small left pleural effusion with bibasilar atelectasis.  She required packed red blood cells while hospitalized.  She was discharged home with home health and followed up with the [facility].  We continued to follow her closely, but did not place her on a treatment due to the persistent cellulitis.  She then was admitted in October 2018 with severe hypercalcemia, with a calcium of 15.  She was treated with IV fluids, zoledronic acid and Calcitonin injections.  We continued to monitor her closely.  She had recurrent hypercalcemia,  for which she received IV fluids and zolendronic acid as an outpatient.    Due to worsening pancytopenia, she underwent bone marrow  biopsy in January 2019.   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  The cellulitis had improved to a point we felt she could be treated again, so she was placed on venetoclax CLL in January 2019.  She has continued on venetoclax 400 mg daily after ramp up dosing and has tolerated that fairly well, except for mild diarrhea.  Since starting venetoclax, she has had decreasing splenomegaly and improvement in her pancytopenia.  She initially continued to require IV fluids and zoledronic acid for the hypercalcemia.  Her last dose of zoledronic acid was given in March 2019.  In March, she had an irregular heart rhythm and EKG revealed occasional premature atrial complexes, as well as an incomplete right bundle branch block.  This was stable compared to EKG done in October 2018.  Quantitative immunoglobulins done in August 2019 revealed a normal IgG, with low IgA and IgM.  She has continued IVIG monthly.  She had an episode of Campylobacter diarrhea in November 2019, which was treated with azithromycin for 7 days with resolution of her diarrhea.  She was evaluated by GI who recommended colonoscopy in May of next year.  Venetoclax was held temporarily until her diarrhea resolved.  She has since tolerated venetoclax without significant difficulty.    She completed a full 2 years of monotherapy with venetoclax in January 2021.  We recommended continuing venetoclax 400 mg daily until progression of disease.  Annual bilateral mammogram in April revealed a possible mass in the left breast, which warranted further evaluation.  Right breast did not reveal any evidence of malignancy.  Unilateral diagnostic left mammogram in May, revealed benign fibrocystic change within the outer left breast with no evidence of malignancy.  Routine annual bilateral mammography was recommended.    INTERVAL HISTORY:  Samantha Warren is here for follow up prior to monthly IVIG and  states she continues venetoclax 400 mg daily without difficulty.  She continues to report  bilateral knee pain.  She denies fevers, chills or night sweats.  She denies fatigue. Her  appetite is good and her weight is stable since her last visit.  REVIEW OF SYSTEMS:  Review of Systems  Constitutional: Negative for appetite change, chills, fatigue, fever and unexpected weight change.  HENT:   Negative for lump/mass, mouth sores and sore throat.   Respiratory: Positive for cough (occasional, dry). Negative for shortness of breath and wheezing.   Cardiovascular: Negative for chest pain and leg swelling.  Gastrointestinal: Negative for abdominal pain, constipation, diarrhea, nausea and vomiting.  Genitourinary: Negative for difficulty urinating, dysuria, frequency and hematuria.   Musculoskeletal: Positive for arthralgias (chronic, bilateral knee pain). Negative for back pain and myalgias.  Skin: Negative for rash.  Neurological: Negative for dizziness, extremity weakness and headaches.  Hematological: Negative for adenopathy. Does not bruise/bleed easily.  Psychiatric/Behavioral: Negative for depression. The patient is not nervous/anxious.      VITALS:  Blood pressure (!) 163/75, pulse 75, temperature 98.4 F (36.9 C), temperature source Oral, resp. rate 18, height 5' (1.524 m), weight 219 lb 9.6 oz (99.6 kg), SpO2 99 %.  Wt Readings from Last 3 Encounters:  03/10/20 219 lb 9.6 oz (99.6 kg)  02/14/20 222 lb (100.7 kg)  02/10/20 221 lb 9.6 oz (100.5 kg)    Body mass index is 42.89 kg/m.  Performance status (ECOG): 1 -  Symptomatic but completely ambulatory  PHYSICAL EXAM:  Physical Exam Constitutional:      General: She is not in acute distress.    Appearance: Normal appearance. She is not ill-appearing.  HENT:     Mouth/Throat:     Mouth: Mucous membranes are moist.     Pharynx: Oropharynx is clear. No oropharyngeal exudate or posterior oropharyngeal erythema.  Eyes:     General:  No scleral icterus.    Extraocular Movements: Extraocular movements intact.     Conjunctiva/sclera: Conjunctivae normal.     Pupils: Pupils are equal, round, and reactive to light.  Cardiovascular:     Rate and Rhythm: Normal rate and regular rhythm.     Heart sounds: Normal heart sounds. No murmur heard. No friction rub. No gallop.   Pulmonary:     Effort: No respiratory distress.     Breath sounds: Normal breath sounds. No stridor. No wheezing, rhonchi or rales.  Abdominal:     General: There is no distension.     Palpations: Abdomen is soft. There is no hepatomegaly, splenomegaly or mass.     Tenderness: There is no abdominal tenderness. There is no guarding.     Hernia: No hernia is present.  Musculoskeletal:     Cervical back: Neck supple. No tenderness.     Right lower leg: No edema.     Left lower leg: No edema.  Lymphadenopathy:     Cervical: No cervical adenopathy.  Skin:    General: Skin is warm.     Coloration: Skin is not jaundiced.     Findings: No rash.  Neurological:     General: No focal deficit present.     Mental Status: She is alert and oriented to person, place, and time.     Cranial Nerves: No cranial nerve deficit.  Psychiatric:        Mood and Affect: Mood normal.        Behavior: Behavior normal.    LABS:   CBC Latest Ref Rng & Units 03/10/2020 02/10/2020 01/13/2020  WBC - 3.0 2.7 2.8  Hemoglobin 12.0 - 16.0 12.6 11.8(A) 11.5(A)  Hematocrit 36 - 46 38 36 35(A)  Platelets 150 - 399 85(A) 56(A) 63(A)   CMP Latest Ref Rng & Units 03/10/2020 02/10/2020 01/13/2020  BUN 4 - 21 16 18 19   Creatinine 0.5 - 1.1 1.0 0.9 0.9  Sodium 137 - 147 143 142 141  Potassium 3.4 - 5.3 3.5 4.2 3.7  Chloride 99 - 108 109(A) 107 107  CO2 13 - 22 23(A) 25(A) 25(A)  Calcium 8.7 - 10.7 9.5 8.9 8.9  Alkaline Phos 25 - 125 78 72 72  AST 13 - 35 25 31 25   ALT 7 - 35 22 26 20     STUDIES:  No results found.   Allergies:  Allergies  Allergen Reactions  .  Metronidazole   . Rituximab   . Sulfa Antibiotics     Current Medications: Current Outpatient Medications  Medication Sig Dispense Refill  . alendronate (FOSAMAX) 70 MG tablet     . Ascorbic Acid (VITAMIN C WITH ROSE HIPS) 500 MG tablet Take 500 mg by mouth daily.    . benzonatate (TESSALON) 200 MG capsule TAKE ONE CAPSULE 3 TIMES A DAY AS NEEDED FOR COUGH  5  . cyclobenzaprine (FLEXERIL) 5 MG tablet     . diclofenac Sodium (VOLTAREN) 1 % GEL Apply topically.    . hydrochlorothiazide (MICROZIDE) 12.5 MG capsule Take by mouth.    Marland Kitchen  KLOR-CON M20 20 MEQ tablet     . lidocaine (LIDODERM) 5 % 2 patches daily.    . NEOMYCIN-POLYMYXIN-HYDROCORTISONE (CORTISPORIN) 1 % SOLN otic solution     . omeprazole (PRILOSEC) 20 MG capsule Take 1 capsule by mouth daily.    Marland Kitchen oxyCODONE (ROXICODONE) 15 MG immediate release tablet Take 15 mg by mouth every 6 (six) hours as needed.    . prochlorperazine (COMPAZINE) 10 MG tablet     . VENCLEXTA 100 MG tablet Take 400 mg by mouth daily.    . Vibegron (GEMTESA) 75 MG TABS Take 75 mg by mouth at bedtime.      No current facility-administered medications for this visit.     ASSESSMENT & PLAN:   Assessment:   1. CLL, well controlled with venetoclax 400mg  daily, which she will continue. All of her blood counts have improved today. 2. 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   3. 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  Plan: We will proceed with IVIG infusion on December 13th. We will plan to see in 4 weeks with a CBC and comprehensive metabolic panel prior to her next IVIG.    The patient understands the plans discussed today and is in agreement with them.  She knows to contact our office if she develops issues mediate clinical assessment.   [name], PA-C [facility] AT [facility] [place] [address] Dept: [phone] Dept Fax: [phone]

## 2020-03-10 ENCOUNTER — Inpatient Hospital Stay: Payer: Medicare Other | Attending: Oncology

## 2020-03-10 ENCOUNTER — Inpatient Hospital Stay (INDEPENDENT_AMBULATORY_CARE_PROVIDER_SITE_OTHER): Payer: Medicare Other | Admitting: Hematology and Oncology

## 2020-03-10 ENCOUNTER — Other Ambulatory Visit: Payer: Self-pay

## 2020-03-10 ENCOUNTER — Other Ambulatory Visit: Payer: Self-pay | Admitting: Hematology and Oncology

## 2020-03-10 ENCOUNTER — Encounter: Payer: Self-pay | Admitting: Hematology and Oncology

## 2020-03-10 ENCOUNTER — Telehealth: Payer: Self-pay | Admitting: Oncology

## 2020-03-10 VITALS — BP 163/75 | HR 75 | Temp 98.4°F | Resp 18 | Ht 60.0 in | Wt 219.6 lb

## 2020-03-10 DIAGNOSIS — D819 Combined immunodeficiency, unspecified: Secondary | ICD-10-CM | POA: Insufficient documentation

## 2020-03-10 DIAGNOSIS — C9110 Chronic lymphocytic leukemia of B-cell type not having achieved remission: Secondary | ICD-10-CM | POA: Diagnosis not present

## 2020-03-10 LAB — HEPATIC FUNCTION PANEL
ALT: 22 (ref 7–35)
AST: 25 (ref 13–35)
Alkaline Phosphatase: 78 (ref 25–125)
Bilirubin, Total: 0.5

## 2020-03-10 LAB — CBC AND DIFFERENTIAL
HCT: 38 (ref 36–46)
Hemoglobin: 12.6 (ref 12.0–16.0)
Neutrophils Absolute: 1.56
Platelets: 85 — AB (ref 150–399)
WBC: 3

## 2020-03-10 LAB — COMPREHENSIVE METABOLIC PANEL
Albumin: 4.6 (ref 3.5–5.0)
Calcium: 9.5 (ref 8.7–10.7)

## 2020-03-10 LAB — CBC: RBC: 4.17 (ref 3.87–5.11)

## 2020-03-10 LAB — BASIC METABOLIC PANEL
BUN: 16 (ref 4–21)
CO2: 23 — AB (ref 13–22)
Chloride: 109 — AB (ref 99–108)
Creatinine: 1 (ref 0.5–1.1)
Glucose: 114
Potassium: 3.5 (ref 3.4–5.3)
Sodium: 143 (ref 137–147)

## 2020-03-10 NOTE — Telephone Encounter (Signed)
12/10 LOS NEXT APPT GIVEN TO PATIENT

## 2020-03-13 ENCOUNTER — Inpatient Hospital Stay: Payer: Medicare Other

## 2020-03-13 ENCOUNTER — Other Ambulatory Visit: Payer: Self-pay

## 2020-03-13 VITALS — BP 143/76 | HR 72 | Temp 97.9°F | Resp 18 | Ht 60.0 in | Wt 220.2 lb

## 2020-03-13 DIAGNOSIS — D801 Nonfamilial hypogammaglobulinemia: Secondary | ICD-10-CM

## 2020-03-13 DIAGNOSIS — D819 Combined immunodeficiency, unspecified: Secondary | ICD-10-CM | POA: Diagnosis present

## 2020-03-13 MED ORDER — ACETAMINOPHEN 325 MG PO TABS
650.0000 mg | ORAL_TABLET | Freq: Once | ORAL | Status: AC
Start: 2020-03-13 — End: 2020-03-13
  Administered 2020-03-13: 650 mg via ORAL

## 2020-03-13 MED ORDER — DIPHENHYDRAMINE HCL 50 MG/ML IJ SOLN
INTRAMUSCULAR | Status: AC
  Filled 2020-03-13: qty 1

## 2020-03-13 MED ORDER — DEXTROSE 5 % IV SOLN
Freq: Once | INTRAVENOUS | Status: AC
  Filled 2020-03-13: qty 250

## 2020-03-13 MED ORDER — DIPHENHYDRAMINE HCL 50 MG/ML IJ SOLN
25.0000 mg | Freq: Once | INTRAMUSCULAR | Status: AC
  Administered 2020-03-13: 25 mg via INTRAVENOUS

## 2020-03-13 MED ORDER — ACETAMINOPHEN 325 MG PO TABS
ORAL_TABLET | ORAL | Status: AC
  Filled 2020-03-13: qty 1

## 2020-03-13 MED ORDER — IMMUNE GLOBULIN (HUMAN) 10 GM/100ML IV SOLN
1.0000 g/kg | Freq: Once | INTRAVENOUS | Status: AC
  Administered 2020-03-13: 45 g via INTRAVENOUS
  Filled 2020-03-13: qty 400

## 2020-03-13 MED ORDER — HEPARIN SOD (PORK) LOCK FLUSH 100 UNIT/ML IV SOLN
500.0000 [IU] | Freq: Once | INTRAVENOUS | Status: AC | PRN
  Administered 2020-03-13: 500 [IU]
  Filled 2020-03-13: qty 5

## 2020-03-13 MED ORDER — LIDOCAINE-PRILOCAINE 2.5-2.5 % EX CREA: 1.0000 "application " | TOPICAL_CREAM | CUTANEOUS | 0 refills | Status: DC | PRN

## 2020-03-13 NOTE — Progress Notes (Signed)
1245:PT STABLE AT TIME OF DISCHARGE ° °

## 2020-03-13 NOTE — Patient Instructions (Signed)
[facility] Discharge Instructions for Patients Receiving Chemotherapy  Today you received the following chemotherapy agents Privigin  To help prevent nausea and vomiting after your treatment, we encourage you to take your nausea medication as directed.   If you develop nausea and vomiting that is not controlled by your nausea medication, call the clinic.   BELOW ARE SYMPTOMS THAT SHOULD BE REPORTED IMMEDIATELY:  *FEVER GREATER THAN 100.5 F  *CHILLS WITH OR WITHOUT FEVER  NAUSEA AND VOMITING THAT IS NOT CONTROLLED WITH YOUR NAUSEA MEDICATION  *UNUSUAL SHORTNESS OF BREATH  *UNUSUAL BRUISING OR BLEEDING  TENDERNESS IN MOUTH AND THROAT WITH OR WITHOUT PRESENCE OF ULCERS  *URINARY PROBLEMS  *BOWEL PROBLEMS  UNUSUAL RASH Items with * indicate a potential emergency and should be followed up as soon as possible.  Feel free to call the clinic should you have any questions or concerns at The clinic phone number is [phone].  Please show the [facility] at check-in to the Emergency Department and triage nurse.

## 2020-04-04 NOTE — Addendum Note (Signed)
Addended by: Domenic Schwab on: 04/04/2020 02:20 PM   Modules accepted: Orders

## 2020-04-06 NOTE — Progress Notes (Signed)
[name]  [address] [place],  [place]  [address] [phone]  Clinic Day:  04/10/2020  Referring physician: [name], MD   This document serves as a record of services personally performed by Hosie Poisson, MD. It was created on their behalf by [facility], a trained medical scribe. The creation of this record is based on the scribe's personal observations and the provider's statements to them.   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  Current Treatment:  Venetoclax 410m daily and monthly IVIG   HISTORY OF PRESENT ILLNESS:  Samantha Warren a 75y.o. 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  She was on observation until November 2008. She was initially treated with chlorambucil and prednisone, as she refused intravenous chemotherapy.  She had a partial response to this regimen.  By January 2010, she had progressive disease with a white count over 200,000, with associated anemia, splenomegaly, thrombocytopenia, and adenopathy.  She then received 5 cycles of fludarabine with good response.  She did well until January 2012, when she had progression once again.  She had a single dose of bendamustine and rituximab, which kept her disease under control for over a year.  However, she had a severe allergic reaction to the rituximab, so has not received further rituximab.  She was hospitalized after the bendamustine because of severe pancytopenia and required multiple transfusions, so was placed on observation.  In March 2013, she had progression of disease again, so was treated with bendamustine for 6 cycles, at a 50% dose reduction, and once again had a excellent response.  She was on observation until August 2014, then had progression of disease, so was placed on ibrutinib 420 mg daily.  The ibrutinib had kept her disease under fairly good control,  but then she develops severe bilateral lower extremity cellulitis in April 2016, so ibrutinib was placed on hold.  She had persistent cellulitis, as well as  Clostridium difficile colitis in May 2016 requiring hospitalization.  She was readmitted soon after discharge with worsening cellulitis, requiring a prolonged hospitalization.  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 She eventually had resolution of the cellulitis  As she was largely asymptomatic, we continued observation until February 2017, at which time she had worsening splenomegaly.  Repeat CT imaging in February showed progressive disease, with marked splenomegaly and bulky lymphadenopathy.  She was then placed back on ibrutinib 4256mdaily.  The lymphocytosis, anemia and thrombocytopenia slowly improved.  Due to neutropenia, ibrutinib was placed on hold in December 2017.  The ibrutinib was resumed in January 2018, but discontinued in August 2018 due to toxicities, mainly severe abdominal muscle cramping.  She was hospitalized in early September 2018 with urinary tract infection and pancytopenia.  She received multiple red blood cell and platelet transfusions during her stay.  She was admitted again in late September 2018 with perineal cellulitis extending to the lower abdomen and upper thigh, as well as persistent pancytopenia.  CT abdomen and pelvis revealed interval decrease in the abdominal lymphadenopathy, chronic massive splenomegaly with scattered small splenic infarcts, and small left pleural effusion with bibasilar atelectasis.  She required packed red blood cells while hospitalized.  She was discharged home with home health and followed up with the [facility] We continued to follow her closely, but did not place her on a  treatment due to the persistent cellulitis.  She then was admitted in October 2018 with severe hypercalcemia, with a calcium of 15.   She was treated with IV fluids, zoledronic acid and Calcitonin injections.  We continued to monitor her closely.  She had recurrent hypercalcemia, for which she received IV fluids and zolendronic acid as an outpatient.    Due to worsening pancytopenia, she underwent bone marrow biopsy in January 2019.   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  The cellulitis had improved to a point we felt she could be treated again, so she was placed on venetoclax CLL in January 2019.  She has continued on venetoclax 400 mg daily after ramp up dosing and has tolerated that fairly well, except for mild diarrhea.  Since starting venetoclax, she has had decreasing splenomegaly and improvement in her pancytopenia.  She initially continued to require IV fluids and zoledronic acid for the hypercalcemia.  Her last dose of zoledronic acid was given in March 2019.  In March, she had an irregular heart rhythm and EKG revealed occasional premature atrial complexes, as well as an incomplete right bundle branch block.  This was stable compared to EKG done in October 2018.  Quantitative immunoglobulins done in August 2019 revealed a normal IgG, with low IgA and IgM.  She has continued IVIG monthly.  She had an episode of Campylobacter diarrhea in November 2019, which was treated with azithromycin for 7 days with resolution of her diarrhea.  She was evaluated by GI who recommended colonoscopy in May of next year.  Venetoclax was held temporarily until her diarrhea resolved.  She has since tolerated venetoclax without significant difficulty.    She completed a full 2 years of monotherapy with venetoclax in January 2021.  We recommended continuing venetoclax 400 mg daily until progression of disease, or unacceptable toxicities.  Annual bilateral mammogram in April revealed a possible mass in the left breast, which warranted further evaluation.  Right breast did not  reveal any evidence of malignancy.  Unilateral diagnostic left mammogram in May, revealed benign fibrocystic change within the outer left breast with no evidence of malignancy.  Routine annual bilateral mammography was recommended.    INTERVAL HISTORY:  Samantha Warren is here for follow up prior to monthly IVIG.  She continues venetoclax 400 mg daily without significant difficulty.  She states that she has been well and denies complaints today other than arthralgias of the bilateral knees.  She uses lidocaine patches occasionally for her pain with good improvement.  She is scheduled with Dr. Truman Hayward on January 28th for routine evaluation.  Her white count has mildly decreased from 3.0 to 2.5 with an ANC of 1330, her platelet count has decreased from 85,000 to 59,000, and her hemoglobin is normal.  Chemistries are are unremarkable.  Her  appetite is good, and she has lost 2 pounds since her last visit.  She denies fever, chills or other signs of infection.  She denies nausea, vomiting, bowel issues, or abdominal pain.  She denies sore throat, cough, dyspnea, or chest pain.  REVIEW OF SYSTEMS:  Review of Systems  Constitutional: Negative.   HENT:  Negative.   Eyes: Negative.   Respiratory: Negative.   Cardiovascular: Negative.   Gastrointestinal: Negative.   Endocrine: Negative.   Genitourinary: Negative.    Musculoskeletal: Positive for arthralgias (of the bilateral knees).  Skin: Negative.   Neurological: Negative.   Hematological: Negative.   Psychiatric/Behavioral: Negative.  VITALS:  Blood pressure (!) 141/74, pulse 73, temperature 98.4 F (36.9 C), temperature source Oral, resp. rate 18, height 5' (1.524 m), weight 218 lb 4.8 oz (99 kg), SpO2 97 %.  Wt Readings from Last 3 Encounters:  04/10/20 218 lb 4.8 oz (99 kg)  03/13/20 220 lb 4 oz (99.9 kg)  03/10/20 219 lb 9.6 oz (99.6 kg)    Body mass index is 42.63 kg/m.  Performance status (ECOG): 1 - Symptomatic but completely  ambulatory  PHYSICAL EXAM:  Physical Exam Constitutional:      General: She is not in acute distress.    Appearance: Normal appearance. She is normal weight.  HENT:     Head: Normocephalic and atraumatic.  Eyes:     General: No scleral icterus.    Extraocular Movements: Extraocular movements intact.     Conjunctiva/sclera: Conjunctivae normal.     Pupils: Pupils are equal, round, and reactive to light.  Cardiovascular:     Rate and Rhythm: Normal rate and regular rhythm.     Pulses: Normal pulses.     Heart sounds: Normal heart sounds. No murmur heard. No friction rub. No gallop.   Pulmonary:     Effort: Pulmonary effort is normal. No respiratory distress.     Breath sounds: Normal breath sounds.  Abdominal:     General: Bowel sounds are normal. There is no distension.     Palpations: Abdomen is soft. There is no mass.     Tenderness: There is no abdominal tenderness.  Musculoskeletal:        General: Normal range of motion.     Cervical back: Normal range of motion and neck supple.     Right lower leg: No edema.     Left lower leg: No edema.     Comments: Swelling of the right knee  Lymphadenopathy:     Cervical: No cervical adenopathy.  Skin:    General: Skin is warm and dry.  Neurological:     General: No focal deficit present.     Mental Status: She is alert and oriented to person, place, and time. Mental status is at baseline.  Psychiatric:        Mood and Affect: Mood normal.        Behavior: Behavior normal.        Thought Content: Thought content normal.        Judgment: Judgment normal.    LABS:   CBC Latest Ref Rng & Units 03/10/2020 02/10/2020 01/13/2020  WBC - 3.0 2.7 2.8  Hemoglobin 12.0 - 16.0 12.6 11.8(A) 11.5(A)  Hematocrit 36 - 46 38 36 35(A)  Platelets 150 - 399 85(A) 56(A) 63(A)   CMP Latest Ref Rng & Units 03/10/2020 02/10/2020 01/13/2020  BUN 4 - 21 16 18 19   Creatinine 0.5 - 1.1 1.0 0.9 0.9  Sodium 137 - 147 143 142 141  Potassium 3.4 -  5.3 3.5 4.2 3.7  Chloride 99 - 108 109(A) 107 107  CO2 13 - 22 23(A) 25(A) 25(A)  Calcium 8.7 - 10.7 9.5 8.9 8.9  Alkaline Phos 25 - 125 78 72 72  AST 13 - 35 25 31 25   ALT 7 - 35 22 26 20     STUDIES:  No results found.   Allergies:  Allergies  Allergen Reactions  . Metronidazole   . Rituximab   . Sulfa Antibiotics     Current Medications: Current Outpatient Medications  Medication Sig Dispense Refill  . alendronate (FOSAMAX) 70 MG  tablet     . Ascorbic Acid (VITAMIN C WITH ROSE HIPS) 500 MG tablet Take 500 mg by mouth daily.    . benzonatate (TESSALON) 200 MG capsule TAKE ONE CAPSULE 3 TIMES A DAY AS NEEDED FOR COUGH  5  . cyclobenzaprine (FLEXERIL) 5 MG tablet     . diclofenac Sodium (VOLTAREN) 1 % GEL Apply topically.    . hydrochlorothiazide (MICROZIDE) 12.5 MG capsule Take by mouth.    Marland Kitchen KLOR-CON M20 20 MEQ tablet     . lidocaine (LIDODERM) 5 % 2 patches daily.    Marland Kitchen lidocaine-prilocaine (EMLA) cream Apply 1 application topically as needed. 30 g 0  . NEOMYCIN-POLYMYXIN-HYDROCORTISONE (CORTISPORIN) 1 % SOLN otic solution     . omeprazole (PRILOSEC) 20 MG capsule Take 1 capsule by mouth daily.    Marland Kitchen oxyCODONE (ROXICODONE) 15 MG immediate release tablet Take 15 mg by mouth every 6 (six) hours as needed.    . prochlorperazine (COMPAZINE) 10 MG tablet     . VENCLEXTA 100 MG tablet Take 400 mg by mouth daily.    . Vibegron (GEMTESA) 75 MG TABS Take 75 mg by mouth at bedtime.      No current facility-administered medications for this visit.     ASSESSMENT & PLAN:   Assessment:   1. CLL, well controlled with venetoclax 461m daily, which she will continue. All of her blood counts remain fairly stable.  2. 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    3. 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  Plan: We will proceed with IVIG infusion on January 11th.  She knows to continue  venetoclax 400 mg daily.  We will plan to see in 4 weeks with a CBC and comprehensive metabolic panel prior to her next IVIG.  The patient understands the plans discussed today and is in agreement with them.  She knows to contact our office if she develops issues mediate clinical assessment.   CDerwood Kaplan MD CTaylorville Memorial HospitalAT AG [name]: [phone] Fax: [phone]   I, LRita Ohara am acting as scribe for CDerwood Kaplan MD  I have reviewed this report as typed by the medical scribe, and it is complete and accurate.

## 2020-04-09 ENCOUNTER — Other Ambulatory Visit: Payer: Self-pay | Admitting: Oncology

## 2020-04-09 DIAGNOSIS — C9110 Chronic lymphocytic leukemia of B-cell type not having achieved remission: Secondary | ICD-10-CM

## 2020-04-10 ENCOUNTER — Inpatient Hospital Stay: Payer: Medicare Other | Attending: Oncology | Admitting: Oncology

## 2020-04-10 ENCOUNTER — Other Ambulatory Visit: Payer: Self-pay | Admitting: Oncology

## 2020-04-10 ENCOUNTER — Encounter: Payer: Self-pay | Admitting: Oncology

## 2020-04-10 ENCOUNTER — Other Ambulatory Visit: Payer: Self-pay | Admitting: Hematology and Oncology

## 2020-04-10 ENCOUNTER — Inpatient Hospital Stay: Payer: Medicare Other

## 2020-04-10 ENCOUNTER — Other Ambulatory Visit: Payer: Self-pay

## 2020-04-10 ENCOUNTER — Telehealth: Payer: Self-pay | Admitting: Oncology

## 2020-04-10 VITALS — BP 141/74 | HR 73 | Temp 98.4°F | Resp 18 | Ht 60.0 in | Wt 218.3 lb

## 2020-04-10 DIAGNOSIS — D819 Combined immunodeficiency, unspecified: Secondary | ICD-10-CM | POA: Diagnosis not present

## 2020-04-10 DIAGNOSIS — M170 Bilateral primary osteoarthritis of knee: Secondary | ICD-10-CM

## 2020-04-10 DIAGNOSIS — C9110 Chronic lymphocytic leukemia of B-cell type not having achieved remission: Secondary | ICD-10-CM

## 2020-04-10 DIAGNOSIS — Z96653 Presence of artificial knee joint, bilateral: Secondary | ICD-10-CM

## 2020-04-10 DIAGNOSIS — Z79899 Other long term (current) drug therapy: Secondary | ICD-10-CM | POA: Insufficient documentation

## 2020-04-10 LAB — CBC AND DIFFERENTIAL
HCT: 36 (ref 36–46)
Hemoglobin: 12.1 (ref 12.0–16.0)
Neutrophils Absolute: 1.25
Platelets: 59 — AB (ref 150–399)
WBC: 2.5

## 2020-04-10 LAB — HEPATIC FUNCTION PANEL
ALT: 22 (ref 7–35)
AST: 29 (ref 13–35)
Alkaline Phosphatase: 66 (ref 25–125)
Bilirubin, Total: 0.5

## 2020-04-10 LAB — BASIC METABOLIC PANEL
BUN: 17 (ref 4–21)
CO2: 26 — AB (ref 13–22)
Chloride: 108 (ref 99–108)
Creatinine: 0.9 (ref 0.5–1.1)
Glucose: 126
Potassium: 3.5 (ref 3.4–5.3)
Sodium: 140 (ref 137–147)

## 2020-04-10 LAB — COMPREHENSIVE METABOLIC PANEL
Albumin: 4.3 (ref 3.5–5.0)
Calcium: 9.3 (ref 8.7–10.7)

## 2020-04-10 LAB — CBC: RBC: 3.96 (ref 3.87–5.11)

## 2020-04-10 NOTE — Telephone Encounter (Signed)
Per 1/10 los next appt given to patient 

## 2020-04-11 ENCOUNTER — Inpatient Hospital Stay: Payer: Medicare Other

## 2020-04-11 VITALS — BP 168/77 | HR 67 | Temp 97.7°F | Resp 18 | Ht 60.0 in | Wt 220.2 lb

## 2020-04-11 DIAGNOSIS — D801 Nonfamilial hypogammaglobulinemia: Secondary | ICD-10-CM

## 2020-04-11 DIAGNOSIS — Z79899 Other long term (current) drug therapy: Secondary | ICD-10-CM | POA: Diagnosis not present

## 2020-04-11 DIAGNOSIS — D819 Combined immunodeficiency, unspecified: Secondary | ICD-10-CM | POA: Diagnosis present

## 2020-04-11 MED ORDER — DIPHENHYDRAMINE HCL 50 MG/ML IJ SOLN
INTRAMUSCULAR | Status: AC
  Filled 2020-04-11: qty 1

## 2020-04-11 MED ORDER — DIPHENHYDRAMINE HCL 50 MG/ML IJ SOLN
25.0000 mg | Freq: Once | INTRAMUSCULAR | Status: AC
  Administered 2020-04-11: 25 mg via INTRAVENOUS

## 2020-04-11 MED ORDER — IMMUNE GLOBULIN (HUMAN) 10 GM/100ML IV SOLN
1.0000 g/kg | Freq: Once | INTRAVENOUS | Status: AC
  Administered 2020-04-11: 45 g via INTRAVENOUS
  Filled 2020-04-11: qty 400

## 2020-04-11 MED ORDER — SODIUM CHLORIDE 0.9% FLUSH
10.0000 mL | Freq: Once | INTRAVENOUS | Status: AC | PRN
  Administered 2020-04-11: 10 mL
  Filled 2020-04-11: qty 10

## 2020-04-11 MED ORDER — ACETAMINOPHEN 325 MG PO TABS
ORAL_TABLET | ORAL | Status: AC
  Filled 2020-04-11: qty 2

## 2020-04-11 MED ORDER — ACETAMINOPHEN 325 MG PO TABS
650.0000 mg | ORAL_TABLET | Freq: Once | ORAL | Status: AC
  Administered 2020-04-11: 650 mg via ORAL

## 2020-04-11 MED ORDER — HEPARIN SOD (PORK) LOCK FLUSH 100 UNIT/ML IV SOLN
500.0000 [IU] | Freq: Once | INTRAVENOUS | Status: AC | PRN
  Administered 2020-04-11: 500 [IU]
  Filled 2020-04-11: qty 5

## 2020-04-11 MED ORDER — DEXTROSE 5 % IV SOLN
Freq: Once | INTRAVENOUS | Status: AC
  Filled 2020-04-11: qty 250

## 2020-04-11 NOTE — Patient Instructions (Signed)
Immune Globulin Injection What is this medicine? IMMUNE GLOBULIN (im MUNE GLOB yoo lin) helps to prevent or reduce the severity of certain infections in patients who are at risk. This medicine is collected from the pooled blood of many donors. It is used to treat immune system problems, thrombocytopenia, and Kawasaki syndrome. This medicine may be used for other purposes; ask your health care provider or pharmacist if you have questions. COMMON BRAND NAME(S): ASCENIV, Baygam, BIVIGAM, Carimune, Carimune NF, cutaquig, Cuvitru, Flebogamma, Flebogamma DIF, GamaSTAN, GamaSTAN S/D, Gamimune N, Gammagard, Gammagard S/D, Gammaked, Gammaplex, Gammar-P IV, Gamunex, Gamunex-C, Hizentra, Iveegam, Iveegam EN, Octagam, Panglobulin, Panglobulin NF, panzyga, Polygam S/D, Privigen, Sandoglobulin, Venoglobulin-S, Vigam, Vivaglobulin, Xembify What should I tell my health care provider before I take this medicine? They need to know if you have any of these conditions:  diabetes  extremely low or no immune antibodies in the blood  heart disease  history of blood clots  hyperprolinemia  infection in the blood, sepsis  kidney disease  recently received or scheduled to receive a vaccination  an unusual or allergic reaction to human immune globulin, albumin, maltose, sucrose, other medicines, foods, dyes, or preservatives  pregnant or trying to get pregnant  breast-feeding How should I use this medicine? This medicine is for injection into a muscle or infusion into a vein or skin. It is usually given by a health care professional in a hospital or clinic setting. In rare cases, some brands of this medicine might be given at home. You will be taught how to give this medicine. Use exactly as directed. Take your medicine at regular intervals. Do not take your medicine more often than directed. Talk to your pediatrician regarding the use of this medicine in children. While this drug may be prescribed for selected  conditions, precautions do apply. Overdosage: If you think you have taken too much of this medicine contact a poison control center or emergency room at once. NOTE: This medicine is only for you. Do not share this medicine with others. What if I miss a dose? It is important not to miss your dose. Call your doctor or health care professional if you are unable to keep an appointment. If you give yourself the medicine and you miss a dose, take it as soon as you can. If it is almost time for your next dose, take only that dose. Do not take double or extra doses. What may interact with this medicine?  aspirin and aspirin-like medicines  cisplatin  cyclosporine  medicines for infection like acyclovir, adefovir, amphotericin B, bacitracin, cidofovir, foscarnet, ganciclovir, gentamicin, pentamidine, vancomycin  NSAIDS, medicines for pain and inflammation, like ibuprofen or naproxen  pamidronate  vaccines  zoledronic acid This list may not describe all possible interactions. Give your health care provider a list of all the medicines, herbs, non-prescription drugs, or dietary supplements you use. Also tell them if you smoke, drink alcohol, or use illegal drugs. Some items may interact with your medicine. What should I watch for while using this medicine? Your condition will be monitored carefully while you are receiving this medicine. This medicine is made from pooled blood donations of many different people. It may be possible to pass an infection in this medicine. However, the donors are screened for infections and all products are tested for HIV and hepatitis. The medicine is treated to kill most or all bacteria and viruses. Talk to your doctor about the risks and benefits of this medicine. Do not have vaccinations for at least   14 days before, or until at least 3 months after receiving this medicine. What side effects may I notice from receiving this medicine? Side effects that you should report  to your doctor or health care professional as soon as possible:  allergic reactions like skin rash, itching or hives, swelling of the face, lips, or tongue  blue colored lips or skin  breathing problems  chest pain or tightness  fever  signs and symptoms of aseptic meningitis such as stiff neck; sensitivity to light; headache; drowsiness; fever; nausea; vomiting; rash  signs and symptoms of a blood clot such as chest pain; shortness of breath; pain, swelling, or warmth in the leg  signs and symptoms of hemolytic anemia such as fast heartbeat; tiredness; dark yellow or brown urine; or yellowing of the eyes or skin  signs and symptoms of kidney injury like trouble passing urine or change in the amount of urine  sudden weight gain  swelling of the ankles, feet, hands Side effects that usually do not require medical attention (report to your doctor or health care professional if they continue or are bothersome):  diarrhea  flushing  headache  increased sweating  joint pain  muscle cramps  muscle pain  nausea  pain, redness, or irritation at site where injected  tiredness This list may not describe all possible side effects. Call your doctor for medical advice about side effects. You may report side effects to FDA at 1-800-FDA-1088. Where should I keep my medicine? Keep out of the reach of children. This drug is usually given in a hospital or clinic and will not be stored at home. In rare cases, some brands of this medicine may be given at home. If you are using this medicine at home, you will be instructed on how to store this medicine. Throw away any unused medicine after the expiration date on the label. NOTE: This sheet is a summary. It may not cover all possible information. If you have questions about this medicine, talk to your doctor, pharmacist, or health care provider.  2021 Elsevier/Gold Standard (2018-10-21 12:51:14)  

## 2020-04-11 NOTE — Progress Notes (Signed)
1252: PT STABLE AT TIME OF DISCHARGE

## 2020-05-08 ENCOUNTER — Inpatient Hospital Stay: Payer: Medicare Other | Attending: Oncology | Admitting: Hematology and Oncology

## 2020-05-08 ENCOUNTER — Other Ambulatory Visit: Payer: Self-pay

## 2020-05-08 ENCOUNTER — Telehealth: Payer: Self-pay | Admitting: Hematology and Oncology

## 2020-05-08 ENCOUNTER — Inpatient Hospital Stay: Payer: Medicare Other

## 2020-05-08 ENCOUNTER — Encounter: Payer: Self-pay | Admitting: Hematology and Oncology

## 2020-05-08 VITALS — BP 175/83 | HR 75 | Temp 98.4°F | Resp 20 | Ht 60.0 in | Wt 221.1 lb

## 2020-05-08 DIAGNOSIS — Z79899 Other long term (current) drug therapy: Secondary | ICD-10-CM | POA: Insufficient documentation

## 2020-05-08 DIAGNOSIS — C9110 Chronic lymphocytic leukemia of B-cell type not having achieved remission: Secondary | ICD-10-CM

## 2020-05-08 DIAGNOSIS — N644 Mastodynia: Secondary | ICD-10-CM

## 2020-05-08 DIAGNOSIS — D819 Combined immunodeficiency, unspecified: Secondary | ICD-10-CM | POA: Insufficient documentation

## 2020-05-08 LAB — HEPATIC FUNCTION PANEL
ALT: 27 (ref 7–35)
AST: 30 (ref 13–35)
Alkaline Phosphatase: 71 (ref 25–125)
Bilirubin, Total: 0.5

## 2020-05-08 LAB — BASIC METABOLIC PANEL
BUN: 18 (ref 4–21)
CO2: 26 — AB (ref 13–22)
Chloride: 107 (ref 99–108)
Creatinine: 0.8 (ref 0.5–1.1)
Glucose: 115
Potassium: 4 (ref 3.4–5.3)
Sodium: 141 (ref 137–147)

## 2020-05-08 LAB — CBC AND DIFFERENTIAL
HCT: 35 — AB (ref 36–46)
Hemoglobin: 11.8 — AB (ref 12.0–16.0)
Neutrophils Absolute: 1.1
Platelets: 55 — AB (ref 150–399)
WBC: 2.4

## 2020-05-08 LAB — CBC
MCV: 90 (ref 81–99)
RBC: 3.86 — AB (ref 3.87–5.11)

## 2020-05-08 LAB — COMPREHENSIVE METABOLIC PANEL
Albumin: 4.1 (ref 3.5–5.0)
Calcium: 8.6 — AB (ref 8.7–10.7)

## 2020-05-08 NOTE — Telephone Encounter (Signed)
Per 2/7 LOS, patient scheduled for March Labs, Follow Up w/Dr Hinton Rao, Infusion.  Also scheduled patient for Rt Mammo, Ultrasound for Feb 17

## 2020-05-08 NOTE — Progress Notes (Signed)
[facility]  [address] 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  Current Treatment:  Venetoclax 400mg  daily and monthly IVIG   HISTORY OF PRESENT ILLNESS:  Samantha Warren is a 75 y.o. 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  She was on observation until November 2008. She was initially treated with chlorambucil and prednisone, as she refused intravenous chemotherapy.  She had a partial response to this regimen.  By January 2010, she had progressive disease with a white count over 200,000, with associated anemia, splenomegaly, thrombocytopenia, and adenopathy.  She then received 5 cycles of fludarabine with good response.  She did well until January 2012, when she had progression once again.  She had a single dose of bendamustine and rituximab, which kept her disease under control for over a year.  However, she had a severe allergic reaction to the rituximab, so has not received further rituximab.  She was hospitalized after the bendamustine because of severe pancytopenia and required multiple transfusions, so was placed on observation.  In March 2013, she had progression of disease again, so was treated with bendamustine for 6 cycles, at a 50% dose reduction, and once again had a excellent response.  She was on observation until August 2014, then had progression of disease, so was placed on ibrutinib 420 mg daily.  The ibrutinib had kept her disease under fairly good control, but then she develops severe bilateral lower extremity cellulitis in April 2016, so ibrutinib was placed on hold.  She had persistent cellulitis, as well as  Clostridium difficile colitis in May 2016 requiring hospitalization.  She was readmitted soon after discharge with worsening  cellulitis, requiring a prolonged hospitalization.  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 She eventually had resolution of the cellulitis  As she was largely asymptomatic, we continued observation until February 2017, at which time she had worsening splenomegaly.  Repeat CT imaging in February showed progressive disease, with marked splenomegaly and bulky lymphadenopathy.  She was then placed back on ibrutinib 420mg  daily.  The lymphocytosis, anemia and thrombocytopenia slowly improved.  Due to neutropenia, ibrutinib was placed on hold in December 2017.  The ibrutinib was resumed in January 2018, but discontinued in August 2018 due to toxicities, mainly severe abdominal muscle cramping.  She was hospitalized in early September 2018 with urinary tract infection and pancytopenia.  She received multiple red blood cell and platelet transfusions during her stay.  She was admitted again in late September 2018 with perineal cellulitis extending to the lower abdomen and upper thigh, as well as persistent pancytopenia.  CT abdomen and pelvis revealed interval decrease in the abdominal lymphadenopathy, chronic massive splenomegaly with scattered small splenic infarcts, and small left pleural effusion with bibasilar atelectasis.  She required packed red blood cells while hospitalized.  She was discharged home with home health and followed up with the [facility].  We continued to follow her closely, but did not place her on a treatment due to the persistent cellulitis.  She then was admitted in October 2018 with severe hypercalcemia, with a calcium of 15.  She was treated with IV fluids, zoledronic acid and Calcitonin injections.  We continued to monitor her closely.  She had recurrent hypercalcemia,  for which she received IV fluids and zolendronic acid as an outpatient.    Due to worsening pancytopenia, she underwent bone marrow  biopsy in January 2019.   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  The cellulitis had improved to a point we felt she could be treated again, so she was placed on venetoclax CLL in January 2019.  She has continued on venetoclax 400 mg daily after ramp up dosing and has tolerated that fairly well, except for mild diarrhea.  Since starting venetoclax, she has had decreasing splenomegaly and improvement in her pancytopenia.  She initially continued to require IV fluids and zoledronic acid for the hypercalcemia.  Her last dose of zoledronic acid was given in March 2019.  In March, she had an irregular heart rhythm and EKG revealed occasional premature atrial complexes, as well as an incomplete right bundle branch block.  This was stable compared to EKG done in October 2018.  Quantitative immunoglobulins done in August 2019 revealed a normal IgG, with low IgA and IgM.  She has continued IVIG monthly.  She had an episode of Campylobacter diarrhea in November 2019, which was treated with azithromycin for 7 days with resolution of her diarrhea.  She was evaluated by GI who recommended colonoscopy in May of next year.  Venetoclax was held temporarily until her diarrhea resolved.  She has since tolerated venetoclax without significant difficulty.    She completed a full 2 years of monotherapy with venetoclax in January 2021.  We recommended continuing venetoclax 400 mg daily until progression of disease, or unacceptable toxicities.  Annual bilateral mammogram in Apri 2021l revealed a possible mass in the left breast, which warranted further evaluation.  Right breast did not reveal any evidence of malignancy.  Unilateral diagnostic left mammogram in May, revealed benign fibrocystic change within the outer left breast with no evidence of malignancy.  Routine annual bilateral mammography was recommended.   INTERVAL HISTORY:  Samantha Warren is here prior to  monthly IVIG.  She states continues venetoclax 400 mg daily without significant difficulty. She denies missed doses.  She has persistent arthralgias of the bilateral knees, right greater than left.  She is on oxycodone as needed.  She reports right breast pain for several weeks. She denies palpable masses or  nipple discharge.  She denies fevers, chills or night sweats.  She denies fatigue.  Her  appetite is good.  Her weight has been stable.    REVIEW OF SYSTEMS:  Review of Systems  Constitutional: Negative for appetite change, chills, fatigue, fever and unexpected weight change.  HENT:   Negative for lump/mass, mouth sores and sore throat.   Respiratory: Negative for cough and shortness of breath.   Cardiovascular: Negative for chest pain and leg swelling.  Gastrointestinal: Negative for abdominal pain, constipation, diarrhea, nausea and vomiting.  Genitourinary: Negative for difficulty urinating, dysuria, frequency and hematuria.   Musculoskeletal: Positive for arthralgias (bilateral knees, chronic). Negative for back pain and myalgias.  Skin: Negative for rash.  Neurological: Negative for dizziness and headaches.  Hematological: Negative for adenopathy. Does not bruise/bleed easily.  Psychiatric/Behavioral: Negative for depression and sleep disturbance. The patient is not nervous/anxious.      VITALS:  Blood pressure (!) 175/83, pulse 75, temperature 98.4 F (36.9 C), temperature source Oral, resp. rate 20, height 5' (1.524 m), weight 221 lb 1.6 oz (100.3 kg), SpO2 97 %.  Wt Readings from Last 3 Encounters:  05/08/20 221 lb 1.6 oz (100.3 kg)  04/11/20 220 lb 4 oz (99.9 kg)  04/10/20 218 lb 4.8 oz (99 kg)    Body mass index is 43.18 kg/m.  Performance status (ECOG): 1 - Symptomatic but completely ambulatory  PHYSICAL EXAM:  Physical Exam Vitals and nursing note reviewed.  Constitutional:      General: She is not in acute distress.    Appearance: Normal appearance.  HENT:      Mouth/Throat:     Mouth: Mucous membranes are moist.     Pharynx: Oropharynx is clear. No oropharyngeal exudate or posterior oropharyngeal erythema.  Eyes:     General: No scleral icterus.    Extraocular Movements: Extraocular movements intact.     Conjunctiva/sclera: Conjunctivae normal.     Pupils: Pupils are equal, round, and reactive to light.  Cardiovascular:     Rate and Rhythm: Normal rate and regular rhythm.     Pulses: Normal pulses.     Heart sounds: Normal heart sounds. No murmur heard. No friction rub. No gallop.   Pulmonary:     Effort: Pulmonary effort is normal. No respiratory distress.     Breath sounds: Normal breath sounds. No stridor. No wheezing, rhonchi or rales.  Chest:  Breasts:     Right: Tenderness (subareolar) present. No swelling, bleeding, inverted nipple, mass, nipple discharge, skin change, axillary adenopathy or supraclavicular adenopathy.     Left: Normal. No swelling, bleeding, inverted nipple, mass, nipple discharge, skin change, tenderness, axillary adenopathy or supraclavicular adenopathy.    Abdominal:     General: There is no distension.     Palpations: Abdomen is soft. There is no hepatomegaly, splenomegaly or mass.     Tenderness: There is no abdominal tenderness. There is no guarding.     Hernia: No hernia is present.  Musculoskeletal:        General: Normal range of motion.     Cervical back: Normal range of motion and neck supple. No tenderness.     Right lower leg: No edema.     Left lower leg: No edema.  Lymphadenopathy:     Cervical: No cervical adenopathy.     Upper Body:     Right upper body: No supraclavicular or axillary adenopathy.     Left upper body: No supraclavicular or axillary adenopathy.     Lower Body: No right inguinal adenopathy. No left inguinal adenopathy.  Skin:    General: Skin is warm and dry.     Coloration: Skin is not jaundiced.     Findings: No rash.  Neurological:     Mental Status: She is alert and  oriented to person, place, and time.     Cranial Nerves: No cranial nerve deficit.  Psychiatric:        Mood and Affect: Mood normal.        Behavior: Behavior normal.        Thought Content: Thought content normal.    LABS:   CBC Latest Ref Rng & Units 05/08/2020 04/10/2020 03/10/2020  WBC - 2.4 2.5 3.0  Hemoglobin 12.0 - 16.0 11.8(A) 12.1 12.6  Hematocrit 36 - 46 35(A) 36 38  Platelets 150 - 399 55(A) 59(A) 85(A)   CMP Latest Ref Rng & Units 05/08/2020 04/10/2020 03/10/2020  BUN 4 - 21 18 17 16   Creatinine 0.5 - 1.1 0.8 0.9 1.0  Sodium 137 - 147 141 140 143  Potassium 3.4 - 5.3 4.0 3.5 3.5  Chloride 99 - 108 107 108 109(A)  CO2 13 - 22 26(A) 26(A)  23(A)  Calcium 8.7 - 10.7 8.6(A) 9.3 9.5  Alkaline Phos 25 - 125 71 66 78  AST 13 - 35 30 29 25   ALT 7 - 35 27 22 22     STUDIES:  No results found.   Allergies:  Allergies  Allergen Reactions  . Metronidazole   . Rituximab   . Sulfa Antibiotics     Current Medications: Current Outpatient Medications  Medication Sig Dispense Refill  . alendronate (FOSAMAX) 70 MG tablet     . Ascorbic Acid (VITAMIN C WITH ROSE HIPS) 500 MG tablet Take 500 mg by mouth daily.    . benzonatate (TESSALON) 200 MG capsule TAKE ONE CAPSULE 3 TIMES A DAY AS NEEDED FOR COUGH  5  . cyclobenzaprine (FLEXERIL) 5 MG tablet     . diclofenac Sodium (VOLTAREN) 1 % GEL Apply topically.    . hydrochlorothiazide (MICROZIDE) 12.5 MG capsule Take by mouth.    Marland Kitchen KLOR-CON M20 20 MEQ tablet     . lidocaine (LIDODERM) 5 % 2 patches daily.    Marland Kitchen lidocaine-prilocaine (EMLA) cream Apply 1 application topically as needed. 30 g 0  . NEOMYCIN-POLYMYXIN-HYDROCORTISONE (CORTISPORIN) 1 % SOLN otic solution     . omeprazole (PRILOSEC) 20 MG capsule Take 1 capsule by mouth daily.    Marland Kitchen oxyCODONE (ROXICODONE) 15 MG immediate release tablet Take 15 mg by mouth every 6 (six) hours as needed.    . prochlorperazine (COMPAZINE) 10 MG tablet     . VENCLEXTA 100 MG tablet Take 400 mg  by mouth daily.    . Vibegron (GEMTESA) 75 MG TABS Take 75 mg by mouth at bedtime.      No current facility-administered medications for this visit.     ASSESSMENT & PLAN:   Assessment:   1. CLL, well controlled with venetoclax 400mg  daily, which she will continue. Her blood counts remain fairly stable.  2. 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    3. 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  4.  New right breast subareolar tenderness without palpable masses.  I will arrange for her to have a right diagnostic mammogram and ultrasound for further evaluation.  Plan:  Due to the new right breast tenderness, I will arrange for her to have a right diagnostic mammogram and ultrasound.  We will proceed with IVIG infusion on February 8th.  She knows to continue venetoclax 400 mg daily.  We will plan to see in 4 weeks with a CBC and comprehensive metabolic panel prior to her next IVIG.  The patient understands the plans discussed today and is in agreement with them.  She knows to contact our office if she develops issues requiring immediate clinical assessment.  I provided 30 minutes of face-to-face time during this this encounter and > 50% was spent counseling as documented under my assessment and plan.  [name], PA-C [facility] AT [facility] [address] [place] [address] Dept: [phone] Dept Fax: [phone]

## 2020-05-09 ENCOUNTER — Inpatient Hospital Stay: Payer: Medicare Other

## 2020-05-09 VITALS — BP 141/63 | HR 65 | Temp 97.7°F | Resp 16 | Ht 60.0 in | Wt 219.5 lb

## 2020-05-09 DIAGNOSIS — D801 Nonfamilial hypogammaglobulinemia: Secondary | ICD-10-CM

## 2020-05-09 DIAGNOSIS — D819 Combined immunodeficiency, unspecified: Secondary | ICD-10-CM | POA: Diagnosis present

## 2020-05-09 DIAGNOSIS — Z79899 Other long term (current) drug therapy: Secondary | ICD-10-CM | POA: Diagnosis not present

## 2020-05-09 MED ORDER — DEXTROSE 5 % IV SOLN
Freq: Once | INTRAVENOUS | Status: AC
  Filled 2020-05-09: qty 250

## 2020-05-09 MED ORDER — DIPHENHYDRAMINE HCL 50 MG/ML IJ SOLN
25.0000 mg | Freq: Once | INTRAMUSCULAR | Status: AC
  Administered 2020-05-09: 25 mg via INTRAVENOUS

## 2020-05-09 MED ORDER — DIPHENHYDRAMINE HCL 50 MG/ML IJ SOLN
INTRAMUSCULAR | Status: AC
  Filled 2020-05-09: qty 1

## 2020-05-09 MED ORDER — HEPARIN SOD (PORK) LOCK FLUSH 100 UNIT/ML IV SOLN
500.0000 [IU] | Freq: Once | INTRAVENOUS | Status: AC | PRN
  Administered 2020-05-09: 500 [IU]
  Filled 2020-05-09: qty 5

## 2020-05-09 MED ORDER — IMMUNE GLOBULIN (HUMAN) 10 GM/100ML IV SOLN
1.0000 g/kg | Freq: Once | INTRAVENOUS | Status: AC
  Administered 2020-05-09: 45 g via INTRAVENOUS
  Filled 2020-05-09: qty 400

## 2020-05-09 MED ORDER — ACETAMINOPHEN 325 MG PO TABS
650.0000 mg | ORAL_TABLET | Freq: Once | ORAL | Status: AC
Start: 2020-05-09 — End: 2020-05-09
  Administered 2020-05-09: 650 mg via ORAL

## 2020-05-09 MED ORDER — ACETAMINOPHEN 325 MG PO TABS
ORAL_TABLET | ORAL | Status: AC
  Filled 2020-05-09: qty 2

## 2020-05-09 NOTE — Patient Instructions (Signed)
Immune Globulin Injection What is this medicine? IMMUNE GLOBULIN (im MUNE GLOB yoo lin) helps to prevent or reduce the severity of certain infections in patients who are at risk. This medicine is collected from the pooled blood of many donors. It is used to treat immune system problems, thrombocytopenia, and Kawasaki syndrome. This medicine may be used for other purposes; ask your health care provider or pharmacist if you have questions. COMMON BRAND NAME(S): ASCENIV, Baygam, BIVIGAM, Carimune, Carimune NF, cutaquig, Cuvitru, Flebogamma, Flebogamma DIF, GamaSTAN, GamaSTAN S/D, Gamimune N, Gammagard, Gammagard S/D, Gammaked, Gammaplex, Gammar-P IV, Gamunex, Gamunex-C, Hizentra, Iveegam, Iveegam EN, Octagam, Panglobulin, Panglobulin NF, panzyga, Polygam S/D, Privigen, Sandoglobulin, Venoglobulin-S, Vigam, Vivaglobulin, Xembify What should I tell my health care provider before I take this medicine? They need to know if you have any of these conditions:  diabetes  extremely low or no immune antibodies in the blood  heart disease  history of blood clots  hyperprolinemia  infection in the blood, sepsis  kidney disease  recently received or scheduled to receive a vaccination  an unusual or allergic reaction to human immune globulin, albumin, maltose, sucrose, other medicines, foods, dyes, or preservatives  pregnant or trying to get pregnant  breast-feeding How should I use this medicine? This medicine is for injection into a muscle or infusion into a vein or skin. It is usually given by a health care professional in a hospital or clinic setting. In rare cases, some brands of this medicine might be given at home. You will be taught how to give this medicine. Use exactly as directed. Take your medicine at regular intervals. Do not take your medicine more often than directed. Talk to your pediatrician regarding the use of this medicine in children. While this drug may be prescribed for selected  conditions, precautions do apply. Overdosage: If you think you have taken too much of this medicine contact a poison control center or emergency room at once. NOTE: This medicine is only for you. Do not share this medicine with others. What if I miss a dose? It is important not to miss your dose. Call your doctor or health care professional if you are unable to keep an appointment. If you give yourself the medicine and you miss a dose, take it as soon as you can. If it is almost time for your next dose, take only that dose. Do not take double or extra doses. What may interact with this medicine?  aspirin and aspirin-like medicines  cisplatin  cyclosporine  medicines for infection like acyclovir, adefovir, amphotericin B, bacitracin, cidofovir, foscarnet, ganciclovir, gentamicin, pentamidine, vancomycin  NSAIDS, medicines for pain and inflammation, like ibuprofen or naproxen  pamidronate  vaccines  zoledronic acid This list may not describe all possible interactions. Give your health care provider a list of all the medicines, herbs, non-prescription drugs, or dietary supplements you use. Also tell them if you smoke, drink alcohol, or use illegal drugs. Some items may interact with your medicine. What should I watch for while using this medicine? Your condition will be monitored carefully while you are receiving this medicine. This medicine is made from pooled blood donations of many different people. It may be possible to pass an infection in this medicine. However, the donors are screened for infections and all products are tested for HIV and hepatitis. The medicine is treated to kill most or all bacteria and viruses. Talk to your doctor about the risks and benefits of this medicine. Do not have vaccinations for at least   14 days before, or until at least 3 months after receiving this medicine. What side effects may I notice from receiving this medicine? Side effects that you should report  to your doctor or health care professional as soon as possible:  allergic reactions like skin rash, itching or hives, swelling of the face, lips, or tongue  blue colored lips or skin  breathing problems  chest pain or tightness  fever  signs and symptoms of aseptic meningitis such as stiff neck; sensitivity to light; headache; drowsiness; fever; nausea; vomiting; rash  signs and symptoms of a blood clot such as chest pain; shortness of breath; pain, swelling, or warmth in the leg  signs and symptoms of hemolytic anemia such as fast heartbeat; tiredness; dark yellow or brown urine; or yellowing of the eyes or skin  signs and symptoms of kidney injury like trouble passing urine or change in the amount of urine  sudden weight gain  swelling of the ankles, feet, hands Side effects that usually do not require medical attention (report to your doctor or health care professional if they continue or are bothersome):  diarrhea  flushing  headache  increased sweating  joint pain  muscle cramps  muscle pain  nausea  pain, redness, or irritation at site where injected  tiredness This list may not describe all possible side effects. Call your doctor for medical advice about side effects. You may report side effects to FDA at 1-800-FDA-1088. Where should I keep my medicine? Keep out of the reach of children. This drug is usually given in a hospital or clinic and will not be stored at home. In rare cases, some brands of this medicine may be given at home. If you are using this medicine at home, you will be instructed on how to store this medicine. Throw away any unused medicine after the expiration date on the label. NOTE: This sheet is a summary. It may not cover all possible information. If you have questions about this medicine, talk to your doctor, pharmacist, or health care provider.  2021 Elsevier/Gold Standard (2018-10-21 12:51:14)  

## 2020-05-15 ENCOUNTER — Encounter: Payer: Self-pay | Admitting: Hematology and Oncology

## 2020-05-29 ENCOUNTER — Other Ambulatory Visit: Payer: Self-pay | Admitting: Pharmacist

## 2020-06-01 NOTE — Progress Notes (Signed)
Patient was approved for free Venclexta through [facility]. KG#[id number]

## 2020-06-02 NOTE — Progress Notes (Signed)
[facility]  [address] [place],  [place]  [address] [phone]  Clinic Day:  06/05/2020  Referring physician: [name], MD   This document serves as a record of services personally performed by Hosie Poisson, MD. It was created on their behalf by [facility], a trained medical scribe. The creation of this record is based on the scribe's personal observations and the provider's statements to them.  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  Current Treatment:  Venetoclax 437m daily and monthly IVIG  HISTORY OF PRESENT ILLNESS:  LTerissa Haffeyis a 75y.o. 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  She was on observation until November 2008. She was initially treated with chlorambucil and prednisone, as she refused intravenous chemotherapy.  She had a partial response to this regimen.  By January 2010, she had progressive disease with a white count over 200,000, with associated anemia, splenomegaly, thrombocytopenia, and adenopathy.  She then received 5 cycles of fludarabine with good response.  She did well until January 2012, when she had progression once again.  She had a single dose of bendamustine and rituximab, which kept her disease under control for over a year.  However, she had a severe allergic reaction to the rituximab, so has not received further rituximab.  She was hospitalized after the bendamustine because of severe pancytopenia and required multiple transfusions, so was placed on observation.  In March 2013, she had progression of disease again, so was treated with bendamustine for 6 cycles, at a 50% dose reduction, and once again had a excellent response.  She was on observation until August 2014, then had progression of disease, so was placed on ibrutinib 420 mg daily.  The ibrutinib had kept her disease under fairly good control, but  then she develops severe bilateral lower extremity cellulitis in April 2016, so ibrutinib was placed on hold.  She had persistent cellulitis, as well as  Clostridium difficile colitis in May 2016 requiring hospitalization.  She was readmitted soon after discharge with worsening cellulitis, requiring a prolonged hospitalization.  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 She eventually had resolution of the cellulitis  As she was largely asymptomatic, we continued observation until February 2017, at which time she had worsening splenomegaly.  Repeat CT imaging in February showed progressive disease, with marked splenomegaly and bulky lymphadenopathy.  She was then placed back on ibrutinib 4259mdaily.  The lymphocytosis, anemia and thrombocytopenia slowly improved.  Due to neutropenia, ibrutinib was placed on hold in December 2017.  The ibrutinib was resumed in January 2018, but discontinued in August 2018 due to toxicities, mainly severe abdominal muscle cramping.  She was hospitalized in early September 2018 with urinary tract infection and pancytopenia.  She received multiple red blood cell and platelet transfusions during her stay.  She was admitted again in late September 2018 with perineal cellulitis extending to the lower abdomen and upper thigh, as well as persistent pancytopenia.  CT abdomen and pelvis revealed interval decrease in the abdominal lymphadenopathy, chronic massive splenomegaly with scattered small splenic infarcts, and small left pleural effusion with bibasilar atelectasis.  She required packed red blood cells while hospitalized.  She was discharged home with home health and followed up with the [facility] We continued to follow her closely, but did not place her on a treatment due  to the persistent cellulitis.  She then was admitted in October 2018 with severe hypercalcemia, with a calcium of 15.  She  was treated with IV fluids, zoledronic acid and Calcitonin injections.  We continued to monitor her closely.  She had recurrent hypercalcemia, for which she received IV fluids and zolendronic acid as an outpatient.    Due to worsening pancytopenia, she underwent bone marrow biopsy in January 2019.   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  The cellulitis had improved to a point we felt she could be treated again, so she was placed on venetoclax CLL in January 2019.  She has continued on venetoclax 400 mg daily after ramp up dosing and has tolerated that fairly well, except for mild diarrhea.  Since starting venetoclax, she has had decreasing splenomegaly and improvement in her pancytopenia.  She initially continued to require IV fluids and zoledronic acid for the hypercalcemia.  Her last dose of zoledronic acid was given in March 2019.  In March, she had an irregular heart rhythm and EKG revealed occasional premature atrial complexes, as well as an incomplete right bundle branch block.  This was stable compared to EKG done in October 2018.  Quantitative immunoglobulins done in August 2019 revealed a normal IgG, with low IgA and IgM.  She has continued IVIG monthly.  She had an episode of Campylobacter diarrhea in November 2019, which was treated with azithromycin for 7 days with resolution of her diarrhea.  She was evaluated by GI who recommended colonoscopy in May of next year.  Venetoclax was held temporarily until her diarrhea resolved.  She has since tolerated venetoclax without significant difficulty.    She completed a full 2 years of monotherapy with venetoclax in January 2021.  We recommended continuing venetoclax 400 mg daily until progression of disease, or unacceptable toxicities.  Annual bilateral mammogram in Apri 2021l revealed a possible mass in the left breast, which warranted further evaluation.  Right breast did not  reveal any evidence of malignancy.  Unilateral diagnostic left mammogram in May, revealed benign fibrocystic change within the outer left breast with no evidence of malignancy.  Routine annual bilateral mammography was recommended.  In February she had a right mammogram due to right breast subareolar tenderness, but it was negative.  She will still be due for bilateral screening mammogram in April/May.  INTERVAL HISTORY:  Samantha Warren is here for routine follow up prior to monthly IVIG.  She continues venetoclax 400 mg daily without significant difficulty.  She states that she is doing well and denies complaints today other than some arthralgias.  I will refill her omeprazole 20 mg daily and also send in a prescription for Emla cream.  Her white count has improved from 2.4 to 2.9 with an [place] of 1420, her hemoglobin is fairly stable at 11.6, and her platelet count has mildly increased from 55,000 to 64,000.  Chemistries are unremarkable except for a BUN of 21.  Her  appetite is good, and she has lost 2 pounds since her last visit.  She denies fever, chills or other signs of infection.  She denies nausea, vomiting, bowel issues, or abdominal pain.  She denies sore throat, cough, dyspnea, or chest pain.   REVIEW OF SYSTEMS:  Review of Systems  Constitutional: Negative.  Negative for appetite change, chills, fatigue, fever and unexpected weight change.  HENT:  Negative.   Eyes: Negative.   Respiratory: Negative.  Negative for chest tightness, cough, hemoptysis, shortness  of breath and wheezing.   Cardiovascular: Negative.  Negative for chest pain, leg swelling and palpitations.  Gastrointestinal: Negative.  Negative for abdominal distention, abdominal pain, blood in stool, constipation, diarrhea, nausea and vomiting.  Endocrine: Negative.   Genitourinary: Negative.  Negative for difficulty urinating, dysuria, frequency and hematuria.   Musculoskeletal: Positive for arthralgias. Negative for back pain, flank  pain, gait problem and myalgias.  Skin: Negative.   Neurological: Negative.  Negative for dizziness, extremity weakness, gait problem, headaches, light-headedness, numbness, seizures and speech difficulty.  Hematological: Negative.   Psychiatric/Behavioral: Negative.  Negative for depression and sleep disturbance. The patient is not nervous/anxious.      VITALS:  Blood pressure (!) 162/79, pulse 78, temperature 98.5 F (36.9 C), temperature source Oral, resp. rate 18, height 5' (1.524 m), weight 218 lb 14.4 oz (99.3 kg), SpO2 99 %.  Wt Readings from Last 3 Encounters:  06/05/20 218 lb 14.4 oz (99.3 kg)  05/09/20 219 lb 8 oz (99.6 kg)  05/08/20 221 lb 1.6 oz (100.3 kg)    Body mass index is 42.75 kg/m.  Performance status (ECOG): 1 - Symptomatic but completely ambulatory  PHYSICAL EXAM:  Physical Exam Constitutional:      General: She is not in acute distress.    Appearance: Normal appearance. She is normal weight.  HENT:     Head: Normocephalic and atraumatic.  Eyes:     General: No scleral icterus.    Extraocular Movements: Extraocular movements intact.     Conjunctiva/sclera: Conjunctivae normal.     Pupils: Pupils are equal, round, and reactive to light.  Cardiovascular:     Rate and Rhythm: Normal rate and regular rhythm.     Pulses: Normal pulses.     Heart sounds: Normal heart sounds. No murmur heard. No friction rub. No gallop.   Pulmonary:     Effort: Pulmonary effort is normal. No respiratory distress.     Breath sounds: Normal breath sounds.  Abdominal:     General: Bowel sounds are normal. There is no distension.     Palpations: Abdomen is soft. There is no hepatomegaly, splenomegaly or mass.     Tenderness: There is no abdominal tenderness.  Musculoskeletal:        General: Normal range of motion.     Cervical back: Normal range of motion and neck supple.     Right lower leg: No edema.     Left lower leg: No edema.  Lymphadenopathy:     Cervical: No  cervical adenopathy.  Skin:    General: Skin is warm and dry.  Neurological:     General: No focal deficit present.     Mental Status: She is alert and oriented to person, place, and time. Mental status is at baseline.  Psychiatric:        Mood and Affect: Mood normal.        Behavior: Behavior normal.        Thought Content: Thought content normal.        Judgment: Judgment normal.    LABS:   CBC Latest Ref Rng & Units 05/08/2020 04/10/2020 03/10/2020  WBC - 2.4 2.5 3.0  Hemoglobin 12.0 - 16.0 11.8(A) 12.1 12.6  Hematocrit 36 - 46 35(A) 36 38  Platelets 150 - 399 55(A) 59(A) 85(A)   CMP Latest Ref Rng & Units 05/08/2020 04/10/2020 03/10/2020  BUN 4 - 21 18 17 16   Creatinine 0.5 - 1.1 0.8 0.9 1.0  Sodium 137 - 147 141 140  143  Potassium 3.4 - 5.3 4.0 3.5 3.5  Chloride 99 - 108 107 108 109(A)  CO2 13 - 22 26(A) 26(A) 23(A)  Calcium 8.7 - 10.7 8.6(A) 9.3 9.5  Alkaline Phos 25 - 125 71 66 78  AST 13 - 35 30 29 25   ALT 7 - 35 27 22 22     STUDIES:   No current studies  Allergies:  Allergies  Allergen Reactions  . Metronidazole   . Rituximab   . Sulfa Antibiotics     Current Medications: Current Outpatient Medications  Medication Sig Dispense Refill  . alendronate (FOSAMAX) 70 MG tablet     . Ascorbic Acid (VITAMIN C WITH ROSE HIPS) 500 MG tablet Take 500 mg by mouth daily.    . benzonatate (TESSALON) 200 MG capsule TAKE ONE CAPSULE 3 TIMES A DAY AS NEEDED FOR COUGH  5  . cyclobenzaprine (FLEXERIL) 5 MG tablet     . diclofenac Sodium (VOLTAREN) 1 % GEL Apply topically.    . hydrochlorothiazide (MICROZIDE) 12.5 MG capsule Take by mouth.    Marland Kitchen KLOR-CON M20 20 MEQ tablet     . lidocaine (LIDODERM) 5 % 2 patches daily.    Marland Kitchen lidocaine-prilocaine (EMLA) cream Apply 1 application topically as needed. 30 g 0  . NEOMYCIN-POLYMYXIN-HYDROCORTISONE (CORTISPORIN) 1 % SOLN otic solution     . omeprazole (PRILOSEC) 20 MG capsule Take 1 capsule by mouth daily.    Marland Kitchen oxyCODONE  (ROXICODONE) 15 MG immediate release tablet Take 15 mg by mouth every 6 (six) hours as needed.    . prochlorperazine (COMPAZINE) 10 MG tablet     . VENCLEXTA 100 MG tablet Take 400 mg by mouth daily.    . Vibegron (GEMTESA) 75 MG TABS Take 75 mg by mouth at bedtime.      No current facility-administered medications for this visit.     ASSESSMENT & PLAN:   Assessment:   1. CLL, well controlled with venetoclax 418m daily, which she will continue. Her blood counts remain fairly stable.  2. 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    3. 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  4.  Right breast subareolar tenderness without palpable masses.  Diagnostic mammogram was negative.  She did not complain of this today.  She will be due for annual bilateral screening mammogram in April/May.  If Dr. LTruman Haywarddoes not arrange this, we will be glad to do so.  Plan:   She will proceed with IVIG infusion on March 8th.  She knows to continue venetoclax 400 mg daily.  We will plan to see in 4 weeks with a CBC and comprehensive metabolic panel prior to her next IVIG.  The patient understands the plans discussed today and is in agreement with them.  She knows to contact our office if she develops issues requiring immediate clinical assessment.  I provided 20 minutes of face-to-face time during this this encounter and > 50% was spent counseling as documented under my assessment and plan.  CDerwood Kaplan MD [facility] LLCAT [facility]Dept: [phone] Fax: [phone]  I, LRita Ohara am acting as scribe for CDerwood Kaplan MD  I have reviewed this report as typed by the medical scribe, and it is complete and accurate.

## 2020-06-04 ENCOUNTER — Other Ambulatory Visit: Payer: Self-pay | Admitting: Oncology

## 2020-06-04 DIAGNOSIS — C9110 Chronic lymphocytic leukemia of B-cell type not having achieved remission: Secondary | ICD-10-CM

## 2020-06-05 ENCOUNTER — Inpatient Hospital Stay: Payer: Medicare Other | Attending: Oncology

## 2020-06-05 ENCOUNTER — Other Ambulatory Visit: Payer: Self-pay | Admitting: Hematology and Oncology

## 2020-06-05 ENCOUNTER — Encounter: Payer: Self-pay | Admitting: Oncology

## 2020-06-05 ENCOUNTER — Other Ambulatory Visit: Payer: Self-pay

## 2020-06-05 ENCOUNTER — Other Ambulatory Visit: Payer: Self-pay | Admitting: Oncology

## 2020-06-05 ENCOUNTER — Inpatient Hospital Stay (INDEPENDENT_AMBULATORY_CARE_PROVIDER_SITE_OTHER): Payer: Medicare Other | Admitting: Oncology

## 2020-06-05 ENCOUNTER — Telehealth: Payer: Self-pay | Admitting: Oncology

## 2020-06-05 VITALS — BP 162/79 | HR 78 | Temp 98.5°F | Resp 18 | Ht 60.0 in | Wt 218.9 lb

## 2020-06-05 DIAGNOSIS — D819 Combined immunodeficiency, unspecified: Secondary | ICD-10-CM | POA: Diagnosis present

## 2020-06-05 DIAGNOSIS — K219 Gastro-esophageal reflux disease without esophagitis: Secondary | ICD-10-CM

## 2020-06-05 DIAGNOSIS — C9110 Chronic lymphocytic leukemia of B-cell type not having achieved remission: Secondary | ICD-10-CM | POA: Diagnosis not present

## 2020-06-05 DIAGNOSIS — D801 Nonfamilial hypogammaglobulinemia: Secondary | ICD-10-CM

## 2020-06-05 DIAGNOSIS — Z79899 Other long term (current) drug therapy: Secondary | ICD-10-CM | POA: Insufficient documentation

## 2020-06-05 LAB — HEPATIC FUNCTION PANEL
ALT: 23 (ref 7–35)
AST: 29 (ref 13–35)
Alkaline Phosphatase: 68 (ref 25–125)
Bilirubin, Total: 0.5

## 2020-06-05 LAB — CBC AND DIFFERENTIAL
HCT: 35 — AB (ref 36–46)
Hemoglobin: 11.6 — AB (ref 12.0–16.0)
Neutrophils Absolute: 1.42
Platelets: 64 — AB (ref 150–399)
WBC: 2.9

## 2020-06-05 LAB — BASIC METABOLIC PANEL
BUN: 21 (ref 4–21)
CO2: 26 — AB (ref 13–22)
Chloride: 107 (ref 99–108)
Creatinine: 0.8 (ref 0.5–1.1)
Glucose: 100
Potassium: 3.9 (ref 3.4–5.3)
Sodium: 139 (ref 137–147)

## 2020-06-05 LAB — COMPREHENSIVE METABOLIC PANEL
Albumin: 4.1 (ref 3.5–5.0)
Calcium: 8.5 — AB (ref 8.7–10.7)

## 2020-06-05 LAB — CBC: RBC: 3.87 (ref 3.87–5.11)

## 2020-06-05 MED ORDER — OMEPRAZOLE 20 MG PO CPDR: 20.0000 mg | DELAYED_RELEASE_CAPSULE | Freq: Every day | ORAL | 3 refills | Status: DC

## 2020-06-05 MED ORDER — LIDOCAINE-PRILOCAINE 2.5-2.5 % EX CREA: 1.0000 "application " | TOPICAL_CREAM | CUTANEOUS | 5 refills | Status: DC | PRN

## 2020-06-05 NOTE — Telephone Encounter (Signed)
Per 3/7 los next appt sched and given to patient 

## 2020-06-06 ENCOUNTER — Inpatient Hospital Stay: Payer: Medicare Other

## 2020-06-06 VITALS — BP 142/72 | HR 80 | Temp 98.6°F | Resp 20 | Ht 60.0 in | Wt 216.8 lb

## 2020-06-06 DIAGNOSIS — D819 Combined immunodeficiency, unspecified: Secondary | ICD-10-CM | POA: Diagnosis not present

## 2020-06-06 DIAGNOSIS — D801 Nonfamilial hypogammaglobulinemia: Secondary | ICD-10-CM

## 2020-06-06 LAB — COMPREHENSIVE METABOLIC PANEL (ASHBORO CC SCANNED REPORT)

## 2020-06-06 LAB — CBC WITH DIFFERENTIAL/PLATELET

## 2020-06-06 MED ORDER — DIPHENHYDRAMINE HCL 50 MG/ML IJ SOLN
INTRAMUSCULAR | Status: AC
  Filled 2020-06-06: qty 1

## 2020-06-06 MED ORDER — HEPARIN SOD (PORK) LOCK FLUSH 100 UNIT/ML IV SOLN
500.0000 [IU] | Freq: Once | INTRAVENOUS | Status: AC | PRN
  Administered 2020-06-06: 500 [IU]
  Filled 2020-06-06: qty 5

## 2020-06-06 MED ORDER — ACETAMINOPHEN 325 MG PO TABS
ORAL_TABLET | ORAL | Status: AC
  Filled 2020-06-06: qty 2

## 2020-06-06 MED ORDER — IMMUNE GLOBULIN (HUMAN) 10 GM/100ML IV SOLN
1.0000 g/kg | Freq: Once | INTRAVENOUS | Status: AC
Start: 2020-06-06 — End: 2020-06-06
  Administered 2020-06-06: 45 g via INTRAVENOUS
  Filled 2020-06-06: qty 400

## 2020-06-06 MED ORDER — ACETAMINOPHEN 325 MG PO TABS
650.0000 mg | ORAL_TABLET | Freq: Once | ORAL | Status: AC
  Administered 2020-06-06: 650 mg via ORAL

## 2020-06-06 MED ORDER — DIPHENHYDRAMINE HCL 50 MG/ML IJ SOLN
25.0000 mg | Freq: Once | INTRAMUSCULAR | Status: AC
  Administered 2020-06-06: 25 mg via INTRAVENOUS

## 2020-06-06 MED ORDER — DEXTROSE 5 % IV SOLN
Freq: Once | INTRAVENOUS | Status: AC
  Filled 2020-06-06: qty 250

## 2020-06-06 MED ORDER — DIPHENHYDRAMINE HCL 50 MG/ML IJ SOLN
25.0000 mg | Freq: Once | INTRAMUSCULAR | Status: AC
Start: 2020-06-06 — End: 2020-06-06
  Administered 2020-06-06: 25 mg via INTRAVENOUS

## 2020-06-06 NOTE — Patient Instructions (Signed)
Immune Globulin Injection What is this medicine? IMMUNE GLOBULIN (im MUNE GLOB yoo lin) helps to prevent or reduce the severity of certain infections in patients who are at risk. This medicine is collected from the pooled blood of many donors. It is used to treat immune system problems, thrombocytopenia, and Kawasaki syndrome. This medicine may be used for other purposes; ask your health care provider or pharmacist if you have questions. COMMON BRAND NAME(S): ASCENIV, Baygam, BIVIGAM, Carimune, Carimune NF, cutaquig, Cuvitru, Flebogamma, Flebogamma DIF, GamaSTAN, GamaSTAN S/D, Gamimune N, Gammagard, Gammagard S/D, Gammaked, Gammaplex, Gammar-P IV, Gamunex, Gamunex-C, Hizentra, Iveegam, Iveegam EN, Octagam, Panglobulin, Panglobulin NF, panzyga, Polygam S/D, Privigen, Sandoglobulin, Venoglobulin-S, Vigam, Vivaglobulin, Xembify What should I tell my health care provider before I take this medicine? They need to know if you have any of these conditions:  diabetes  extremely low or no immune antibodies in the blood  heart disease  history of blood clots  hyperprolinemia  infection in the blood, sepsis  kidney disease  recently received or scheduled to receive a vaccination  an unusual or allergic reaction to human immune globulin, albumin, maltose, sucrose, other medicines, foods, dyes, or preservatives  pregnant or trying to get pregnant  breast-feeding How should I use this medicine? This medicine is for injection into a muscle or infusion into a vein or skin. It is usually given by a health care professional in a hospital or clinic setting. In rare cases, some brands of this medicine might be given at home. You will be taught how to give this medicine. Use exactly as directed. Take your medicine at regular intervals. Do not take your medicine more often than directed. Talk to your pediatrician regarding the use of this medicine in children. While this drug may be prescribed for selected  conditions, precautions do apply. Overdosage: If you think you have taken too much of this medicine contact a poison control center or emergency room at once. NOTE: This medicine is only for you. Do not share this medicine with others. What if I miss a dose? It is important not to miss your dose. Call your doctor or health care professional if you are unable to keep an appointment. If you give yourself the medicine and you miss a dose, take it as soon as you can. If it is almost time for your next dose, take only that dose. Do not take double or extra doses. What may interact with this medicine?  aspirin and aspirin-like medicines  cisplatin  cyclosporine  medicines for infection like acyclovir, adefovir, amphotericin B, bacitracin, cidofovir, foscarnet, ganciclovir, gentamicin, pentamidine, vancomycin  NSAIDS, medicines for pain and inflammation, like ibuprofen or naproxen  pamidronate  vaccines  zoledronic acid This list may not describe all possible interactions. Give your health care provider a list of all the medicines, herbs, non-prescription drugs, or dietary supplements you use. Also tell them if you smoke, drink alcohol, or use illegal drugs. Some items may interact with your medicine. What should I watch for while using this medicine? Your condition will be monitored carefully while you are receiving this medicine. This medicine is made from pooled blood donations of many different people. It may be possible to pass an infection in this medicine. However, the donors are screened for infections and all products are tested for HIV and hepatitis. The medicine is treated to kill most or all bacteria and viruses. Talk to your doctor about the risks and benefits of this medicine. Do not have vaccinations for at least   14 days before, or until at least 3 months after receiving this medicine. What side effects may I notice from receiving this medicine? Side effects that you should report  to your doctor or health care professional as soon as possible:  allergic reactions like skin rash, itching or hives, swelling of the face, lips, or tongue  blue colored lips or skin  breathing problems  chest pain or tightness  fever  signs and symptoms of aseptic meningitis such as stiff neck; sensitivity to light; headache; drowsiness; fever; nausea; vomiting; rash  signs and symptoms of a blood clot such as chest pain; shortness of breath; pain, swelling, or warmth in the leg  signs and symptoms of hemolytic anemia such as fast heartbeat; tiredness; dark yellow or brown urine; or yellowing of the eyes or skin  signs and symptoms of kidney injury like trouble passing urine or change in the amount of urine  sudden weight gain  swelling of the ankles, feet, hands Side effects that usually do not require medical attention (report to your doctor or health care professional if they continue or are bothersome):  diarrhea  flushing  headache  increased sweating  joint pain  muscle cramps  muscle pain  nausea  pain, redness, or irritation at site where injected  tiredness This list may not describe all possible side effects. Call your doctor for medical advice about side effects. You may report side effects to FDA at 1-800-FDA-1088. Where should I keep my medicine? Keep out of the reach of children. This drug is usually given in a hospital or clinic and will not be stored at home. In rare cases, some brands of this medicine may be given at home. If you are using this medicine at home, you will be instructed on how to store this medicine. Throw away any unused medicine after the expiration date on the label. NOTE: This sheet is a summary. It may not cover all possible information. If you have questions about this medicine, talk to your doctor, pharmacist, or health care provider.  2021 Elsevier/Gold Standard (2018-10-21 12:51:14)  

## 2020-07-03 ENCOUNTER — Telehealth: Payer: Self-pay | Admitting: Oncology

## 2020-07-03 ENCOUNTER — Other Ambulatory Visit: Payer: Self-pay

## 2020-07-03 ENCOUNTER — Inpatient Hospital Stay: Payer: Medicare Other | Attending: Oncology | Admitting: Hematology and Oncology

## 2020-07-03 ENCOUNTER — Inpatient Hospital Stay: Payer: Medicare Other

## 2020-07-03 VITALS — BP 157/72 | HR 72 | Temp 98.2°F | Resp 20 | Ht 60.0 in | Wt 220.4 lb

## 2020-07-03 DIAGNOSIS — C9110 Chronic lymphocytic leukemia of B-cell type not having achieved remission: Secondary | ICD-10-CM | POA: Diagnosis not present

## 2020-07-03 DIAGNOSIS — D819 Combined immunodeficiency, unspecified: Secondary | ICD-10-CM | POA: Diagnosis not present

## 2020-07-03 LAB — COMPREHENSIVE METABOLIC PANEL
Albumin: 4.2 (ref 3.5–5.0)
Calcium: 8.8 (ref 8.7–10.7)

## 2020-07-03 LAB — BASIC METABOLIC PANEL
BUN: 23 — AB (ref 4–21)
CO2: 25 — AB (ref 13–22)
Chloride: 108 (ref 99–108)
Creatinine: 0.8 (ref 0.5–1.1)
Glucose: 90
Potassium: 4 (ref 3.4–5.3)
Sodium: 138 (ref 137–147)

## 2020-07-03 LAB — CBC AND DIFFERENTIAL
HCT: 35 — AB (ref 36–46)
Hemoglobin: 11.6 — AB (ref 12.0–16.0)
Neutrophils Absolute: 1.16
Platelets: 61 — AB (ref 150–399)
WBC: 2.7

## 2020-07-03 LAB — HEPATIC FUNCTION PANEL
ALT: 25 (ref 7–35)
AST: 27 (ref 13–35)
Alkaline Phosphatase: 74 (ref 25–125)
Bilirubin, Total: 0.4

## 2020-07-03 LAB — CBC: RBC: 3.87 (ref 3.87–5.11)

## 2020-07-03 MED ORDER — LIDOCAINE-PRILOCAINE 2.5-2.5 % EX CREA: 1.0000 "application " | TOPICAL_CREAM | CUTANEOUS | 5 refills | Status: DC | PRN

## 2020-07-03 NOTE — Progress Notes (Signed)
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  She was on observation until November 2008. She was initially treated with chlorambucil and prednisone, as she refused intravenous chemotherapy.  She had a partial response to this regimen.  By January 2010, she had progressive disease with a white count over 200,000, with associated anemia, splenomegaly, thrombocytopenia, and adenopathy.  She then received 5 cycles of fludarabine with good response.  She did well until January 2012, when she had progression once again.  She had a single dose of bendamustine and rituximab, which kept her disease under control for over a year.  However, she had a severe allergic reaction to the rituximab, so has not received further rituximab.  She was hospitalized after the bendamustine because of severe pancytopenia and required multiple transfusions, so was placed on observation.  In March 2013, she had progression of disease again, so was treated with bendamustine for 6 cycles, at a 50% dose reduction, and once again had a excellent response.  She was on observation until August 2014, then had progression of disease, so was placed on ibrutinib 420 mg daily.  The ibrutinib had kept her disease under fairly good control, but then she develops severe bilateral lower extremity cellulitis in April 2016, so ibrutinib was placed on hold.  She had persistent cellulitis, as well as  Clostridium difficile colitis in May 2016 requiring hospitalization.  She was readmitted soon after discharge with worsening  cellulitis, requiring a prolonged hospitalization.  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 She eventually had resolution of the cellulitis  As she was largely asymptomatic, we continued observation until February 2017, at which time she had worsening splenomegaly.  Repeat CT imaging in February showed progressive disease, with marked splenomegaly and bulky lymphadenopathy.  She was then placed back on ibrutinib 420mg  daily.  The lymphocytosis, anemia and thrombocytopenia slowly improved.  Due to neutropenia, ibrutinib was placed on hold in December 2017.  The ibrutinib was resumed in January 2018, but discontinued in August 2018 due to toxicities, mainly severe abdominal muscle cramping.  She was hospitalized in early September 2018 with urinary tract infection and pancytopenia.  She received multiple red blood cell and platelet transfusions during her stay.  She was admitted again in late September 2018 with perineal cellulitis extending to the lower abdomen and upper thigh, as well as persistent pancytopenia.  CT abdomen and pelvis revealed interval decrease in the abdominal lymphadenopathy, chronic massive splenomegaly with scattered small splenic infarcts, and small left pleural effusion with bibasilar atelectasis.  She required packed red blood cells while hospitalized.  She was discharged home with home health and followed up with the [facility].  We continued to follow her closely, but did not place her on a treatment due to the persistent cellulitis.  She then was admitted in October 2018 with severe hypercalcemia, with a calcium of 15.  She was treated with IV fluids, zoledronic acid and Calcitonin injections.  We continued to monitor her closely.  She had recurrent  hypercalcemia, for which she received IV fluids and zolendronic acid as an outpatient.    Due to worsening pancytopenia, she underwent bone marrow  biopsy in January 2019.   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  The cellulitis had improved to a point we felt she could be treated again, so she was placed on venetoclax CLL in January 2019.  She has continued on venetoclax 400 mg daily after ramp up dosing and has tolerated that fairly well, except for mild diarrhea.  Since starting venetoclax, she has had decreasing splenomegaly and improvement in her pancytopenia.  She initially continued to require IV fluids and zoledronic acid for the hypercalcemia.  Her last dose of zoledronic acid was given in March 2019.  In March, she had an irregular heart rhythm and EKG revealed occasional premature atrial complexes, as well as an incomplete right bundle branch block.  This was stable compared to EKG done in October 2018.  Quantitative immunoglobulins done in August 2019 revealed a normal IgG, with low IgA and IgM.  She has continued IVIG monthly.  She had an episode of Campylobacter diarrhea in November 2019, which was treated with azithromycin for 7 days with resolution of her diarrhea.  She was evaluated by GI who recommended colonoscopy in May of next year.  Venetoclax was held temporarily until her diarrhea resolved.  She has since tolerated venetoclax without significant difficulty.    She completed a full 2 years of monotherapy with venetoclax in January 2021.  We recommended continuing venetoclax 400 mg daily until progression of disease or unacceptable toxicities.  Annual bilateral mammogram in Apri 2021 revealed a possible mass in the left breast, which warranted further evaluation. Right breast did not reveal any evidence of malignancy.  Unilateral diagnostic left mammogram in May, revealed benign fibrocystic change within the outer left breast with no evidence of malignancy.  Routine annual bilateral mammography was recommended.    In February of this year, she had a right  mammogram due to right breast subareolar tenderness, but it was negative.  She is due for bilateral screening mammogram this month.  INTERVAL HISTORY:  Samantha Warren is here today for repeat clinical assessment. She states she continues venetoclax without significant difficulty.  She reports mild fatigue.  She does not sleep well.  She states Dr. Nila Nephew gave her something to help her sleep, but she does not remember the name. She has intermittent diarrhea for which Imodium is effective.  She denies recent infection. She denies fevers, chills or night sweats. She reports chronic knee pain for which she uses oxycodone as needed. Her appetite is good. Her weight has been stable.  REVIEW OF SYSTEMS:  Review of Systems  Constitutional: Negative for appetite change, chills, fatigue, fever and unexpected weight change.  HENT:   Negative for lump/mass, mouth sores and sore throat.   Respiratory: Negative for cough and shortness of breath.   Cardiovascular: Negative for chest pain and leg swelling.  Gastrointestinal: Negative for abdominal pain, constipation, diarrhea, nausea and vomiting.  Endocrine: Negative for hot flashes.  Genitourinary: Negative for difficulty urinating, dysuria, frequency and hematuria.   Musculoskeletal: Negative for arthralgias, back pain and myalgias.  Skin: Negative for rash.  Neurological: Negative for dizziness and headaches.  Hematological: Negative for adenopathy. Does not bruise/bleed easily.  Psychiatric/Behavioral: Negative for depression and sleep disturbance. The patient is not nervous/anxious.      VITALS:  Blood pressure (!) 157/72, pulse 72, temperature 98.2 F (36.8  C), temperature source Oral, resp. rate 20, height 5' (1.524 m), weight 220 lb 6.4 oz (100 kg), SpO2 99 %.  Wt Readings from Last 3 Encounters:  07/03/20 220 lb 6.4 oz (100 kg)  06/06/20 216 lb 12 oz (98.3 kg)  06/05/20 218 lb 14.4 oz (99.3 kg)    Body mass index is 43.04 kg/m.  Performance status  (ECOG): 1 - Symptomatic but completely ambulatory  PHYSICAL EXAM:  Physical Exam Vitals and nursing note reviewed.  Constitutional:      General: She is not in acute distress.    Appearance: Normal appearance.  HENT:     Head: Normocephalic and atraumatic.     Mouth/Throat:     Mouth: Mucous membranes are moist.     Pharynx: Oropharynx is clear. No oropharyngeal exudate or posterior oropharyngeal erythema.  Eyes:     General: No scleral icterus.    Extraocular Movements: Extraocular movements intact.     Conjunctiva/sclera: Conjunctivae normal.     Pupils: Pupils are equal, round, and reactive to light.  Cardiovascular:     Rate and Rhythm: Normal rate and regular rhythm.     Heart sounds: Normal heart sounds. No murmur heard. No friction rub. No gallop.   Pulmonary:     Effort: Pulmonary effort is normal.     Breath sounds: Normal breath sounds. No wheezing, rhonchi or rales.  Chest:  Breasts:     Right: No axillary adenopathy or supraclavicular adenopathy.     Left: No axillary adenopathy or supraclavicular adenopathy.    Abdominal:     General: There is no distension.     Palpations: Abdomen is soft. There is no hepatomegaly, splenomegaly or mass.     Tenderness: There is no abdominal tenderness.  Musculoskeletal:        General: Normal range of motion.     Cervical back: Normal range of motion and neck supple. No tenderness.     Right lower leg: No edema.     Left lower leg: No edema.  Lymphadenopathy:     Cervical: No cervical adenopathy.     Upper Body:     Right upper body: No supraclavicular or axillary adenopathy.     Left upper body: No supraclavicular or axillary adenopathy.     Lower Body: No right inguinal adenopathy. No left inguinal adenopathy.  Skin:    General: Skin is warm and dry.     Coloration: Skin is not jaundiced.     Findings: No rash.  Neurological:     Mental Status: She is alert and oriented to person, place, and time.     Cranial  Nerves: No cranial nerve deficit.  Psychiatric:        Mood and Affect: Mood normal.        Behavior: Behavior normal.        Thought Content: Thought content normal.    LABS:   CBC Latest Ref Rng & Units 07/03/2020 06/05/2020 06/05/2020  WBC - 2.7 Sent to [facility] for testing. See scanned report. 2.9  Hemoglobin 12.0 - 16.0 11.6(A) 11.6(A) -  Hematocrit 36 - 46 35(A) 35(A) -  Platelets 150 - 399 61(A) 64(A) -   CMP Latest Ref Rng & Units 07/03/2020 06/05/2020 05/08/2020  BUN 4 - 21 23(A) 21 18  Creatinine 0.5 - 1.1 0.8 0.8 0.8  Sodium 137 - 147 138 139 141  Potassium 3.4 - 5.3 4.0 3.9 4.0  Chloride 99 - 108 108 107 107  CO2  13 - 22 25(A) 26(A) 26(A)  Calcium 8.7 - 10.7 8.8 8.5(A) 8.6(A)  Alkaline Phos 25 - 125 74 68 71  AST 13 - 35 27 29 30   ALT 7 - 35 25 23 27      No results found for: CEA1 / No results found for: CEA1 No results found for: PSA1 No results found for: KAJ681 No results found for: CAN125  No results found for: TOTALPROTELP, ALBUMINELP, A1GS, A2GS, BETS, BETA2SER, GAMS, MSPIKE, SPEI No results found for: TIBC, FERRITIN, IRONPCTSAT No results found for: LDH  STUDIES:  No results found.    HISTORY:   Past Medical History:  Diagnosis Date  . Cancer [facility])     Past Surgical History:  Procedure Laterality Date  . arm surgery    . GALLBLADDER SURGERY    . TOTAL KNEE ARTHROPLASTY      Family History  Problem Relation Age of Onset  . Cancer Mother   . Hypertension Father   . Hypertension Sister   . Stroke Sister   . Hypertension Brother   . Stroke Brother     Social History:  reports that she has been smoking. She has a 15.00 pack-year smoking history. She has never used smokeless tobacco. She reports current drug use. No history on file for alcohol use.The patient is alone today.  Allergies:  Allergies  Allergen Reactions  . Metronidazole   . Rituximab   . Sulfa Antibiotics     Current Medications: Current Outpatient Medications   Medication Sig Dispense Refill  . alendronate (FOSAMAX) 70 MG tablet     . Ascorbic Acid (VITAMIN C WITH ROSE HIPS) 500 MG tablet Take 500 mg by mouth daily.    . benzonatate (TESSALON) 200 MG capsule TAKE ONE CAPSULE 3 TIMES A DAY AS NEEDED FOR COUGH  5  . cyclobenzaprine (FLEXERIL) 5 MG tablet     . diclofenac Sodium (VOLTAREN) 1 % GEL Apply topically.    . hydrochlorothiazide (MICROZIDE) 12.5 MG capsule Take by mouth.    Marland Kitchen KLOR-CON M20 20 MEQ tablet     . lidocaine (LIDODERM) 5 % 2 patches daily.    Marland Kitchen lidocaine-prilocaine (EMLA) cream Apply 1 application topically as needed. 30 g 5  . NEOMYCIN-POLYMYXIN-HYDROCORTISONE (CORTISPORIN) 1 % SOLN otic solution     . omeprazole (PRILOSEC) 20 MG capsule Take 1 capsule (20 mg total) by mouth daily. 90 capsule 3  . oxyCODONE (ROXICODONE) 15 MG immediate release tablet Take 15 mg by mouth every 6 (six) hours as needed.    . prochlorperazine (COMPAZINE) 10 MG tablet     . VENCLEXTA 100 MG tablet Take 400 mg by mouth daily.    . Vibegron (GEMTESA) 75 MG TABS Take 75 mg by mouth at bedtime.      No current facility-administered medications for this visit.     ASSESSMENT & PLAN:   Assessment:  1. CLL, well controlled with venetoclax 400mg  daily, which she will continue. Her blood counts remain fairly stable.  2. 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    3. 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  Plan:   She will proceed with IVIG tomorrow.  She knows to continue venetoclax daily.  I will plan to see her back in 4 weeks with a CBC and comprehensive metabolic panel prior to her next IVIG. The patient understands the plans discussed today and is in agreement with them.  She knows  to contact our office if she develops concerns prior to her next appointment.   [name], PA-C

## 2020-07-03 NOTE — Telephone Encounter (Signed)
Per 4/4 LOS, patient schedule for May Appt's.  Gave patient Appt Summary

## 2020-07-04 ENCOUNTER — Inpatient Hospital Stay: Payer: Medicare Other

## 2020-07-04 ENCOUNTER — Other Ambulatory Visit: Payer: Self-pay | Admitting: Hematology and Oncology

## 2020-07-04 ENCOUNTER — Encounter: Payer: Self-pay | Admitting: Hematology and Oncology

## 2020-07-04 VITALS — BP 138/65 | HR 77 | Resp 18 | Ht 60.0 in | Wt 218.2 lb

## 2020-07-04 DIAGNOSIS — D819 Combined immunodeficiency, unspecified: Secondary | ICD-10-CM | POA: Diagnosis not present

## 2020-07-04 DIAGNOSIS — D801 Nonfamilial hypogammaglobulinemia: Secondary | ICD-10-CM

## 2020-07-04 MED ORDER — HEPARIN SOD (PORK) LOCK FLUSH 100 UNIT/ML IV SOLN
500.0000 [IU] | Freq: Once | INTRAVENOUS | Status: AC | PRN
  Administered 2020-07-04: 500 [IU]
  Filled 2020-07-04: qty 5

## 2020-07-04 MED ORDER — IMMUNE GLOBULIN (HUMAN) 10 GM/100ML IV SOLN
1.0000 g/kg | Freq: Once | INTRAVENOUS | Status: AC
  Administered 2020-07-04: 45 g via INTRAVENOUS
  Filled 2020-07-04: qty 400

## 2020-07-04 MED ORDER — DIPHENHYDRAMINE HCL 50 MG/ML IJ SOLN
INTRAMUSCULAR | Status: AC
  Filled 2020-07-04: qty 1

## 2020-07-04 MED ORDER — DIPHENHYDRAMINE HCL 50 MG/ML IJ SOLN
25.0000 mg | Freq: Once | INTRAMUSCULAR | Status: AC
  Administered 2020-07-04: 25 mg via INTRAVENOUS

## 2020-07-04 MED ORDER — ACETAMINOPHEN 325 MG PO TABS
ORAL_TABLET | ORAL | Status: AC
  Filled 2020-07-04: qty 2

## 2020-07-04 MED ORDER — ACETAMINOPHEN 325 MG PO TABS
650.0000 mg | ORAL_TABLET | Freq: Once | ORAL | Status: AC
  Administered 2020-07-04: 650 mg via ORAL

## 2020-07-04 MED ORDER — DEXTROSE 5 % IV SOLN
Freq: Once | INTRAVENOUS | Status: AC
  Filled 2020-07-04: qty 250

## 2020-07-04 NOTE — Patient Instructions (Signed)
Immune Globulin Injection What is this medicine? IMMUNE GLOBULIN (im MUNE GLOB yoo lin) helps to prevent or reduce the severity of certain infections in patients who are at risk. This medicine is collected from the pooled blood of many donors. It is used to treat immune system problems, thrombocytopenia, and Kawasaki syndrome. This medicine may be used for other purposes; ask your health care provider or pharmacist if you have questions. COMMON BRAND NAME(S): ASCENIV, Baygam, BIVIGAM, Carimune, Carimune NF, cutaquig, Cuvitru, Flebogamma, Flebogamma DIF, GamaSTAN, GamaSTAN S/D, Gamimune N, Gammagard, Gammagard S/D, Gammaked, Gammaplex, Gammar-P IV, Gamunex, Gamunex-C, Hizentra, Iveegam, Iveegam EN, Octagam, Panglobulin, Panglobulin NF, panzyga, Polygam S/D, Privigen, Sandoglobulin, Venoglobulin-S, Vigam, Vivaglobulin, Xembify What should I tell my health care provider before I take this medicine? They need to know if you have any of these conditions:  diabetes  extremely low or no immune antibodies in the blood  heart disease  history of blood clots  hyperprolinemia  infection in the blood, sepsis  kidney disease  recently received or scheduled to receive a vaccination  an unusual or allergic reaction to human immune globulin, albumin, maltose, sucrose, other medicines, foods, dyes, or preservatives  pregnant or trying to get pregnant  breast-feeding How should I use this medicine? This medicine is for injection into a muscle or infusion into a vein or skin. It is usually given by a health care professional in a hospital or clinic setting. In rare cases, some brands of this medicine might be given at home. You will be taught how to give this medicine. Use exactly as directed. Take your medicine at regular intervals. Do not take your medicine more often than directed. Talk to your pediatrician regarding the use of this medicine in children. While this drug may be prescribed for selected  conditions, precautions do apply. Overdosage: If you think you have taken too much of this medicine contact a poison control center or emergency room at once. NOTE: This medicine is only for you. Do not share this medicine with others. What if I miss a dose? It is important not to miss your dose. Call your doctor or health care professional if you are unable to keep an appointment. If you give yourself the medicine and you miss a dose, take it as soon as you can. If it is almost time for your next dose, take only that dose. Do not take double or extra doses. What may interact with this medicine?  aspirin and aspirin-like medicines  cisplatin  cyclosporine  medicines for infection like acyclovir, adefovir, amphotericin B, bacitracin, cidofovir, foscarnet, ganciclovir, gentamicin, pentamidine, vancomycin  NSAIDS, medicines for pain and inflammation, like ibuprofen or naproxen  pamidronate  vaccines  zoledronic acid This list may not describe all possible interactions. Give your health care provider a list of all the medicines, herbs, non-prescription drugs, or dietary supplements you use. Also tell them if you smoke, drink alcohol, or use illegal drugs. Some items may interact with your medicine. What should I watch for while using this medicine? Your condition will be monitored carefully while you are receiving this medicine. This medicine is made from pooled blood donations of many different people. It may be possible to pass an infection in this medicine. However, the donors are screened for infections and all products are tested for HIV and hepatitis. The medicine is treated to kill most or all bacteria and viruses. Talk to your doctor about the risks and benefits of this medicine. Do not have vaccinations for at least   14 days before, or until at least 3 months after receiving this medicine. What side effects may I notice from receiving this medicine? Side effects that you should report  to your doctor or health care professional as soon as possible:  allergic reactions like skin rash, itching or hives, swelling of the face, lips, or tongue  blue colored lips or skin  breathing problems  chest pain or tightness  fever  signs and symptoms of aseptic meningitis such as stiff neck; sensitivity to light; headache; drowsiness; fever; nausea; vomiting; rash  signs and symptoms of a blood clot such as chest pain; shortness of breath; pain, swelling, or warmth in the leg  signs and symptoms of hemolytic anemia such as fast heartbeat; tiredness; dark yellow or brown urine; or yellowing of the eyes or skin  signs and symptoms of kidney injury like trouble passing urine or change in the amount of urine  sudden weight gain  swelling of the ankles, feet, hands Side effects that usually do not require medical attention (report to your doctor or health care professional if they continue or are bothersome):  diarrhea  flushing  headache  increased sweating  joint pain  muscle cramps  muscle pain  nausea  pain, redness, or irritation at site where injected  tiredness This list may not describe all possible side effects. Call your doctor for medical advice about side effects. You may report side effects to FDA at 1-800-FDA-1088. Where should I keep my medicine? Keep out of the reach of children. This drug is usually given in a hospital or clinic and will not be stored at home. In rare cases, some brands of this medicine may be given at home. If you are using this medicine at home, you will be instructed on how to store this medicine. Throw away any unused medicine after the expiration date on the label. NOTE: This sheet is a summary. It may not cover all possible information. If you have questions about this medicine, talk to your doctor, pharmacist, or health care provider.  2021 Elsevier/Gold Standard (2018-10-21 12:51:14)  

## 2020-07-04 NOTE — Progress Notes (Signed)
1251: PT STABLE AT TIME OF DISCHARGE

## 2020-07-28 ENCOUNTER — Other Ambulatory Visit: Payer: Self-pay | Admitting: Hematology and Oncology

## 2020-07-28 DIAGNOSIS — D819 Combined immunodeficiency, unspecified: Secondary | ICD-10-CM

## 2020-07-28 DIAGNOSIS — C9110 Chronic lymphocytic leukemia of B-cell type not having achieved remission: Secondary | ICD-10-CM

## 2020-07-31 ENCOUNTER — Encounter: Payer: Self-pay | Admitting: Hematology and Oncology

## 2020-07-31 ENCOUNTER — Inpatient Hospital Stay: Payer: Medicare Other | Attending: Hematology and Oncology

## 2020-07-31 ENCOUNTER — Other Ambulatory Visit: Payer: Self-pay

## 2020-07-31 ENCOUNTER — Inpatient Hospital Stay (INDEPENDENT_AMBULATORY_CARE_PROVIDER_SITE_OTHER): Payer: Medicare Other | Admitting: Hematology and Oncology

## 2020-07-31 ENCOUNTER — Telehealth: Payer: Self-pay | Admitting: Hematology and Oncology

## 2020-07-31 VITALS — BP 156/74 | HR 74 | Temp 98.2°F | Resp 18 | Ht 60.0 in | Wt 219.5 lb

## 2020-07-31 DIAGNOSIS — Z79899 Other long term (current) drug therapy: Secondary | ICD-10-CM | POA: Insufficient documentation

## 2020-07-31 DIAGNOSIS — C9110 Chronic lymphocytic leukemia of B-cell type not having achieved remission: Secondary | ICD-10-CM

## 2020-07-31 DIAGNOSIS — D819 Combined immunodeficiency, unspecified: Secondary | ICD-10-CM | POA: Insufficient documentation

## 2020-07-31 DIAGNOSIS — R109 Unspecified abdominal pain: Secondary | ICD-10-CM | POA: Diagnosis not present

## 2020-07-31 DIAGNOSIS — D801 Nonfamilial hypogammaglobulinemia: Secondary | ICD-10-CM | POA: Diagnosis not present

## 2020-07-31 DIAGNOSIS — R10A2 Flank pain, left side: Secondary | ICD-10-CM

## 2020-07-31 LAB — HEPATIC FUNCTION PANEL
ALT: 22 (ref 7–35)
AST: 25 (ref 13–35)
Alkaline Phosphatase: 72 (ref 25–125)
Bilirubin, Total: 0.5

## 2020-07-31 LAB — CBC AND DIFFERENTIAL
HCT: 36 (ref 36–46)
Hemoglobin: 11.8 — AB (ref 12.0–16.0)
Neutrophils Absolute: 1.14
Platelets: 64 — AB (ref 150–399)
WBC: 2.6

## 2020-07-31 LAB — COMPREHENSIVE METABOLIC PANEL
Albumin: 4.1 (ref 3.5–5.0)
Calcium: 8.7 (ref 8.7–10.7)

## 2020-07-31 LAB — BASIC METABOLIC PANEL
BUN: 17 (ref 4–21)
CO2: 25 — AB (ref 13–22)
Chloride: 107 (ref 99–108)
Creatinine: 0.9 (ref 0.5–1.1)
Glucose: 101
Potassium: 3.9 (ref 3.4–5.3)
Sodium: 140 (ref 137–147)

## 2020-07-31 LAB — CBC
MCV: 90 (ref 81–99)
RBC: 4 (ref 3.87–5.11)

## 2020-07-31 MED ORDER — SODIUM CHLORIDE 0.9% FLUSH
10.0000 mL | INTRAVENOUS | Status: DC | PRN
  Administered 2020-07-31: 10 mL
  Filled 2020-07-31: qty 10

## 2020-07-31 MED ORDER — HEPARIN SOD (PORK) LOCK FLUSH 100 UNIT/ML IV SOLN
500.0000 [IU] | Freq: Once | INTRAVENOUS | Status: AC | PRN
  Administered 2020-07-31: 500 [IU]
  Filled 2020-07-31: qty 5

## 2020-07-31 MED FILL — Immune Globulin (Human) IV Soln 10 GM/100ML: INTRAVENOUS | Qty: 450 | Status: AC

## 2020-07-31 NOTE — Telephone Encounter (Signed)
Per 5/2 los next appt schedule and given to patient 

## 2020-07-31 NOTE — Progress Notes (Signed)
[name]  [address] 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  She was on observation until November 2008. She was initially treated with chlorambucil and prednisone, as she refused intravenous chemotherapy.  She had a partial response to this regimen.  By January 2010, she had progressive disease with a white count over 200,000, with associated anemia, splenomegaly, thrombocytopenia, and adenopathy.  She then received 5 cycles of fludarabine with good response.  She did well until January 2012, when she had progression once again.  She had a single dose of bendamustine and rituximab, which kept her disease under control for over a year.  However, she had a severe allergic reaction to the rituximab, so has not received further rituximab.  She was hospitalized after the bendamustine because of severe pancytopenia and required multiple transfusions, so was placed on observation.  In March 2013, she had progression of disease again, so was treated with bendamustine for 6 cycles, at a 50% dose reduction, and once again had a excellent response.  She was on observation until August 2014, then had progression of disease, so was placed on ibrutinib 420 mg daily.  The ibrutinib had kept her disease under fairly good control, but then she develops severe bilateral lower extremity cellulitis in April 2016, so ibrutinib was placed on hold.  She had persistent cellulitis, as well as  Clostridium difficile colitis in May 2016 requiring hospitalization.  She was readmitted soon after discharge with worsening  cellulitis, requiring a prolonged hospitalization.  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 She eventually had resolution of the cellulitis  As she was largely asymptomatic, we continued observation until February 2017, at which time she had worsening splenomegaly.  Repeat CT imaging in February showed progressive disease, with marked splenomegaly and bulky lymphadenopathy.  She was then placed back on ibrutinib 420mg  daily.  The lymphocytosis, anemia and thrombocytopenia slowly improved.  Due to neutropenia, ibrutinib was placed on hold in December 2017.  The ibrutinib was resumed in January 2018, but discontinued in August 2018 due to toxicities, mainly severe abdominal muscle cramping.  She was hospitalized in early September 2018 with urinary tract infection and pancytopenia.  She received multiple red blood cell and platelet transfusions during her stay.  She was admitted again in late September 2018 with perineal cellulitis extending to the lower abdomen and upper thigh, as well as persistent pancytopenia.  CT abdomen and pelvis revealed interval decrease in the abdominal lymphadenopathy, chronic massive splenomegaly with scattered small splenic infarcts, and small left pleural effusion with bibasilar atelectasis.  She required packed red blood cells while hospitalized.  She was discharged home with home health and followed up with the [facility].  We continued to follow her closely, but did not place her on a treatment due to the persistent cellulitis.  She then was admitted in October 2018 with severe hypercalcemia, with a calcium of 15.  She was treated with IV fluids, zoledronic acid and Calcitonin injections.  We continued to monitor her closely.  She had recurrent  hypercalcemia, for which she received IV fluids and zolendronic acid as an outpatient.    Due to worsening pancytopenia, she underwent bone marrow  biopsy in January 2019.   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  The cellulitis had improved to a point we felt she could be treated again, so she was placed on venetoclax CLL in January 2019.  She has continued on venetoclax 400 mg daily after ramp up dosing and has tolerated that fairly well, except for mild diarrhea.  Since starting venetoclax, she has had decreasing splenomegaly and improvement in her pancytopenia.  She initially continued to require IV fluids and zoledronic acid for the hypercalcemia.  Her last dose of zoledronic acid was given in March 2019.  In March, she had an irregular heart rhythm and EKG revealed occasional premature atrial complexes, as well as an incomplete right bundle branch block.  This was stable compared to EKG done in October 2018.  Quantitative immunoglobulins done in August 2019 revealed a normal IgG, with low IgA and IgM.  She has continued IVIG monthly.  She had an episode of Campylobacter diarrhea in November 2019, which was treated with azithromycin for 7 days with resolution of her diarrhea.  She was evaluated by GI who recommended colonoscopy in May of next year.  Venetoclax was held temporarily until her diarrhea resolved.  She has since tolerated venetoclax without significant difficulty.    She completed a full 2 years of monotherapy with venetoclax in January 2021.  We recommended continuing venetoclax 400 mg daily until progression of disease or unacceptable toxicities.  Annual bilateral mammogram in Apri 2021 revealed a possible mass in the left breast, which warranted further evaluation. Right breast did not reveal any evidence of malignancy.  Unilateral diagnostic left mammogram in May, revealed benign fibrocystic change within the outer left breast with no evidence of malignancy.  Routine annual bilateral mammography was recommended.   In February of this year, she had a right  diagnostic mammogram due to right breast subareolar tenderness, which did not reveal any evidence of malignancy. She is due for bilateral screening mammogram this month.  INTERVAL HISTORY:  Samantha Warren is here today for repeat clinical assessment. She states she continues venetoclax without significant difficulty.  She reports mild fatigue.  She reports left flank pain for about 3 weeks. She states she wakes up with it, but then it resolves after taking her morning medications, including ibuprofen and oxycodone.  She was seen in urgent care and felt to possibly have urinary tract infection, but the urine was negative.  She has intermittent diarrhea for which Imodium is effective. She denies fevers, chills or night sweats. She has continued chronic knee pain for which she uses oxycodone as needed. Her appetite is good. Her weight has been stable.   REVIEW OF SYSTEMS:  Review of Systems  Constitutional: Negative for appetite change, chills, fatigue, fever and unexpected weight change.  HENT:   Negative for lump/mass, mouth sores and sore throat.   Respiratory: Negative for cough and shortness of breath.   Cardiovascular: Negative for chest pain and leg swelling.  Gastrointestinal: Negative for abdominal pain, constipation, diarrhea, nausea and vomiting.  Genitourinary: Negative for difficulty urinating, dysuria, frequency and hematuria.   Musculoskeletal: Negative for arthralgias, back pain and myalgias.  Skin: Negative for rash.  Neurological: Negative for dizziness and headaches.  Hematological: Negative for adenopathy. Does not bruise/bleed easily.  Psychiatric/Behavioral: Negative for depression and sleep disturbance. The patient  is not nervous/anxious.      VITALS:  Blood pressure (!) 156/74, pulse 74, temperature 98.2 F (36.8 C), temperature source Oral, resp. rate 18, height 5' (1.524 m), weight 219 lb 8 oz (99.6 kg), SpO2 100 %.  Wt Readings from Last 3 Encounters:  07/31/20 219 lb 8 oz  (99.6 kg)  07/04/20 218 lb 4 oz (99 kg)  07/03/20 220 lb 6.4 oz (100 kg)    Body mass index is 42.87 kg/m.  Performance status (ECOG): 1 - Symptomatic but completely ambulatory  PHYSICAL EXAM:  Physical Exam Vitals and nursing note reviewed.  Constitutional:      General: She is not in acute distress.    Appearance: Normal appearance.  HENT:     Head: Normocephalic and atraumatic.     Mouth/Throat:     Mouth: Mucous membranes are moist.     Pharynx: Oropharynx is clear. No oropharyngeal exudate or posterior oropharyngeal erythema.  Eyes:     General: No scleral icterus.    Extraocular Movements: Extraocular movements intact.     Conjunctiva/sclera: Conjunctivae normal.     Pupils: Pupils are equal, round, and reactive to light.  Cardiovascular:     Rate and Rhythm: Normal rate and regular rhythm.     Heart sounds: Normal heart sounds. No murmur heard. No friction rub. No gallop.   Pulmonary:     Effort: Pulmonary effort is normal.     Breath sounds: Normal breath sounds. No wheezing, rhonchi or rales.  Chest:  Breasts:     Right: No axillary adenopathy or supraclavicular adenopathy.     Left: No axillary adenopathy or supraclavicular adenopathy.    Abdominal:     General: There is no distension.     Palpations: Abdomen is soft. There is no hepatomegaly, splenomegaly or mass.     Tenderness: There is no abdominal tenderness.  Musculoskeletal:        General: Normal range of motion.     Cervical back: Normal range of motion and neck supple. No tenderness.     Right lower leg: No edema.     Left lower leg: No edema.  Lymphadenopathy:     Cervical: No cervical adenopathy.     Upper Body:     Right upper body: No supraclavicular or axillary adenopathy.     Left upper body: No supraclavicular or axillary adenopathy.     Lower Body: No right inguinal adenopathy. No left inguinal adenopathy.  Skin:    General: Skin is warm and dry.     Coloration: Skin is not jaundiced.      Findings: No rash.  Neurological:     Mental Status: She is alert and oriented to person, place, and time.     Cranial Nerves: No cranial nerve deficit.  Psychiatric:        Mood and Affect: Mood normal.        Behavior: Behavior normal.        Thought Content: Thought content normal.    LABS:   CBC Latest Ref Rng & Units 07/31/2020 07/03/2020 06/05/2020  WBC - 2.6 2.7 Sent to [facility] for testing. See scanned report.  Hemoglobin 12.0 - 16.0 11.8(A) 11.6(A) 11.6(A)  Hematocrit 36 - 46 36 35(A) 35(A)  Platelets 150 - 399 64(A) 61(A) 64(A)   CMP Latest Ref Rng & Units 07/31/2020 07/03/2020 06/05/2020  BUN 4 - 21 17 23(A) 21  Creatinine 0.5 - 1.1 0.9 0.8 0.8  Sodium 137 - 147 140  138 139  Potassium 3.4 - 5.3 3.9 4.0 3.9  Chloride 99 - 108 107 108 107  CO2 13 - 22 25(A) 25(A) 26(A)  Calcium 8.7 - 10.7 8.7 8.8 8.5(A)  Alkaline Phos 25 - 125 72 74 68  AST 13 - 35 25 27 29   ALT 7 - 35 22 25 23      No results found for: CEA1 / No results found for: CEA1 No results found for: PSA1 No results found for: [id number] No results found for: CAN125  No results found for: TOTALPROTELP, ALBUMINELP, A1GS, A2GS, BETS, BETA2SER, GAMS, MSPIKE, SPEI No results found for: TIBC, FERRITIN, IRONPCTSAT No results found for: LDH  STUDIES:  No results found.    HISTORY:   Past Medical History:  Diagnosis Date  . Cancer [facility])     Past Surgical History:  Procedure Laterality Date  . arm surgery    . GALLBLADDER SURGERY    . TOTAL KNEE ARTHROPLASTY      Family History  Problem Relation Age of Onset  . Cancer Mother   . Hypertension Father   . Hypertension Sister   . Stroke Sister   . Hypertension Brother   . Stroke Brother     Social History:  reports that she has been smoking. She has a 15.00 pack-year smoking history. She has never used smokeless tobacco. She reports current drug use. No history on file for alcohol use.The patient is alone today.  Allergies:  Allergies  Allergen  Reactions  . Metronidazole   . Rituximab   . Sulfa Antibiotics     Current Medications: Current Outpatient Medications  Medication Sig Dispense Refill  . alendronate (FOSAMAX) 70 MG tablet     . Ascorbic Acid (VITAMIN C WITH ROSE HIPS) 500 MG tablet Take 500 mg by mouth daily.    . benzonatate (TESSALON) 200 MG capsule TAKE ONE CAPSULE 3 TIMES A DAY AS NEEDED FOR COUGH  5  . cyclobenzaprine (FLEXERIL) 5 MG tablet     . diclofenac Sodium (VOLTAREN) 1 % GEL Apply topically.    . hydrochlorothiazide (MICROZIDE) 12.5 MG capsule Take by mouth.    Marland Kitchen KLOR-CON M20 20 MEQ tablet     . lidocaine (LIDODERM) 5 % 2 patches daily.    Marland Kitchen lidocaine-prilocaine (EMLA) cream Apply 1 application topically as needed. 30 g 5  . NEOMYCIN-POLYMYXIN-HYDROCORTISONE (CORTISPORIN) 1 % SOLN otic solution     . omeprazole (PRILOSEC) 20 MG capsule Take 1 capsule (20 mg total) by mouth daily. 90 capsule 3  . oxyCODONE (ROXICODONE) 15 MG immediate release tablet Take 15 mg by mouth every 6 (six) hours as needed.    . prochlorperazine (COMPAZINE) 10 MG tablet     . VENCLEXTA 100 MG tablet Take 400 mg by mouth daily.    . Vibegron (GEMTESA) 75 MG TABS Take 75 mg by mouth at bedtime.      Current Facility-Administered Medications  Medication Dose Route Frequency Provider Last Rate Last Admin  . sodium chloride flush (NS) 0.9 % injection 10 mL  10 mL Intracatheter PRN Syrina Wake A, PA-C   10 mL at 07/31/20 0904     ASSESSMENT & PLAN:   Assessment:  1. CLL, well controlled with venetoclax 400mg  daily, which she will continue. Her blood counts remain fairly stable.  2. 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    3. 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  4. New  left flank pain, I will obtain a CT abdomen for further evaluation.  Plan:   I will schedule a CT abdomen to evaluate the left flank pain. She will  proceed with IVIG tomorrow.  She knows to continue venetoclax daily. She is due for bilateral screening mammogram at this time, so I will schedule that.  I will plan to see her back in 4 weeks with a CBC and comprehensive metabolic panel prior to her next IVIG. The patient understands the plans discussed today and is in agreement with them.  She knows to contact our office if she develops concerns prior to her next appointment.   [name], PA-C

## 2020-08-01 ENCOUNTER — Inpatient Hospital Stay: Payer: Medicare Other

## 2020-08-01 VITALS — BP 130/64 | HR 69 | Temp 97.8°F | Resp 18 | Ht 60.0 in | Wt 220.8 lb

## 2020-08-01 DIAGNOSIS — D801 Nonfamilial hypogammaglobulinemia: Secondary | ICD-10-CM

## 2020-08-01 DIAGNOSIS — D819 Combined immunodeficiency, unspecified: Secondary | ICD-10-CM | POA: Diagnosis not present

## 2020-08-01 MED ORDER — HEPARIN SOD (PORK) LOCK FLUSH 100 UNIT/ML IV SOLN
500.0000 [IU] | Freq: Once | INTRAVENOUS | Status: AC | PRN
  Administered 2020-08-01: 500 [IU]
  Filled 2020-08-01: qty 5

## 2020-08-01 MED ORDER — DEXTROSE 5 % IV SOLN
Freq: Once | INTRAVENOUS | Status: AC
  Filled 2020-08-01: qty 250

## 2020-08-01 MED ORDER — ACETAMINOPHEN 325 MG PO TABS
ORAL_TABLET | ORAL | Status: AC
  Filled 2020-08-01: qty 2

## 2020-08-01 MED ORDER — DIPHENHYDRAMINE HCL 50 MG/ML IJ SOLN
25.0000 mg | Freq: Once | INTRAMUSCULAR | Status: AC
  Administered 2020-08-01: 25 mg via INTRAVENOUS

## 2020-08-01 MED ORDER — DIPHENHYDRAMINE HCL 50 MG/ML IJ SOLN
INTRAMUSCULAR | Status: AC
  Filled 2020-08-01: qty 1

## 2020-08-01 MED ORDER — IMMUNE GLOBULIN (HUMAN) 10 GM/100ML IV SOLN
1.0000 g/kg | Freq: Once | INTRAVENOUS | Status: AC
  Administered 2020-08-01: 45 g via INTRAVENOUS
  Filled 2020-08-01: qty 450

## 2020-08-01 MED ORDER — ACETAMINOPHEN 325 MG PO TABS
650.0000 mg | ORAL_TABLET | Freq: Once | ORAL | Status: AC
  Administered 2020-08-01: 650 mg via ORAL

## 2020-08-01 NOTE — Progress Notes (Signed)
1255: PT STABLE AT TIME OF DISCHARGE  

## 2020-08-01 NOTE — Patient Instructions (Signed)
Immune Globulin Injection What is this medicine? IMMUNE GLOBULIN (im MUNE GLOB yoo lin) helps to prevent or reduce the severity of certain infections in patients who are at risk. This medicine is collected from the pooled blood of many donors. It is used to treat immune system problems, thrombocytopenia, and Kawasaki syndrome. This medicine may be used for other purposes; ask your health care provider or pharmacist if you have questions. COMMON BRAND NAME(S): ASCENIV, Baygam, BIVIGAM, Carimune, Carimune NF, cutaquig, Cuvitru, Flebogamma, Flebogamma DIF, GamaSTAN, GamaSTAN S/D, Gamimune N, Gammagard, Gammagard S/D, Gammaked, Gammaplex, Gammar-P IV, Gamunex, Gamunex-C, Hizentra, Iveegam, Iveegam EN, Octagam, Panglobulin, Panglobulin NF, panzyga, Polygam S/D, Privigen, Sandoglobulin, Venoglobulin-S, Vigam, Vivaglobulin, Xembify What should I tell my health care provider before I take this medicine? They need to know if you have any of these conditions:  diabetes  extremely low or no immune antibodies in the blood  heart disease  history of blood clots  hyperprolinemia  infection in the blood, sepsis  kidney disease  recently received or scheduled to receive a vaccination  an unusual or allergic reaction to human immune globulin, albumin, maltose, sucrose, other medicines, foods, dyes, or preservatives  pregnant or trying to get pregnant  breast-feeding How should I use this medicine? This medicine is for injection into a muscle or infusion into a vein or skin. It is usually given by a health care professional in a hospital or clinic setting. In rare cases, some brands of this medicine might be given at home. You will be taught how to give this medicine. Use exactly as directed. Take your medicine at regular intervals. Do not take your medicine more often than directed. Talk to your pediatrician regarding the use of this medicine in children. While this drug may be prescribed for selected  conditions, precautions do apply. Overdosage: If you think you have taken too much of this medicine contact a poison control center or emergency room at once. NOTE: This medicine is only for you. Do not share this medicine with others. What if I miss a dose? It is important not to miss your dose. Call your doctor or health care professional if you are unable to keep an appointment. If you give yourself the medicine and you miss a dose, take it as soon as you can. If it is almost time for your next dose, take only that dose. Do not take double or extra doses. What may interact with this medicine?  aspirin and aspirin-like medicines  cisplatin  cyclosporine  medicines for infection like acyclovir, adefovir, amphotericin B, bacitracin, cidofovir, foscarnet, ganciclovir, gentamicin, pentamidine, vancomycin  NSAIDS, medicines for pain and inflammation, like ibuprofen or naproxen  pamidronate  vaccines  zoledronic acid This list may not describe all possible interactions. Give your health care provider a list of all the medicines, herbs, non-prescription drugs, or dietary supplements you use. Also tell them if you smoke, drink alcohol, or use illegal drugs. Some items may interact with your medicine. What should I watch for while using this medicine? Your condition will be monitored carefully while you are receiving this medicine. This medicine is made from pooled blood donations of many different people. It may be possible to pass an infection in this medicine. However, the donors are screened for infections and all products are tested for HIV and hepatitis. The medicine is treated to kill most or all bacteria and viruses. Talk to your doctor about the risks and benefits of this medicine. Do not have vaccinations for at least   14 days before, or until at least 3 months after receiving this medicine. What side effects may I notice from receiving this medicine? Side effects that you should report  to your doctor or health care professional as soon as possible:  allergic reactions like skin rash, itching or hives, swelling of the face, lips, or tongue  blue colored lips or skin  breathing problems  chest pain or tightness  fever  signs and symptoms of aseptic meningitis such as stiff neck; sensitivity to light; headache; drowsiness; fever; nausea; vomiting; rash  signs and symptoms of a blood clot such as chest pain; shortness of breath; pain, swelling, or warmth in the leg  signs and symptoms of hemolytic anemia such as fast heartbeat; tiredness; dark yellow or brown urine; or yellowing of the eyes or skin  signs and symptoms of kidney injury like trouble passing urine or change in the amount of urine  sudden weight gain  swelling of the ankles, feet, hands Side effects that usually do not require medical attention (report to your doctor or health care professional if they continue or are bothersome):  diarrhea  flushing  headache  increased sweating  joint pain  muscle cramps  muscle pain  nausea  pain, redness, or irritation at site where injected  tiredness This list may not describe all possible side effects. Call your doctor for medical advice about side effects. You may report side effects to FDA at 1-800-FDA-1088. Where should I keep my medicine? Keep out of the reach of children. This drug is usually given in a hospital or clinic and will not be stored at home. In rare cases, some brands of this medicine may be given at home. If you are using this medicine at home, you will be instructed on how to store this medicine. Throw away any unused medicine after the expiration date on the label. NOTE: This sheet is a summary. It may not cover all possible information. If you have questions about this medicine, talk to your doctor, pharmacist, or health care provider.  2021 Elsevier/Gold Standard (2018-10-21 12:51:14)  

## 2020-08-15 ENCOUNTER — Encounter: Payer: Self-pay | Admitting: Hematology and Oncology

## 2020-08-17 ENCOUNTER — Encounter: Payer: Self-pay | Admitting: Hematology and Oncology

## 2020-08-25 ENCOUNTER — Other Ambulatory Visit: Payer: Self-pay | Admitting: Pharmacist

## 2020-08-25 NOTE — Progress Notes (Signed)
[place]  [address] 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  She was on observation until November 2008. She was initially treated with chlorambucil and prednisone, as she refused intravenous chemotherapy.  She had a partial response to this regimen.  By January 2010, she had progressive disease with a white count over 200,000, with associated anemia, splenomegaly, thrombocytopenia, and adenopathy.  She then received 5 cycles of fludarabine with good response.  She did well until January 2012, when she had progression once again.  She had a single dose of bendamustine and rituximab, which kept her disease under control for over a year.  However, she had a severe allergic reaction to the rituximab, so has not received further rituximab.  She was hospitalized after the bendamustine because of severe pancytopenia and required multiple transfusions, so was placed on observation.  In March 2013, she had progression of disease again, so was treated with bendamustine for 6 cycles, at a 50% dose reduction, and once again had a excellent response.  She was on observation until August 2014, then had progression of disease, so was placed on ibrutinib 420 mg daily.  The ibrutinib had kept her disease under fairly good control, but then she develops severe bilateral lower extremity cellulitis in April 2016, so ibrutinib was placed on hold.  She had persistent cellulitis, as well as  Clostridium difficile colitis in May 2016 requiring hospitalization.  She was readmitted soon after discharge with worsening  cellulitis, requiring a prolonged hospitalization.  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 She eventually had resolution of the cellulitis  As she was largely asymptomatic, we continued observation until February 2017, at which time she had worsening splenomegaly.  Repeat CT imaging in February showed progressive disease, with marked splenomegaly and bulky lymphadenopathy.  She was then placed back on ibrutinib 420mg  daily.  The lymphocytosis, anemia and thrombocytopenia slowly improved.  Due to neutropenia, ibrutinib was placed on hold in December 2017.  The ibrutinib was resumed in January 2018, but discontinued in August 2018 due to toxicities, mainly severe abdominal muscle cramping.  She was hospitalized in early September 2018 with urinary tract infection and pancytopenia.  She received multiple red blood cell and platelet transfusions during her stay.  She was admitted again in late September 2018 with perineal cellulitis extending to the lower abdomen and upper thigh, as well as persistent pancytopenia.  CT abdomen and pelvis revealed interval decrease in the abdominal lymphadenopathy, chronic massive splenomegaly with scattered small splenic infarcts, and small left pleural effusion with bibasilar atelectasis.  She required packed red blood cells while hospitalized.  She was discharged home with home health and followed up with the [facility].  We continued to follow her closely, but did not place her on a treatment due to the persistent cellulitis.  She then was admitted in October 2018 with severe hypercalcemia, with a calcium of 15.  She was treated with IV fluids, zoledronic acid and Calcitonin injections.  We continued to monitor her closely.  She had recurrent  hypercalcemia, for which she received IV fluids and zolendronic acid as an outpatient.    Due to worsening pancytopenia, she underwent bone marrow  biopsy in January 2019.   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  The cellulitis had improved to a point we felt she could be treated again, so she was placed on venetoclax CLL in January 2019.  She has continued on venetoclax 400 mg daily after ramp up dosing and has tolerated that fairly well, except for mild diarrhea.  Since starting venetoclax, she has had decreasing splenomegaly and improvement in her pancytopenia.  She initially continued to require IV fluids and zoledronic acid for the hypercalcemia.  Her last dose of zoledronic acid was given in March 2019.  In March, she had an irregular heart rhythm and EKG revealed occasional premature atrial complexes, as well as an incomplete right bundle branch block.  This was stable compared to EKG done in October 2018.  Quantitative immunoglobulins done in August 2019 revealed a normal IgG, with low IgA and IgM.  She has continued IVIG monthly.  She had an episode of Campylobacter diarrhea in November 2019, which was treated with azithromycin for 7 days with resolution of her diarrhea.  She was evaluated by GI who recommended colonoscopy in May of next year.  Venetoclax was held temporarily until her diarrhea resolved.  She has since tolerated venetoclax without significant difficulty.    She completed a full 2 years of monotherapy with venetoclax in January 2021.  We recommended continuing venetoclax 400 mg daily until progression of disease or unacceptable toxicities.  Annual bilateral mammogram in Apri 2021 revealed a possible mass in the left breast, which warranted further evaluation. Right breast did not reveal any evidence of malignancy.  Unilateral diagnostic left mammogram in May, revealed benign fibrocystic change within the outer left breast with no evidence of malignancy.  Routine annual bilateral mammography was recommended.   In February of this year, she had a right  diagnostic mammogram due to right breast subareolar tenderness, which did not reveal any evidence of malignancy.  Bilateral screening mammogram was scheduled for May.  At her last visit, she reported left flank pain for about 3 weeks, stating she wakes up with it, but then it resolves after she takes her morning medications, including ibuprofen and oxycodone. She had been seen in urgent care and felt to possibly have urinary tract infection, but the urine was negative. CT abdomen did not reveal any acute findings to explain the left-sided abdominal pain.  There was resolution of previously seen gross splenomegaly.  There were prominent retroperitoneal lymph nodes which had significantly diminished in size compared to prior exam.  A hiatal hernia was seen.  There was descending colonic diverticulosis without evidence of acute diverticulitis.  INTERVAL HISTORY:  Samantha Warren is here today for repeat clinical assessment prior to her next IVIG. She states she continues venetoclax without significant difficulty. She reports mild fatigue.  She has intermittent diarrhea, for which Imodium is effective. She denies fevers, chills or night sweats.  She has continued chronic knee pain, right significantly worse than left, for which she uses oxycodone as needed, she usually takes this twice daily. She states her left flank pain is intermittent now and she feels it may be due to her mattress. Her appetite is good. Her weight has been stable.  Prior to her visit today she had a bilateral screening mammogram. She states she is scheduled for colonoscopy on June  06CB.  Her systolic blood pressure is elevated, but she feels this due to worry over her son's medical issues.  REVIEW OF SYSTEMS:  Review of Systems  Constitutional: Negative for appetite change, chills, fatigue, fever and unexpected weight change.  HENT:   Negative for lump/mass, mouth sores and sore throat.   Respiratory: Negative for cough and shortness of breath.    Cardiovascular: Negative for chest pain and leg swelling.  Gastrointestinal: Negative for abdominal pain, constipation, diarrhea, nausea and vomiting.  Genitourinary: Negative for difficulty urinating, dysuria, frequency and hematuria.   Musculoskeletal: Positive for arthralgias (Right knee pain). Negative for back pain and myalgias.  Skin: Negative for rash.  Neurological: Negative for dizziness and headaches.  Hematological: Negative for adenopathy. Does not bruise/bleed easily.  Psychiatric/Behavioral: Negative for depression and sleep disturbance. The patient is not nervous/anxious.    VITALS:  Blood pressure (!) 189/82, pulse 75, temperature 97.9 F (36.6 C), temperature source Oral, resp. rate 18, height 5' (1.524 m), weight 219 lb 3.2 oz (99.4 kg), SpO2 98 %.  Wt Readings from Last 3 Encounters:  08/29/20 219 lb 3.2 oz (99.4 kg)  08/01/20 220 lb 12 oz (100.1 kg)  07/31/20 219 lb 8 oz (99.6 kg)    Body mass index is 42.81 kg/m.  Performance status (ECOG): 1 - Symptomatic but completely ambulatory  PHYSICAL EXAM:  Physical Exam Vitals and nursing note reviewed.  Constitutional:      General: She is not in acute distress.    Appearance: Normal appearance.  HENT:     Head: Normocephalic and atraumatic.     Mouth/Throat:     Mouth: Mucous membranes are moist.     Pharynx: Oropharynx is clear. No oropharyngeal exudate or posterior oropharyngeal erythema.  Eyes:     General: No scleral icterus.    Extraocular Movements: Extraocular movements intact.     Conjunctiva/sclera: Conjunctivae normal.     Pupils: Pupils are equal, round, and reactive to light.  Cardiovascular:     Rate and Rhythm: Normal rate and regular rhythm.     Heart sounds: Normal heart sounds. No murmur heard. No friction rub. No gallop.   Pulmonary:     Effort: Pulmonary effort is normal.     Breath sounds: Normal breath sounds. No wheezing, rhonchi or rales.  Chest:  Breasts:     Right: No axillary  adenopathy or supraclavicular adenopathy.     Left: No axillary adenopathy or supraclavicular adenopathy.    Abdominal:     General: There is no distension.     Palpations: Abdomen is soft. There is no hepatomegaly, splenomegaly or mass.     Tenderness: There is no abdominal tenderness.  Musculoskeletal:        General: Normal range of motion.     Cervical back: Normal range of motion and neck supple. No tenderness.     Right lower leg: No edema.     Left lower leg: No edema.  Lymphadenopathy:     Cervical: No cervical adenopathy.     Upper Body:     Right upper body: No supraclavicular or axillary adenopathy.     Left upper body: No supraclavicular or axillary adenopathy.     Lower Body: No right inguinal adenopathy. No left inguinal adenopathy.  Skin:    General: Skin is warm and dry.     Coloration: Skin is not jaundiced.     Findings: No rash.  Neurological:     Mental Status: She is alert and  oriented to person, place, and time.     Cranial Nerves: No cranial nerve deficit.  Psychiatric:        Mood and Affect: Mood normal.        Behavior: Behavior normal.        Thought Content: Thought content normal.    LABS:   CBC Latest Ref Rng & Units 08/29/2020 07/31/2020 07/03/2020  WBC - 3.4 2.6 2.7  Hemoglobin 12.0 - 16.0 11.7(A) 11.8(A) 11.6(A)  Hematocrit 36 - 46 35(A) 36 35(A)  Platelets 150 - 399 62(A) 64(A) 61(A)   CMP Latest Ref Rng & Units 08/29/2020 07/31/2020 07/03/2020  BUN 4 - 21 21 17  23(A)  Creatinine 0.5 - 1.1 0.8 0.9 0.8  Sodium 137 - 147 139 140 138  Potassium 3.4 - 5.3 4.1 3.9 4.0  Chloride 99 - 108 106 107 108  CO2 13 - 22 27(A) 25(A) 25(A)  Calcium 8.7 - 10.7 9.0 8.7 8.8  Alkaline Phos 25 - 125 76 72 74  AST 13 - 35 27 25 27   ALT 7 - 35 24 22 25      No results found for: CEA1 / No results found for: CEA1 No results found for: PSA1 No results found for: DXI338 No results found for: SNK539  No results found for: TOTALPROTELP, ALBUMINELP, A1GS, A2GS,  BETS, BETA2SER, GAMS, MSPIKE, SPEI No results found for: TIBC, FERRITIN, IRONPCTSAT No results found for: LDH  STUDIES:  No results found.  Exam(s): [id number] CT/CT ABDOMEN W/IV CM CLINICAL DATA:  History of leukemia, CLL, left-sided abdominal pain and left flank pain for 3 weeks  EXAM: CT ABDOMEN WITH CONTRAST  TECHNIQUE: Multidetector CT imaging of the abdomen was performed using the standard protocol following bolus administration of intravenous contrast.  CONTRAST:  100 mL Isovue 370 iodinated contrast IV, additional oral enteric contrast  COMPARISON:  12/18/2016  FINDINGS: Lower chest: No acute abnormality. Bandlike scarring of the bilateral lung bases. Small hiatal hernia.  Hepatobiliary: No focal liver abnormality is seen. Status post cholecystectomy. No biliary dilatation.  Pancreas: Unremarkable. No pancreatic ductal dilatation or surrounding inflammatory changes.  Spleen: Resolution of previously seen gross splenomegaly.  Adrenals/Urinary Tract: Adrenal glands are unremarkable. Kidneys are normal, without renal calculi, focal lesion, or hydronephrosis. Bladder is unremarkable.  Stomach/Bowel: Stomach is within normal limits. Appendix appears normal. No evidence of bowel wall thickening, distention, or inflammatory changes. Descending colonic diverticulosis.  Vascular/Lymphatic: Aortic atherosclerosis. There are prominent retroperitoneal lymph nodes, significantly diminished in size compared to prior examination.  Other: No abdominal wall hernia or abnormality. No abdominopelvic ascites.  Musculoskeletal: No acute or significant osseous findings.  IMPRESSION: 1. No acute CT findings of the abdomen to explain left-sided abdominal pain. 2. Resolution of previously seen gross splenomegaly. 3. There are prominent retroperitoneal lymph nodes, significantly diminished in size compared to prior examination, consistent with treatment response of known  CLL. 4. Hiatal hernia. 5. Descending colonic diverticulosis without evidence of acute diverticulitis.  Aortic Atherosclerosis (ICD10-I70.0).  Exam(s): [id number] MAM/MAM DIGITAL TOMO SCREENING B CLINICAL DATA:  Screening.  EXAM: DIGITAL SCREENING BILATERAL MAMMOGRAM WITH TOMOSYNTHESIS AND CAD  TECHNIQUE: Bilateral screening digital craniocaudal and mediolateral oblique mammograms were obtained. Bilateral screening digital breast tomosynthesis was performed. The images were evaluated with computer-aided detection.  COMPARISON:  Previous exam(s).  ACR Breast Density Category b: There are scattered areas of fibroglandular density.  FINDINGS: In the left breast, a possible focal asymmetry warrants further evaluation. In the right breast, no findings suspicious for malignancy.  IMPRESSION: Further evaluation is suggested for possible focal asymmetry in the left breast.  RECOMMENDATION: Diagnostic mammogram and possibly ultrasound of the left breast. (Code:[id number])  The patient will be contacted regarding the findings, and additional imaging will be scheduled.  BI-RADS CATEGORY  0: Incomplete. Need additional imaging evaluation and/or prior mammograms for comparison.   HISTORY:   Past Medical History:  Diagnosis Date  . Cancer [facility])     Past Surgical History:  Procedure Laterality Date  . arm surgery    . GALLBLADDER SURGERY    . TOTAL KNEE ARTHROPLASTY      Family History  Problem Relation Age of Onset  . Cancer Mother   . Hypertension Father   . Hypertension Sister   . Stroke Sister   . Hypertension Brother   . Stroke Brother     Social History:  reports that she has been smoking. She has a 15.00 pack-year smoking history. She has never used smokeless tobacco. She reports current drug use. No history on file for alcohol use.The patient is alone today.  Allergies:  Allergies  Allergen Reactions  . Metronidazole   . Rituximab   . Sulfa Antibiotics      Current Medications: Current Outpatient Medications  Medication Sig Dispense Refill  . alendronate (FOSAMAX) 70 MG tablet     . Ascorbic Acid (VITAMIN C WITH ROSE HIPS) 500 MG tablet Take 500 mg by mouth daily.    . benzonatate (TESSALON) 200 MG capsule TAKE ONE CAPSULE 3 TIMES A DAY AS NEEDED FOR COUGH  5  . cyclobenzaprine (FLEXERIL) 5 MG tablet     . diclofenac Sodium (VOLTAREN) 1 % GEL Apply topically.    . hydrochlorothiazide (MICROZIDE) 12.5 MG capsule Take by mouth.    Marland Kitchen KLOR-CON M20 20 MEQ tablet     . lidocaine (LIDODERM) 5 % 2 patches daily.    Marland Kitchen lidocaine-prilocaine (EMLA) cream Apply 1 application topically as needed. 30 g 5  . NEOMYCIN-POLYMYXIN-HYDROCORTISONE (CORTISPORIN) 1 % SOLN otic solution     . omeprazole (PRILOSEC) 20 MG capsule Take 1 capsule (20 mg total) by mouth daily. 90 capsule 3  . oxyCODONE (ROXICODONE) 15 MG immediate release tablet Take 15 mg by mouth every 6 (six) hours as needed.    . prochlorperazine (COMPAZINE) 10 MG tablet     . VENCLEXTA 100 MG tablet Take 400 mg by mouth daily.    . Vibegron (GEMTESA) 75 MG TABS Take 75 mg by mouth at bedtime.      No current facility-administered medications for this visit.     ASSESSMENT & PLAN:   Assessment:  1. CLL, well controlled with venetoclax 400mg  daily, which she will continue. Her blood counts remain fairly stable.  2. 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    3. 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  4. Left flank pain, CT abdomen did not elicit in etiology.  This is less severe and only intermittent.  5. Possible asymmetry in the left breast on screening mammogram.  We will set her up for left diagnostic mammogram and possible ultrasound.  Plan:  She will proceed with IVIG tomorrow.  She knows to continue venetoclax daily.  She will proceed with colonoscopy in June. I will plan to see  her back in 4 weeks with a CBC and comprehensive metabolic panel prior to her next IVIG. The patient understands the plans discussed today and is in agreement with  them.  She knows to contact our office if she develops concerns prior to her next appointment.   [name], PA-C

## 2020-08-29 ENCOUNTER — Encounter: Payer: Self-pay | Admitting: Hematology and Oncology

## 2020-08-29 ENCOUNTER — Telehealth: Payer: Self-pay | Admitting: Hematology and Oncology

## 2020-08-29 ENCOUNTER — Inpatient Hospital Stay: Payer: Medicare Other

## 2020-08-29 ENCOUNTER — Other Ambulatory Visit: Payer: Self-pay

## 2020-08-29 ENCOUNTER — Inpatient Hospital Stay (INDEPENDENT_AMBULATORY_CARE_PROVIDER_SITE_OTHER): Payer: Medicare Other | Admitting: Hematology and Oncology

## 2020-08-29 VITALS — BP 189/82 | HR 75 | Temp 97.9°F | Resp 18 | Ht 60.0 in | Wt 219.2 lb

## 2020-08-29 DIAGNOSIS — R928 Other abnormal and inconclusive findings on diagnostic imaging of breast: Secondary | ICD-10-CM

## 2020-08-29 DIAGNOSIS — C9110 Chronic lymphocytic leukemia of B-cell type not having achieved remission: Secondary | ICD-10-CM | POA: Diagnosis not present

## 2020-08-29 DIAGNOSIS — D801 Nonfamilial hypogammaglobulinemia: Secondary | ICD-10-CM | POA: Diagnosis not present

## 2020-08-29 DIAGNOSIS — D819 Combined immunodeficiency, unspecified: Secondary | ICD-10-CM

## 2020-08-29 LAB — HEPATIC FUNCTION PANEL
ALT: 24 (ref 7–35)
AST: 27 (ref 13–35)
Alkaline Phosphatase: 76 (ref 25–125)
Bilirubin, Total: 0.4

## 2020-08-29 LAB — BASIC METABOLIC PANEL
BUN: 21 (ref 4–21)
CO2: 27 — AB (ref 13–22)
Chloride: 106 (ref 99–108)
Creatinine: 0.8 (ref 0.5–1.1)
Glucose: 100
Potassium: 4.1 (ref 3.4–5.3)
Sodium: 139 (ref 137–147)

## 2020-08-29 LAB — CBC AND DIFFERENTIAL
HCT: 35 — AB (ref 36–46)
Hemoglobin: 11.7 — AB (ref 12.0–16.0)
Neutrophils Absolute: 1.63
Platelets: 62 — AB (ref 150–399)
WBC: 3.4

## 2020-08-29 LAB — COMPREHENSIVE METABOLIC PANEL
Albumin: 4.3 (ref 3.5–5.0)
Calcium: 9 (ref 8.7–10.7)

## 2020-08-29 LAB — CBC: RBC: 3.85 — AB (ref 3.87–5.11)

## 2020-08-29 MED ORDER — HEPARIN SOD (PORK) LOCK FLUSH 100 UNIT/ML IV SOLN
500.0000 [IU] | Freq: Once | INTRAVENOUS | Status: AC | PRN
  Administered 2020-08-29: 500 [IU]
  Filled 2020-08-29: qty 5

## 2020-08-29 MED ORDER — SODIUM CHLORIDE 0.9% FLUSH
10.0000 mL | INTRAVENOUS | Status: DC | PRN
  Administered 2020-08-29: 10 mL
  Filled 2020-08-29: qty 10

## 2020-08-29 MED FILL — Immune Globulin (Human) IV Soln 10 GM/100ML: INTRAVENOUS | Qty: 450 | Status: AC

## 2020-08-29 NOTE — Telephone Encounter (Signed)
Per 5/31 los next appt scheduled and given to patient 

## 2020-08-30 ENCOUNTER — Inpatient Hospital Stay: Payer: Medicare Other | Attending: Hematology and Oncology

## 2020-08-30 ENCOUNTER — Other Ambulatory Visit: Payer: Self-pay

## 2020-08-30 VITALS — BP 123/55 | HR 72 | Temp 98.0°F | Resp 18 | Ht 60.0 in | Wt 219.8 lb

## 2020-08-30 DIAGNOSIS — F17210 Nicotine dependence, cigarettes, uncomplicated: Secondary | ICD-10-CM | POA: Insufficient documentation

## 2020-08-30 DIAGNOSIS — D819 Combined immunodeficiency, unspecified: Secondary | ICD-10-CM | POA: Diagnosis present

## 2020-08-30 DIAGNOSIS — Z79899 Other long term (current) drug therapy: Secondary | ICD-10-CM | POA: Insufficient documentation

## 2020-08-30 DIAGNOSIS — M170 Bilateral primary osteoarthritis of knee: Secondary | ICD-10-CM | POA: Diagnosis not present

## 2020-08-30 DIAGNOSIS — R928 Other abnormal and inconclusive findings on diagnostic imaging of breast: Secondary | ICD-10-CM | POA: Insufficient documentation

## 2020-08-30 DIAGNOSIS — D801 Nonfamilial hypogammaglobulinemia: Secondary | ICD-10-CM

## 2020-08-30 MED ORDER — HEPARIN SOD (PORK) LOCK FLUSH 100 UNIT/ML IV SOLN
500.0000 [IU] | Freq: Once | INTRAVENOUS | Status: AC | PRN
  Administered 2020-08-30: 500 [IU]
  Filled 2020-08-30: qty 5

## 2020-08-30 MED ORDER — IMMUNE GLOBULIN (HUMAN) 10 GM/100ML IV SOLN
1.0000 g/kg | Freq: Once | INTRAVENOUS | Status: AC
  Administered 2020-08-30: 45 g via INTRAVENOUS
  Filled 2020-08-30: qty 450

## 2020-08-30 MED ORDER — DIPHENHYDRAMINE HCL 50 MG/ML IJ SOLN
INTRAMUSCULAR | Status: AC
  Filled 2020-08-30: qty 1

## 2020-08-30 MED ORDER — DEXTROSE 5 % IV SOLN
Freq: Once | INTRAVENOUS | Status: AC
  Filled 2020-08-30: qty 250

## 2020-08-30 MED ORDER — DIPHENHYDRAMINE HCL 50 MG/ML IJ SOLN
25.0000 mg | Freq: Once | INTRAMUSCULAR | Status: AC
  Administered 2020-08-30: 25 mg via INTRAVENOUS

## 2020-08-30 MED ORDER — ACETAMINOPHEN 325 MG PO TABS
650.0000 mg | ORAL_TABLET | Freq: Once | ORAL | Status: AC
  Administered 2020-08-30: 650 mg via ORAL

## 2020-08-30 MED ORDER — ACETAMINOPHEN 325 MG PO TABS
ORAL_TABLET | ORAL | Status: AC
  Filled 2020-08-30: qty 2

## 2020-08-30 NOTE — Patient Instructions (Signed)
Immune Globulin Injection What is this medicine? IMMUNE GLOBULIN (im MUNE GLOB yoo lin) helps to prevent or reduce the severity of certain infections in patients who are at risk. This medicine is collected from the pooled blood of many donors. It is used to treat immune system problems, thrombocytopenia, and Kawasaki syndrome. This medicine may be used for other purposes; ask your health care provider or pharmacist if you have questions. COMMON BRAND NAME(S): ASCENIV, Baygam, BIVIGAM, Carimune, Carimune NF, cutaquig, Cuvitru, Flebogamma, Flebogamma DIF, GamaSTAN, GamaSTAN S/D, Gamimune N, Gammagard, Gammagard S/D, Gammaked, Gammaplex, Gammar-P IV, Gamunex, Gamunex-C, Hizentra, Iveegam, Iveegam EN, Octagam, Panglobulin, Panglobulin NF, panzyga, Polygam S/D, Privigen, Sandoglobulin, Venoglobulin-S, Vigam, Vivaglobulin, Xembify What should I tell my health care provider before I take this medicine? They need to know if you have any of these conditions:  diabetes  extremely low or no immune antibodies in the blood  heart disease  history of blood clots  hyperprolinemia  infection in the blood, sepsis  kidney disease  recently received or scheduled to receive a vaccination  an unusual or allergic reaction to human immune globulin, albumin, maltose, sucrose, other medicines, foods, dyes, or preservatives  pregnant or trying to get pregnant  breast-feeding How should I use this medicine? This medicine is for injection into a muscle or infusion into a vein or skin. It is usually given by a health care professional in a hospital or clinic setting. In rare cases, some brands of this medicine might be given at home. You will be taught how to give this medicine. Use exactly as directed. Take your medicine at regular intervals. Do not take your medicine more often than directed. Talk to your pediatrician regarding the use of this medicine in children. While this drug may be prescribed for selected  conditions, precautions do apply. Overdosage: If you think you have taken too much of this medicine contact a poison control center or emergency room at once. NOTE: This medicine is only for you. Do not share this medicine with others. What if I miss a dose? It is important not to miss your dose. Call your doctor or health care professional if you are unable to keep an appointment. If you give yourself the medicine and you miss a dose, take it as soon as you can. If it is almost time for your next dose, take only that dose. Do not take double or extra doses. What may interact with this medicine?  aspirin and aspirin-like medicines  cisplatin  cyclosporine  medicines for infection like acyclovir, adefovir, amphotericin B, bacitracin, cidofovir, foscarnet, ganciclovir, gentamicin, pentamidine, vancomycin  NSAIDS, medicines for pain and inflammation, like ibuprofen or naproxen  pamidronate  vaccines  zoledronic acid This list may not describe all possible interactions. Give your health care provider a list of all the medicines, herbs, non-prescription drugs, or dietary supplements you use. Also tell them if you smoke, drink alcohol, or use illegal drugs. Some items may interact with your medicine. What should I watch for while using this medicine? Your condition will be monitored carefully while you are receiving this medicine. This medicine is made from pooled blood donations of many different people. It may be possible to pass an infection in this medicine. However, the donors are screened for infections and all products are tested for HIV and hepatitis. The medicine is treated to kill most or all bacteria and viruses. Talk to your doctor about the risks and benefits of this medicine. Do not have vaccinations for at least   14 days before, or until at least 3 months after receiving this medicine. What side effects may I notice from receiving this medicine? Side effects that you should report  to your doctor or health care professional as soon as possible:  allergic reactions like skin rash, itching or hives, swelling of the face, lips, or tongue  blue colored lips or skin  breathing problems  chest pain or tightness  fever  signs and symptoms of aseptic meningitis such as stiff neck; sensitivity to light; headache; drowsiness; fever; nausea; vomiting; rash  signs and symptoms of a blood clot such as chest pain; shortness of breath; pain, swelling, or warmth in the leg  signs and symptoms of hemolytic anemia such as fast heartbeat; tiredness; dark yellow or brown urine; or yellowing of the eyes or skin  signs and symptoms of kidney injury like trouble passing urine or change in the amount of urine  sudden weight gain  swelling of the ankles, feet, hands Side effects that usually do not require medical attention (report to your doctor or health care professional if they continue or are bothersome):  diarrhea  flushing  headache  increased sweating  joint pain  muscle cramps  muscle pain  nausea  pain, redness, or irritation at site where injected  tiredness This list may not describe all possible side effects. Call your doctor for medical advice about side effects. You may report side effects to FDA at 1-800-FDA-1088. Where should I keep my medicine? Keep out of the reach of children. This drug is usually given in a hospital or clinic and will not be stored at home. In rare cases, some brands of this medicine may be given at home. If you are using this medicine at home, you will be instructed on how to store this medicine. Throw away any unused medicine after the expiration date on the label. NOTE: This sheet is a summary. It may not cover all possible information. If you have questions about this medicine, talk to your doctor, pharmacist, or health care provider.  2021 Elsevier/Gold Standard (2018-10-21 12:51:14)  

## 2020-09-21 ENCOUNTER — Encounter: Payer: Self-pay | Admitting: Oncology

## 2020-09-26 ENCOUNTER — Inpatient Hospital Stay (INDEPENDENT_AMBULATORY_CARE_PROVIDER_SITE_OTHER): Payer: Medicare Other | Admitting: Hematology and Oncology

## 2020-09-26 ENCOUNTER — Encounter: Payer: Self-pay | Admitting: Hematology and Oncology

## 2020-09-26 ENCOUNTER — Other Ambulatory Visit: Payer: Self-pay

## 2020-09-26 ENCOUNTER — Other Ambulatory Visit: Payer: Self-pay | Admitting: Pharmacist

## 2020-09-26 ENCOUNTER — Telehealth: Payer: Self-pay | Admitting: Hematology and Oncology

## 2020-09-26 ENCOUNTER — Inpatient Hospital Stay: Payer: Medicare Other

## 2020-09-26 VITALS — BP 166/79 | HR 76 | Temp 98.3°F | Resp 20 | Ht 60.0 in | Wt 219.7 lb

## 2020-09-26 DIAGNOSIS — C9110 Chronic lymphocytic leukemia of B-cell type not having achieved remission: Secondary | ICD-10-CM | POA: Diagnosis not present

## 2020-09-26 DIAGNOSIS — D819 Combined immunodeficiency, unspecified: Secondary | ICD-10-CM

## 2020-09-26 DIAGNOSIS — D801 Nonfamilial hypogammaglobulinemia: Secondary | ICD-10-CM

## 2020-09-26 LAB — BASIC METABOLIC PANEL
BUN: 22 — AB (ref 4–21)
CO2: 27 — AB (ref 13–22)
Chloride: 105 (ref 99–108)
Creatinine: 0.8 (ref 0.5–1.1)
Glucose: 99
Potassium: 3.9 (ref 3.4–5.3)
Sodium: 140 (ref 137–147)

## 2020-09-26 LAB — HEPATIC FUNCTION PANEL
ALT: 21 (ref 7–35)
AST: 27 (ref 13–35)
Alkaline Phosphatase: 79 (ref 25–125)
Bilirubin, Total: 0.4

## 2020-09-26 LAB — COMPREHENSIVE METABOLIC PANEL
Albumin: 4.2 (ref 3.5–5.0)
Calcium: 8.7 (ref 8.7–10.7)

## 2020-09-26 LAB — CBC AND DIFFERENTIAL
HCT: 36 (ref 36–46)
Hemoglobin: 11.9 — AB (ref 12.0–16.0)
Neutrophils Absolute: 1.5
Platelets: 68 — AB (ref 150–399)
WBC: 3

## 2020-09-26 LAB — CBC: RBC: 3.95 (ref 3.87–5.11)

## 2020-09-26 MED ORDER — HEPARIN SOD (PORK) LOCK FLUSH 100 UNIT/ML IV SOLN
500.0000 [IU] | Freq: Once | INTRAVENOUS | Status: AC | PRN
  Administered 2020-09-26: 500 [IU]
  Filled 2020-09-26: qty 5

## 2020-09-26 MED ORDER — SODIUM CHLORIDE 0.9% FLUSH
10.0000 mL | INTRAVENOUS | Status: AC | PRN
  Administered 2020-09-26: 10 mL
  Filled 2020-09-26: qty 10

## 2020-09-26 NOTE — Progress Notes (Signed)
[facility]  [address] 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  She was on observation until November 2008. She was initially treated with chlorambucil and prednisone, as she refused intravenous chemotherapy.  She had a partial response to this regimen.  By January 2010, she had progressive disease with a white count over 200,000, with associated anemia, splenomegaly, thrombocytopenia, and adenopathy.  She then received 5 cycles of fludarabine with good response.  She did well until January 2012, when she had progression once again.  She had a single dose of bendamustine and rituximab, which kept her disease under control for over a year.  However, she had a severe allergic reaction to the rituximab, so has not received further rituximab.  She was hospitalized after the bendamustine because of severe pancytopenia and required multiple transfusions, so was placed on observation.  In March 2013, she had progression of disease again, so was treated with bendamustine for 6 cycles, at a 50% dose reduction, and once again had a excellent response.  She was on observation until August 2014, then had progression of disease, so was placed on ibrutinib 420 mg daily.  The ibrutinib had kept her disease under fairly good control, but then she develops severe bilateral lower extremity cellulitis in April 2016, so ibrutinib was placed on hold.  She had persistent cellulitis, as well as  Clostridium difficile colitis in May 2016 requiring hospitalization.  She was readmitted soon after discharge with worsening  cellulitis, requiring a prolonged hospitalization.  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 She eventually had resolution of the cellulitis   As she was largely asymptomatic, we continued observation until February 2017, at which time she had worsening splenomegaly.  Repeat CT imaging in February showed progressive disease, with marked splenomegaly and bulky lymphadenopathy.  She was then placed back on ibrutinib 420mg  daily.  The lymphocytosis, anemia and thrombocytopenia slowly improved.  Due to neutropenia, ibrutinib was placed on hold in December 2017.  The ibrutinib was resumed in January 2018, but discontinued in August 2018 due to toxicities, mainly severe abdominal muscle cramping.  She was hospitalized in early September 2018 with urinary tract infection and pancytopenia.  She received multiple red blood cell and platelet transfusions during her stay.  She was admitted again in late September 2018 with perineal cellulitis extending to the lower abdomen and upper thigh, as well as persistent pancytopenia.  CT abdomen and pelvis revealed interval decrease in the abdominal lymphadenopathy, chronic massive splenomegaly with scattered small splenic infarcts, and small left pleural effusion with bibasilar atelectasis.  She required packed red blood cells while hospitalized.  She was discharged home with home health and followed up with the [facility].  We continued to follow her closely, but did not place her on a treatment due to the persistent cellulitis.  She then was admitted in October 2018 with severe hypercalcemia, with a calcium of 15.  She was treated with IV fluids, zoledronic acid and Calcitonin injections.  We continued to monitor her closely.  She had  recurrent hypercalcemia, for which she received IV fluids and zolendronic acid as an outpatient.     Due to worsening pancytopenia, she underwent bone marrow  biopsy in January 2019.   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  The cellulitis had improved to a point we felt she could be treated again, so she was placed on venetoclax CLL in January 2019.  She has continued on venetoclax 400 mg daily after ramp up dosing and has tolerated that fairly well, except for mild diarrhea.  Since starting venetoclax, she has had decreasing splenomegaly and improvement in her pancytopenia.  She initially continued to require IV fluids and zoledronic acid for the hypercalcemia.  Her last dose of zoledronic acid was given in March 2019.  In March, she had an irregular heart rhythm and EKG revealed occasional premature atrial complexes, as well as an incomplete right bundle branch block.  This was stable compared to EKG done in October 2018.  Quantitative immunoglobulins done in August 2019 revealed a normal IgG, with low IgA and IgM.  She has continued IVIG monthly.  She had an episode of Campylobacter diarrhea in November 2019, which was treated with azithromycin for 7 days with resolution of her diarrhea.  She was evaluated by GI who recommended colonoscopy in May of next year.  Venetoclax was held temporarily until her diarrhea resolved.  She has since tolerated venetoclax without significant difficulty.     She completed a full 2 years of monotherapy with venetoclax in January 2021.  We recommended continuing venetoclax 400 mg daily until progression of disease or unacceptable toxicities.  Annual bilateral mammogram in Apri 2021 revealed a possible mass in the left breast, which warranted further evaluation. Right breast did not reveal any evidence of malignancy.  Unilateral diagnostic left mammogram in May, revealed benign fibrocystic change within the outer left breast with no evidence of malignancy.  Routine annual bilateral mammography was recommended.   In February of this year, she had a right  diagnostic mammogram due to right breast subareolar tenderness, which did not reveal any evidence of malignancy.  Bilateral screening mammogram was scheduled for May.  In April, she reported left flank pain for about 3 weeks, stating she wakes up with it, but then it resolves after she takes her morning medications, including ibuprofen and oxycodone. She had been seen in urgent care and felt to possibly have urinary tract infection, but the urine was negative. CT abdomen did not reveal any acute abnormality or other findings to explain the left-sided abdominal pain.  There was resolution of previously seen gross splenomegaly.  There were prominent retroperitoneal lymph nodes which had significantly diminished in size compared to prior exam.  A hiatal hernia was seen. There was descending colonic diverticulosis without evidence of acute diverticulitis.  Bilateral screening mammogram in May revealed possible asymmetry in the left breast, so she is scheduled for a left diagnostic mammogram and possible ultrasound on June 29th. The left flank pain was improving at her last visit and felt to be musculoskeletal.  INTERVAL HISTORY:  Samantha Warren is here today for repeat clinical assessment prior to her next IVIG. She states she continues venetoclax without significant difficulty. She denies fevers, chills or night sweats.  She has continued chronic knee pain, right significantly worse than left, for which she uses oxycodone as needed. Her appetite is good. Her weight has been stable. She underwent colonoscopy on June 16th which revealed diverticulosis. No polyps were seen. Repeat  in 10 years was recommended.    REVIEW OF SYSTEMS:  Review of Systems  Constitutional:  Negative for appetite change, chills, fatigue, fever and unexpected weight change.  HENT:   Negative for lump/mass, mouth sores and sore throat.   Respiratory:  Negative for cough and shortness of breath.   Cardiovascular:  Negative for chest pain and leg  swelling.  Gastrointestinal:  Negative for abdominal pain, constipation, diarrhea, nausea and vomiting.  Genitourinary:  Negative for difficulty urinating, dysuria, frequency and hematuria.   Musculoskeletal:  Positive for arthralgias (Right knee pain). Negative for back pain and myalgias.  Skin:  Negative for rash.  Neurological:  Negative for dizziness and headaches.  Hematological:  Negative for adenopathy. Does not bruise/bleed easily.  Psychiatric/Behavioral:  Negative for depression and sleep disturbance. The patient is not nervous/anxious.   VITALS:  Blood pressure (!) 166/79, pulse 76, temperature 98.3 F (36.8 C), temperature source Oral, resp. rate 20, height 5' (1.524 m), weight 219 lb 11.2 oz (99.7 kg), SpO2 95 %.  Wt Readings from Last 3 Encounters:  09/26/20 219 lb 11.2 oz (99.7 kg)  08/30/20 219 lb 12 oz (99.7 kg)  08/29/20 219 lb 3.2 oz (99.4 kg)    Body mass index is 42.91 kg/m.  Performance status (ECOG): 1 - Symptomatic but completely ambulatory  PHYSICAL EXAM:  Physical Exam Vitals and nursing note reviewed.  Constitutional:      General: She is not in acute distress.    Appearance: Normal appearance.  HENT:     Head: Normocephalic and atraumatic.     Mouth/Throat:     Mouth: Mucous membranes are moist.     Pharynx: Oropharynx is clear. No oropharyngeal exudate or posterior oropharyngeal erythema.  Eyes:     General: No scleral icterus.    Extraocular Movements: Extraocular movements intact.     Conjunctiva/sclera: Conjunctivae normal.     Pupils: Pupils are equal, round, and reactive to light.  Cardiovascular:     Rate and Rhythm: Normal rate and regular rhythm.     Heart sounds: Normal heart sounds. No murmur heard.   No friction rub. No gallop.  Pulmonary:     Effort: Pulmonary effort is normal.     Breath sounds: Normal breath sounds. No wheezing, rhonchi or rales.  Chest:  Breasts:    Right: No axillary adenopathy or supraclavicular adenopathy.      Left: No axillary adenopathy or supraclavicular adenopathy.  Abdominal:     General: There is no distension.     Palpations: Abdomen is soft. There is no hepatomegaly, splenomegaly or mass.     Tenderness: There is no abdominal tenderness.  Musculoskeletal:        General: Normal range of motion.     Cervical back: Normal range of motion and neck supple. No tenderness.     Right lower leg: No edema.     Left lower leg: No edema.  Lymphadenopathy:     Cervical: No cervical adenopathy.     Upper Body:     Right upper body: No supraclavicular or axillary adenopathy.     Left upper body: No supraclavicular or axillary adenopathy.     Lower Body: No right inguinal adenopathy. No left inguinal adenopathy.  Skin:    General: Skin is warm and dry.     Coloration: Skin is not jaundiced.     Findings: No rash.  Neurological:     Mental Status: She is alert and oriented to person, place, and time.  Cranial Nerves: No cranial nerve deficit.  Psychiatric:        Mood and Affect: Mood normal.        Behavior: Behavior normal.        Thought Content: Thought content normal.   LABS:   CBC Latest Ref Rng & Units 09/26/2020 08/29/2020 07/31/2020  WBC - 3.0 3.4 2.6  Hemoglobin 12.0 - 16.0 11.9(A) 11.7(A) 11.8(A)  Hematocrit 36 - 46 36 35(A) 36  Platelets 150 - 399 68(A) 62(A) 64(A)   CMP Latest Ref Rng & Units 09/26/2020 08/29/2020 07/31/2020  BUN 4 - 21 22(A) 21 17  Creatinine 0.5 - 1.1 0.8 0.8 0.9  Sodium 137 - 147 140 139 140  Potassium 3.4 - 5.3 3.9 4.1 3.9  Chloride 99 - 108 105 106 107  CO2 13 - 22 27(A) 27(A) 25(A)  Calcium 8.7 - 10.7 8.7 9.0 8.7  Alkaline Phos 25 - 125 79 76 72  AST 13 - 35 27 27 25   ALT 7 - 35 21 24 22      No results found for: CEA1 / No results found for: CEA1 No results found for: PSA1 No results found for: VVO160 No results found for: VPX106  No results found for: TOTALPROTELP, ALBUMINELP, A1GS, A2GS, BETS, BETA2SER, GAMS, MSPIKE, SPEI No results found  for: TIBC, FERRITIN, IRONPCTSAT No results found for: LDH  STUDIES:  No results found.   HISTORY:   Past Medical History:  Diagnosis Date   Cancer [facility])     Past Surgical History:  Procedure Laterality Date   arm surgery     GALLBLADDER SURGERY     TOTAL KNEE ARTHROPLASTY      Family History  Problem Relation Age of Onset   Cancer Mother    Hypertension Father    Hypertension Sister    Stroke Sister    Hypertension Brother    Stroke Brother     Social History:  reports that she has been smoking. She has a 15.00 pack-year smoking history. She has never used smokeless tobacco. She reports current drug use. No history on file for alcohol use.The patient is alone today.  Allergies:  Allergies  Allergen Reactions   Metronidazole    Rituximab    Sulfa Antibiotics     Current Medications: Current Outpatient Medications  Medication Sig Dispense Refill   alendronate (FOSAMAX) 70 MG tablet      Ascorbic Acid (VITAMIN C WITH ROSE HIPS) 500 MG tablet Take 500 mg by mouth daily.     benzonatate (TESSALON) 200 MG capsule TAKE ONE CAPSULE 3 TIMES A DAY AS NEEDED FOR COUGH  5   cyclobenzaprine (FLEXERIL) 5 MG tablet      diclofenac Sodium (VOLTAREN) 1 % GEL Apply topically.     hydrochlorothiazide (MICROZIDE) 12.5 MG capsule Take by mouth.     KLOR-CON M20 20 MEQ tablet      lidocaine (LIDODERM) 5 % 2 patches daily.     lidocaine-prilocaine (EMLA) cream Apply 1 application topically as needed. 30 g 5   NEOMYCIN-POLYMYXIN-HYDROCORTISONE (CORTISPORIN) 1 % SOLN otic solution      omeprazole (PRILOSEC) 20 MG capsule Take 1 capsule (20 mg total) by mouth daily. 90 capsule 3   oxyCODONE (ROXICODONE) 15 MG immediate release tablet Take 15 mg by mouth every 6 (six) hours as needed.     prochlorperazine (COMPAZINE) 10 MG tablet      VENCLEXTA 100 MG tablet Take 400 mg by mouth daily.     Vibegron (  GEMTESA) 75 MG TABS Take 75 mg by mouth at bedtime.      No current  facility-administered medications for this visit.     ASSESSMENT & PLAN:   Assessment:   1. CLL, well controlled with venetoclax 400mg  daily, which she will continue. Her blood counts remain stable.   2. 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     3. 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  4. Left flank pain, which has resolved. CT abdomen did not elicit in etiology.   5. Possible asymmetry in the left breast on screening mammogram. She is scheduled for left diagnostic mammogram and possible ultrasound on June 29th.   Plan:  She will proceed with IVIG tomorrow.  She knows to continue venetoclax daily. I will plan to see her back in 4 weeks with a CBC and comprehensive metabolic panel prior to her next IVIG. The patient understands the plans discussed today and is in agreement with them.  She knows to contact our office if she develops concerns prior to her next appointment.   [name], PA-C

## 2020-09-26 NOTE — Telephone Encounter (Signed)
Per 6/28 los next appt scheduled and given to patient.

## 2020-09-26 NOTE — Addendum Note (Signed)
Addended byGeorgette Shell on: 09/26/2020 10:50 AM   Modules accepted: Orders

## 2020-09-27 ENCOUNTER — Inpatient Hospital Stay: Payer: Medicare Other

## 2020-09-27 VITALS — BP 142/63 | HR 74 | Temp 97.6°F | Resp 18 | Ht 60.0 in | Wt 218.2 lb

## 2020-09-27 DIAGNOSIS — D819 Combined immunodeficiency, unspecified: Secondary | ICD-10-CM | POA: Diagnosis not present

## 2020-09-27 DIAGNOSIS — D801 Nonfamilial hypogammaglobulinemia: Secondary | ICD-10-CM

## 2020-09-27 MED ORDER — IMMUNE GLOBULIN (HUMAN) 10 GM/100ML IV SOLN
1.0000 g/kg | Freq: Once | INTRAVENOUS | Status: AC
  Administered 2020-09-27: 45 g via INTRAVENOUS
  Filled 2020-09-27: qty 450

## 2020-09-27 MED ORDER — DIPHENHYDRAMINE HCL 50 MG/ML IJ SOLN
INTRAMUSCULAR | Status: AC
  Filled 2020-09-27: qty 1

## 2020-09-27 MED ORDER — ACETAMINOPHEN 325 MG PO TABS
ORAL_TABLET | ORAL | Status: AC
  Filled 2020-09-27: qty 1

## 2020-09-27 MED ORDER — DIPHENHYDRAMINE HCL 50 MG/ML IJ SOLN
25.0000 mg | Freq: Once | INTRAMUSCULAR | Status: AC
  Administered 2020-09-27: 25 mg via INTRAVENOUS

## 2020-09-27 MED ORDER — HEPARIN SOD (PORK) LOCK FLUSH 100 UNIT/ML IV SOLN
500.0000 [IU] | Freq: Once | INTRAVENOUS | Status: AC | PRN
  Administered 2020-09-27: 500 [IU]
  Filled 2020-09-27: qty 5

## 2020-09-27 MED ORDER — ACETAMINOPHEN 325 MG PO TABS
650.0000 mg | ORAL_TABLET | Freq: Once | ORAL | Status: AC
  Administered 2020-09-27: 650 mg via ORAL

## 2020-09-27 MED ORDER — DEXTROSE 5 % IV SOLN
Freq: Once | INTRAVENOUS | Status: AC
  Filled 2020-09-27: qty 250

## 2020-09-27 NOTE — Patient Instructions (Signed)
Immune Globulin Injection What is this medication? IMMUNE GLOBULIN (im MUNE GLOB yoo lin) helps to prevent or reduce the severity of certain infections in patients who are at risk. This medicine is collected from the pooled blood of many donors. It is used to treat immune systemproblems, thrombocytopenia, and Kawasaki syndrome. This medicine may be used for other purposes; ask your health care provider orpharmacist if you have questions. COMMON BRAND NAME(S): ASCENIV, Baygam, BIVIGAM, Carimune, Carimune NF, cutaquig, Cuvitru, Flebogamma, Flebogamma DIF, GamaSTAN, GamaSTAN S/D, Gamimune N, Gammagard, Gammagard S/D, Gammaked, Gammaplex, Gammar-P IV, Gamunex, Gamunex-C, Hizentra, Iveegam, Iveegam EN, Octagam, Panglobulin, Panglobulin NF, panzyga, Polygam S/D, Privigen, Sandoglobulin, Venoglobulin-S, Vigam,Vivaglobulin, Xembify What should I tell my care team before I take this medication? They need to know if you have any of these conditions: diabetes extremely low or no immune antibodies in the blood heart disease history of blood clots hyperprolinemia infection in the blood, sepsis kidney disease recently received or scheduled to receive a vaccination an unusual or allergic reaction to human immune globulin, albumin, maltose, sucrose, other medicines, foods, dyes, or preservatives pregnant or trying to get pregnant breast-feeding How should I use this medication? This medicine is for injection into a muscle or infusion into a vein or skin. It is usually given by a health care professional in a hospital or clinicsetting. In rare cases, some brands of this medicine might be given at home. You will be taught how to give this medicine. Use exactly as directed. Take your medicineat regular intervals. Do not take your medicine more often than directed. Talk to your pediatrician regarding the use of this medicine in children. Whilethis drug may be prescribed for selected conditions, precautions do  apply. Overdosage: If you think you have taken too much of this medicine contact apoison control center or emergency room at once. NOTE: This medicine is only for you. Do not share this medicine with others. What if I miss a dose? It is important not to miss your dose. Call your doctor or health care professional if you are unable to keep an appointment. If you give yourself the medicine and you miss a dose, take it as soon as you can. If it is almost timefor your next dose, take only that dose. Do not take double or extra doses. What may interact with this medication? aspirin and aspirin-like medicines cisplatin cyclosporine medicines for infection like acyclovir, adefovir, amphotericin B, bacitracin, cidofovir, foscarnet, ganciclovir, gentamicin, pentamidine, vancomycin NSAIDS, medicines for pain and inflammation, like ibuprofen or naproxen pamidronate vaccines zoledronic acid This list may not describe all possible interactions. Give your health care provider a list of all the medicines, herbs, non-prescription drugs, or dietary supplements you use. Also tell them if you smoke, drink alcohol, or use illegaldrugs. Some items may interact with your medicine. What should I watch for while using this medication? Your condition will be monitored carefully while you are receiving thismedicine. This medicine is made from pooled blood donations of many different people. It may be possible to pass an infection in this medicine. However, the donors are screened for infections and all products are tested for HIV and hepatitis. The medicine is treated to kill most or all bacteria and viruses. Talk to yourdoctor about the risks and benefits of this medicine. Do not have vaccinations for at least 14 days before, or until at least 3months after receiving this medicine. What side effects may I notice from receiving this medication? Side effects that you should report to your doctor   or health care  professionalas soon as possible: allergic reactions like skin rash, itching or hives, swelling of the face, lips, or tongue blue colored lips or skin breathing problems chest pain or tightness fever signs and symptoms of aseptic meningitis such as stiff neck; sensitivity to light; headache; drowsiness; fever; nausea; vomiting; rash signs and symptoms of a blood clot such as chest pain; shortness of breath; pain, swelling, or warmth in the leg signs and symptoms of hemolytic anemia such as fast heartbeat; tiredness; dark yellow or brown urine; or yellowing of the eyes or skin signs and symptoms of kidney injury like trouble passing urine or change in the amount of urine sudden weight gain swelling of the ankles, feet, hands Side effects that usually do not require medical attention (report to yourdoctor or health care professional if they continue or are bothersome): diarrhea flushing headache increased sweating joint pain muscle cramps muscle pain nausea pain, redness, or irritation at site where injected tiredness This list may not describe all possible side effects. Call your doctor for medical advice about side effects. You may report side effects to FDA at1-800-FDA-1088. Where should I keep my medication? Keep out of the reach of children. This drug is usually given in a hospital or clinic and will not be stored athome. In rare cases, some brands of this medicine may be given at home. If you are using this medicine at home, you will be instructed on how to store thismedicine. Throw away any unused medicine after the expiration date on the label. NOTE: This sheet is a summary. It may not cover all possible information. If you have questions about this medicine, talk to your doctor, pharmacist, orhealth care provider.  2022 Elsevier/Gold Standard (2018-10-21 12:51:14)  

## 2020-10-23 NOTE — Progress Notes (Signed)
[facility]  [address] [place],  [place]  [address] [phone]  Clinic Day:  10/30/2020  Referring physician: [name], MD  This document serves as a record of services personally performed by Hosie Poisson, MD. It was created on their behalf by [facility], a trained medical scribe. The creation of this record is based on the scribe's personal observations and the provider's statements to them.  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  She was on observation until November 2008. She was initially treated with chlorambucil and prednisone, as she refused intravenous chemotherapy.  She had a partial response to this regimen.  By January 2010, she had progressive disease with a white count over 200,000, with associated anemia, splenomegaly, thrombocytopenia, and adenopathy.  She then received 5 cycles of fludarabine with good response.  She did well until January 2012, when she had progression once again.  She had a single dose of bendamustine and rituximab, which kept her disease under control for over a year.  However, she had a severe allergic reaction to the rituximab, so has not received further rituximab.  She was hospitalized after the bendamustine because of severe pancytopenia and required multiple transfusions, so was placed on observation.  In March 2013, she had progression of disease again, so was treated with bendamustine for 6 cycles, at a 50% dose reduction, and once again had a excellent response.  She was on observation until August 2014, then had progression of disease, so was placed on ibrutinib 420 mg daily.  The ibrutinib had kept her disease under fairly good control, but then  she develops severe bilateral lower extremity cellulitis in April 2016, so ibrutinib was placed on hold.  She had persistent cellulitis, as well as  Clostridium difficile colitis in May 2016 requiring hospitalization.  She was readmitted soon after discharge with worsening cellulitis, requiring a prolonged hospitalization.  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 She eventually had resolution of the cellulitis   As she was largely asymptomatic, we continued observation until February 2017, at which time she had worsening splenomegaly.  Repeat CT imaging in February showed progressive disease, with marked splenomegaly and bulky lymphadenopathy.  She was then placed back on ibrutinib 480m daily.  The lymphocytosis, anemia and thrombocytopenia slowly improved.  Due to neutropenia, ibrutinib was placed on hold in December 2017.  The ibrutinib was resumed in January 2018, but discontinued in August 2018 due to toxicities, mainly severe abdominal muscle cramping.  She was hospitalized in early September 2018 with urinary tract infection and pancytopenia.  She received multiple red blood cell and platelet transfusions during her stay.  She was admitted again in late September 2018 with perineal cellulitis extending to the lower abdomen and upper thigh, as well as persistent pancytopenia.  CT abdomen and pelvis revealed interval decrease in the abdominal lymphadenopathy, chronic massive splenomegaly with scattered small splenic infarcts, and small left pleural effusion with bibasilar atelectasis.  She required packed red blood cells while hospitalized.  She was discharged home with home health and followed up with the [facility]  We continued to follow her closely, but did not place her on  a treatment due to the persistent cellulitis.  She then was admitted in October 2018 with severe hypercalcemia, with a calcium of 15.  She was  treated with IV fluids, zoledronic acid and Calcitonin injections.  We continued to monitor her closely.  She had recurrent hypercalcemia, for which she received IV fluids and zolendronic acid as an outpatient.     Due to worsening pancytopenia, she underwent bone marrow biopsy in January 2019.   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  The cellulitis had improved to a point we felt she could be treated again, so she was placed on venetoclax CLL in January 2019.  She has continued on venetoclax 400 mg daily after ramp up dosing and has tolerated that fairly well, except for mild diarrhea.  Since starting venetoclax, she has had decreasing splenomegaly and improvement in her pancytopenia.  She initially continued to require IV fluids and zoledronic acid for the hypercalcemia.  Her last dose of zoledronic acid was given in March 2019.  In March, she had an irregular heart rhythm and EKG revealed occasional premature atrial complexes, as well as an incomplete right bundle branch block.  This was stable compared to EKG done in October 2018.  Quantitative immunoglobulins done in August 2019 revealed a normal IgG, with low IgA and IgM.  She has continued IVIG monthly.  She had an episode of Campylobacter diarrhea in November 2019, which was treated with azithromycin for 7 days with resolution of her diarrhea.  She was evaluated by GI who recommended colonoscopy in May of next year.  Venetoclax was held temporarily until her diarrhea resolved.  She has since tolerated venetoclax without significant difficulty.     She completed a full 2 years of monotherapy with venetoclax in January 2021.  We recommended continuing venetoclax 400 mg daily until progression of disease or unacceptable toxicities.  Annual bilateral mammogram in Apri 2021 revealed a possible mass in the left breast, which warranted further evaluation. Right breast did not reveal  any evidence of malignancy.  Unilateral diagnostic left mammogram in May, revealed benign fibrocystic change within the outer left breast with no evidence of malignancy.  Routine annual bilateral mammography was recommended.  In February 2022, she had a right diagnostic mammogram due to right breast subareolar tenderness, which did not reveal any evidence of malignancy.  CT abdomen in April did not reveal any acute abnormality or other findings to explain the left-sided abdominal pain.  There was resolution of previously seen gross splenomegaly.  There were prominent retroperitoneal lymph nodes which had significantly diminished in size compared to prior exam.  A hiatal hernia was seen. There was descending colonic diverticulosis without evidence of acute diverticulitis.  Bilateral screening mammogram in May revealed possible asymmetry in the left breast.  Left diagnostic mammogram and possible ultrasound from June 29th showed a cluster of cysts/apocrine cyst in the left breast at 2 o'clock, 3 cm the nipple, measuring 6 x 3 x 6 mm, unchanged from the exam dated 08/09/2019. There are no solid masses or suspicious lesions..   INTERVAL HISTORY:  Frayda is here for follow up prior to her next IVIG.  She continues venetoclax 454m daily without significant difficulty.  She is up to date on colonoscopy with Dr. MLyda Jesteras of June which was clear, and repeat in 10 years was advised.  Her white count is stable at 3.0 with an ANC of 1380, her hemoglobin is stable to mildly decreased at 11.6,  and her platelet count is stable at 65,000.  Chemistries are unremarkable.  Her  appetite is good, and she has gained 2 and 1/2 pounds since her last visit.  She denies fever, chills or other signs of infection.  She denies nausea, vomiting, bowel issues, or abdominal pain.  She denies sore throat, cough, dyspnea, or chest pain.  REVIEW OF SYSTEMS:  Review of Systems  Constitutional:  Negative for appetite change, chills,  fatigue, fever and unexpected weight change.  HENT:   Negative for lump/mass, mouth sores and sore throat.   Respiratory:  Negative for cough and shortness of breath.   Cardiovascular:  Negative for chest pain and leg swelling.  Gastrointestinal:  Negative for abdominal pain, constipation, diarrhea, nausea and vomiting.  Genitourinary:  Negative for difficulty urinating, dysuria, frequency and hematuria.   Musculoskeletal:  Positive for arthralgias (bilateral knee pain). Negative for back pain and myalgias.  Skin:  Negative for rash.  Neurological:  Negative for dizziness and headaches.  Hematological:  Negative for adenopathy. Does not bruise/bleed easily.  Psychiatric/Behavioral:  Negative for depression and sleep disturbance. The patient is not nervous/anxious.   VITALS:  Blood pressure (!) 146/71, pulse 72, temperature 98.7 F (37.1 C), temperature source Oral, resp. rate 18, height 5' (1.524 m), weight 221 lb 8 oz (100.5 kg), SpO2 98 %.  Wt Readings from Last 3 Encounters:  10/30/20 221 lb 8 oz (100.5 kg)  09/27/20 218 lb 4 oz (99 kg)  09/26/20 219 lb 11.2 oz (99.7 kg)    Body mass index is 43.26 kg/m.  Performance status (ECOG): 1 - Symptomatic but completely ambulatory  PHYSICAL EXAM:  Physical Exam Constitutional:      General: She is not in acute distress.    Appearance: Normal appearance. She is normal weight.  HENT:     Head: Normocephalic and atraumatic.  Eyes:     General: No scleral icterus.    Extraocular Movements: Extraocular movements intact.     Conjunctiva/sclera: Conjunctivae normal.     Pupils: Pupils are equal, round, and reactive to light.  Cardiovascular:     Rate and Rhythm: Normal rate and regular rhythm.     Pulses: Normal pulses.     Heart sounds: Normal heart sounds. No murmur heard.   No friction rub. No gallop.  Pulmonary:     Effort: Pulmonary effort is normal. No respiratory distress.     Breath sounds: Normal breath sounds.  Abdominal:      General: Bowel sounds are normal. There is no distension.     Palpations: Abdomen is soft. There is no hepatomegaly, splenomegaly or mass.     Tenderness: There is no abdominal tenderness.  Musculoskeletal:        General: Normal range of motion.     Cervical back: Normal range of motion and neck supple.     Right lower leg: Edema (mild) present.     Left lower leg: Edema (mild) present.  Lymphadenopathy:     Cervical: No cervical adenopathy.  Skin:    General: Skin is warm and dry.  Neurological:     General: No focal deficit present.     Mental Status: She is alert and oriented to person, place, and time. Mental status is at baseline.  Psychiatric:        Mood and Affect: Mood normal.        Behavior: Behavior normal.        Thought Content: Thought content normal.  Judgment: Judgment normal.   LABS:   CBC Latest Ref Rng & Units 10/30/2020 09/26/2020 08/29/2020  WBC - 3.0 3.0 3.4  Hemoglobin 12.0 - 16.0 11.6(A) 11.9(A) 11.7(A)  Hematocrit 36 - 46 35(A) 36 35(A)  Platelets 150 - 399 65(A) 68(A) 62(A)   CMP Latest Ref Rng & Units 10/30/2020 09/26/2020 08/29/2020  BUN 4 - 21 19 22(A) 21  Creatinine 0.5 - 1.1 0.9 0.8 0.8  Sodium 137 - 147 140 140 139  Potassium 3.4 - 5.3 4.2 3.9 4.1  Chloride 99 - 108 107 105 106  CO2 13 - 22 25(A) 27(A) 27(A)  Calcium 8.7 - 10.7 8.9 8.7 9.0  Alkaline Phos 25 - 125 74 79 76  AST 13 - 35 25 27 27   ALT 7 - 35 23 21 24     STUDIES:  No results found.   EXAM: 09/27/2020 DIGITAL DIAGNOSTIC UNILATERAL LEFT MAMMOGRAM WITH TOMOSYNTHESIS AND CAD; ULTRASOUND LEFT BREAST  TECHNIQUE: Left digital diagnostic mammography and breast tomosynthesis was performed. The images were evaluated with computer-aided detection.; Targeted ultrasound examination of the left breast was performed.  COMPARISON:  Previous exam(s).  ACR Breast Density Category b: There are scattered areas of fibroglandular density.  FINDINGS: Possible asymmetry, noted in the  lateral aspect left breast incompletely disperses on spot compression imaging. The appearance similar to the diagnostic exam dated 08/09/2019. There is an apparent lobulated mass comprising a complete prominent of the asymmetry in the lateral left breast, also similar to the prior exam. There is no other evidence of a mass, there are no areas of architectural distortion and there are no suspicious calcifications.  Targeted left breast ultrasound is performed, showing a cluster of cysts/apocrine cyst in the left breast at 2 o'clock, 3 cm the nipple, measuring 6 x 3 x 6 mm, unchanged from the exam dated 08/09/2019. There are no solid masses or suspicious lesions.  IMPRESSION: 1. No evidence of breast malignancy. 2. Benign cluster of cysts/apocrine cyst in the left breast at 2 o'clock, stable since May of last year.   HISTORY:   Allergies:  Allergies  Allergen Reactions   Metronidazole    Rituximab    Sulfa Antibiotics     Current Medications: Current Outpatient Medications  Medication Sig Dispense Refill   alendronate (FOSAMAX) 70 MG tablet      Ascorbic Acid (VITAMIN C WITH ROSE HIPS) 500 MG tablet Take 500 mg by mouth daily.     benzonatate (TESSALON) 200 MG capsule TAKE ONE CAPSULE 3 TIMES A DAY AS NEEDED FOR COUGH  5   cyclobenzaprine (FLEXERIL) 5 MG tablet      diclofenac Sodium (VOLTAREN) 1 % GEL Apply topically.     hydrochlorothiazide (MICROZIDE) 12.5 MG capsule Take by mouth.     KLOR-CON M20 20 MEQ tablet      lidocaine (LIDODERM) 5 % 2 patches daily.     lidocaine-prilocaine (EMLA) cream Apply 1 application topically as needed. 30 g 5   NEOMYCIN-POLYMYXIN-HYDROCORTISONE (CORTISPORIN) 1 % SOLN otic solution      omeprazole (PRILOSEC) 20 MG capsule Take 1 capsule (20 mg total) by mouth daily. 90 capsule 3   oxyCODONE (ROXICODONE) 15 MG immediate release tablet Take 15 mg by mouth every 6 (six) hours as needed.     prochlorperazine (COMPAZINE) 10 MG tablet       VENCLEXTA 100 MG tablet Take 400 mg by mouth daily.     Vibegron (GEMTESA) 75 MG TABS Take 75 mg by mouth at bedtime.  Current Facility-Administered Medications  Medication Dose Route Frequency Provider Last Rate Last Admin   sodium chloride flush (NS) 0.9 % injection 10 mL  10 mL Intracatheter PRN Mosher, Vida Roller A, PA-C   10 mL at 10/30/20 0848   Facility-Administered Medications Ordered in Other Visits  Medication Dose Route Frequency Provider Last Rate Last Admin   sodium chloride flush (NS) 0.9 % injection 10 mL  10 mL Intracatheter PRN Mosher, Kelli A, PA-C   10 mL at 09/26/20 0839     ASSESSMENT & PLAN:   Assessment:  1. CLL, well controlled with venetoclax 440m daily, which she will continue. Her blood counts remain stable.   2. 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  She asks whether she will need to stay on this monthly and so we will reevaluate.  She certainly has been able to avoid infections.  We will plan to skip the IVIG in August, and will recheck immunoglobulin levels in September and if they remain in a good range, we will consider IVIG every 8 weeks.  However, we discussed the fact that it may be wise to continue every 4 weeks through the winter months, and she agrees.   3. 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   4. Possible asymmetry in the left breast on screening mammogram. Repeat diagnostic unilateral mammogram and ultrasound from June revealed a benign cluster of cysts.  There was no evidence of malignancy.   Plan:   She will proceed with IVIG tomorrow.  She knows to continue venetoclax daily. We will skip her appointment and IVIG in 4 weeks.  I will plan to see her back in 8 weeks with a CBC, comprehensive metabolic panel and quantitative immunoglobulins prior to her next IVIG. If her immunoglobulin levels are in  a good range, we will consider IVIG every 8 weeks, but advise to continue every 4 weeks through the winter months.  The patient understands the plans discussed today and is in agreement with them.  She knows to contact our office if she develops concerns prior to her next appointment.   I provided 15 minutes of face-to-face time during this this encounter and > 50% was spent counseling as documented under my assessment and plan.     I, LRita Ohara am acting as scribe for CDerwood Kaplan MD  I have reviewed this report as typed by the medical scribe, and it is complete and accurate.

## 2020-10-30 ENCOUNTER — Encounter: Payer: Self-pay | Admitting: Oncology

## 2020-10-30 ENCOUNTER — Inpatient Hospital Stay: Payer: Medicare Other

## 2020-10-30 ENCOUNTER — Inpatient Hospital Stay: Payer: Medicare Other | Attending: Hematology and Oncology | Admitting: Oncology

## 2020-10-30 ENCOUNTER — Other Ambulatory Visit: Payer: Self-pay

## 2020-10-30 ENCOUNTER — Telehealth: Payer: Self-pay | Admitting: Oncology

## 2020-10-30 ENCOUNTER — Other Ambulatory Visit: Payer: Self-pay | Admitting: Oncology

## 2020-10-30 VITALS — BP 146/71 | HR 72 | Temp 98.7°F | Resp 18 | Ht 60.0 in | Wt 221.5 lb

## 2020-10-30 DIAGNOSIS — D819 Combined immunodeficiency, unspecified: Secondary | ICD-10-CM

## 2020-10-30 DIAGNOSIS — C9110 Chronic lymphocytic leukemia of B-cell type not having achieved remission: Secondary | ICD-10-CM

## 2020-10-30 DIAGNOSIS — Z79899 Other long term (current) drug therapy: Secondary | ICD-10-CM | POA: Insufficient documentation

## 2020-10-30 DIAGNOSIS — D801 Nonfamilial hypogammaglobulinemia: Secondary | ICD-10-CM

## 2020-10-30 LAB — CBC AND DIFFERENTIAL
HCT: 35 — AB (ref 36–46)
Hemoglobin: 11.6 — AB (ref 12.0–16.0)
Neutrophils Absolute: 1.38
Platelets: 65 — AB (ref 150–399)
WBC: 3

## 2020-10-30 LAB — BASIC METABOLIC PANEL
BUN: 19 (ref 4–21)
CO2: 25 — AB (ref 13–22)
Chloride: 107 (ref 99–108)
Creatinine: 0.9 (ref 0.5–1.1)
Glucose: 90
Potassium: 4.2 (ref 3.4–5.3)
Sodium: 140 (ref 137–147)

## 2020-10-30 LAB — HEPATIC FUNCTION PANEL
ALT: 23 (ref 7–35)
AST: 25 (ref 13–35)
Alkaline Phosphatase: 74 (ref 25–125)
Bilirubin, Total: 0.4

## 2020-10-30 LAB — CBC: RBC: 3.91 (ref 3.87–5.11)

## 2020-10-30 LAB — COMPREHENSIVE METABOLIC PANEL
Albumin: 4.1 (ref 3.5–5.0)
Calcium: 8.9 (ref 8.7–10.7)

## 2020-10-30 MED ORDER — HEPARIN SOD (PORK) LOCK FLUSH 100 UNIT/ML IV SOLN
500.0000 [IU] | Freq: Once | INTRAVENOUS | Status: AC | PRN
  Administered 2020-10-30: 500 [IU]
  Filled 2020-10-30: qty 5

## 2020-10-30 MED ORDER — SODIUM CHLORIDE 0.9% FLUSH
10.0000 mL | INTRAVENOUS | Status: DC | PRN
  Administered 2020-10-30: 10 mL
  Filled 2020-10-30: qty 10

## 2020-10-30 NOTE — Telephone Encounter (Signed)
Per 8/1 los next appt scheduled and given to patient

## 2020-10-30 NOTE — Addendum Note (Signed)
Addended by: Juanetta Beets on: 10/30/2020 10:50 AM   Modules accepted: Orders

## 2020-10-31 ENCOUNTER — Encounter: Payer: Self-pay | Admitting: Oncology

## 2020-10-31 ENCOUNTER — Inpatient Hospital Stay: Payer: Medicare Other

## 2020-10-31 VITALS — BP 124/64 | HR 64 | Temp 97.5°F | Resp 18 | Ht 60.0 in | Wt 217.5 lb

## 2020-10-31 DIAGNOSIS — D801 Nonfamilial hypogammaglobulinemia: Secondary | ICD-10-CM

## 2020-10-31 DIAGNOSIS — D819 Combined immunodeficiency, unspecified: Secondary | ICD-10-CM | POA: Diagnosis not present

## 2020-10-31 MED ORDER — IMMUNE GLOBULIN (HUMAN) 10 GM/100ML IV SOLN
1.0000 g/kg | Freq: Once | INTRAVENOUS | Status: AC
  Administered 2020-10-31: 45 g via INTRAVENOUS
  Filled 2020-10-31: qty 450

## 2020-10-31 MED ORDER — ACETAMINOPHEN 325 MG PO TABS
ORAL_TABLET | ORAL | Status: AC
  Filled 2020-10-31: qty 2

## 2020-10-31 MED ORDER — SODIUM CHLORIDE 0.9% FLUSH
10.0000 mL | Freq: Once | INTRAVENOUS | Status: AC | PRN
  Administered 2020-10-31: 10 mL
  Filled 2020-10-31: qty 10

## 2020-10-31 MED ORDER — DIPHENHYDRAMINE HCL 50 MG/ML IJ SOLN
25.0000 mg | Freq: Once | INTRAMUSCULAR | Status: AC
  Administered 2020-10-31: 25 mg via INTRAVENOUS

## 2020-10-31 MED ORDER — DIPHENHYDRAMINE HCL 50 MG/ML IJ SOLN
INTRAMUSCULAR | Status: AC
  Filled 2020-10-31: qty 1

## 2020-10-31 MED ORDER — HEPARIN SOD (PORK) LOCK FLUSH 100 UNIT/ML IV SOLN
500.0000 [IU] | Freq: Once | INTRAVENOUS | Status: AC | PRN
  Administered 2020-10-31: 500 [IU]
  Filled 2020-10-31: qty 5

## 2020-10-31 MED ORDER — ACETAMINOPHEN 325 MG PO TABS
650.0000 mg | ORAL_TABLET | Freq: Once | ORAL | Status: AC
  Administered 2020-10-31: 650 mg via ORAL

## 2020-10-31 MED ORDER — DEXTROSE 5 % IV SOLN
Freq: Once | INTRAVENOUS | Status: AC
  Filled 2020-10-31: qty 250

## 2020-10-31 NOTE — Addendum Note (Signed)
Addended by: Juanetta Beets on: 10/31/2020 11:07 AM   Modules accepted: Orders

## 2020-10-31 NOTE — Patient Instructions (Signed)
Immune Globulin Injection What is this medication? IMMUNE GLOBULIN (im MUNE GLOB yoo lin) helps to prevent or reduce the severity of certain infections in patients who are at risk. This medicine is collected from the pooled blood of many donors. It is used to treat immune systemproblems, thrombocytopenia, and Kawasaki syndrome. This medicine may be used for other purposes; ask your health care provider orpharmacist if you have questions. COMMON BRAND NAME(S): ASCENIV, Baygam, BIVIGAM, Carimune, Carimune NF, cutaquig, Cuvitru, Flebogamma, Flebogamma DIF, GamaSTAN, GamaSTAN S/D, Gamimune N, Gammagard, Gammagard S/D, Gammaked, Gammaplex, Gammar-P IV, Gamunex, Gamunex-C, Hizentra, Iveegam, Iveegam EN, Octagam, Panglobulin, Panglobulin NF, panzyga, Polygam S/D, Privigen, Sandoglobulin, Venoglobulin-S, Vigam,Vivaglobulin, Xembify What should I tell my care team before I take this medication? They need to know if you have any of these conditions: diabetes extremely low or no immune antibodies in the blood heart disease history of blood clots hyperprolinemia infection in the blood, sepsis kidney disease recently received or scheduled to receive a vaccination an unusual or allergic reaction to human immune globulin, albumin, maltose, sucrose, other medicines, foods, dyes, or preservatives pregnant or trying to get pregnant breast-feeding How should I use this medication? This medicine is for injection into a muscle or infusion into a vein or skin. It is usually given by a health care professional in a hospital or clinicsetting. In rare cases, some brands of this medicine might be given at home. You will be taught how to give this medicine. Use exactly as directed. Take your medicineat regular intervals. Do not take your medicine more often than directed. Talk to your pediatrician regarding the use of this medicine in children. Whilethis drug may be prescribed for selected conditions, precautions do  apply. Overdosage: If you think you have taken too much of this medicine contact apoison control center or emergency room at once. NOTE: This medicine is only for you. Do not share this medicine with others. What if I miss a dose? It is important not to miss your dose. Call your doctor or health care professional if you are unable to keep an appointment. If you give yourself the medicine and you miss a dose, take it as soon as you can. If it is almost timefor your next dose, take only that dose. Do not take double or extra doses. What may interact with this medication? aspirin and aspirin-like medicines cisplatin cyclosporine medicines for infection like acyclovir, adefovir, amphotericin B, bacitracin, cidofovir, foscarnet, ganciclovir, gentamicin, pentamidine, vancomycin NSAIDS, medicines for pain and inflammation, like ibuprofen or naproxen pamidronate vaccines zoledronic acid This list may not describe all possible interactions. Give your health care provider a list of all the medicines, herbs, non-prescription drugs, or dietary supplements you use. Also tell them if you smoke, drink alcohol, or use illegaldrugs. Some items may interact with your medicine. What should I watch for while using this medication? Your condition will be monitored carefully while you are receiving thismedicine. This medicine is made from pooled blood donations of many different people. It may be possible to pass an infection in this medicine. However, the donors are screened for infections and all products are tested for HIV and hepatitis. The medicine is treated to kill most or all bacteria and viruses. Talk to yourdoctor about the risks and benefits of this medicine. Do not have vaccinations for at least 14 days before, or until at least 3months after receiving this medicine. What side effects may I notice from receiving this medication? Side effects that you should report to your doctor   or health care  professionalas soon as possible: allergic reactions like skin rash, itching or hives, swelling of the face, lips, or tongue blue colored lips or skin breathing problems chest pain or tightness fever signs and symptoms of aseptic meningitis such as stiff neck; sensitivity to light; headache; drowsiness; fever; nausea; vomiting; rash signs and symptoms of a blood clot such as chest pain; shortness of breath; pain, swelling, or warmth in the leg signs and symptoms of hemolytic anemia such as fast heartbeat; tiredness; dark yellow or brown urine; or yellowing of the eyes or skin signs and symptoms of kidney injury like trouble passing urine or change in the amount of urine sudden weight gain swelling of the ankles, feet, hands Side effects that usually do not require medical attention (report to yourdoctor or health care professional if they continue or are bothersome): diarrhea flushing headache increased sweating joint pain muscle cramps muscle pain nausea pain, redness, or irritation at site where injected tiredness This list may not describe all possible side effects. Call your doctor for medical advice about side effects. You may report side effects to FDA at1-800-FDA-1088. Where should I keep my medication? Keep out of the reach of children. This drug is usually given in a hospital or clinic and will not be stored athome. In rare cases, some brands of this medicine may be given at home. If you are using this medicine at home, you will be instructed on how to store thismedicine. Throw away any unused medicine after the expiration date on the label. NOTE: This sheet is a summary. It may not cover all possible information. If you have questions about this medicine, talk to your doctor, pharmacist, orhealth care provider.  2022 Elsevier/Gold Standard (2018-10-21 12:51:14)  

## 2020-11-02 ENCOUNTER — Other Ambulatory Visit: Payer: Self-pay | Admitting: Hematology and Oncology

## 2020-11-02 MED ORDER — PROCHLORPERAZINE MALEATE 10 MG PO TABS: 10.0000 mg | ORAL_TABLET | Freq: Four times a day (QID) | ORAL | 1 refills | Status: DC | PRN

## 2020-11-08 ENCOUNTER — Other Ambulatory Visit: Payer: Self-pay | Admitting: Oncology

## 2020-11-14 ENCOUNTER — Encounter: Payer: Self-pay | Admitting: Hematology and Oncology

## 2020-12-18 NOTE — Progress Notes (Signed)
[facility]  [address] [place],  [place]  [address] [phone]  Clinic Day:  12/25/2020  Referring physician: [name], MD  This document serves as a record of services personally performed by Hosie Poisson, MD. It was created on their behalf by [facility], a trained medical scribe. The creation of this record is based on the scribe's personal observations and the provider's statements to them.  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  She was on observation until November 2008. She was initially treated with chlorambucil and prednisone, as she refused intravenous chemotherapy.  She had a partial response to this regimen.  By January 2010, she had progressive disease with a white count over 200,000, with associated anemia, splenomegaly, thrombocytopenia, and adenopathy.  She then received 5 cycles of fludarabine with good response.  She did well until January 2012, when she had progression once again.  She had a single dose of bendamustine and rituximab, which kept her disease under control for over a year.  However, she had a severe allergic reaction to the rituximab, so has not received further rituximab.  She was hospitalized after the bendamustine because of severe pancytopenia and required multiple transfusions, so was placed on observation.  In March 2013, she had progression of disease again, so was treated with bendamustine for 6 cycles, at a 50% dose reduction, and once again had a excellent response.  She was on observation until August 2014, then had progression of disease, so was placed on ibrutinib 420 mg daily.  The ibrutinib had kept her disease under fairly good control, but  then she develops severe bilateral lower extremity cellulitis in April 2016, so ibrutinib was placed on hold.  She had persistent cellulitis, as well as  Clostridium difficile colitis in May 2016 requiring hospitalization.  She was readmitted soon after discharge with worsening cellulitis, requiring a prolonged hospitalization.  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 She eventually had resolution of the cellulitis   As she was largely asymptomatic, we continued observation until February 2017, at which time she had worsening splenomegaly.  Repeat CT imaging in February showed progressive disease, with marked splenomegaly and bulky lymphadenopathy.  She was then placed back on ibrutinib 425m daily.  The lymphocytosis, anemia and thrombocytopenia slowly improved.  Due to neutropenia, ibrutinib was placed on hold in December 2017.  The ibrutinib was resumed in January 2018, but discontinued in August 2018 due to toxicities, mainly severe abdominal muscle cramping.  She was hospitalized in early September 2018 with urinary tract infection and pancytopenia.  She received multiple red blood cell and platelet transfusions during her stay.  She was admitted again in late September 2018 with perineal cellulitis extending to the lower abdomen and upper thigh, as well as persistent pancytopenia.  CT abdomen and pelvis revealed interval decrease in the abdominal lymphadenopathy, chronic massive splenomegaly with scattered small splenic infarcts, and small left pleural effusion with bibasilar atelectasis.  She required packed red blood cells while hospitalized.  She was discharged home with home health and [place] [facility]  We continued to follow her closely, but did not place her on  a treatment due to the persistent cellulitis.  She then was admitted in October 2018 with severe hypercalcemia, with a calcium of 15.  She  was treated with IV fluids, zoledronic acid and Calcitonin injections.  We continued to monitor her closely.  She had recurrent hypercalcemia, for which she received IV fluids and zolendronic acid as an outpatient.     Due to worsening pancytopenia, she underwent bone marrow biopsy in January 2019.   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  The cellulitis had improved to a point we felt she could be treated again, so she was placed on venetoclax CLL in January 2019.  She has continued on venetoclax 400 mg daily after ramp up dosing and has tolerated that fairly well, except for mild diarrhea.  Since starting venetoclax, she has had decreasing splenomegaly and improvement in her pancytopenia.  She initially continued to require IV fluids and zoledronic acid for the hypercalcemia.  Her last dose of zoledronic acid was given in March 2019.  In March, she had an irregular heart rhythm and EKG revealed occasional premature atrial complexes, as well as an incomplete right bundle branch block.  This was stable compared to EKG done in October 2018.  Quantitative immunoglobulins done in August 2019 revealed a normal IgG, with low IgA and IgM.  She has continued IVIG monthly.  She had an episode of Campylobacter diarrhea in November 2019, which was treated with azithromycin for 7 days with resolution of her diarrhea.  She was evaluated by GI who recommended colonoscopy in May of next year.  Venetoclax was held temporarily until her diarrhea resolved.  She has since tolerated venetoclax without significant difficulty.     She completed a full 2 years of monotherapy with venetoclax in January 2021.  We recommended continuing venetoclax 400 mg daily until progression of disease or unacceptable toxicities.  Annual bilateral mammogram in Apri 2021 revealed a possible mass in the left breast, which warranted further evaluation. Right breast did not  reveal any evidence of malignancy.  Unilateral diagnostic left mammogram in May, revealed benign fibrocystic change within the outer left breast with no evidence of malignancy.  Routine annual bilateral mammography was recommended.  In February 2022, she had a right diagnostic mammogram due to right breast subareolar tenderness, which did not reveal any evidence of malignancy.  CT abdomen in April did not reveal any acute abnormality or other findings to explain the left-sided abdominal pain.  There was resolution of previously seen gross splenomegaly.  There were prominent retroperitoneal lymph nodes which had significantly diminished in size compared to prior exam.  A hiatal hernia was seen. There was descending colonic diverticulosis without evidence of acute diverticulitis.  Bilateral screening mammogram in May 2022 revealed possible asymmetry in the left breast.  Left diagnostic mammogram and possible ultrasound from June 29th showed a cluster of cysts/apocrine cyst in the left breast at 2 o'clock, 3 cm the nipple, measuring 6 x 3 x 6 mm, unchanged from the exam dated 08/09/2019. There are no solid masses or suspicious lesions. She is up to date on colonoscopy with Dr. Lyda Jester as of June 2022 which was clear, and repeat in 10 years was advised.     INTERVAL HISTORY:  Alyze is here for routine follow up prior to her next IVIG. She continues venetoclax 400mg  daily without significant difficulty. She states that she is doing well and denies complaints. White count is stable to improved at 3.2 with  an [place] of 1570, platelets are stable at 63,000, and hemoglobin is normal at 12.1. Chemistries are unremarkable. Her  appetite is good, and she has gained 1 pound since her last visit.  She denies fever, chills or other signs of infection.  She denies nausea, vomiting, bowel issues, or abdominal pain.  She denies sore throat, cough, dyspnea, or chest pain.  REVIEW OF SYSTEMS:  Review of Systems  Constitutional:  Negative.  Negative for appetite change, chills, fatigue, fever and unexpected weight change.  HENT:  Negative.    Eyes: Negative.   Respiratory: Negative.  Negative for chest tightness, cough, hemoptysis, shortness of breath and wheezing.   Cardiovascular: Negative.  Negative for chest pain, leg swelling and palpitations.  Gastrointestinal: Negative.  Negative for abdominal distention, abdominal pain, blood in stool, constipation, diarrhea, nausea and vomiting.  Endocrine: Negative.   Genitourinary: Negative.  Negative for difficulty urinating, dysuria, frequency and hematuria.   Musculoskeletal: Negative.  Negative for arthralgias, back pain, flank pain, gait problem and myalgias.  Skin: Negative.   Neurological: Negative.  Negative for dizziness, extremity weakness, gait problem, headaches, light-headedness, numbness, seizures and speech difficulty.  Hematological: Negative.   Psychiatric/Behavioral: Negative.  Negative for depression and sleep disturbance. The patient is not nervous/anxious.    VITALS:  Blood pressure (!) 175/73, pulse 71, temperature 98.1 F (36.7 C), temperature source Oral, resp. rate 18, height 5' (1.524 m), weight 222 lb (100.7 kg), SpO2 98 %.  Wt Readings from Last 3 Encounters:  12/25/20 222 lb (100.7 kg)  10/31/20 217 lb 8 oz (98.7 kg)  10/30/20 221 lb 8 oz (100.5 kg)    Body mass index is 43.36 kg/m.  Performance status (ECOG): 0 - Asymptomatic  PHYSICAL EXAM:  Physical Exam Constitutional:      General: She is not in acute distress.    Appearance: Normal appearance. She is normal weight.  HENT:     Head: Normocephalic and atraumatic.  Eyes:     General: No scleral icterus.    Extraocular Movements: Extraocular movements intact.     Conjunctiva/sclera: Conjunctivae normal.     Pupils: Pupils are equal, round, and reactive to light.  Cardiovascular:     Rate and Rhythm: Normal rate and regular rhythm.     Pulses: Normal pulses.     Heart sounds:  Normal heart sounds. No murmur heard.   No friction rub. No gallop.  Pulmonary:     Effort: Pulmonary effort is normal. No respiratory distress.     Breath sounds: Normal breath sounds.  Abdominal:     General: Bowel sounds are normal. There is no distension.     Palpations: Abdomen is soft. There is splenomegaly (borderline, just at the left costal margin). There is no hepatomegaly or mass.     Tenderness: There is no abdominal tenderness.  Musculoskeletal:        General: Normal range of motion.     Cervical back: Normal range of motion and neck supple.     Right lower leg: No edema.     Left lower leg: No edema.  Lymphadenopathy:     Cervical: No cervical adenopathy.  Skin:    General: Skin is warm and dry.  Neurological:     General: No focal deficit present.     Mental Status: She is alert and oriented to person, place, and time. Mental status is at baseline.  Psychiatric:        Mood and Affect: Mood normal.  Behavior: Behavior normal.        Thought Content: Thought content normal.        Judgment: Judgment normal.   LABS:   CBC Latest Ref Rng & Units 12/25/2020 10/30/2020 09/26/2020  WBC - 3.2 3.0 3.0  Hemoglobin 12.0 - 16.0 12.1 11.6(A) 11.9(A)  Hematocrit 36 - 46 37 35(A) 36  Platelets 150 - 399 63(A) 65(A) 68(A)   CMP Latest Ref Rng & Units 12/25/2020 10/30/2020 09/26/2020  BUN 4 - _0 22(A)  Creatinine 0.5 - 1.1 0.9 0.9 0.8  Sodium 137 - 147 142 140 140  Potassium 3.4 - 5.3 3.8 4.2 3.9  Chloride 99 - 108 108 107 105  CO2 13 - 22 25(A) 25(A) 27(A)  Calcium 8.7 - 10.7 8.9 8.9 8.7  Alkaline Phos 25 - 125 73 74 79  AST 13 - 35 _1 ALT 7 - 35 _2 STUDIES:  No results found.     HISTORY:   Allergies:  Allergies  Allergen Reactions   Metronidazole    Rituximab    Sulfa Antibiotics     Current Medications: Current Outpatient Medications  Medication Sig Dispense Refill   alendronate (FOSAMAX) 70 MG tablet      Ascorbic Acid  (VITAMIN C WITH ROSE HIPS) 500 MG tablet Take 500 mg by mouth daily.     benzonatate (TESSALON) 100 MG capsule TAKE ONE CAPSULE BY MOUTH 3 TIMES A DAY AS NEEDED 60 capsule 5   cyclobenzaprine (FLEXERIL) 5 MG tablet      diclofenac Sodium (VOLTAREN) 1 % GEL Apply topically.     hydrochlorothiazide (MICROZIDE) 12.5 MG capsule Take by mouth.     KLOR-CON M20 20 MEQ tablet      lidocaine (LIDODERM) 5 % 2 patches daily.     lidocaine-prilocaine (EMLA) cream Apply 1 application topically as needed. 30 g 5   NEOMYCIN-POLYMYXIN-HYDROCORTISONE (CORTISPORIN) 1 % SOLN otic solution      omeprazole (PRILOSEC) 20 MG capsule Take 1 capsule (20 mg total) by mouth daily. 90 capsule 3   oxyCODONE (ROXICODONE) 15 MG immediate release tablet Take 15 mg by mouth every 6 (six) hours as needed.     prochlorperazine (COMPAZINE) 10 MG tablet Take 1 tablet (10 mg total) by mouth every 6 (six) hours as needed for nausea or vomiting. 90 tablet 1   VENCLEXTA 100 MG tablet Take 400 mg by mouth daily.     Vibegron (GEMTESA) 75 MG TABS Take 75 mg by mouth at bedtime.      Current Facility-Administered Medications  Medication Dose Route Frequency Provider Last Rate Last Admin   sodium chloride flush (NS) 0.9 % injection 10 mL  10 mL Intracatheter PRN Mosher, Kelli A, PA-C   10 mL at 12/25/20 0858   Facility-Administered Medications Ordered in Other Visits  Medication Dose Route Frequency Provider Last Rate Last Admin   sodium chloride flush (NS) 0.9 % injection 10 mL  10 mL Intracatheter PRN Mosher, Kelli A, PA-C   10 mL at 09/26/20 0839     ASSESSMENT & PLAN:   Assessment:  1. CLL, well controlled with venetoclax 488m daily, which she will continue. Her blood counts remain stable.   2. 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  She asks whether she will need to stay on this monthly and so we will reevaluate.  She certainly has been able to avoid infections.  IVIG every 8  weeks  could be considered. However, we have discussed the fact that it may be wise to continue every 4 weeks through the winter months, and she agrees.   3. 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   4. Possible asymmetry in the left breast on screening mammogram. Repeat diagnostic unilateral mammogram and ultrasound from June revealed a benign cluster of cysts.  There was no evidence of malignancy.   Plan:   She will proceed with IVIG tomorrow.  She knows to continue venetoclax daily. We will plan to see her back in 4 weeks with a CBC, comprehensive metabolic panel and quantitative immunoglobulins prior to her next IVIG. The patient understands the plans discussed today and is in agreement with them.  She knows to contact our office if she develops concerns prior to her next appointment.   I provided 15 minutes of face-to-face time during this this encounter and > 50% was spent counseling as documented under my assessment and plan.     I, Rita Ohara, am acting as scribe for Derwood Kaplan, MD  I have reviewed this report as typed by the medical scribe, and it is complete and accurate.

## 2020-12-25 ENCOUNTER — Other Ambulatory Visit: Payer: Self-pay | Admitting: Oncology

## 2020-12-25 ENCOUNTER — Other Ambulatory Visit: Payer: Self-pay | Admitting: Hematology and Oncology

## 2020-12-25 ENCOUNTER — Encounter: Payer: Self-pay | Admitting: Oncology

## 2020-12-25 ENCOUNTER — Inpatient Hospital Stay: Payer: Medicare Other | Attending: Hematology and Oncology | Admitting: Oncology

## 2020-12-25 ENCOUNTER — Inpatient Hospital Stay: Payer: Medicare Other

## 2020-12-25 VITALS — BP 175/73 | HR 71 | Temp 98.1°F | Resp 18 | Ht 60.0 in | Wt 222.0 lb

## 2020-12-25 DIAGNOSIS — C9110 Chronic lymphocytic leukemia of B-cell type not having achieved remission: Secondary | ICD-10-CM | POA: Diagnosis not present

## 2020-12-25 DIAGNOSIS — D819 Combined immunodeficiency, unspecified: Secondary | ICD-10-CM | POA: Diagnosis not present

## 2020-12-25 DIAGNOSIS — D801 Nonfamilial hypogammaglobulinemia: Secondary | ICD-10-CM | POA: Diagnosis not present

## 2020-12-25 DIAGNOSIS — Z79899 Other long term (current) drug therapy: Secondary | ICD-10-CM | POA: Diagnosis not present

## 2020-12-25 LAB — COMPREHENSIVE METABOLIC PANEL
Albumin: 4.2 (ref 3.5–5.0)
Calcium: 8.9 (ref 8.7–10.7)

## 2020-12-25 LAB — BASIC METABOLIC PANEL
BUN: 18 (ref 4–21)
CO2: 25 — AB (ref 13–22)
Chloride: 108 (ref 99–108)
Creatinine: 0.9 (ref 0.5–1.1)
Glucose: 114
Potassium: 3.8 (ref 3.4–5.3)
Sodium: 142 (ref 137–147)

## 2020-12-25 LAB — HEPATIC FUNCTION PANEL
ALT: 22 (ref 7–35)
AST: 26 (ref 13–35)
Alkaline Phosphatase: 73 (ref 25–125)
Bilirubin, Total: 0.3

## 2020-12-25 LAB — CBC AND DIFFERENTIAL
HCT: 37 (ref 36–46)
Hemoglobin: 12.1 (ref 12.0–16.0)
Neutrophils Absolute: 1.57
Platelets: 63 — AB (ref 150–399)
WBC: 3.2

## 2020-12-25 LAB — CBC: RBC: 4.09 (ref 3.87–5.11)

## 2020-12-25 MED ORDER — SODIUM CHLORIDE 0.9% FLUSH
10.0000 mL | INTRAVENOUS | Status: DC | PRN
  Administered 2020-12-25: 10 mL

## 2020-12-25 MED ORDER — HEPARIN SOD (PORK) LOCK FLUSH 100 UNIT/ML IV SOLN
500.0000 [IU] | Freq: Once | INTRAVENOUS | Status: AC | PRN
  Administered 2020-12-25: 500 [IU]

## 2020-12-25 MED FILL — Immune Globulin (Human) IV Soln 10 GM/100ML: INTRAVENOUS | Qty: 450 | Status: AC

## 2020-12-26 ENCOUNTER — Inpatient Hospital Stay: Payer: Medicare Other

## 2020-12-26 ENCOUNTER — Other Ambulatory Visit: Payer: Self-pay

## 2020-12-26 VITALS — BP 165/78 | HR 81 | Temp 97.8°F | Resp 18 | Ht 60.0 in | Wt 222.8 lb

## 2020-12-26 DIAGNOSIS — D819 Combined immunodeficiency, unspecified: Secondary | ICD-10-CM | POA: Diagnosis not present

## 2020-12-26 DIAGNOSIS — D801 Nonfamilial hypogammaglobulinemia: Secondary | ICD-10-CM

## 2020-12-26 MED ORDER — DIPHENHYDRAMINE HCL 50 MG/ML IJ SOLN
25.0000 mg | Freq: Once | INTRAMUSCULAR | Status: AC
  Administered 2020-12-26: 25 mg via INTRAVENOUS

## 2020-12-26 MED ORDER — DIPHENHYDRAMINE HCL 50 MG/ML IJ SOLN
25.0000 mg | Freq: Once | INTRAMUSCULAR | Status: AC
  Administered 2020-12-26: 25 mg via INTRAVENOUS
  Filled 2020-12-26: qty 1

## 2020-12-26 MED ORDER — DEXTROSE 5 % IV SOLN
Freq: Once | INTRAVENOUS | Status: AC
Start: 2020-12-26 — End: 2020-12-26

## 2020-12-26 MED ORDER — SODIUM CHLORIDE 0.9% FLUSH
10.0000 mL | Freq: Once | INTRAVENOUS | Status: AC | PRN
  Administered 2020-12-26: 10 mL

## 2020-12-26 MED ORDER — IMMUNE GLOBULIN (HUMAN) 10 GM/100ML IV SOLN
1.0000 g/kg | Freq: Once | INTRAVENOUS | Status: AC
  Administered 2020-12-26: 45 g via INTRAVENOUS
  Filled 2020-12-26: qty 400

## 2020-12-26 MED ORDER — HEPARIN SOD (PORK) LOCK FLUSH 100 UNIT/ML IV SOLN
500.0000 [IU] | Freq: Once | INTRAVENOUS | Status: AC | PRN
  Administered 2020-12-26: 500 [IU]

## 2020-12-26 MED ORDER — DIPHENHYDRAMINE HCL 50 MG/ML IJ SOLN
INTRAMUSCULAR | Status: AC
  Filled 2020-12-26: qty 1

## 2020-12-26 MED ORDER — ACETAMINOPHEN 325 MG PO TABS
650.0000 mg | ORAL_TABLET | Freq: Once | ORAL | Status: AC
  Administered 2020-12-26: 650 mg via ORAL
  Filled 2020-12-26: qty 2

## 2020-12-26 NOTE — Progress Notes (Signed)
1308: PT STABLE AT TIME OF DISCHARGE

## 2020-12-26 NOTE — Patient Instructions (Signed)
Immune Globulin Injection What is this medication? IMMUNE GLOBULIN (im MUNE GLOB yoo lin) helps to prevent or reduce the severity of certain infections in patients who are at risk. This medicine is collected from the pooled blood of many donors. It is used to treat immune systemproblems, thrombocytopenia, and Kawasaki syndrome. This medicine may be used for other purposes; ask your health care provider orpharmacist if you have questions. COMMON BRAND NAME(S): ASCENIV, Baygam, BIVIGAM, Carimune, Carimune NF, cutaquig, Cuvitru, Flebogamma, Flebogamma DIF, GamaSTAN, GamaSTAN S/D, Gamimune N, Gammagard, Gammagard S/D, Gammaked, Gammaplex, Gammar-P IV, Gamunex, Gamunex-C, Hizentra, Iveegam, Iveegam EN, Octagam, Panglobulin, Panglobulin NF, panzyga, Polygam S/D, Privigen, Sandoglobulin, Venoglobulin-S, Vigam,Vivaglobulin, Xembify What should I tell my care team before I take this medication? They need to know if you have any of these conditions: diabetes extremely low or no immune antibodies in the blood heart disease history of blood clots hyperprolinemia infection in the blood, sepsis kidney disease recently received or scheduled to receive a vaccination an unusual or allergic reaction to human immune globulin, albumin, maltose, sucrose, other medicines, foods, dyes, or preservatives pregnant or trying to get pregnant breast-feeding How should I use this medication? This medicine is for injection into a muscle or infusion into a vein or skin. It is usually given by a health care professional in a hospital or clinicsetting. In rare cases, some brands of this medicine might be given at home. You will be taught how to give this medicine. Use exactly as directed. Take your medicineat regular intervals. Do not take your medicine more often than directed. Talk to your pediatrician regarding the use of this medicine in children. Whilethis drug may be prescribed for selected conditions, precautions do  apply. Overdosage: If you think you have taken too much of this medicine contact apoison control center or emergency room at once. NOTE: This medicine is only for you. Do not share this medicine with others. What if I miss a dose? It is important not to miss your dose. Call your doctor or health care professional if you are unable to keep an appointment. If you give yourself the medicine and you miss a dose, take it as soon as you can. If it is almost timefor your next dose, take only that dose. Do not take double or extra doses. What may interact with this medication? aspirin and aspirin-like medicines cisplatin cyclosporine medicines for infection like acyclovir, adefovir, amphotericin B, bacitracin, cidofovir, foscarnet, ganciclovir, gentamicin, pentamidine, vancomycin NSAIDS, medicines for pain and inflammation, like ibuprofen or naproxen pamidronate vaccines zoledronic acid This list may not describe all possible interactions. Give your health care provider a list of all the medicines, herbs, non-prescription drugs, or dietary supplements you use. Also tell them if you smoke, drink alcohol, or use illegaldrugs. Some items may interact with your medicine. What should I watch for while using this medication? Your condition will be monitored carefully while you are receiving thismedicine. This medicine is made from pooled blood donations of many different people. It may be possible to pass an infection in this medicine. However, the donors are screened for infections and all products are tested for HIV and hepatitis. The medicine is treated to kill most or all bacteria and viruses. Talk to yourdoctor about the risks and benefits of this medicine. Do not have vaccinations for at least 14 days before, or until at least 3months after receiving this medicine. What side effects may I notice from receiving this medication? Side effects that you should report to your doctor   or health care  professionalas soon as possible: allergic reactions like skin rash, itching or hives, swelling of the face, lips, or tongue blue colored lips or skin breathing problems chest pain or tightness fever signs and symptoms of aseptic meningitis such as stiff neck; sensitivity to light; headache; drowsiness; fever; nausea; vomiting; rash signs and symptoms of a blood clot such as chest pain; shortness of breath; pain, swelling, or warmth in the leg signs and symptoms of hemolytic anemia such as fast heartbeat; tiredness; dark yellow or brown urine; or yellowing of the eyes or skin signs and symptoms of kidney injury like trouble passing urine or change in the amount of urine sudden weight gain swelling of the ankles, feet, hands Side effects that usually do not require medical attention (report to yourdoctor or health care professional if they continue or are bothersome): diarrhea flushing headache increased sweating joint pain muscle cramps muscle pain nausea pain, redness, or irritation at site where injected tiredness This list may not describe all possible side effects. Call your doctor for medical advice about side effects. You may report side effects to FDA at1-800-FDA-1088. Where should I keep my medication? Keep out of the reach of children. This drug is usually given in a hospital or clinic and will not be stored athome. In rare cases, some brands of this medicine may be given at home. If you are using this medicine at home, you will be instructed on how to store thismedicine. Throw away any unused medicine after the expiration date on the label. NOTE: This sheet is a summary. It may not cover all possible information. If you have questions about this medicine, talk to your doctor, pharmacist, orhealth care provider.  2022 Elsevier/Gold Standard (2018-10-21 12:51:14)  

## 2020-12-28 ENCOUNTER — Encounter: Payer: Self-pay | Admitting: Oncology

## 2021-01-18 ENCOUNTER — Encounter: Payer: Self-pay | Admitting: Oncology

## 2021-01-18 NOTE — Addendum Note (Signed)
Addended by: Juanetta Beets on: 01/18/2021 09:13 PM   Modules accepted: Orders

## 2021-01-18 NOTE — Assessment & Plan Note (Signed)
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  She wanted to space out her treatments, but we recommended she continue IVIG every 4 weeks through the winter months. She will proceed with IVIG tomorrow. We will plan to see her back in 4 weeks with a CBC and comprehensive metabolic panel prior to her next IVIG.

## 2021-01-18 NOTE — Progress Notes (Signed)
[facility]  [address] 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 Her blood counts remain stable. She knows to continue venetoclax daily. We will give her a flu vaccine tomorrow. We will plan to see her back in 4 weeks with a CBC and comprehensive metabolic panel for repeat clinical assessment.  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  She wanted to space out her treatments, but we recommended she continue IVIG every 4 weeks through the winter months. She will proceed with IVIG tomorrow. We will plan to see her back in 4 weeks with a CBC and comprehensive metabolic panel prior to her next IVIG.     The patient understands the plans discussed today and is in agreement with them.  She knows to contact our office if she develops concerns prior to her next appointment.     KMarvia Pickles PA-C  [facility]AT [id number]: [phone] Fax: [phone]  No orders of the defined types were placed in this encounter.     CHIEF COMPLAINT:  CC: Chronic lymphocytic leukemia with associated immunodeficiency  Current Treatment: Venetoclax 400 mg daily with IVIG every 4 weeks   HISTORY OF PRESENT ILLNESS:  Samantha Keevenis a 75y.o. 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  She was on observation until November 2008. She was initially treated with chlorambucil and prednisone, as she refused intravenous chemotherapy.  She had a partial response to this regimen.  By January 2010, she had progressive disease  with a white count over 200,000, with associated anemia, splenomegaly, thrombocytopenia, and adenopathy.  She then received 5 cycles of fludarabine with good response.  She did well until January 2012, when she had progression once again.  She had a single dose of bendamustine and rituximab, which kept her disease under control for over a year.  However, she had a severe allergic reaction to the rituximab, so has not received further rituximab.  She was hospitalized after the bendamustine because of severe pancytopenia and required multiple transfusions, so was placed on observation.  In March 2013, she had progression of disease again, so was treated with bendamustine for 6 cycles, at a 50% dose reduction, and once again had a excellent response.  She was on observation until August 2014, then had progression of disease, so was placed on ibrutinib 420 mg daily.  The ibrutinib had kept her disease under fairly good control, but then she develops severe bilateral lower extremity cellulitis in April 2016, so ibrutinib was placed on hold.  She had persistent cellulitis, as well as  Clostridium difficile colitis in May 2016 requiring hospitalization.  She was readmitted soon after discharge with worsening cellulitis, requiring a prolonged hospitalization.  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 She eventually had resolution of the cellulitis   As she was largely asymptomatic, we continued observation until February 2017, at which time she had worsening splenomegaly.  Repeat CT imaging in February showed progressive disease, with marked splenomegaly and bulky lymphadenopathy.  She was then placed  back on ibrutinib 470m daily.  The lymphocytosis, anemia and thrombocytopenia slowly improved.  Due to neutropenia, ibrutinib was placed on hold in December 2017.  The ibrutinib was resumed in January 2018, but discontinued in August 2018  due to toxicities, mainly severe abdominal muscle cramping.  She was hospitalized in early September 2018 with urinary tract infection and pancytopenia.  She received multiple red blood cell and platelet transfusions during her stay.  She was admitted again in late September 2018 with perineal cellulitis extending to the lower abdomen and upper thigh, as well as persistent pancytopenia.  CT abdomen and pelvis revealed interval decrease in the abdominal lymphadenopathy, chronic massive splenomegaly with scattered small splenic infarcts, and small left pleural effusion with bibasilar atelectasis.  She required packed red blood cells while hospitalized.  She was discharged home with home health [facility]  We continued to follow her closely, but did not place her on a treatment due to the persistent cellulitis.  She then was admitted in October 2018 with severe hypercalcemia, with a calcium of 15.  She was treated with IV fluids, zoledronic acid and Calcitonin injections.  We continued to monitor her closely.  She had recurrent hypercalcemia, for which she received IV fluids and zolendronic acid as an outpatient.     Due to worsening pancytopenia, she underwent bone marrow biopsy in January 2019.   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  The cellulitis had improved to a point we felt she could be treated again, so she was placed on venetoclax CLL in January 2019.  She has continued on venetoclax 400 mg daily after ramp up dosing and has tolerated that fairly well, except for mild diarrhea.  Since starting venetoclax, she has had decreasing splenomegaly and improvement in her pancytopenia.  She initially continued to require IV fluids and zoledronic acid for the hypercalcemia.  Her last dose of zoledronic acid was given in March 2019.  In March, she had an irregular heart rhythm and EKG revealed occasional premature  atrial complexes, as well as an incomplete right bundle branch block.  This was stable compared to EKG done in October 2018.  Quantitative immunoglobulins done in August 2019 revealed a normal IgG, with low IgA and IgM.  She has continued IVIG monthly.  She had an episode of Campylobacter diarrhea in November 2019, which was treated with azithromycin for 7 days with resolution of her diarrhea.  She was evaluated by GI who recommended colonoscopy in May of next year.  Venetoclax was held temporarily until her diarrhea resolved.  She has since tolerated venetoclax without significant difficulty.     She completed a full 2 years of monotherapy with venetoclax in January 2021.  We recommended continuing venetoclax 400 mg daily until progression of disease or unacceptable toxicities.  Annual bilateral mammogram in Apri 2021 revealed a possible mass in the left breast, which warranted further evaluation. Right breast did not reveal any evidence of malignancy.  Unilateral diagnostic left mammogram in May, revealed benign fibrocystic change within the outer left breast with no evidence of malignancy.  Routine annual bilateral mammography was recommended.  I  In February 2022, she had a right diagnostic mammogram due to right breast subareolar tenderness, which did not reveal any evidence of malignancy.  CT abdomen in April did not reveal any acute abnormality or other findings to explain the left-sided abdominal pain.  There was resolution of previously seen gross splenomegaly.  There were prominent retroperitoneal lymph nodes, which had significantly diminished in size compared to prior exam.  A hiatal hernia was seen. There was descending colonic diverticulosis without evidence of acute diverticulitis.  Bilateral screening mammogram in May 2022 revealed possible asymmetry in the left breast.  Left diagnostic mammogram and ultrasound in June revealed a cluster of cysts/apocrine cyst in the left breast at 2 o'clock, 3  cm the nipple, measuring 6 x 3 x 6 mm, unchanged from the exam dated May 2021. There were no solid masses or suspicious lesions. She will be due for screening mammogram in May 2023. She is up to date on colonoscopy with Dr. Lyda Jester as of June 2022 which was negative, and repeat in 10 years was advised.   INTERVAL HISTORY:  Samantha Warren is here today for repeat clinical assessment prior to her next IVIG.  She states she continues venetoclax daily without significant difficulty.  She denies fevers, chills or night sweats. She reports stable right knee pain. Her appetite is good. Her weight has been stable. She has not had a flu vaccine yet, so we will arrange for that tomorrow.  REVIEW OF SYSTEMS:  Review of Systems  Constitutional:  Negative for appetite change, chills, fatigue, fever and unexpected weight change.  HENT:   Negative for lump/mass, mouth sores and sore throat.   Respiratory:  Negative for cough and shortness of breath.   Cardiovascular:  Negative for chest pain and leg swelling.  Gastrointestinal:  Negative for abdominal pain, constipation, diarrhea, nausea and vomiting.  Endocrine: Negative for hot flashes.  Genitourinary:  Negative for difficulty urinating, dysuria, frequency and hematuria.   Musculoskeletal:  Positive for arthralgias (right knee). Negative for back pain and myalgias.  Skin:  Negative for rash.  Neurological:  Negative for dizziness and headaches.  Hematological:  Negative for adenopathy. Does not bruise/bleed easily.  Psychiatric/Behavioral:  Negative for depression and sleep disturbance. The patient is not nervous/anxious.     VITALS:  Blood pressure (!) 150/72, pulse 69, temperature 98.6 F (37 C), temperature source Oral, resp. rate 20, height 5' 1"  (1.549 m), weight 222 lb 8 oz (100.9 kg), SpO2 97 %.  Wt Readings from Last 3 Encounters:  01/22/21 222 lb 8 oz (100.9 kg)  12/26/20 222 lb 12 oz (101 kg)  12/25/20 222 lb (100.7 kg)    Body mass index is  42.04 kg/m.  Performance status (ECOG): 1 - Symptomatic but completely ambulatory  PHYSICAL EXAM:  Physical Exam Vitals and nursing note reviewed.  Constitutional:      General: She is not in acute distress.    Appearance: Normal appearance.  HENT:     Head: Normocephalic and atraumatic.     Mouth/Throat:     Mouth: Mucous membranes are moist.     Pharynx: Oropharynx is clear. No oropharyngeal exudate or posterior oropharyngeal erythema.  Eyes:     General: No scleral icterus.    Extraocular Movements: Extraocular movements intact.     Conjunctiva/sclera: Conjunctivae normal.     Pupils: Pupils are equal, round, and reactive to light.  Cardiovascular:     Rate and Rhythm: Normal rate and regular rhythm.     Heart sounds: Normal heart sounds. No murmur heard.   No friction rub. No gallop.  Pulmonary:     Effort: Pulmonary effort is normal.     Breath sounds: Normal breath sounds. No wheezing, rhonchi or rales.  Abdominal:     General: There is no distension.  Palpations: Abdomen is soft. There is no hepatomegaly, splenomegaly or mass.     Tenderness: There is no abdominal tenderness.  Musculoskeletal:        General: Normal range of motion.     Cervical back: Normal range of motion and neck supple. No tenderness.     Right lower leg: No edema.     Left lower leg: No edema.  Lymphadenopathy:     Cervical: No cervical adenopathy.     Upper Body:     Right upper body: No supraclavicular or axillary adenopathy.     Left upper body: No supraclavicular or axillary adenopathy.     Lower Body: No right inguinal adenopathy. No left inguinal adenopathy.  Skin:    General: Skin is warm and dry.     Coloration: Skin is not jaundiced.     Findings: No rash.  Neurological:     Mental Status: She is alert and oriented to person, place, and time.     Cranial Nerves: No cranial nerve deficit.  Psychiatric:        Mood and Affect: Mood normal.        Behavior: Behavior normal.         Thought Content: Thought content normal.    LABS:   CBC Latest Ref Rng & Units 01/22/2021 12/25/2020 10/30/2020  WBC - 2.7 3.2 3.0  Hemoglobin 12.0 - 16.0 11.8(A) 12.1 11.6(A)  Hematocrit 36 - 46 35(A) 37 35(A)  Platelets 150 - 399 61(A) 63(A) 65(A)   CMP Latest Ref Rng & Units 01/22/2021 12/25/2020 10/30/2020  BUN 4 - 21 20 18 19   Creatinine 0.5 - 1.1 0.8 0.9 0.9  Sodium 137 - 147 141 142 140  Potassium 3.4 - 5.3 4.2 3.8 4.2  Chloride 99 - 108 108 108 107  CO2 13 - 22 26(A) 25(A) 25(A)  Calcium 8.7 - 10.7 8.3(A) 8.9 8.9  Alkaline Phos 25 - 125 79 73 74  AST 13 - 35 28 26 25   ALT 7 - 35 25 22 23      No results found for: CEA1 / No results found for: CEA1 No results found for: PSA1 No results found for: OBS962 No results found for: CAN125  No results found for: TOTALPROTELP, ALBUMINELP, A1GS, A2GS, BETS, BETA2SER, GAMS, MSPIKE, SPEI No results found for: TIBC, FERRITIN, IRONPCTSAT No results found for: LDH  STUDIES:  No results found.    HISTORY:   Past Medical History:  Diagnosis Date   Cancer [facility])     Past Surgical History:  Procedure Laterality Date   arm surgery     GALLBLADDER SURGERY     TOTAL KNEE ARTHROPLASTY      Family History  Problem Relation Age of Onset   Cancer Mother    Hypertension Father    Hypertension Sister    Stroke Sister    Hypertension Brother    Stroke Brother     Social History:  reports that she has been smoking cigarettes. She has a 15.00 pack-year smoking history. She has never used smokeless tobacco. She reports current drug use. No history on file for alcohol use.The patient is alone today.  Allergies:  Allergies  Allergen Reactions   Metronidazole    Rituximab    Sulfa Antibiotics     Current Medications: Current Outpatient Medications  Medication Sig Dispense Refill   alendronate (FOSAMAX) 70 MG tablet      Ascorbic Acid (VITAMIN C WITH ROSE HIPS) 500 MG tablet Take 500 mg  by mouth daily.     benzonatate  (TESSALON) 100 MG capsule TAKE ONE CAPSULE BY MOUTH 3 TIMES A DAY AS NEEDED 60 capsule 5   cyclobenzaprine (FLEXERIL) 5 MG tablet  (Patient not taking: Reported on 01/22/2021)     diclofenac Sodium (VOLTAREN) 1 % GEL Apply topically.     hydrochlorothiazide (MICROZIDE) 12.5 MG capsule Take by mouth.     KLOR-CON M20 20 MEQ tablet      lidocaine (LIDODERM) 5 % 2 patches daily.     lidocaine-prilocaine (EMLA) cream Apply 1 application topically as needed. 30 g 5   NEOMYCIN-POLYMYXIN-HYDROCORTISONE (CORTISPORIN) 1 % SOLN otic solution      omeprazole (PRILOSEC) 20 MG capsule Take 1 capsule (20 mg total) by mouth daily. 90 capsule 3   oxyCODONE (ROXICODONE) 15 MG immediate release tablet Take 15 mg by mouth every 6 (six) hours as needed.     prochlorperazine (COMPAZINE) 10 MG tablet Take 1 tablet (10 mg total) by mouth every 6 (six) hours as needed for nausea or vomiting. 90 tablet 1   VENCLEXTA 100 MG tablet Take 400 mg by mouth daily.     Vibegron (GEMTESA) 75 MG TABS Take 75 mg by mouth at bedtime.      No current facility-administered medications for this visit.   Facility-Administered Medications Ordered in Other Visits  Medication Dose Route Frequency Provider Last Rate Last Admin   sodium chloride flush (NS) 0.9 % injection 10 mL  10 mL Intracatheter PRN Ki[place] Peifer A, PA-C   10 mL at 09/26/20 [phone]

## 2021-01-18 NOTE — Assessment & Plan Note (Addendum)
Well controlled with venetoclax 400mg  daily. Her blood counts remain stable. She knows to continue venetoclax daily. We will give her a flu vaccine tomorrow. We will plan to see her back in 4 weeks with a CBC and comprehensive metabolic panel for repeat clinical assessment.

## 2021-01-22 ENCOUNTER — Telehealth: Payer: Self-pay | Admitting: Hematology and Oncology

## 2021-01-22 ENCOUNTER — Other Ambulatory Visit: Payer: Self-pay

## 2021-01-22 ENCOUNTER — Encounter: Payer: Self-pay | Admitting: Hematology and Oncology

## 2021-01-22 ENCOUNTER — Inpatient Hospital Stay (INDEPENDENT_AMBULATORY_CARE_PROVIDER_SITE_OTHER): Payer: Medicare Other | Admitting: Hematology and Oncology

## 2021-01-22 ENCOUNTER — Other Ambulatory Visit: Payer: Self-pay | Admitting: Hematology and Oncology

## 2021-01-22 ENCOUNTER — Inpatient Hospital Stay: Payer: Medicare Other | Attending: Hematology and Oncology

## 2021-01-22 DIAGNOSIS — D819 Combined immunodeficiency, unspecified: Secondary | ICD-10-CM | POA: Insufficient documentation

## 2021-01-22 DIAGNOSIS — C9110 Chronic lymphocytic leukemia of B-cell type not having achieved remission: Secondary | ICD-10-CM

## 2021-01-22 DIAGNOSIS — Z23 Encounter for immunization: Secondary | ICD-10-CM | POA: Diagnosis not present

## 2021-01-22 LAB — CBC AND DIFFERENTIAL
HCT: 35 — AB (ref 36–46)
HCT: 35 — AB (ref 36–46)
Hemoglobin: 11.8 — AB (ref 12.0–16.0)
Hemoglobin: 11.8 — AB (ref 12.0–16.0)
Neutrophils Absolute: 1.11
Neutrophils Absolute: 1.11
Platelets: 61 — AB (ref 150–399)
Platelets: 61 — AB (ref 150–399)
WBC: 2.7
WBC: 2.7

## 2021-01-22 LAB — HEPATIC FUNCTION PANEL
ALT: 25 (ref 7–35)
AST: 28 (ref 13–35)
Alkaline Phosphatase: 79 (ref 25–125)
Bilirubin, Total: 0.4

## 2021-01-22 LAB — COMPREHENSIVE METABOLIC PANEL
Albumin: 4.1 (ref 3.5–5.0)
Calcium: 8.3 — AB (ref 8.7–10.7)

## 2021-01-22 LAB — BASIC METABOLIC PANEL
BUN: 20 (ref 4–21)
CO2: 26 — AB (ref 13–22)
Chloride: 108 (ref 99–108)
Creatinine: 0.8 (ref 0.5–1.1)
Glucose: 96
Potassium: 4.2 (ref 3.4–5.3)
Sodium: 141 (ref 137–147)

## 2021-01-22 LAB — CBC
RBC: 3.96 (ref 3.87–5.11)
RBC: 3.96 (ref 3.87–5.11)

## 2021-01-22 MED FILL — Immune Globulin (Human) IV Soln 10 GM/100ML: INTRAVENOUS | Qty: 450 | Status: AC

## 2021-01-22 NOTE — Telephone Encounter (Signed)
Per 10/24 LOS, patient scheduled for Nov Appt's.  Gave patient Appt Summary

## 2021-01-23 ENCOUNTER — Inpatient Hospital Stay: Payer: Medicare Other

## 2021-01-23 ENCOUNTER — Ambulatory Visit: Payer: Medicare Other

## 2021-01-23 VITALS — BP 171/77 | HR 59 | Temp 97.3°F | Resp 18 | Ht 61.0 in | Wt 223.5 lb

## 2021-01-23 DIAGNOSIS — D819 Combined immunodeficiency, unspecified: Secondary | ICD-10-CM | POA: Diagnosis not present

## 2021-01-23 DIAGNOSIS — Z23 Encounter for immunization: Secondary | ICD-10-CM

## 2021-01-23 DIAGNOSIS — D801 Nonfamilial hypogammaglobulinemia: Secondary | ICD-10-CM

## 2021-01-23 LAB — IGG, IGA, IGM
IgA: 19 mg/dL — ABNORMAL LOW (ref 64–422)
IgG (Immunoglobin G), Serum: 752 mg/dL (ref 586–1602)
IgM (Immunoglobulin M), Srm: 107 mg/dL (ref 26–217)

## 2021-01-23 MED ORDER — HEPARIN SOD (PORK) LOCK FLUSH 100 UNIT/ML IV SOLN
500.0000 [IU] | Freq: Once | INTRAVENOUS | Status: AC | PRN
  Administered 2021-01-23: 500 [IU]

## 2021-01-23 MED ORDER — INFLUENZA VAC SPLIT QUAD 0.5 ML IM SUSY: 0.5000 mL | PREFILLED_SYRINGE | Freq: Once | INTRAMUSCULAR | Status: AC

## 2021-01-23 MED ORDER — INFLUENZA VAC SPLIT QUAD 0.5 ML IM SUSY
0.5000 mL | PREFILLED_SYRINGE | Freq: Once | INTRAMUSCULAR | Status: AC
  Administered 2021-01-23: 0.5 mL via INTRAMUSCULAR
  Filled 2021-01-23: qty 0.5

## 2021-01-23 MED ORDER — DIPHENHYDRAMINE HCL 50 MG/ML IJ SOLN
25.0000 mg | Freq: Once | INTRAMUSCULAR | Status: AC
Start: 2021-01-23 — End: 2021-01-23
  Administered 2021-01-23: 25 mg via INTRAVENOUS
  Filled 2021-01-23: qty 1

## 2021-01-23 MED ORDER — IMMUNE GLOBULIN (HUMAN) 10 GM/100ML IV SOLN
0.9000 g/kg | Freq: Once | INTRAVENOUS | Status: AC
  Administered 2021-01-23: 45 g via INTRAVENOUS
  Filled 2021-01-23: qty 400

## 2021-01-23 MED ORDER — SODIUM CHLORIDE 0.9% FLUSH
10.0000 mL | INTRAVENOUS | Status: DC | PRN
  Administered 2021-01-23: 10 mL

## 2021-01-23 MED ORDER — ACETAMINOPHEN 325 MG PO TABS
650.0000 mg | ORAL_TABLET | Freq: Once | ORAL | Status: AC
  Administered 2021-01-23: 650 mg via ORAL
  Filled 2021-01-23: qty 2

## 2021-01-23 MED ORDER — DEXTROSE 5 % IV SOLN: Freq: Once | INTRAVENOUS | Status: AC

## 2021-01-23 NOTE — Patient Instructions (Signed)
Immune Globulin Injection What is this medication? IMMUNE GLOBULIN (im MUNE GLOB yoo lin) helps to prevent or reduce the severity of certain infections in patients who are at risk. This medicine is collected from the pooled blood of many donors. It is used to treat immune systemproblems, thrombocytopenia, and Kawasaki syndrome. This medicine may be used for other purposes; ask your health care provider orpharmacist if you have questions. COMMON BRAND NAME(S): ASCENIV, Baygam, BIVIGAM, Carimune, Carimune NF, cutaquig, Cuvitru, Flebogamma, Flebogamma DIF, GamaSTAN, GamaSTAN S/D, Gamimune N, Gammagard, Gammagard S/D, Gammaked, Gammaplex, Gammar-P IV, Gamunex, Gamunex-C, Hizentra, Iveegam, Iveegam EN, Octagam, Panglobulin, Panglobulin NF, panzyga, Polygam S/D, Privigen, Sandoglobulin, Venoglobulin-S, Vigam,Vivaglobulin, Xembify What should I tell my care team before I take this medication? They need to know if you have any of these conditions: diabetes extremely low or no immune antibodies in the blood heart disease history of blood clots hyperprolinemia infection in the blood, sepsis kidney disease recently received or scheduled to receive a vaccination an unusual or allergic reaction to human immune globulin, albumin, maltose, sucrose, other medicines, foods, dyes, or preservatives pregnant or trying to get pregnant breast-feeding How should I use this medication? This medicine is for injection into a muscle or infusion into a vein or skin. It is usually given by a health care professional in a hospital or clinicsetting. In rare cases, some brands of this medicine might be given at home. You will be taught how to give this medicine. Use exactly as directed. Take your medicineat regular intervals. Do not take your medicine more often than directed. Talk to your pediatrician regarding the use of this medicine in children. Whilethis drug may be prescribed for selected conditions, precautions do  apply. Overdosage: If you think you have taken too much of this medicine contact apoison control center or emergency room at once. NOTE: This medicine is only for you. Do not share this medicine with others. What if I miss a dose? It is important not to miss your dose. Call your doctor or health care professional if you are unable to keep an appointment. If you give yourself the medicine and you miss a dose, take it as soon as you can. If it is almost timefor your next dose, take only that dose. Do not take double or extra doses. What may interact with this medication? aspirin and aspirin-like medicines cisplatin cyclosporine medicines for infection like acyclovir, adefovir, amphotericin B, bacitracin, cidofovir, foscarnet, ganciclovir, gentamicin, pentamidine, vancomycin NSAIDS, medicines for pain and inflammation, like ibuprofen or naproxen pamidronate vaccines zoledronic acid This list may not describe all possible interactions. Give your health care provider a list of all the medicines, herbs, non-prescription drugs, or dietary supplements you use. Also tell them if you smoke, drink alcohol, or use illegaldrugs. Some items may interact with your medicine. What should I watch for while using this medication? Your condition will be monitored carefully while you are receiving thismedicine. This medicine is made from pooled blood donations of many different people. It may be possible to pass an infection in this medicine. However, the donors are screened for infections and all products are tested for HIV and hepatitis. The medicine is treated to kill most or all bacteria and viruses. Talk to yourdoctor about the risks and benefits of this medicine. Do not have vaccinations for at least 14 days before, or until at least 3months after receiving this medicine. What side effects may I notice from receiving this medication? Side effects that you should report to your doctor   or health care  professionalas soon as possible: allergic reactions like skin rash, itching or hives, swelling of the face, lips, or tongue blue colored lips or skin breathing problems chest pain or tightness fever signs and symptoms of aseptic meningitis such as stiff neck; sensitivity to light; headache; drowsiness; fever; nausea; vomiting; rash signs and symptoms of a blood clot such as chest pain; shortness of breath; pain, swelling, or warmth in the leg signs and symptoms of hemolytic anemia such as fast heartbeat; tiredness; dark yellow or brown urine; or yellowing of the eyes or skin signs and symptoms of kidney injury like trouble passing urine or change in the amount of urine sudden weight gain swelling of the ankles, feet, hands Side effects that usually do not require medical attention (report to yourdoctor or health care professional if they continue or are bothersome): diarrhea flushing headache increased sweating joint pain muscle cramps muscle pain nausea pain, redness, or irritation at site where injected tiredness This list may not describe all possible side effects. Call your doctor for medical advice about side effects. You may report side effects to FDA at1-800-FDA-1088. Where should I keep my medication? Keep out of the reach of children. This drug is usually given in a hospital or clinic and will not be stored athome. In rare cases, some brands of this medicine may be given at home. If you are using this medicine at home, you will be instructed on how to store thismedicine. Throw away any unused medicine after the expiration date on the label. NOTE: This sheet is a summary. It may not cover all possible information. If you have questions about this medicine, talk to your doctor, pharmacist, orhealth care provider.  2022 Elsevier/Gold Standard (2018-10-21 12:51:14)  

## 2021-01-23 NOTE — Progress Notes (Signed)
1300:PT STABLE AT TIME OF DISCHARGE

## 2021-02-12 NOTE — Progress Notes (Signed)
[place]  [address] [place],  [place]  [address] [phone]  Clinic Day:  02/19/2021  Referring physician: [name], MD  This document serves as a record of services personally performed by Samantha Poisson, MD. It was created on their behalf by [facility] E, a trained medical scribe. The creation of this record is based on the scribe's personal observations and the provider's statements to them.  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 Her blood counts remain stable.   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  She wanted to space out her treatments, but we recommended she continue IVIG every 4 weeks through the winter months.  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    She will proceed with IVIG tomorrow. She knows to continue venetoclax daily. We will plan to see her back in 4 weeks with a CBC and comprehensive metabolic panel prior to her next IVIG. Following that appointment, we can go back to a Monday and Tuesday schedule. The patient understands the plans discussed today and is in agreement with them.  She knows to contact our office if she develops concerns prior to her next appointment.  I provided 15 minutes of face-to-face time during this this encounter and > 50% was spent counseling as documented under my assessment and plan.    [id number]: [id number] Fax: [phone]  No orders of the defined types were placed in this encounter.     CHIEF COMPLAINT:  CC: Chronic lymphocytic leukemia with  associated immunodeficiency  Current Treatment: Venetoclax 400 mg daily with IVIG every 4 weeks   HISTORY OF PRESENT ILLNESS:  LDaleyssa Loiselleis a 75y.o. 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  She was on observation until November 2008. She was initially treated with chlorambucil and prednisone, as she refused intravenous chemotherapy.  She had a partial response to this regimen.  By January 2010, she had progressive disease with a white count over 200,000, with associated anemia, splenomegaly, thrombocytopenia, and adenopathy.  She then received 5 cycles of fludarabine with good response.  She did well until January 2012, when she had progression once again.  She had a single dose of bendamustine and rituximab, which kept her disease under control for over a year.  However, she had a severe allergic reaction to the rituximab, so has not received further rituximab.  She was hospitalized after the bendamustine because of severe pancytopenia and required multiple transfusions, so was placed on observation.  In March 2013, she had progression of disease again, so was treated with bendamustine for 6 cycles, at a 50% dose reduction, and once again had a excellent response.  She was on observation until August 2014, then had progression of disease, so was placed on ibrutinib 420 mg daily.  The ibrutinib had kept her disease under fairly good control, but then she develops severe bilateral lower extremity cellulitis in April 2016, so ibrutinib was placed on hold.  She had persistent cellulitis, as well as  Clostridium difficile colitis in May 2016 requiring hospitalization.  She was readmitted soon after discharge with worsening cellulitis, requiring  a prolonged hospitalization.  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 She eventually had resolution of the cellulitis   As she was  largely asymptomatic, we continued observation until February 2017, at which time she had worsening splenomegaly.  Repeat CT imaging in February showed progressive disease, with marked splenomegaly and bulky lymphadenopathy.  She was then placed back on ibrutinib 427m daily.  The lymphocytosis, anemia and thrombocytopenia slowly improved.  Due to neutropenia, ibrutinib was placed on hold in December 2017.  The ibrutinib was resumed in January 2018, but discontinued in August 2018 due to toxicities, mainly severe abdominal muscle cramping.  She was hospitalized in early September 2018 with urinary tract infection and pancytopenia.  She received multiple red blood cell and platelet transfusions during her stay.  She was admitted again in late September 2018 with perineal cellulitis extending to the lower abdomen and upper thigh, as well as persistent pancytopenia.  CT abdomen and pelvis revealed interval decrease in the abdominal lymphadenopathy, chronic massive splenomegaly with scattered small splenic infarcts, and small left pleural effusion with bibasilar atelectasis.  She required packed red blood cells while hospitalized.  She was discharged home with home health and followed up with the [facility]  We continued to follow her closely, but did not place her on a treatment due to the persistent cellulitis.  She then was admitted in October 2018 with severe hypercalcemia, with a calcium of 15.  She was treated with IV fluids, zoledronic acid and Calcitonin injections.  We continued to monitor her closely.  She had recurrent hypercalcemia, for which she received IV fluids and zolendronic acid as an outpatient.     Due to worsening pancytopenia, she underwent bone marrow biopsy in January 2019.   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  The cellulitis had improved to a point we felt she could be treated again, so she was placed on  venetoclax CLL in January 2019.  She has continued on venetoclax 400 mg daily after ramp up dosing and has tolerated that fairly well, except for mild diarrhea.  Since starting venetoclax, she has had decreasing splenomegaly and improvement in her pancytopenia.  She initially continued to require IV fluids and zoledronic acid for the hypercalcemia.  Her last dose of zoledronic acid was given in March 2019.  In March, she had an irregular heart rhythm and EKG revealed occasional premature atrial complexes, as well as an incomplete right bundle branch block.  This was stable compared to EKG done in October 2018.  Quantitative immunoglobulins done in August 2019 revealed a normal IgG, with low IgA and IgM.  She has continued IVIG monthly.  She had an episode of Campylobacter diarrhea in November 2019, which was treated with azithromycin for 7 days with resolution of her diarrhea.  She was evaluated by GI who recommended colonoscopy in May of next year.  Venetoclax was held temporarily until her diarrhea resolved.  She has since tolerated venetoclax without significant difficulty.     She completed a full 2 years of monotherapy with venetoclax in January 2021.  We recommended continuing venetoclax 400 mg daily until progression of disease or unacceptable toxicities.  Annual bilateral mammogram in Apri 2021 revealed a possible mass in the left breast, which warranted further evaluation. Right breast did not reveal any evidence of malignancy.  Unilateral diagnostic left mammogram in May, revealed benign fibrocystic change  within the outer left breast with no evidence of malignancy.  Routine annual bilateral mammography was recommended.  I  In February 2022, she had a right diagnostic mammogram due to right breast subareolar tenderness, which did not reveal any evidence of malignancy.  CT abdomen in April did not reveal any acute abnormality or other findings to explain the left-sided abdominal pain.  There was  resolution of previously seen gross splenomegaly.  There were prominent retroperitoneal lymph nodes, which had significantly diminished in size compared to prior exam.  A hiatal hernia was seen. There was descending colonic diverticulosis without evidence of acute diverticulitis.  Bilateral screening mammogram in May 2022 revealed possible asymmetry in the left breast.  Left diagnostic mammogram and ultrasound in June revealed a cluster of cysts/apocrine cyst in the left breast at 2 o'clock, 3 cm the nipple, measuring 6 x 3 x 6 mm, unchanged from the exam dated May 2021. There were no solid masses or suspicious lesions. She will be due for screening mammogram in May 2023. She is up to date on colonoscopy with Dr. Lyda Jester as of June 2022 which was negative, and repeat in 10 years was advised.   INTERVAL HISTORY:  Samantha Warren is here for routine follow up prior to her next IVIG. She continues venetoclax daily without difficulty. She states that she has been well and denies complaints. White count is fairly stable at 2.8 with an improved ANC of 1200, hemoglobin is stable to mildly worse at 11.6, and platelet have mildly decreased from 61,000 to 54,000. Chemistries are unremarkable. Her  appetite is good, and she has gained 1 and 1/2 pounds since her last visit.  She denies fever, chills or other signs of infection.  She denies nausea, vomiting, bowel issues, or abdominal pain.  She denies sore throat, cough, dyspnea, or chest pain.  REVIEW OF SYSTEMS:  Review of Systems  Constitutional: Negative.  Negative for appetite change, chills, fatigue, fever and unexpected weight change.  HENT:  Negative.    Eyes: Negative.   Respiratory: Negative.  Negative for chest tightness, cough, hemoptysis, shortness of breath and wheezing.   Cardiovascular: Negative.  Negative for chest pain, leg swelling and palpitations.  Gastrointestinal: Negative.  Negative for abdominal distention, abdominal pain, blood in stool,  constipation, diarrhea, nausea and vomiting.  Endocrine: Negative.   Genitourinary: Negative.  Negative for difficulty urinating, dysuria, frequency and hematuria.   Musculoskeletal: Negative.  Negative for arthralgias, back pain, flank pain, gait problem and myalgias.  Skin: Negative.   Neurological: Negative.  Negative for dizziness, extremity weakness, gait problem, headaches, light-headedness, numbness, seizures and speech difficulty.  Hematological: Negative.   Psychiatric/Behavioral: Negative.  Negative for depression and sleep disturbance. The patient is not nervous/anxious.     VITALS:  Blood pressure (!) 175/87, pulse 67, temperature 98.5 F (36.9 C), temperature source Oral, resp. rate 18, height _0  (1.549 m), weight 224 lb 1.6 oz (101.7 kg), SpO2 98 %.  Wt Readings from Last 3 Encounters:  02/19/21 224 lb 1.6 oz (101.7 kg)  01/23/21 223 lb 8 oz (101.4 kg)  01/22/21 222 lb 8 oz (100.9 kg)    Body mass index is 42.34 kg/m.  Performance status (ECOG): 0 - Asymptomatic  PHYSICAL EXAM:  Physical Exam Constitutional:      General: She is not in acute distress.    Appearance: Normal appearance. She is normal weight.  HENT:     Head: Normocephalic and atraumatic.  Eyes:     General: No scleral  icterus.    Extraocular Movements: Extraocular movements intact.     Conjunctiva/sclera: Conjunctivae normal.     Pupils: Pupils are equal, round, and reactive to light.  Cardiovascular:     Rate and Rhythm: Normal rate and regular rhythm.     Pulses: Normal pulses.     Heart sounds: Normal heart sounds. No murmur heard.   No friction rub. No gallop.  Pulmonary:     Effort: Pulmonary effort is normal. No respiratory distress.     Breath sounds: Normal breath sounds.  Abdominal:     General: Bowel sounds are normal. There is no distension.     Palpations: Abdomen is soft. There is no hepatomegaly, splenomegaly or mass.     Tenderness: There is no abdominal tenderness.   Musculoskeletal:        General: Normal range of motion.     Cervical back: Normal range of motion and neck supple.     Right lower leg: No edema.     Left lower leg: No edema.  Lymphadenopathy:     Cervical: No cervical adenopathy.  Skin:    General: Skin is warm and dry.  Neurological:     General: No focal deficit present.     Mental Status: She is alert and oriented to person, place, and time. Mental status is at baseline.  Psychiatric:        Mood and Affect: Mood normal.        Behavior: Behavior normal.        Thought Content: Thought content normal.        Judgment: Judgment normal.    LABS:   CBC Latest Ref Rng & Units 02/19/2021 01/22/2021 01/22/2021  WBC - 2.8 2.7 2.7  Hemoglobin 12.0 - 16.0 11.6(A) 11.8(A) 11.8(A)  Hematocrit 36 - 46 35(A) 35(A) 35(A)  Platelets 150 - 399 54(A) 61(A) 61(A)   CMP Latest Ref Rng & Units 02/19/2021 01/22/2021 12/25/2020  BUN 4 - 21 22(A) 20 18  Creatinine 0.5 - 1.1 0.8 0.8 0.9  Sodium 137 - 147 141 141 142  Potassium 3.4 - 5.3 3.9 4.2 3.8  Chloride 99 - 108 107 108 108  CO2 13 - 22 27(A) 26(A) 25(A)  Calcium 8.7 - 10.7 8.5(A) 8.3(A) 8.9  Alkaline Phos 25 - 125 68 79 73  AST 13 - 35 36(A) 28 26  ALT 7 - 35 33 25 22     STUDIES:  No results found.    HISTORY:   Allergies:  Allergies  Allergen Reactions   Metronidazole    Rituximab    Sulfa Antibiotics     Current Medications: Current Outpatient Medications  Medication Sig Dispense Refill   alendronate (FOSAMAX) 70 MG tablet      Ascorbic Acid (VITAMIN C WITH ROSE HIPS) 500 MG tablet Take 500 mg by mouth daily.     benzonatate (TESSALON) 100 MG capsule TAKE ONE CAPSULE BY MOUTH 3 TIMES A DAY AS NEEDED 60 capsule 5   cyclobenzaprine (FLEXERIL) 5 MG tablet  (Patient not taking: Reported on 01/22/2021)     diclofenac Sodium (VOLTAREN) 1 % GEL Apply topically.     hydrochlorothiazide (MICROZIDE) 12.5 MG capsule Take by mouth.     KLOR-CON M20 20 MEQ tablet       lidocaine (LIDODERM) 5 % 2 patches daily.     lidocaine-prilocaine (EMLA) cream Apply 1 application topically as needed. 30 g 5   NEOMYCIN-POLYMYXIN-HYDROCORTISONE (CORTISPORIN) 1 % SOLN otic solution  omeprazole (PRILOSEC) 20 MG capsule Take 1 capsule (20 mg total) by mouth daily. 90 capsule 3   oxyCODONE (ROXICODONE) 15 MG immediate release tablet Take 15 mg by mouth every 6 (six) hours as needed.     prochlorperazine (COMPAZINE) 10 MG tablet Take 1 tablet (10 mg total) by mouth every 6 (six) hours as needed for nausea or vomiting. 90 tablet 1   VENCLEXTA 100 MG tablet Take 400 mg by mouth daily.     Vibegron (GEMTESA) 75 MG TABS Take 75 mg by mouth at bedtime.      Current Facility-Administered Medications  Medication Dose Route Frequency Provider Last Rate Last Admin   0.9 %  sodium chloride infusion   Intravenous Once PRN Derwood Kaplan, MD       albuterol (PROVENTIL) (2.5 MG/3ML) 0.083% nebulizer solution 2.5 mg  2.5 mg Nebulization Once PRN Derwood Kaplan, MD       alteplase (CATHFLO ACTIVASE) injection 2 mg  2 mg Intracatheter Once PRN Derwood Kaplan, MD       diphenhydrAMINE (BENADRYL) injection 50 mg  50 mg Intravenous Once PRN Derwood Kaplan, MD       EPINEPHrine (EPI-PEN) injection 0.3 mg  0.3 mg Intramuscular Once PRN Derwood Kaplan, MD       famotidine (PEPCID) IVPB 20 mg premix  20 mg Intravenous Once PRN Derwood Kaplan, MD       heparin lock flush 100 unit/mL  250 Units Intracatheter Once PRN Derwood Kaplan, MD       heparin lock flush 100 unit/mL  500 Units Intracatheter Once PRN Georgann Housekeeper, Vida Roller A, PA-C       methylPREDNISolone sodium succinate (SOLU-MEDROL) 125 mg/2 mL injection 125 mg  125 mg Intravenous Once PRN Derwood Kaplan, MD       sodium chloride flush (NS) 0.9 % injection 10 mL  10 mL Intracatheter PRN Mosher, Vida Roller A, PA-C       sodium chloride flush (NS) 0.9 % injection 3 mL  3 mL Intracatheter Once PRN Derwood Kaplan, MD       Facility-Administered Medications Ordered in Other Visits  Medication Dose Route Frequency Provider Last Rate Last Admin   influenza vac split quadrivalent PF (FLUARIX) injection 0.5 mL  0.5 mL Intramuscular Once Derwood Kaplan, MD       sodium chloride flush (NS) 0.9 % injection 10 mL  10 mL Intracatheter PRN Mosher, Vida Roller A, PA-C   10 mL at 09/26/20 5930     I, Rita Ohara, am acting as scribe for Derwood Kaplan, MD  I have reviewed this report as typed by the medical scribe, and it is complete and accurate.

## 2021-02-19 ENCOUNTER — Inpatient Hospital Stay: Payer: Medicare Other | Attending: Hematology and Oncology

## 2021-02-19 ENCOUNTER — Encounter: Payer: Self-pay | Admitting: Oncology

## 2021-02-19 ENCOUNTER — Inpatient Hospital Stay (INDEPENDENT_AMBULATORY_CARE_PROVIDER_SITE_OTHER): Payer: Medicare Other | Admitting: Oncology

## 2021-02-19 ENCOUNTER — Telehealth: Payer: Self-pay | Admitting: Oncology

## 2021-02-19 ENCOUNTER — Other Ambulatory Visit: Payer: Self-pay | Admitting: Hematology and Oncology

## 2021-02-19 ENCOUNTER — Other Ambulatory Visit: Payer: Self-pay

## 2021-02-19 ENCOUNTER — Other Ambulatory Visit: Payer: Self-pay | Admitting: Oncology

## 2021-02-19 VITALS — BP 175/87 | HR 67 | Temp 98.5°F | Resp 18 | Ht 61.0 in | Wt 224.1 lb

## 2021-02-19 DIAGNOSIS — D801 Nonfamilial hypogammaglobulinemia: Secondary | ICD-10-CM

## 2021-02-19 DIAGNOSIS — D61818 Other pancytopenia: Secondary | ICD-10-CM

## 2021-02-19 DIAGNOSIS — D819 Combined immunodeficiency, unspecified: Secondary | ICD-10-CM | POA: Diagnosis not present

## 2021-02-19 DIAGNOSIS — C9110 Chronic lymphocytic leukemia of B-cell type not having achieved remission: Secondary | ICD-10-CM | POA: Diagnosis not present

## 2021-02-19 DIAGNOSIS — M170 Bilateral primary osteoarthritis of knee: Secondary | ICD-10-CM | POA: Diagnosis not present

## 2021-02-19 DIAGNOSIS — Z96653 Presence of artificial knee joint, bilateral: Secondary | ICD-10-CM | POA: Diagnosis not present

## 2021-02-19 DIAGNOSIS — D649 Anemia, unspecified: Secondary | ICD-10-CM | POA: Insufficient documentation

## 2021-02-19 DIAGNOSIS — Z79899 Other long term (current) drug therapy: Secondary | ICD-10-CM | POA: Insufficient documentation

## 2021-02-19 LAB — CBC: RBC: 3.87 (ref 3.87–5.11)

## 2021-02-19 LAB — CBC AND DIFFERENTIAL
HCT: 35 — AB (ref 36–46)
Hemoglobin: 11.6 — AB (ref 12.0–16.0)
Neutrophils Absolute: 1.2
Platelets: 54 — AB (ref 150–399)
WBC: 2.8

## 2021-02-19 LAB — HEPATIC FUNCTION PANEL
ALT: 33 (ref 7–35)
AST: 36 — AB (ref 13–35)
Alkaline Phosphatase: 68 (ref 25–125)
Bilirubin, Total: 0.6

## 2021-02-19 LAB — COMPREHENSIVE METABOLIC PANEL
Albumin: 4.1 (ref 3.5–5.0)
Calcium: 8.5 — AB (ref 8.7–10.7)

## 2021-02-19 LAB — BASIC METABOLIC PANEL
BUN: 22 — AB (ref 4–21)
CO2: 27 — AB (ref 13–22)
Chloride: 107 (ref 99–108)
Creatinine: 0.8 (ref 0.5–1.1)
Glucose: 104
Potassium: 3.9 (ref 3.4–5.3)
Sodium: 141 (ref 137–147)

## 2021-02-19 MED ORDER — SODIUM CHLORIDE 0.9% FLUSH: 10.0000 mL | INTRAVENOUS | Status: DC | PRN

## 2021-02-19 MED ORDER — SODIUM CHLORIDE 0.9% FLUSH: 3.0000 mL | Freq: Once | INTRAVENOUS | Status: DC | PRN

## 2021-02-19 MED ORDER — ALTEPLASE 2 MG IJ SOLR: 2.0000 mg | Freq: Once | INTRAMUSCULAR | Status: DC | PRN

## 2021-02-19 MED ORDER — HEPARIN SOD (PORK) LOCK FLUSH 100 UNIT/ML IV SOLN: 250.0000 [IU] | Freq: Once | INTRAVENOUS | Status: DC | PRN

## 2021-02-19 MED ORDER — HEPARIN SOD (PORK) LOCK FLUSH 100 UNIT/ML IV SOLN: 500.0000 [IU] | Freq: Once | INTRAVENOUS | Status: DC | PRN

## 2021-02-19 MED ORDER — SODIUM CHLORIDE 0.9% FLUSH
10.0000 mL | Freq: Once | INTRAVENOUS | Status: AC | PRN
  Administered 2021-02-19: 10 mL

## 2021-02-19 MED ORDER — FAMOTIDINE IN NACL 20-0.9 MG/50ML-% IV SOLN: 20.0000 mg | Freq: Once | INTRAVENOUS | Status: DC | PRN

## 2021-02-19 MED ORDER — EPINEPHRINE 0.3 MG/0.3ML IJ SOAJ: 0.3000 mg | Freq: Once | INTRAMUSCULAR | Status: DC | PRN

## 2021-02-19 MED ORDER — HEPARIN SOD (PORK) LOCK FLUSH 100 UNIT/ML IV SOLN
500.0000 [IU] | Freq: Once | INTRAVENOUS | Status: AC | PRN
  Administered 2021-02-19: 500 [IU]

## 2021-02-19 MED ORDER — METHYLPREDNISOLONE SODIUM SUCC 125 MG IJ SOLR: 125.0000 mg | Freq: Once | INTRAMUSCULAR | Status: DC | PRN

## 2021-02-19 MED ORDER — DIPHENHYDRAMINE HCL 50 MG/ML IJ SOLN: 50.0000 mg | Freq: Once | INTRAMUSCULAR | Status: DC | PRN

## 2021-02-19 MED ORDER — SODIUM CHLORIDE 0.9 % IV SOLN: Freq: Once | INTRAVENOUS | Status: DC | PRN

## 2021-02-19 MED ORDER — ALBUTEROL SULFATE (2.5 MG/3ML) 0.083% IN NEBU: 2.5000 mg | INHALATION_SOLUTION | Freq: Once | RESPIRATORY_TRACT | Status: DC | PRN

## 2021-02-19 MED FILL — Immune Globulin (Human) IV Soln 10 GM/100ML: INTRAVENOUS | Qty: 450 | Status: AC

## 2021-02-19 NOTE — Telephone Encounter (Signed)
Per 11/21 los next appt scheduled and given to patient 

## 2021-02-20 ENCOUNTER — Encounter: Payer: Self-pay | Admitting: Oncology

## 2021-02-20 ENCOUNTER — Inpatient Hospital Stay: Payer: Medicare Other

## 2021-02-20 VITALS — BP 209/97 | HR 71 | Temp 98.1°F | Resp 18 | Ht 61.0 in | Wt 222.0 lb

## 2021-02-20 DIAGNOSIS — I10 Essential (primary) hypertension: Secondary | ICD-10-CM

## 2021-02-20 DIAGNOSIS — D801 Nonfamilial hypogammaglobulinemia: Secondary | ICD-10-CM

## 2021-02-20 DIAGNOSIS — D819 Combined immunodeficiency, unspecified: Secondary | ICD-10-CM | POA: Diagnosis not present

## 2021-02-20 MED ORDER — HEPARIN SOD (PORK) LOCK FLUSH 100 UNIT/ML IV SOLN
500.0000 [IU] | Freq: Once | INTRAVENOUS | Status: AC | PRN
  Administered 2021-02-20: 500 [IU]

## 2021-02-20 MED ORDER — SODIUM CHLORIDE 0.9% FLUSH
10.0000 mL | Freq: Once | INTRAVENOUS | Status: AC | PRN
  Administered 2021-02-20: 10 mL

## 2021-02-20 MED ORDER — DEXTROSE 5 % IV SOLN: Freq: Once | INTRAVENOUS | Status: AC

## 2021-02-20 MED ORDER — ALTEPLASE 2 MG IJ SOLR: 2.0000 mg | Freq: Once | INTRAMUSCULAR | Status: DC | PRN

## 2021-02-20 MED ORDER — DIPHENHYDRAMINE HCL 50 MG/ML IJ SOLN
25.0000 mg | Freq: Once | INTRAMUSCULAR | Status: AC
  Administered 2021-02-20: 25 mg via INTRAVENOUS
  Filled 2021-02-20: qty 1

## 2021-02-20 MED ORDER — ACETAMINOPHEN 325 MG PO TABS
650.0000 mg | ORAL_TABLET | Freq: Once | ORAL | Status: AC
  Administered 2021-02-20: 650 mg via ORAL
  Filled 2021-02-20: qty 2

## 2021-02-20 MED ORDER — IMMUNE GLOBULIN (HUMAN) 10 GM/100ML IV SOLN
0.9000 g/kg | Freq: Once | INTRAVENOUS | Status: AC
  Administered 2021-02-20: 45 g via INTRAVENOUS
  Filled 2021-02-20: qty 400

## 2021-02-20 MED ORDER — CLONIDINE HCL 0.1 MG PO TABS
0.1000 mg | ORAL_TABLET | Freq: Once | ORAL | Status: AC
  Administered 2021-02-20: 0.1 mg via ORAL
  Filled 2021-02-20: qty 1

## 2021-02-20 NOTE — Patient Instructions (Addendum)
Immune Globulin Injection What is this medication? IMMUNE GLOBULIN (im MUNE  GLOB yoo lin) helps to prevent or reduce the severity of certain infections in patients who are at risk. This medicine is collected from the pooled blood of many donors. It is used to treat immune system problems, thrombocytopenia, and Kawasaki syndrome. This medicine may be used for other purposes; ask your health care provider or pharmacist if you have questions. COMMON BRAND NAME(S): ASCENIV, Baygam, BIVIGAM, Carimune, Carimune NF, cutaquig, Cuvitru, Flebogamma, Flebogamma DIF, GamaSTAN, GamaSTAN S/D, Gamimune N, Gammagard, Gammagard S/D, Gammaked, Gammaplex, Gammar-P IV, Gamunex, Gamunex-C, Hizentra, Iveegam, Iveegam EN, Octagam, Panglobulin, Panglobulin NF, panzyga, Polygam S/D, Privigen, Sandoglobulin, Venoglobulin-S, Vigam, Vivaglobulin, Xembify What should I tell my care team before I take this medication? They need to know if you have any of these conditions: diabetes extremely low or no immune antibodies in the blood heart disease history of blood clots hyperprolinemia infection in the blood, sepsis kidney disease recently received or scheduled to receive a vaccination an unusual or allergic reaction to human immune globulin, albumin, maltose, sucrose, other medicines, foods, dyes, or preservatives pregnant or trying to get pregnant breast-feeding How should I use this medication? This medicine is for injection into a muscle or infusion into a vein or skin. It is usually given by a health care professional in a hospital or clinic setting. In rare cases, some brands of this medicine might be given at home. You will be taught how to give this medicine. Use exactly as directed. Take your medicine at regular intervals. Do not take your medicine more often than directed. Talk to your pediatrician regarding the use of this medicine in children. While this drug may be prescribed for selected conditions, precautions  do apply. Overdosage: If you think you have taken too much of this medicine contact a poison control center or emergency room at once. NOTE: This medicine is only for you. Do not share this medicine with others. What if I miss a dose? It is important not to miss your dose. Call your doctor or health care professional if you are unable to keep an appointment. If you give yourself the medicine and you miss a dose, take it as soon as you can. If it is almost time for your next dose, take only that dose. Do not take double or extra doses. What may interact with this medication? aspirin and aspirin-like medicines cisplatin cyclosporine medicines for infection like acyclovir, adefovir, amphotericin B, bacitracin, cidofovir, foscarnet, ganciclovir, gentamicin, pentamidine, vancomycin NSAIDS, medicines for pain and inflammation, like ibuprofen or naproxen pamidronate vaccines zoledronic acid This list may not describe all possible interactions. Give your health care provider a list of all the medicines, herbs, non-prescription drugs, or dietary supplements you use. Also tell them if you smoke, drink alcohol, or use illegal drugs. Some items may interact with your medicine. What should I watch for while using this medication? Your condition will be monitored carefully while you are receiving this medicine. This medicine is made from pooled blood donations of many different people. It may be possible to pass an infection in this medicine. However, the donors are screened for infections and all products are tested for HIV and hepatitis. The medicine is treated to kill most or all bacteria and viruses. Talk to your doctor about the risks and benefits of this medicine. Do not have vaccinations for at least 14 days before, or until at least 3 months after receiving this medicine. What side effects may I notice   from receiving this medication? Side effects that you should report to your doctor or health care  professional as soon as possible: allergic reactions like skin rash, itching or hives, swelling of the face, lips, or tongue blue colored lips or skin breathing problems chest pain or tightness fever signs and symptoms of aseptic meningitis such as stiff neck; sensitivity to light; headache; drowsiness; fever; nausea; vomiting; rash signs and symptoms of a blood clot such as chest pain; shortness of breath; pain, swelling, or warmth in the leg signs and symptoms of hemolytic anemia such as fast heartbeat; tiredness; dark yellow or brown urine; or yellowing of the eyes or skin signs and symptoms of kidney injury like trouble passing urine or change in the amount of urine sudden weight gain swelling of the ankles, feet, hands Side effects that usually do not require medical attention (report to your doctor or health care professional if they continue or are bothersome): diarrhea flushing headache increased sweating joint pain muscle cramps muscle pain nausea pain, redness, or irritation at site where injected tiredness This list may not describe all possible side effects. Call your doctor for medical advice about side effects. You may report side effects to FDA at 1-800-FDA-1088. Where should I keep my medication? Keep out of the reach of children. This drug is usually given in a hospital or clinic and will not be stored at home. In rare cases, some brands of this medicine may be given at home. If you are using this medicine at home, you will be instructed on how to store this medicine. Throw away any unused medicine after the expiration date on the label. NOTE: This sheet is a summary. It may not cover all possible information. If you have questions about this medicine, talk to your doctor, pharmacist, or health care provider.  2022 Elsevier/Gold Standard (2018-10-21 00:00:00) Hypertension, Adult High blood pressure (hypertension) is when the force of blood pumping through the arteries  is too strong. The arteries are the blood vessels that carry blood from the heart throughout the body. Hypertension forces the heart to work harder to pump blood and may cause arteries to become narrow or stiff. Untreated or uncontrolled hypertension can cause a heart attack, heart failure, a stroke, kidney disease, and other problems. A blood pressure reading consists of a higher number over a lower number. Ideally, your blood pressure should be below 120/80. The first ("top") number is called the systolic pressure. It is a measure of the pressure in your arteries as your heart beats. The second ("bottom") number is called the diastolic pressure. It is a measure of the pressure in your arteries as the heart relaxes. What are the causes? The exact cause of this condition is not known. There are some conditions that result in or are related to high blood pressure. What increases the risk? Some risk factors for high blood pressure are under your control. The following factors may make you more likely to develop this condition: Smoking. Having type 2 diabetes mellitus, high cholesterol, or both. Not getting enough exercise or physical activity. Being overweight. Having too much fat, sugar, calories, or salt (sodium) in your diet. Drinking too much alcohol. Some risk factors for high blood pressure may be difficult or impossible to change. Some of these factors include: Having chronic kidney disease. Having a family history of high blood pressure. Age. Risk increases with age. Race. You may be at higher risk if you are African American. Gender. Men are at higher risk than  women before age 46. After age 47, women are at higher risk than men. Having obstructive sleep apnea. Stress. What are the signs or symptoms? High blood pressure may not cause symptoms. Very high blood pressure (hypertensive crisis) may cause: Headache. Anxiety. Shortness of breath. Nosebleed. Nausea and vomiting. Vision  changes. Severe chest pain. Seizures. How is this diagnosed? This condition is diagnosed by measuring your blood pressure while you are seated, with your arm resting on a flat surface, your legs uncrossed, and your feet flat on the floor. The cuff of the blood pressure monitor will be placed directly against the skin of your upper arm at the level of your heart. It should be measured at least twice using the same arm. Certain conditions can cause a difference in blood pressure between your right and left arms. Certain factors can cause blood pressure readings to be lower or higher than normal for a short period of time: When your blood pressure is higher when you are in a health care provider's office than when you are at home, this is called white coat hypertension. Most people with this condition do not need medicines. When your blood pressure is higher at home than when you are in a health care provider's office, this is called masked hypertension. Most people with this condition may need medicines to control blood pressure. If you have a high blood pressure reading during one visit or you have normal blood pressure with other risk factors, you may be asked to: Return on a different day to have your blood pressure checked again. Monitor your blood pressure at home for 1 week or longer. If you are diagnosed with hypertension, you may have other blood or imaging tests to help your health care provider understand your overall risk for other conditions. How is this treated? This condition is treated by making healthy lifestyle changes, such as eating healthy foods, exercising more, and reducing your alcohol intake. Your health care provider may prescribe medicine if lifestyle changes are not enough to get your blood pressure under control, and if: Your systolic blood pressure is above 130. Your diastolic blood pressure is above 80. Your personal target blood pressure may vary depending on your medical  conditions, your age, and other factors. Follow these instructions at home: Eating and drinking  Eat a diet that is high in fiber and potassium, and low in sodium, added sugar, and fat. An example eating plan is called the DASH (Dietary Approaches to Stop Hypertension) diet. To eat this way: Eat plenty of fresh fruits and vegetables. Try to fill one half of your plate at each meal with fruits and vegetables. Eat whole grains, such as whole-wheat pasta, brown rice, or whole-grain bread. Fill about one fourth of your plate with whole grains. Eat or drink low-fat dairy products, such as skim milk or low-fat yogurt. Avoid fatty cuts of meat, processed or cured meats, and poultry with skin. Fill about one fourth of your plate with lean proteins, such as fish, chicken without skin, beans, eggs, or tofu. Avoid pre-made and processed foods. These tend to be higher in sodium, added sugar, and fat. Reduce your daily sodium intake. Most people with hypertension should eat less than 1,500 mg of sodium a day. Do not drink alcohol if: Your health care provider tells you not to drink. You are pregnant, may be pregnant, or are planning to become pregnant. If you drink alcohol: Limit how much you use to: 0-1 drink a day for women. 0-2  drinks a day for men. Be aware of how much alcohol is in your drink. In the [place] one drink equals one 12 oz bottle of beer (355 mL), one 5 oz glass of wine (148 mL), or one 1 oz glass of hard liquor (44 mL). Lifestyle  Work with your health care provider to maintain a healthy body weight or to lose weight. Ask what an ideal weight is for you. Get at least 30 minutes of exercise most days of the week. Activities may include walking, swimming, or biking. Include exercise to strengthen your muscles (resistance exercise), such as Pilates or lifting weights, as part of your weekly exercise routine. Try to do these types of exercises for 30 minutes at least 3 days a week. Do not use  any products that contain nicotine or tobacco, such as cigarettes, e-cigarettes, and chewing tobacco. If you need help quitting, ask your health care provider. Monitor your blood pressure at home as told by your health care provider. Keep all follow-up visits as told by your health care provider. This is important. Medicines Take over-the-counter and prescription medicines only as told by your health care provider. Follow directions carefully. Blood pressure medicines must be taken as prescribed. Do not skip doses of blood pressure medicine. Doing this puts you at risk for problems and can make the medicine less effective. Ask your health care provider about side effects or reactions to medicines that you should watch for. Contact a health care provider if you: Think you are having a reaction to a medicine you are taking. Have headaches that keep coming back (recurring). Feel dizzy. Have swelling in your ankles. Have trouble with your vision. Get help right away if you: Develop a severe headache or confusion. Have unusual weakness or numbness. Feel faint. Have severe pain in your chest or abdomen. Vomit repeatedly. Have trouble breathing. Summary Hypertension is when the force of blood pumping through your arteries is too strong. If this condition is not controlled, it may put you at risk for serious complications. Your personal target blood pressure may vary depending on your medical conditions, your age, and other factors. For most people, a normal blood pressure is less than 120/80. Hypertension is treated with lifestyle changes, medicines, or a combination of both. Lifestyle changes include losing weight, eating a healthy, low-sodium diet, exercising more, and limiting alcohol. This information is not intended to replace advice given to you by your health care provider. Make sure you discuss any questions you have with your health care provider. Document Revised: 11/26/2017 Document  Reviewed: 11/26/2017 Elsevier Patient Education  [place].

## 2021-02-20 NOTE — Addendum Note (Signed)
Addended by: Juanetta Beets on: 02/20/2021 01:36 PM   Modules accepted: Orders

## 2021-02-20 NOTE — Addendum Note (Signed)
Addended by: Juanetta Beets on: 02/20/2021 02:44 PM   Modules accepted: Orders

## 2021-02-20 NOTE — Addendum Note (Signed)
Addended by: Juanetta Beets on: 02/20/2021 01:34 PM   Modules accepted: Orders

## 2021-02-20 NOTE — Progress Notes (Signed)
0903: Patient on admission today has hypertension. She also was more elevated at f/u 02/19/21. Reported to [facility]anne S[facility] patient asymptomatic and may proceed with monitoring with IVIG. 1017: B/p still 189/89.denies c/o pain or stress other than grandaughter she has concern for today. 1104: Reported b/p elevation 203/86, 182/83 manual, to Manuela Schwartz and  orders received to above orders to give the benadryl 25 IVP now/given. 1320: B/p continues elevated. Reported to [facility] PAC orders received to give clonidine 0.1 mg po /given and observe. Patient request to leave has PCP appt at 1400 today for evaluation on hypertension.Rosanne Sack [facility] agrees to discharge her for follow up.She has remained asymptomatic throughout the treatment today. 1332: PT discharge for F/u at PCP with Dr Truman Hayward office.

## 2021-02-25 ENCOUNTER — Encounter: Payer: Self-pay | Admitting: Oncology

## 2021-03-05 ENCOUNTER — Encounter: Payer: Self-pay | Admitting: Oncology

## 2021-03-05 ENCOUNTER — Other Ambulatory Visit (HOSPITAL_COMMUNITY): Payer: Self-pay

## 2021-03-15 ENCOUNTER — Encounter: Payer: Self-pay | Admitting: Oncology

## 2021-03-15 NOTE — Addendum Note (Signed)
Addended by: Juanetta Beets on: 03/15/2021 03:31 PM   Modules accepted: Orders

## 2021-03-20 ENCOUNTER — Encounter: Payer: Self-pay | Admitting: Hematology and Oncology

## 2021-03-20 ENCOUNTER — Inpatient Hospital Stay: Payer: Medicare Other | Attending: Hematology and Oncology | Admitting: Hematology and Oncology

## 2021-03-20 ENCOUNTER — Telehealth: Payer: Self-pay | Admitting: Hematology and Oncology

## 2021-03-20 ENCOUNTER — Inpatient Hospital Stay: Payer: Medicare Other

## 2021-03-20 ENCOUNTER — Other Ambulatory Visit: Payer: Self-pay

## 2021-03-20 DIAGNOSIS — D819 Combined immunodeficiency, unspecified: Secondary | ICD-10-CM | POA: Diagnosis not present

## 2021-03-20 DIAGNOSIS — F17210 Nicotine dependence, cigarettes, uncomplicated: Secondary | ICD-10-CM | POA: Insufficient documentation

## 2021-03-20 DIAGNOSIS — Z79899 Other long term (current) drug therapy: Secondary | ICD-10-CM | POA: Diagnosis not present

## 2021-03-20 DIAGNOSIS — C9110 Chronic lymphocytic leukemia of B-cell type not having achieved remission: Secondary | ICD-10-CM | POA: Diagnosis present

## 2021-03-20 LAB — BASIC METABOLIC PANEL
BUN: 21 (ref 4–21)
CO2: 26 — AB (ref 13–22)
Chloride: 108 (ref 99–108)
Creatinine: 0.8 (ref 0.5–1.1)
Glucose: 107
Potassium: 4.2 (ref 3.4–5.3)
Sodium: 139 (ref 137–147)

## 2021-03-20 LAB — CBC AND DIFFERENTIAL
HCT: 62 — AB (ref 36–46)
Hemoglobin: 12 (ref 12.0–16.0)
Neutrophils Absolute: 1.28
WBC: 2.9

## 2021-03-20 LAB — HEPATIC FUNCTION PANEL
ALT: 28 (ref 7–35)
AST: 30 (ref 13–35)
Alkaline Phosphatase: 74 (ref 25–125)
Bilirubin, Total: 0.8

## 2021-03-20 LAB — VITAMIN D 25 HYDROXY (VIT D DEFICIENCY, FRACTURES): Vit D, 25-Hydroxy: 57.86 ng/mL (ref 30–100)

## 2021-03-20 LAB — COMPREHENSIVE METABOLIC PANEL
Albumin: 4.1 (ref 3.5–5.0)
Calcium: 8.3 — AB (ref 8.7–10.7)

## 2021-03-20 LAB — CBC
MCV: 90 (ref 81–99)
RBC: 4.02 (ref 3.87–5.11)

## 2021-03-20 MED ORDER — PROCHLORPERAZINE MALEATE 10 MG PO TABS: 10.0000 mg | ORAL_TABLET | Freq: Four times a day (QID) | ORAL | 1 refills | Status: DC | PRN

## 2021-03-20 NOTE — Assessment & Plan Note (Signed)
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  She continues IVIG every 4 weeks. She wanted to space out her IVIG treatments, but we recommended she continue this schedule through the winter months.

## 2021-03-20 NOTE — Assessment & Plan Note (Signed)
Her disease remains controlled with venetoclax, which she knows to continue daily. We will plan to see her back in 4 weeks with a CBC and comprehensive metabolic panel.

## 2021-03-20 NOTE — Progress Notes (Signed)
[place]  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 We will plan to see her back in 4 weeks with a CBC and comprehensive metabolic panel.  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  She continues IVIG every 4 weeks.  She wanted to space out her IVIG treatments, but we recommended she continue this schedule through the winter months.  Hypocalcemia Magnesium and vitamin D were normal. She is not on a calcium/vitamin D supplement, so I will have her start onedaily.   The patient understands the plans discussed today and is in agreement with them.  She knows to contact our office if she develops concerns prior to her next appointment.      [name], PA-C  [facility] AT [facility] [address] [place] [address] Dept: [phone] Dept Fax: [phone]   Orders Placed This Encounter  Procedures   CBC and differential    This external order was created through the Results Console.   CBC    This external order was created through the Results Console.   CBC    This order was created through External Result Entry   Basic metabolic panel    This external order was created through the Results Console.   Comprehensive metabolic panel    This external order was created through the Results Console.   Hepatic function panel    This external order was created through the Results Console.   VITAMIN D 25 Hydroxy (Vit-D Deficiency, Fractures)    Standing Status:   Future    Number of Occurrences:   1    Standing Expiration Date:    03/20/2022      CHIEF COMPLAINT:  CC: Chronic lymphocytic leukemia with combined immunodeficiency  Current Treatment: Venetoclax/IVIG   HISTORY OF PRESENT ILLNESS:  Samantha Warren is a 75 year old female with chronic lymphocytic leukemia originally diagnosed in May 2005.  She was on observation until November 2008. She was initially treated with chlorambucil and prednisone, as she refused intravenous chemotherapy.  She had a partial response to this regimen.  By January 2010, she had progressive disease with a white count over 200,000, with associated anemia, splenomegaly, thrombocytopenia, and adenopathy.  She then received 5 cycles of fludarabine with good response.  She did well until January 2012, when she had progression once again.  She had a single dose of bendamustine and rituximab, which kept her disease under control for over a year.  However, she had a severe allergic reaction to the rituximab, so has not received further rituximab.  She was hospitalized after the bendamustine because of severe pancytopenia and required multiple transfusions, so was placed on observation.  In March 2013, she had progression of disease again, so was treated with bendamustine for 6 cycles, at a 50% dose reduction, and once again had a excellent response.  She was on observation until August 2014, then had progression of disease, so was placed on ibrutinib 420 mg daily.  The ibrutinib had kept her disease under fairly good control, but then she develops severe bilateral lower extremity cellulitis in April 2016, so ibrutinib was placed on hold.  She had persistent cellulitis, as well as  Clostridium difficile colitis in May 2016 requiring hospitalization.  She was readmitted soon after discharge with worsening cellulitis, requiring a prolonged hospitalization.  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 She eventually had  resolution of the cellulitis   As she was largely asymptomatic, we continued observation until February 2017, at which time she had worsening splenomegaly.  Repeat CT imaging in February showed progressive disease, with marked splenomegaly and bulky lymphadenopathy.  She was then placed back on ibrutinib 449m daily.  The lymphocytosis, anemia and thrombocytopenia slowly improved.  Due to neutropenia, ibrutinib was placed on hold in December 2017.  The ibrutinib was resumed in January 2018, but discontinued in August 2018 due to toxicities, mainly severe abdominal muscle cramping.  She was hospitalized in early September 2018 with urinary tract infection and pancytopenia.  She received multiple red blood cell and platelet transfusions during her stay.  She was admitted again in late September 2018 with perineal cellulitis extending to the lower abdomen and upper thigh, as well as persistent pancytopenia.  CT abdomen and pelvis revealed interval decrease in the abdominal lymphadenopathy, chronic massive splenomegaly with scattered small splenic infarcts, and small left pleural effusion with bibasilar atelectasis.  She required packed red blood cells while hospitalized.  She was discharged home with home health and followed up with the [facility]  We continued to follow her closely, but did not place her on a treatment due to the persistent cellulitis.  She then was admitted in October 2018 with severe hypercalcemia, with a calcium of 15.  She was treated with IV fluids, zoledronic acid and Calcitonin injections.  We continued to monitor her closely.  She had recurrent hypercalcemia, for which she received IV fluids and zolendronic acid as an outpatient.     Due to worsening pancytopenia, she underwent bone marrow biopsy in January 2019.   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  The cellulitis had improved to a point we felt she could  be treated again, so she was placed on venetoclax CLL in January 2019.  She has continued on venetoclax 400 mg daily after ramp up dosing and has tolerated that fairly well, except for mild diarrhea.  Since starting venetoclax, she has had decreasing splenomegaly and improvement in her pancytopenia.  She initially continued to require IV fluids and zoledronic acid for the hypercalcemia.  Her last dose of zoledronic acid was given in March 2019.  In March, she had an irregular heart rhythm and EKG revealed occasional premature atrial complexes, as well as an incomplete right bundle branch block.  This was stable compared to EKG done in October 2018.  Quantitative immunoglobulins done in August 2019 revealed a normal IgG, with low IgA and IgM.  She has continued IVIG monthly.  She had an episode of Campylobacter diarrhea in November 2019, which was treated with azithromycin for 7 days with resolution of her diarrhea.  She was evaluated by GI who recommended colonoscopy in May of next year.  Venetoclax was held temporarily until her diarrhea resolved.  She has since tolerated venetoclax without significant difficulty.     She completed a full 2 years of monotherapy with venetoclax in January 2021.  We recommended continuing venetoclax 400 mg daily until progression of disease or unacceptable toxicities.  Annual bilateral mammogram in Apri 2021 revealed a possible mass in  the left breast, which warranted further evaluation. Right breast did not reveal any evidence of malignancy.  Unilateral diagnostic left mammogram in May, revealed benign fibrocystic change within the outer left breast with no evidence of malignancy.  Routine annual bilateral mammography was recommended.   In February 2022, she had a right diagnostic mammogram due to right breast subareolar tenderness, which did not reveal any evidence of malignancy.  CT abdomen in April did not reveal any acute abnormality or other findings to explain the  left-sided abdominal pain.  There was resolution of previously seen gross splenomegaly.  There were prominent retroperitoneal lymph nodes, which had significantly diminished in size compared to prior exam.  A hiatal hernia was seen. There was descending colonic diverticulosis without evidence of acute diverticulitis.  Bilateral screening mammogram in May 2022 revealed possible asymmetry in the left breast.  Left diagnostic mammogram and ultrasound in June revealed a cluster of cysts/apocrine cyst in the left breast at 2 o'clock, 3 cm the nipple, measuring 6 x 3 x 6 mm, unchanged from the exam dated May 2021. There were no solid masses or suspicious lesions. She will be due for screening mammogram in May 2023.  She is up to date on colonoscopy with her last being in June 2022 with Dr. Lyda Jester, which was negative.  INTERVAL HISTORY:  Kalene is here today for repeat clinical assessment prior to her next IVIG.  She reports awakening early morning hours, usually to go to the bathroom, but then having difficulty falling back asleep. This has been going on intermittently for some time. She previously attributed it to shift work. She denies symptoms of depression. She has not tried any over-the-counter sleep aid. I recommended she try melatonin and if that is not effective for her to discuss this with Dr. Truman Hayward. She has occasional nausea, for which medication is effective.  She denies fevers or chills. She reports stable right knee pain. Her appetite is good. Her weight has been stable.  She has been exercising on the treadmill.  She requests a refill of her Compazine today.  REVIEW OF SYSTEMS:  Review of Systems  Constitutional:  Negative for appetite change, chills, fatigue, fever and unexpected weight change.  HENT:   Negative for lump/mass, mouth sores and sore throat.   Respiratory:  Negative for cough and shortness of breath.   Cardiovascular:  Negative for chest pain and leg swelling.  Gastrointestinal:   Negative for abdominal pain, constipation, diarrhea, nausea and vomiting.  Endocrine: Negative for hot flashes.  Genitourinary:  Negative for difficulty urinating, dysuria, frequency and hematuria.   Musculoskeletal:  Positive for arthralgias (Right knee). Negative for back pain and myalgias.  Skin:  Negative for rash.  Neurological:  Negative for dizziness and headaches.  Hematological:  Negative for adenopathy. Does not bruise/bleed easily.  Psychiatric/Behavioral:  Positive for sleep disturbance (Early morning awakening). Negative for depression. The patient is not nervous/anxious.     VITALS:  Blood pressure (!) 149/67, pulse 80, temperature 98.2 F (36.8 C), temperature source Oral, resp. rate 20, height 5' 1"  (1.549 m), weight 222 lb 1.6 oz (100.7 kg), SpO2 99 %.  Wt Readings from Last 3 Encounters:  03/20/21 222 lb 1.6 oz (100.7 kg)  02/20/21 222 lb (100.7 kg)  02/19/21 224 lb 1.6 oz (101.7 kg)    Body mass index is 41.97 kg/m.  Performance status (ECOG): 1 - Symptomatic but completely ambulatory  PHYSICAL EXAM:  Physical Exam Vitals and nursing note reviewed.  Constitutional:  General: She is not in acute distress.    Appearance: Normal appearance.  HENT:     Head: Normocephalic and atraumatic.     Mouth/Throat:     Mouth: Mucous membranes are moist.     Pharynx: Oropharynx is clear. No oropharyngeal exudate or posterior oropharyngeal erythema.  Eyes:     General: No scleral icterus.    Extraocular Movements: Extraocular movements intact.     Conjunctiva/sclera: Conjunctivae normal.     Pupils: Pupils are equal, round, and reactive to light.  Cardiovascular:     Rate and Rhythm: Normal rate and regular rhythm.     Heart sounds: Normal heart sounds. No murmur heard.   No friction rub. No gallop.  Pulmonary:     Effort: Pulmonary effort is normal.     Breath sounds: Normal breath sounds. No wheezing, rhonchi or rales.  Abdominal:     General: There is no  distension.     Palpations: Abdomen is soft. There is no hepatomegaly, splenomegaly or mass.     Tenderness: There is no abdominal tenderness.  Musculoskeletal:        General: Normal range of motion.     Cervical back: Normal range of motion and neck supple. No tenderness.     Right lower leg: No edema.     Left lower leg: No edema.  Lymphadenopathy:     Cervical: No cervical adenopathy.     Upper Body:     Right upper body: No supraclavicular or axillary adenopathy.     Left upper body: No supraclavicular or axillary adenopathy.     Lower Body: No right inguinal adenopathy. No left inguinal adenopathy.  Skin:    General: Skin is warm and dry.     Coloration: Skin is not jaundiced.     Findings: No rash.  Neurological:     Mental Status: She is alert and oriented to person, place, and time.     Cranial Nerves: No cranial nerve deficit.  Psychiatric:        Mood and Affect: Mood normal.        Behavior: Behavior normal.        Thought Content: Thought content normal.    LABS:   CBC Latest Ref Rng & Units 03/20/2021 02/19/2021 01/22/2021  WBC - 2.9 2.8 2.7  Hemoglobin 12.0 - 16.0 12.0 11.6(A) 11.8(A)  Hematocrit 36 - 46 62(A) 35(A) 35(A)  Platelets 150 - 399 - 54(A) 61(A)   CMP Latest Ref Rng & Units 03/20/2021 02/19/2021 01/22/2021  BUN 4 - 21 21 22(A) 20  Creatinine 0.5 - 1.1 0.8 0.8 0.8  Sodium 137 - 147 139 141 141  Potassium 3.4 - 5.3 4.2 3.9 4.2  Chloride 99 - 108 108 107 108  CO2 13 - 22 26(A) 27(A) 26(A)  Calcium 8.7 - 10.7 8.3(A) 8.5(A) 8.3(A)  Alkaline Phos 25 - 125 74 68 79  AST 13 - 35 30 36(A) 28  ALT 7 - 35 28 33 25     No results found for: CEA1 / No results found for: CEA1 No results found for: PSA1 No results found for: YQI347 No results found for: CAN125  No results found for: TOTALPROTELP, ALBUMINELP, A1GS, A2GS, BETS, BETA2SER, GAMS, MSPIKE, SPEI No results found for: TIBC, FERRITIN, IRONPCTSAT No results found for: LDH  STUDIES:  No  results found.    HISTORY:   Past Medical History:  Diagnosis Date   Cancer [facility])     Past Surgical History:  Procedure Laterality Date   arm surgery     GALLBLADDER SURGERY     TOTAL KNEE ARTHROPLASTY      Family History  Problem Relation Age of Onset   Cancer Mother    Hypertension Father    Hypertension Sister    Stroke Sister    Hypertension Brother    Stroke Brother     Social History:  reports that she has been smoking cigarettes. She has a 15.00 pack-year smoking history. She has never used smokeless tobacco. She reports current drug use. No history on file for alcohol use.The patient is alone today.  Allergies:  Allergies  Allergen Reactions   Metronidazole    Rituximab    Sulfa Antibiotics     Current Medications: Current Outpatient Medications  Medication Sig Dispense Refill   alendronate (FOSAMAX) 70 MG tablet      amLODipine (NORVASC) 2.5 MG tablet Take 2.5 mg by mouth daily.     Ascorbic Acid (VITAMIN C WITH ROSE HIPS) 500 MG tablet Take 500 mg by mouth daily.     benzonatate (TESSALON) 100 MG capsule TAKE ONE CAPSULE BY MOUTH 3 TIMES A DAY AS NEEDED 60 capsule 5   cyclobenzaprine (FLEXERIL) 5 MG tablet  (Patient not taking: Reported on 01/22/2021)     diclofenac Sodium (VOLTAREN) 1 % GEL Apply topically.     hydrochlorothiazide (MICROZIDE) 12.5 MG capsule Take by mouth.     KLOR-CON M20 20 MEQ tablet      lidocaine (LIDODERM) 5 % 2 patches daily.     lidocaine-prilocaine (EMLA) cream Apply 1 application topically as needed. 30 g 5   NEOMYCIN-POLYMYXIN-HYDROCORTISONE (CORTISPORIN) 1 % SOLN otic solution      omeprazole (PRILOSEC) 20 MG capsule Take 1 capsule (20 mg total) by mouth daily. 90 capsule 3   oxyCODONE (ROXICODONE) 15 MG immediate release tablet Take 15 mg by mouth every 6 (six) hours as needed.     prochlorperazine (COMPAZINE) 10 MG tablet Take 1 tablet (10 mg total) by mouth every 6 (six) hours as needed for nausea or vomiting. 90 tablet 1    VENCLEXTA 100 MG tablet Take 400 mg by mouth daily.     Vibegron (GEMTESA) 75 MG TABS Take 75 mg by mouth at bedtime.      No current facility-administered medications for this visit.   Facility-Administered Medications Ordered in Other Visits  Medication Dose Route Frequency Provider Last Rate Last Admin   influenza vac split quadrivalent PF (FLUARIX) injection 0.5 mL  0.5 mL Intramuscular Once Derwood Kaplan, MD       sodium chloride flush (NS) 0.9 % injection 10 mL  10 mL Intracatheter PRN Zigmond Trela A, PA-C   10 mL at 09/26/20 [id number]

## 2021-03-20 NOTE — Assessment & Plan Note (Addendum)
Magnesium and vitamin D were normal. She is not on a calcium/vitamin D supplement, so I will have her start onedaily.

## 2021-03-20 NOTE — Telephone Encounter (Signed)
Per 12/20 LOS, patient scheduled for Jan Appt.  Gave patient Appt Summary/Calendar

## 2021-03-21 ENCOUNTER — Inpatient Hospital Stay: Payer: Medicare Other

## 2021-03-21 ENCOUNTER — Encounter: Payer: Self-pay | Admitting: Oncology

## 2021-03-21 ENCOUNTER — Other Ambulatory Visit: Payer: Self-pay

## 2021-03-21 VITALS — BP 163/70 | HR 59 | Temp 97.7°F | Resp 18 | Ht 61.0 in | Wt 222.2 lb

## 2021-03-21 DIAGNOSIS — C9110 Chronic lymphocytic leukemia of B-cell type not having achieved remission: Secondary | ICD-10-CM | POA: Diagnosis not present

## 2021-03-21 DIAGNOSIS — D801 Nonfamilial hypogammaglobulinemia: Secondary | ICD-10-CM

## 2021-03-21 MED ORDER — ACETAMINOPHEN 325 MG PO TABS
650.0000 mg | ORAL_TABLET | Freq: Once | ORAL | Status: AC
  Administered 2021-03-21: 650 mg via ORAL
  Filled 2021-03-21: qty 2

## 2021-03-21 MED ORDER — HEPARIN SOD (PORK) LOCK FLUSH 100 UNIT/ML IV SOLN: 500.0000 [IU] | Freq: Once | INTRAVENOUS | Status: DC | PRN

## 2021-03-21 MED ORDER — DEXTROSE 5 % IV SOLN: Freq: Once | INTRAVENOUS | Status: AC

## 2021-03-21 MED ORDER — ALTEPLASE 2 MG IJ SOLR: 2.0000 mg | Freq: Once | INTRAMUSCULAR | Status: DC | PRN

## 2021-03-21 MED ORDER — DIPHENHYDRAMINE HCL 50 MG/ML IJ SOLN
25.0000 mg | Freq: Once | INTRAMUSCULAR | Status: AC
  Administered 2021-03-21: 25 mg via INTRAVENOUS

## 2021-03-21 MED ORDER — SODIUM CHLORIDE 0.9% FLUSH
10.0000 mL | Freq: Once | INTRAVENOUS | Status: AC | PRN
  Administered 2021-03-21: 10 mL

## 2021-03-21 MED ORDER — IMMUNE GLOBULIN (HUMAN) 10 GM/100ML IV SOLN
0.9000 g/kg | Freq: Once | INTRAVENOUS | Status: AC
  Administered 2021-03-21: 45 g via INTRAVENOUS
  Filled 2021-03-21: qty 400

## 2021-03-21 MED ORDER — HEPARIN SOD (PORK) LOCK FLUSH 100 UNIT/ML IV SOLN
500.0000 [IU] | Freq: Once | INTRAVENOUS | Status: AC | PRN
  Administered 2021-03-21: 500 [IU]

## 2021-03-21 MED ORDER — DIPHENHYDRAMINE HCL 50 MG/ML IJ SOLN
25.0000 mg | Freq: Once | INTRAMUSCULAR | Status: AC
  Administered 2021-03-21: 25 mg via INTRAVENOUS
  Filled 2021-03-21: qty 1

## 2021-03-21 MED ORDER — DIPHENHYDRAMINE HCL 50 MG/ML IJ SOLN
25.0000 mg | Freq: Once | INTRAMUSCULAR | Status: DC
  Filled 2021-03-21: qty 1

## 2021-03-21 NOTE — Patient Instructions (Signed)

## 2021-03-21 NOTE — Progress Notes (Signed)
1239:PT STABLE AT TIME OF DISCHARGE

## 2021-03-30 ENCOUNTER — Telehealth: Payer: Self-pay | Admitting: Oncology

## 2021-03-30 NOTE — Telephone Encounter (Signed)
OK per patient/Dr Hinton Rao to move to [facility] scheduled on 1/16 due to working in another patient

## 2021-04-09 ENCOUNTER — Encounter: Payer: Self-pay | Admitting: Oncology

## 2021-04-16 ENCOUNTER — Telehealth: Payer: Self-pay | Admitting: Hematology and Oncology

## 2021-04-16 ENCOUNTER — Ambulatory Visit: Payer: Medicare Other | Admitting: Oncology

## 2021-04-16 ENCOUNTER — Inpatient Hospital Stay (INDEPENDENT_AMBULATORY_CARE_PROVIDER_SITE_OTHER): Payer: Medicare Other | Admitting: Hematology and Oncology

## 2021-04-16 ENCOUNTER — Inpatient Hospital Stay: Payer: Medicare Other | Attending: Hematology and Oncology

## 2021-04-16 ENCOUNTER — Encounter: Payer: Self-pay | Admitting: Hematology and Oncology

## 2021-04-16 ENCOUNTER — Other Ambulatory Visit: Payer: Self-pay | Admitting: Pharmacist

## 2021-04-16 ENCOUNTER — Other Ambulatory Visit: Payer: Self-pay

## 2021-04-16 ENCOUNTER — Inpatient Hospital Stay: Payer: Medicare Other

## 2021-04-16 VITALS — BP 153/76 | HR 73 | Temp 98.4°F | Resp 18 | Ht 61.0 in | Wt 217.3 lb

## 2021-04-16 DIAGNOSIS — D819 Combined immunodeficiency, unspecified: Secondary | ICD-10-CM

## 2021-04-16 DIAGNOSIS — D801 Nonfamilial hypogammaglobulinemia: Secondary | ICD-10-CM

## 2021-04-16 DIAGNOSIS — C9110 Chronic lymphocytic leukemia of B-cell type not having achieved remission: Secondary | ICD-10-CM

## 2021-04-16 LAB — CBC AND DIFFERENTIAL
HCT: 35 — AB (ref 36–46)
Hemoglobin: 11.5 — AB (ref 12.0–16.0)
Neutrophils Absolute: 1.3
Platelets: 55 — AB (ref 150–399)
WBC: 3.5

## 2021-04-16 LAB — BASIC METABOLIC PANEL
BUN: 22 — AB (ref 4–21)
CO2: 24 — AB (ref 13–22)
Chloride: 111 — AB (ref 99–108)
Creatinine: 0.8 (ref 0.5–1.1)
Glucose: 88
Potassium: 4.2 (ref 3.4–5.3)
Sodium: 139 (ref 137–147)

## 2021-04-16 LAB — HEPATIC FUNCTION PANEL
ALT: 28 (ref 7–35)
AST: 31 (ref 13–35)
Alkaline Phosphatase: 74 (ref 25–125)
Bilirubin, Total: 0.5

## 2021-04-16 LAB — CBC
MCV: 90 (ref 81–99)
RBC: 3.86 — AB (ref 3.87–5.11)

## 2021-04-16 LAB — COMPREHENSIVE METABOLIC PANEL
Albumin: 4.1 (ref 3.5–5.0)
Calcium: 8.4 — AB (ref 8.7–10.7)

## 2021-04-16 MED ORDER — HEPARIN SOD (PORK) LOCK FLUSH 100 UNIT/ML IV SOLN
500.0000 [IU] | Freq: Once | INTRAVENOUS | Status: AC | PRN
  Administered 2021-04-16: 500 [IU]

## 2021-04-16 MED ORDER — SODIUM CHLORIDE 0.9% FLUSH
10.0000 mL | INTRAVENOUS | Status: DC | PRN
  Administered 2021-04-16: 10 mL

## 2021-04-16 NOTE — Assessment & Plan Note (Signed)
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  She continues IVIG every 4 weeks. She wanted to space out her IVIG treatments, but we recommended she continue this schedule through the winter months.

## 2021-04-16 NOTE — Progress Notes (Signed)
[place]  [address] 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  She did not require treatment until 2008 and has since been through multiple therapies.  She is currently taking venetoclax 400 mg daily with good control of her disease. She knows to continue this daily. We will plan to see her back in 4 weeks with a CBC and comprehensive metabolic panel.  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  She continues IVIG every 4 weeks.  She wanted to space out her IVIG treatments, but we recommended she continue this schedule through the winter months.   The patient understands the plans discussed today and is in agreement with them.  She knows to contact our office if she develops concerns prior to her next appointment.     [name], PA-C  [facility] AT [facility] [address] [place] [address] Dept: [phone] Dept Fax: [phone]   Orders Placed This Encounter  Procedures   CBC and differential    This external order was created through the Results Console.   CBC    This external order was created through the Results Console.   CBC    This order was created through External Result Entry      CHIEF COMPLAINT:  CC: Chronic lymphocytic leukemia with combined immunodeficiency  Current Treatment:  Venetoclax/IVIG   HISTORY OF PRESENT ILLNESS:  Aliese Brannum is a 76 year old female with chronic lymphocytic leukemia originally diagnosed in May 2005.  She was on observation until November 2008. She was initially treated with chlorambucil and prednisone, as she refused  intravenous chemotherapy.  She had a partial response to this regimen.  By January 2010, she had progressive disease with a white count over 200,000, with associated anemia, splenomegaly, thrombocytopenia, and adenopathy.  She then received 5 cycles of fludarabine with good response.  She did well until January 2012, when she had progression once again.  She had a single dose of bendamustine and rituximab, which kept her disease under control for over a year.  However, she had a severe allergic reaction to the rituximab, so has not received further rituximab.  She was hospitalized after the bendamustine because of severe pancytopenia and required multiple transfusions, so was placed on observation.  In March 2013, she had progression of disease again, so was treated with bendamustine for 6 cycles, at a 50% dose reduction, and once again had a excellent response.  She was on observation until August 2014, then had progression of disease, so was placed on ibrutinib 420 mg daily.  The ibrutinib had kept her disease under fairly good control, but then she develops severe bilateral lower extremity cellulitis in April 2016, so ibrutinib was placed on hold.  She had persistent cellulitis, as well as  Clostridium difficile colitis in May 2016 requiring hospitalization.  She was readmitted soon after discharge with worsening cellulitis, requiring a prolonged hospitalization.  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 She eventually had resolution of the cellulitis   As she was largely asymptomatic, we continued observation until February 2017, at which time she had worsening splenomegaly.  Repeat CT imaging in February showed progressive disease, with marked splenomegaly and bulky lymphadenopathy.  She was then placed back on ibrutinib 489m daily.  The lymphocytosis, anemia and thrombocytopenia slowly improved.  Due to neutropenia,  ibrutinib was placed on hold in December 2017.  The ibrutinib was resumed in January 2018, but discontinued in August 2018 due to toxicities, mainly severe abdominal muscle cramping.  She was hospitalized in early September 2018 with urinary tract infection and pancytopenia.  She received multiple red blood cell and platelet transfusions during her stay.  She was admitted again in late September 2018 with perineal cellulitis extending to the lower abdomen and upper thigh, as well as persistent pancytopenia.  CT abdomen and pelvis revealed interval decrease in the abdominal lymphadenopathy, chronic massive splenomegaly with scattered small splenic infarcts, and small left pleural effusion with bibasilar atelectasis.  She required packed red blood cells while hospitalized.  She was discharged home with home health and [place] [facility]  We continued to follow her closely, but did not place her on a treatment due to the persistent cellulitis.  She then was admitted in October 2018 with severe hypercalcemia, with a calcium of 15.  She was treated with IV fluids, zoledronic acid and Calcitonin injections.  We continued to monitor her closely.  She had recurrent hypercalcemia, for which she received IV fluids and zolendronic acid as an outpatient.     Due to worsening pancytopenia, she underwent bone marrow biopsy in January 2019.   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  The cellulitis had improved to a point we felt she could be treated again, so she was placed on venetoclax CLL in January 2019.  She has continued on venetoclax 400 mg daily after ramp up dosing and has tolerated that fairly well, except for mild diarrhea.  Since starting venetoclax, she has had decreasing splenomegaly and improvement in her pancytopenia.  She initially continued to require IV fluids and zoledronic acid for the hypercalcemia.  Her last dose of  zoledronic acid was given in March 2019.  In March, she had an irregular heart rhythm and EKG revealed occasional premature atrial complexes, as well as an incomplete right bundle branch block.  This was stable compared to EKG done in October 2018.  Quantitative immunoglobulins done in August 2019 revealed a normal IgG, with low IgA and IgM.  She has continued IVIG monthly.  She had an episode of Campylobacter diarrhea in November 2019, which was treated with azithromycin for 7 days with resolution of her diarrhea.  She was evaluated by GI who recommended colonoscopy in May of next year.  Venetoclax was held temporarily until her diarrhea resolved.  She has since tolerated venetoclax without significant difficulty.     She completed a full 2 years of monotherapy with venetoclax in January 2021.  We recommended continuing venetoclax 400 mg daily until progression of disease or unacceptable toxicities.  Annual bilateral mammogram in Apri 2021 revealed a possible mass in the left breast, which warranted further evaluation. Right breast did not reveal any evidence of malignancy.  Unilateral diagnostic left mammogram in May, revealed benign fibrocystic change within the outer left breast with no evidence of malignancy.  Routine annual bilateral mammography was recommended.   In February 2022, she had a right diagnostic mammogram due to right breast subareolar tenderness, which did not reveal any evidence of malignancy.  CT abdomen in April did not reveal any acute abnormality  or other findings to explain the left-sided abdominal pain.  There was resolution of previously seen gross splenomegaly. There were prominent retroperitoneal lymph nodes, which had significantly diminished in size compared to prior exam.  A hiatal hernia was seen. There was descending colonic diverticulosis without evidence of acute diverticulitis.  Bilateral screening mammogram in May 2022 revealed possible asymmetry in the left breast.  Left  diagnostic mammogram and ultrasound in June revealed a cluster of cysts/apocrine cyst in the left breast at 2 o'clock, 3 cm the nipple, measuring 6 x 3 x 6 mm, unchanged from the exam dated May 2021. There were no solid masses or suspicious lesions. She will be due for screening mammogram in May 2023.  She is up to date on colonoscopy, with her last being in June 2022 with Dr. Lyda Jester, which was negative.   INTERVAL HISTORY:  Samantha Warren is here today for repeat clinical assessment prior to IVIG.  She states she continues venetoclax daily without significant difficulty.  She feels like she may have had the flu in early January.  She had fever, dry cough, nausea, vomiting and diarrhea which resolved after about 7 days.  Home COVID test was negative.  I advised her that as she is on oral chemotherapy, we would like to know when she is ill so we can evaluate her.  She denies continued symptoms of infection.  She denies night sweats.  She denies overt form of blood loss.  She reports stable knee pain, for which she uses oxycodone as needed. Her appetite is good. Her weight has decreased 3 pounds over last 4 weeks .  REVIEW OF SYSTEMS:  Review of Systems  Constitutional:  Negative for appetite change, chills, fatigue, fever and unexpected weight change.  HENT:   Negative for lump/mass, mouth sores and sore throat.   Respiratory:  Negative for cough and shortness of breath.   Cardiovascular:  Negative for chest pain and leg swelling.  Gastrointestinal:  Negative for abdominal pain, constipation, diarrhea, nausea and vomiting.  Endocrine: Negative for hot flashes.  Genitourinary:  Negative for difficulty urinating, dysuria, frequency and hematuria.   Musculoskeletal:  Positive for arthralgias (Right knee, stable). Negative for back pain and myalgias.  Skin:  Negative for rash.  Neurological:  Negative for dizziness and headaches.  Hematological:  Negative for adenopathy. Does not bruise/bleed easily.   Psychiatric/Behavioral:  Negative for depression and sleep disturbance. The patient is not nervous/anxious.     VITALS:  Blood pressure (!) 153/76, pulse 73, temperature 98.4 F (36.9 C), temperature source Oral, resp. rate 18, height 5' 1"  (1.549 m), weight 217 lb 4.8 oz (98.6 kg), SpO2 100 %.  Wt Readings from Last 3 Encounters:  04/16/21 217 lb 4.8 oz (98.6 kg)  03/21/21 222 lb 4 oz (100.8 kg)  03/20/21 222 lb 1.6 oz (100.7 kg)    Body mass index is 41.06 kg/m.  Performance status (ECOG): 0 - Asymptomatic  PHYSICAL EXAM:  Physical Exam Vitals and nursing note reviewed.  Constitutional:      General: She is not in acute distress.    Appearance: Normal appearance.  HENT:     Head: Normocephalic and atraumatic.     Mouth/Throat:     Mouth: Mucous membranes are moist.     Pharynx: Oropharynx is clear. No oropharyngeal exudate or posterior oropharyngeal erythema.  Eyes:     General: No scleral icterus.    Extraocular Movements: Extraocular movements intact.     Conjunctiva/sclera: Conjunctivae normal.  Pupils: Pupils are equal, round, and reactive to light.  Cardiovascular:     Rate and Rhythm: Normal rate and regular rhythm.     Heart sounds: Normal heart sounds. No murmur heard.   No friction rub. No gallop.  Pulmonary:     Effort: Pulmonary effort is normal.     Breath sounds: Normal breath sounds. No wheezing, rhonchi or rales.  Abdominal:     General: There is no distension.     Palpations: Abdomen is soft. There is no hepatomegaly, splenomegaly or mass.     Tenderness: There is no abdominal tenderness.  Musculoskeletal:        General: Normal range of motion.     Cervical back: Normal range of motion and neck supple. No tenderness.     Right lower leg: No edema.     Left lower leg: No edema.  Lymphadenopathy:     Cervical: No cervical adenopathy.     Upper Body:     Right upper body: No supraclavicular or axillary adenopathy.     Left upper body: No  supraclavicular or axillary adenopathy.     Lower Body: No right inguinal adenopathy. No left inguinal adenopathy.  Skin:    General: Skin is warm and dry.     Coloration: Skin is not jaundiced.     Findings: No rash.  Neurological:     Mental Status: She is alert and oriented to person, place, and time.     Cranial Nerves: No cranial nerve deficit.  Psychiatric:        Mood and Affect: Mood normal.        Behavior: Behavior normal.        Thought Content: Thought content normal.   LABS:   CBC Latest Ref Rng & Units 04/16/2021 03/20/2021 02/19/2021  WBC - 3.5 2.9 2.8  Hemoglobin 12.0 - 16.0 11.5(A) 12.0 11.6(A)  Hematocrit 36 - 46 35(A) 62(A) 35(A)  Platelets 150 - 399 55(A) - 54(A)   CMP Latest Ref Rng & Units 04/16/2021 03/20/2021 02/19/2021  BUN 4 - 21 22(A) 21 22(A)  Creatinine 0.5 - 1.1 0.8 0.8 0.8  Sodium 137 - 147 139 139 141  Potassium 3.4 - 5.3 4.2 4.2 3.9  Chloride 99 - 108 111(A) 108 107  CO2 13 - 22 24(A) 26(A) 27(A)  Calcium 8.7 - 10.7 8.4(A) 8.3(A) 8.5(A)  Alkaline Phos 25 - 125 74 74 68  AST 13 - 35 31 30 36(A)  ALT 7 - 35 28 28 33     No results found for: CEA1 / No results found for: CEA1 No results found for: PSA1 No results found for: IOE703 No results found for: CAN125  No results found for: TOTALPROTELP, ALBUMINELP, A1GS, A2GS, BETS, BETA2SER, GAMS, MSPIKE, SPEI No results found for: TIBC, FERRITIN, IRONPCTSAT No results found for: LDH  STUDIES:  No results found.    HISTORY:   Past Medical History:  Diagnosis Date   Cancer [facility])     Past Surgical History:  Procedure Laterality Date   arm surgery     GALLBLADDER SURGERY     TOTAL KNEE ARTHROPLASTY      Family History  Problem Relation Age of Onset   Cancer Mother    Hypertension Father    Hypertension Sister    Stroke Sister    Hypertension Brother    Stroke Brother     Social History:  reports that she has been smoking cigarettes. She has a 15.00 pack-year smoking  history.  She has never used smokeless tobacco. She reports current drug use. No history on file for alcohol use.The patient is alone due to teeth today.  Allergies:  Allergies  Allergen Reactions   Metronidazole    Rituximab    Sulfa Antibiotics     Current Medications: Current Outpatient Medications  Medication Sig Dispense Refill   Cholecalciferol (VITAMIN D3) 125 MCG (5000 UT) TABS Take 1 tablet by mouth daily.     Multiple Vitamin (MULTIVITAMIN WITH MINERALS) TABS tablet Take 1 tablet by mouth daily.     Omega-3 Fatty Acids (FISH OIL) 1000 MG CAPS Take 1 capsule by mouth daily.     vitamin B-12 (CYANOCOBALAMIN) 100 MCG tablet Take 100 mcg by mouth daily.     alendronate (FOSAMAX) 70 MG tablet      amLODipine (NORVASC) 2.5 MG tablet Take 2.5 mg by mouth daily.     Ascorbic Acid (VITAMIN C WITH ROSE HIPS) 500 MG tablet Take 500 mg by mouth daily.     benzonatate (TESSALON) 100 MG capsule TAKE ONE CAPSULE BY MOUTH 3 TIMES A DAY AS NEEDED 60 capsule 5   diclofenac Sodium (VOLTAREN) 1 % GEL Apply topically.     KLOR-CON M20 20 MEQ tablet      lidocaine (LIDODERM) 5 % 2 patches daily.     lidocaine-prilocaine (EMLA) cream Apply 1 application topically as needed. 30 g 5   NEOMYCIN-POLYMYXIN-HYDROCORTISONE (CORTISPORIN) 1 % SOLN otic solution      omeprazole (PRILOSEC) 20 MG capsule Take 1 capsule (20 mg total) by mouth daily. 90 capsule 3   oxyCODONE (ROXICODONE) 15 MG immediate release tablet Take 15 mg by mouth every 6 (six) hours as needed.     prochlorperazine (COMPAZINE) 10 MG tablet Take 1 tablet (10 mg total) by mouth every 6 (six) hours as needed for nausea or vomiting. 90 tablet 1   VENCLEXTA 100 MG tablet Take 400 mg by mouth daily.     Vibegron (GEMTESA) 75 MG TABS Take 75 mg by mouth at bedtime.      Current Facility-Administered Medications  Medication Dose Route Frequency Provider Last Rate Last Admin   sodium chloride flush (NS) 0.9 % injection 10 mL  10 mL Intracatheter PRN  Varonica Siharath, Vida Roller A, PA-C   10 mL at 04/16/21 0908   Facility-Administered Medications Ordered in Other Visits  Medication Dose Route Frequency Provider Last Rate Last Admin   influenza vac split quadrivalent PF (FLUARIX) injection 0.5 mL  0.5 mL Intramuscular Once Derwood Kaplan, MD       sodium chloride flush (NS) 0.9 % injection 10 mL  10 mL Intracatheter PRN Kaede Clendenen A, PA-C   10 mL at 09/26/20 [phone]

## 2021-04-16 NOTE — Assessment & Plan Note (Signed)
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  She did not require treatment until 2008 and has since been through multiple therapies.  She is currently taking venetoclax 400 mg daily with good control of her disease. She knows to continue this daily. We will plan to see her back in 4 weeks with a CBC and comprehensive metabolic panel.

## 2021-04-16 NOTE — Telephone Encounter (Signed)
Follow-up appts scheduled per 04/16/21 los. Pt given appt calendar.

## 2021-04-17 ENCOUNTER — Inpatient Hospital Stay: Payer: Medicare Other

## 2021-04-17 VITALS — BP 138/69 | HR 66 | Temp 98.3°F | Resp 18 | Ht 61.0 in | Wt 217.8 lb

## 2021-04-17 DIAGNOSIS — D819 Combined immunodeficiency, unspecified: Secondary | ICD-10-CM | POA: Diagnosis not present

## 2021-04-17 DIAGNOSIS — D801 Nonfamilial hypogammaglobulinemia: Secondary | ICD-10-CM

## 2021-04-17 MED ORDER — IMMUNE GLOBULIN (HUMAN) 10 GM/100ML IV SOLN
0.9000 g/kg | Freq: Once | INTRAVENOUS | Status: AC
  Administered 2021-04-17: 45 g via INTRAVENOUS
  Filled 2021-04-17: qty 450

## 2021-04-17 MED ORDER — HEPARIN SOD (PORK) LOCK FLUSH 100 UNIT/ML IV SOLN
500.0000 [IU] | Freq: Once | INTRAVENOUS | Status: AC | PRN
  Administered 2021-04-17: 500 [IU]

## 2021-04-17 MED ORDER — DEXTROSE 5 % IV SOLN: Freq: Once | INTRAVENOUS | Status: AC

## 2021-04-17 MED ORDER — SODIUM CHLORIDE 0.9% FLUSH
10.0000 mL | Freq: Once | INTRAVENOUS | Status: AC | PRN
  Administered 2021-04-17: 10 mL

## 2021-04-17 MED ORDER — DIPHENHYDRAMINE HCL 50 MG/ML IJ SOLN
25.0000 mg | Freq: Once | INTRAMUSCULAR | Status: AC
  Administered 2021-04-17: 25 mg via INTRAVENOUS
  Filled 2021-04-17: qty 1

## 2021-04-17 MED ORDER — ACETAMINOPHEN 325 MG PO TABS
650.0000 mg | ORAL_TABLET | Freq: Once | ORAL | Status: AC
  Administered 2021-04-17: 650 mg via ORAL
  Filled 2021-04-17: qty 2

## 2021-04-17 NOTE — Progress Notes (Signed)
1254:PT STABLE AT TIME OF DISCHARGE

## 2021-04-17 NOTE — Patient Instructions (Signed)

## 2021-05-08 NOTE — Progress Notes (Signed)
[facility]  [address] [place],  [place]  [address] [phone]  Clinic Day:  05/14/2021  Referring physician: [name], MD  This document serves as a record of services personally performed by Hosie Poisson, MD. It was created on their behalf by [facility], a trained medical scribe. The creation of this record is based on the scribe's personal observations and the provider's statements to them.  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  She did not require treatment until 2008 and has since been through multiple therapies.  She is currently taking venetoclax 400 mg daily with good control of her disease. Her blood counts remain stable.   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  She wanted to space out her treatments, but we recommended she continue IVIG every 4 weeks through the winter months.  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    She will proceed with IVIG tomorrow. She knows to continue venetoclax daily. We will plan to see her back in 4 weeks with a CBC and comprehensive metabolic panel prior to her next IVIG. The patient understands the plans discussed today and is in agreement with them.  She knows to contact our office if she develops concerns prior to her next appointment.  I provided 15 minutes of face-to-face time during this this encounter and > 50% was spent counseling as documented under my assessment and plan.    [facility] [address] [place] [address] Dept: [phone] Dept Fax: [phone]   No orders of the defined types were placed  in this encounter.      CHIEF COMPLAINT:  CC: Chronic lymphocytic leukemia with associated immunodeficiency  Current Treatment: Venetoclax 400 mg daily with IVIG every 4 weeks   HISTORY OF PRESENT ILLNESS:  Nicolina Hirt is a 76 y.o. 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  She was on observation until November 2008. She was initially treated with chlorambucil and prednisone, as she refused intravenous chemotherapy.  She had a partial response to this regimen.  By January 2010, she had progressive disease with a white count over 200,000, with associated anemia, splenomegaly, thrombocytopenia, and adenopathy.  She then received 5 cycles of fludarabine with good response.  She did well until January 2012, when she had progression once again.  She had a single dose of bendamustine and rituximab, which kept her disease under control for over a year.  However, she had a severe allergic reaction to the rituximab, so has not received further rituximab.  She was hospitalized after the bendamustine because of severe pancytopenia and required multiple transfusions, so was placed on observation.  In March 2013, she had progression of disease again, so was treated with bendamustine for 6 cycles, at a 50% dose reduction, and once again had a excellent response.  She was on observation until August 2014, then had progression of disease, so was placed on ibrutinib 420 mg daily.  The ibrutinib had kept her disease under fairly good control, but then she develops severe bilateral lower extremity cellulitis in April 2016, so ibrutinib was placed on hold.  She had persistent cellulitis, as well as  Clostridium  difficile colitis in May 2016 requiring hospitalization.  She was readmitted soon after discharge with worsening cellulitis, requiring a prolonged hospitalization.  She was also found to have severe combined immunodeficiency secondary to her CLL, so began receiving IVIG  monthly in June 2016. She eventually had resolution of the cellulitis   As she was largely asymptomatic, we continued observation until February 2017, at which time she had worsening splenomegaly.  Repeat CT imaging in February showed progressive disease, with marked splenomegaly and bulky lymphadenopathy.  She was then placed back on ibrutinib 469m daily.  The lymphocytosis, anemia and thrombocytopenia slowly improved.  Due to neutropenia, ibrutinib was placed on hold in December 2017.  The ibrutinib was resumed in January 2018, but discontinued in August 2018 due to toxicities, mainly severe abdominal muscle cramping.  She was hospitalized in early September 2018 with urinary tract infection and pancytopenia.  She received multiple red blood cell and platelet transfusions during her stay.  She was admitted again in late September 2018 with perineal cellulitis extending to the lower abdomen and upper thigh, as well as persistent pancytopenia.  CT abdomen and pelvis revealed interval decrease in the abdominal lymphadenopathy, chronic massive splenomegaly with scattered small splenic infarcts, and small left pleural effusion with bibasilar atelectasis.  She required packed red blood cells while hospitalized.  She was discharged home with home health and followed up with the [facility]  We continued to follow her closely, but did not place her on a treatment due to the persistent cellulitis.  She then was admitted in October 2018 with severe hypercalcemia, with a calcium of 15.  She was treated with IV fluids, zoledronic acid and Calcitonin injections.  We continued to monitor her closely.  She had recurrent hypercalcemia, for which she received IV fluids and zolendronic acid as an outpatient.     Due to worsening pancytopenia, she underwent bone marrow biopsy in January 2019.   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  The cellulitis  had improved to a point we felt she could be treated again, so she was placed on venetoclax CLL in January 2019.  She has continued on venetoclax 400 mg daily after ramp up dosing and has tolerated that fairly well, except for mild diarrhea.  Since starting venetoclax, she has had decreasing splenomegaly and improvement in her pancytopenia.  She initially continued to require IV fluids and zoledronic acid for the hypercalcemia.  Her last dose of zoledronic acid was given in March 2019.  In March, she had an irregular heart rhythm and EKG revealed occasional premature atrial complexes, as well as an incomplete right bundle branch block.  This was stable compared to EKG done in October 2018.  Quantitative immunoglobulins done in August 2019 revealed a normal IgG, with low IgA and IgM.  She has continued IVIG monthly.  She had an episode of Campylobacter diarrhea in November 2019, which was treated with azithromycin for 7 days with resolution of her diarrhea.  She was evaluated by GI who recommended colonoscopy in May of next year.  Venetoclax was held temporarily until her diarrhea resolved.  She has since tolerated venetoclax without significant difficulty.     She completed a full 2 years of monotherapy with venetoclax in January 2021.  We recommended continuing venetoclax 400 mg daily until progression of disease or unacceptable toxicities.  Annual bilateral mammogram in Apri 2021 revealed a possible mass in the left breast, which warranted further evaluation. Right breast  did not reveal any evidence of malignancy.  Unilateral diagnostic left mammogram in May, revealed benign fibrocystic change within the outer left breast with no evidence of malignancy.  Routine annual bilateral mammography was recommended.  I  In February 2022, she had a right diagnostic mammogram due to right breast subareolar tenderness, which did not reveal any evidence of malignancy.  CT abdomen in April did not reveal any acute  abnormality or other findings to explain the left-sided abdominal pain.  There was resolution of previously seen gross splenomegaly.  There were prominent retroperitoneal lymph nodes, which had significantly diminished in size compared to prior exam.  A hiatal hernia was seen. There was descending colonic diverticulosis without evidence of acute diverticulitis.  Bilateral screening mammogram in May 2022 revealed possible asymmetry in the left breast.  Left diagnostic mammogram and ultrasound in June revealed a cluster of cysts/apocrine cyst in the left breast at 2 o'clock, 3 cm the nipple, measuring 6 x 3 x 6 mm, unchanged from the exam dated May 2021. There were no solid masses or suspicious lesions. She will be due for screening mammogram in May 2023. She is up to date on colonoscopy with Dr. Lyda Jester as of June 2022 which was negative, and repeat in 10 years was advised.   INTERVAL HISTORY:  Samantha Warren is here for routine follow up prior to her next IVIG. She continues venetoclax 400 mg daily without difficulty. She states that she has a "bump" of the genitalia, and is scheduled with GYN on March 8th. Otherwise, she is doing well and denies complaints other than arthralgias of the bilateral knees. White count is stable at 3.5 with an Garrett of 1650, platelets have mildly improved to 61,000, and hemoglobin is normal at 12.2, previously 11.5. Chemistries are unremarkable except for a calcium of 8.1. Her  appetite is good, and she has lost 1 pounds since her last visit.  She denies fever, chills or other signs of infection.  She denies nausea, vomiting, bowel issues, or abdominal pain.  She denies sore throat, cough, dyspnea, or chest pain.  REVIEW OF SYSTEMS:  Review of Systems  Constitutional: Negative.  Negative for appetite change, chills, fatigue, fever and unexpected weight change.  HENT:  Negative.    Eyes: Negative.   Respiratory: Negative.  Negative for chest tightness, cough, hemoptysis, shortness of  breath and wheezing.   Cardiovascular: Negative.  Negative for chest pain, leg swelling and palpitations.  Gastrointestinal: Negative.  Negative for abdominal distention, abdominal pain, blood in stool, constipation, diarrhea, nausea and vomiting.  Endocrine: Negative.   Genitourinary:  Negative for difficulty urinating, dysuria, frequency and hematuria.        "Bump" of the genitalia  Musculoskeletal:  Positive for arthralgias (of the bilateral knees). Negative for back pain, flank pain, gait problem and myalgias.  Skin: Negative.   Neurological: Negative.  Negative for dizziness, extremity weakness, gait problem, headaches, light-headedness, numbness, seizures and speech difficulty.  Hematological: Negative.   Psychiatric/Behavioral: Negative.  Negative for depression and sleep disturbance. The patient is not nervous/anxious.     VITALS:  Blood pressure (!) 147/76, pulse 79, temperature 98.3 F (36.8 C), temperature source Oral, resp. rate 18, height 5' 1"  (1.549 m), weight 216 lb 3.2 oz (98.1 kg), SpO2 98 %.  Wt Readings from Last 3 Encounters:  05/14/21 216 lb 3.2 oz (98.1 kg)  04/17/21 217 lb 12 oz (98.8 kg)  04/16/21 217 lb 4.8 oz (98.6 kg)    Body mass index is 40.85  kg/m.  Performance status (ECOG): 0 - Asymptomatic  PHYSICAL EXAM:  Physical Exam Constitutional:      General: She is not in acute distress.    Appearance: Normal appearance. She is normal weight.  HENT:     Head: Normocephalic and atraumatic.  Eyes:     General: No scleral icterus.    Extraocular Movements: Extraocular movements intact.     Conjunctiva/sclera: Conjunctivae normal.     Pupils: Pupils are equal, round, and reactive to light.  Cardiovascular:     Rate and Rhythm: Normal rate and regular rhythm.     Pulses: Normal pulses.     Heart sounds: Normal heart sounds. No murmur heard.   No friction rub. No gallop.  Pulmonary:     Effort: Pulmonary effort is normal. No respiratory distress.      Breath sounds: Normal breath sounds.  Abdominal:     General: Bowel sounds are normal. There is no distension.     Palpations: Abdomen is soft. There is no hepatomegaly, splenomegaly or mass.     Tenderness: There is no abdominal tenderness.  Musculoskeletal:        General: Normal range of motion.     Cervical back: Normal range of motion and neck supple.     Right lower leg: No edema.     Left lower leg: No edema.  Lymphadenopathy:     Cervical: No cervical adenopathy.  Skin:    General: Skin is warm and dry.  Neurological:     General: No focal deficit present.     Mental Status: She is alert and oriented to person, place, and time. Mental status is at baseline.  Psychiatric:        Mood and Affect: Mood normal.        Behavior: Behavior normal.        Thought Content: Thought content normal.        Judgment: Judgment normal.    LABS:   CBC Latest Ref Rng & Units 05/14/2021 04/16/2021 03/20/2021  WBC - 3.5 3.5 2.9  Hemoglobin 12.0 - 16.0 12.2 11.5(A) 12.0  Hematocrit 36 - 46 37 35(A) 62(A)  Platelets 150 - 399 61(A) 55(A) -   CMP Latest Ref Rng & Units 05/14/2021 04/16/2021 03/20/2021  BUN 4 - 21 17 22(A) 21  Creatinine 0.5 - 1.1 0.8 0.8 0.8  Sodium 137 - 147 142 139 139  Potassium 3.4 - 5.3 4.0 4.2 4.2  Chloride 99 - 108 109(A) 111(A) 108  CO2 13 - 22 25(A) 24(A) 26(A)  Calcium 8.7 - 10.7 8.1(A) 8.4(A) 8.3(A)  Alkaline Phos 25 - 125 69 74 74  AST 13 - 35 27 31 30   ALT 7 - 35 25 28 28      STUDIES:  No results found.    HISTORY:   Allergies:  Allergies  Allergen Reactions   Metronidazole    Rituximab    Sulfa Antibiotics     Current Medications: Current Outpatient Medications  Medication Sig Dispense Refill   prochlorperazine (COMPAZINE) 10 MG tablet TAKE 1 TABLET BY MOUTH EVERY 6 HOURS AS NEEDED FOR NAUSEA OR VOMITING. 90 tablet 1   alendronate (FOSAMAX) 70 MG tablet      amLODipine (NORVASC) 2.5 MG tablet Take 2.5 mg by mouth daily.     Ascorbic Acid  (VITAMIN C WITH ROSE HIPS) 500 MG tablet Take 500 mg by mouth daily.     benzonatate (TESSALON) 100 MG capsule TAKE ONE CAPSULE BY MOUTH 3  TIMES A DAY AS NEEDED 60 capsule 5   Cholecalciferol (VITAMIN D3) 125 MCG (5000 UT) TABS Take 1 tablet by mouth daily.     diclofenac Sodium (VOLTAREN) 1 % GEL Apply topically.     KLOR-CON M20 20 MEQ tablet      lidocaine (LIDODERM) 5 % 2 patches daily.     lidocaine-prilocaine (EMLA) cream Apply 1 application topically as needed. 30 g 5   Multiple Vitamin (MULTIVITAMIN WITH MINERALS) TABS tablet Take 1 tablet by mouth daily.     NEOMYCIN-POLYMYXIN-HYDROCORTISONE (CORTISPORIN) 1 % SOLN otic solution      Omega-3 Fatty Acids (FISH OIL) 1000 MG CAPS Take 1 capsule by mouth daily.     omeprazole (PRILOSEC) 20 MG capsule Take 1 capsule (20 mg total) by mouth daily. 90 capsule 3   oxyCODONE (ROXICODONE) 15 MG immediate release tablet Take 15 mg by mouth every 6 (six) hours as needed.     VENCLEXTA 100 MG tablet Take 400 mg by mouth daily.     Vibegron (GEMTESA) 75 MG TABS Take 75 mg by mouth at bedtime.      vitamin B-12 (CYANOCOBALAMIN) 100 MCG tablet Take 100 mcg by mouth daily.     Current Facility-Administered Medications  Medication Dose Route Frequency Provider Last Rate Last Admin   sodium chloride flush (NS) 0.9 % injection 10 mL  10 mL Intracatheter PRN Mosher, Vida Roller A, PA-C   10 mL at 05/14/21 0854   Facility-Administered Medications Ordered in Other Visits  Medication Dose Route Frequency Provider Last Rate Last Admin   influenza vac split quadrivalent PF (FLUARIX) injection 0.5 mL  0.5 mL Intramuscular Once Derwood Kaplan, MD       sodium chloride flush (NS) 0.9 % injection 10 mL  10 mL Intracatheter PRN Mosher, Vida Roller A, PA-C   10 mL at 09/26/20 1093     I, Rita Ohara, am acting as scribe for Derwood Kaplan, MD  I have reviewed this report as typed by the medical scribe, and it is complete and accurate.

## 2021-05-09 ENCOUNTER — Other Ambulatory Visit: Payer: Self-pay | Admitting: Hematology and Oncology

## 2021-05-09 DIAGNOSIS — R112 Nausea with vomiting, unspecified: Secondary | ICD-10-CM

## 2021-05-09 DIAGNOSIS — T451X5A Adverse effect of antineoplastic and immunosuppressive drugs, initial encounter: Secondary | ICD-10-CM

## 2021-05-10 ENCOUNTER — Encounter: Payer: Self-pay | Admitting: Oncology

## 2021-05-14 ENCOUNTER — Encounter: Payer: Self-pay | Admitting: Oncology

## 2021-05-14 ENCOUNTER — Telehealth: Payer: Self-pay

## 2021-05-14 ENCOUNTER — Telehealth: Payer: Self-pay | Admitting: Oncology

## 2021-05-14 ENCOUNTER — Inpatient Hospital Stay: Payer: Medicare Other | Attending: Hematology and Oncology | Admitting: Oncology

## 2021-05-14 ENCOUNTER — Inpatient Hospital Stay: Payer: Medicare Other

## 2021-05-14 ENCOUNTER — Other Ambulatory Visit: Payer: Self-pay

## 2021-05-14 VITALS — BP 147/76 | HR 79 | Temp 98.3°F | Resp 18 | Ht 61.0 in | Wt 216.2 lb

## 2021-05-14 DIAGNOSIS — C9110 Chronic lymphocytic leukemia of B-cell type not having achieved remission: Secondary | ICD-10-CM

## 2021-05-14 DIAGNOSIS — D819 Combined immunodeficiency, unspecified: Secondary | ICD-10-CM | POA: Diagnosis present

## 2021-05-14 DIAGNOSIS — D61818 Other pancytopenia: Secondary | ICD-10-CM

## 2021-05-14 DIAGNOSIS — D801 Nonfamilial hypogammaglobulinemia: Secondary | ICD-10-CM | POA: Diagnosis not present

## 2021-05-14 LAB — HEPATIC FUNCTION PANEL
ALT: 25 (ref 7–35)
AST: 27 (ref 13–35)
Alkaline Phosphatase: 69 (ref 25–125)
Bilirubin, Total: 0.5

## 2021-05-14 LAB — CBC: RBC: 4.04 (ref 3.87–5.11)

## 2021-05-14 LAB — BASIC METABOLIC PANEL
BUN: 17 (ref 4–21)
CO2: 25 — AB (ref 13–22)
Chloride: 109 — AB (ref 99–108)
Creatinine: 0.8 (ref 0.5–1.1)
Glucose: 88
Potassium: 4 (ref 3.4–5.3)
Sodium: 142 (ref 137–147)

## 2021-05-14 LAB — COMPREHENSIVE METABOLIC PANEL
Albumin: 4 (ref 3.5–5.0)
Calcium: 8.1 — AB (ref 8.7–10.7)

## 2021-05-14 LAB — CBC AND DIFFERENTIAL
HCT: 37 (ref 36–46)
Hemoglobin: 12.2 (ref 12.0–16.0)
Neutrophils Absolute: 1.65
Platelets: 61 — AB (ref 150–399)
WBC: 3.5

## 2021-05-14 MED ORDER — SODIUM CHLORIDE 0.9% FLUSH
10.0000 mL | INTRAVENOUS | Status: DC | PRN
  Administered 2021-05-14: 10 mL

## 2021-05-14 MED ORDER — HEPARIN SOD (PORK) LOCK FLUSH 100 UNIT/ML IV SOLN
500.0000 [IU] | Freq: Once | INTRAVENOUS | Status: AC | PRN
  Administered 2021-05-14: 500 [IU]

## 2021-05-14 NOTE — Telephone Encounter (Signed)
-----   Message from Derwood Kaplan, MD sent at 05/14/2021 12:51 PM EST ----- Regarding: call Tell her calcium is very low, she needs to take twice daily or she won't benefit from the Vitamin D and the Fosamax.  They need calcium to work

## 2021-05-14 NOTE — Telephone Encounter (Signed)
Patient informed about calcium is very low. She needs to take a calcium supplement twice a day. Patient states she has not been taking any. I informed her to start taking it and she can get it over the counter.

## 2021-05-14 NOTE — Telephone Encounter (Signed)
Per 05/14/21  [facility] next appt scheduled and confirmed with patient

## 2021-05-15 ENCOUNTER — Other Ambulatory Visit: Payer: Self-pay

## 2021-05-15 ENCOUNTER — Inpatient Hospital Stay: Payer: Medicare Other

## 2021-05-15 VITALS — BP 143/67 | HR 75 | Temp 97.5°F | Resp 18 | Ht 61.0 in | Wt 219.2 lb

## 2021-05-15 DIAGNOSIS — C9110 Chronic lymphocytic leukemia of B-cell type not having achieved remission: Secondary | ICD-10-CM

## 2021-05-15 DIAGNOSIS — D801 Nonfamilial hypogammaglobulinemia: Secondary | ICD-10-CM

## 2021-05-15 DIAGNOSIS — D819 Combined immunodeficiency, unspecified: Secondary | ICD-10-CM | POA: Diagnosis not present

## 2021-05-15 MED ORDER — DIPHENHYDRAMINE HCL 50 MG/ML IJ SOLN
25.0000 mg | Freq: Once | INTRAMUSCULAR | Status: AC
  Administered 2021-05-15: 25 mg via INTRAVENOUS
  Filled 2021-05-15: qty 1

## 2021-05-15 MED ORDER — ACETAMINOPHEN 325 MG PO TABS
650.0000 mg | ORAL_TABLET | Freq: Once | ORAL | Status: AC
  Administered 2021-05-15: 650 mg via ORAL
  Filled 2021-05-15: qty 2

## 2021-05-15 MED ORDER — IMMUNE GLOBULIN (HUMAN) 10 GM/100ML IV SOLN
0.9000 g/kg | Freq: Once | INTRAVENOUS | Status: AC
  Administered 2021-05-15: 45 g via INTRAVENOUS
  Filled 2021-05-15: qty 450

## 2021-05-15 MED ORDER — VENCLEXTA 100 MG PO TABS: 400.0000 mg | ORAL_TABLET | Freq: Every day | ORAL | 0 refills | Status: DC

## 2021-05-15 MED ORDER — HEPARIN SOD (PORK) LOCK FLUSH 100 UNIT/ML IV SOLN
500.0000 [IU] | Freq: Once | INTRAVENOUS | Status: AC | PRN
  Administered 2021-05-15: 500 [IU]

## 2021-05-15 MED ORDER — ALTEPLASE 2 MG IJ SOLR: 2.0000 mg | Freq: Once | INTRAMUSCULAR | Status: DC | PRN

## 2021-05-15 MED ORDER — SODIUM CHLORIDE 0.9% FLUSH
10.0000 mL | Freq: Once | INTRAVENOUS | Status: AC | PRN
  Administered 2021-05-15: 10 mL

## 2021-05-15 MED ORDER — DEXTROSE 5 % IV SOLN: Freq: Once | INTRAVENOUS | Status: AC

## 2021-05-15 NOTE — Patient Instructions (Signed)

## 2021-05-15 NOTE — Progress Notes (Signed)
1236:PT STABLE AT TIME OF DISCHARGE

## 2021-05-19 ENCOUNTER — Encounter: Payer: Self-pay | Admitting: Oncology

## 2021-05-24 ENCOUNTER — Other Ambulatory Visit: Payer: Self-pay | Admitting: Hematology and Oncology

## 2021-05-24 DIAGNOSIS — C9110 Chronic lymphocytic leukemia of B-cell type not having achieved remission: Secondary | ICD-10-CM

## 2021-05-31 ENCOUNTER — Other Ambulatory Visit: Payer: Self-pay | Admitting: Oncology

## 2021-05-31 DIAGNOSIS — K219 Gastro-esophageal reflux disease without esophagitis: Secondary | ICD-10-CM

## 2021-06-11 ENCOUNTER — Encounter: Payer: Self-pay | Admitting: Hematology and Oncology

## 2021-06-11 ENCOUNTER — Other Ambulatory Visit: Payer: Self-pay

## 2021-06-11 ENCOUNTER — Inpatient Hospital Stay: Payer: Medicare Other

## 2021-06-11 ENCOUNTER — Inpatient Hospital Stay: Payer: Medicare Other | Attending: Hematology and Oncology | Admitting: Hematology and Oncology

## 2021-06-11 VITALS — BP 151/76 | HR 76 | Temp 98.6°F | Resp 20 | Ht 61.0 in | Wt 220.0 lb

## 2021-06-11 DIAGNOSIS — D819 Combined immunodeficiency, unspecified: Secondary | ICD-10-CM | POA: Insufficient documentation

## 2021-06-11 DIAGNOSIS — C9110 Chronic lymphocytic leukemia of B-cell type not having achieved remission: Secondary | ICD-10-CM | POA: Diagnosis not present

## 2021-06-11 DIAGNOSIS — D801 Nonfamilial hypogammaglobulinemia: Secondary | ICD-10-CM

## 2021-06-11 LAB — CBC AND DIFFERENTIAL
HCT: 38 (ref 36–46)
Hemoglobin: 12.3 (ref 12.0–16.0)
Neutrophils Absolute: 1.39
Platelets: 65 10*3/uL — AB (ref 150–400)
WBC: 3.4

## 2021-06-11 LAB — HEPATIC FUNCTION PANEL
ALT: 29 U/L (ref 7–35)
AST: 27 (ref 13–35)
Alkaline Phosphatase: 70 (ref 25–125)
Bilirubin, Total: 0.5

## 2021-06-11 LAB — BASIC METABOLIC PANEL
BUN: 20 (ref 4–21)
CO2: 26 — AB (ref 13–22)
Chloride: 107 (ref 99–108)
Creatinine: 0.8 (ref 0.5–1.1)
Glucose: 93
Potassium: 4 mEq/L (ref 3.5–5.1)
Sodium: 142 (ref 137–147)

## 2021-06-11 LAB — CBC: RBC: 4.2 (ref 3.87–5.11)

## 2021-06-11 LAB — COMPREHENSIVE METABOLIC PANEL
Albumin: 4.1 (ref 3.5–5.0)
Calcium: 8.5 — AB (ref 8.7–10.7)

## 2021-06-11 MED ORDER — HEPARIN SOD (PORK) LOCK FLUSH 100 UNIT/ML IV SOLN
500.0000 [IU] | Freq: Once | INTRAVENOUS | Status: AC | PRN
  Administered 2021-06-11: 500 [IU]

## 2021-06-11 MED ORDER — SODIUM CHLORIDE 0.9% FLUSH
10.0000 mL | INTRAVENOUS | Status: DC | PRN
  Administered 2021-06-11: 10 mL

## 2021-06-11 MED FILL — Immune Globulin (Human) IV Soln 10 GM/100ML: INTRAVENOUS | Qty: 450 | Status: AC

## 2021-06-11 NOTE — Assessment & Plan Note (Signed)
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 ?She did not require treatment until 2008 and has since been through multiple therapies. ?She is currently taking?venetoclax 400 mg daily with good control of her disease. Her blood counts remain stable. ?

## 2021-06-11 NOTE — Progress Notes (Signed)
?Patient Care Team: ?[name], MD as PCP - General (Internal Medicine) ?Derwood Kaplan, MD as PCP - Hematology/Oncology (Oncology) ? ?Clinic Day:  06/11/2021 ? ?Referring physician: [name], MD ? ?ASSESSMENT & PLAN:  ? 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  She did not require treatment until 2008 and has since been through multiple therapies.  She is currently taking venetoclax 400 mg daily with good control of her disease. Her blood counts remain stable. ? 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  She wanted to space out her treatments, but we recommended she continue IVIG every 4 weeks through the winter months. ?  ? ?The patient understands the plans discussed today and is in agreement with them.  She knows to contact our office if she develops concerns prior to her next appointment. ? ? ? ? ?[name], NP  ?[place] ?[place] ?[place] [address] ?Dept: [phone] ?Dept Fax: [phone]  ? ?Orders Placed This Encounter  ?Procedures  ? CBC and differential  ?  This external order was created through the Results Console.  ? CBC  ?  This external order was created through the Results Console.  ? Basic metabolic panel  ?  This external order was created through the Results Console.  ? Comprehensive metabolic panel  ?  This external order was created through the Results Console.  ? Hepatic function panel  ?  This external order was created through the Results Console.  ?  ? ? ?CHIEF COMPLAINT:  ?CC: [name] with history of CLL here for 4 week evaluation ? ?Current Treatment:  Ventoclax/ IVIG ? ?INTERVAL HISTORY:  ?Samantha Warren is here today for repeat clinical assessment. She denies fevers or chills. She denies pain. Her appetite is  good. Her weight has been stable. ? ?I have reviewed the past medical history, past surgical history, social history and family history with the patient and they are unchanged from previous note. ? ?ALLERGIES:  is allergic to metronidazole, rituximab, and sulfa antibiotics. ? ?MEDICATIONS:  ?Current Outpatient Medications  ?Medication Sig Dispense Refill  ? calcium carbonate (CALCIUM 600) 600 MG TABS tablet Take 600 mg by mouth daily.    ? prochlorperazine (COMPAZINE) 10 MG tablet TAKE 1 TABLET BY MOUTH EVERY 6 HOURS AS NEEDED FOR NAUSEA OR VOMITING. 90 tablet 1  ? alendronate (FOSAMAX) 70 MG tablet     ? amLODipine (NORVASC) 2.5 MG tablet Take 2.5 mg by mouth daily.    ? Ascorbic Acid (VITAMIN C WITH ROSE HIPS) 500 MG tablet Take 500 mg by mouth daily.    ? benzonatate (TESSALON) 100 MG capsule TAKE ONE CAPSULE BY MOUTH 3 TIMES A DAY AS NEEDED 60 capsule 5  ? Cholecalciferol (VITAMIN D3) 125 MCG (5000 UT) TABS Take 1 tablet by mouth daily.    ? diclofenac Sodium (VOLTAREN) 1 % GEL Apply topically.    ? KLOR-CON M20 20 MEQ tablet     ? lidocaine (LIDODERM) 5 % 2 patches daily.    ? lidocaine-prilocaine (EMLA) cream Apply 1 application topically as needed. 30 g 5  ? Multiple Vitamin (MULTIVITAMIN WITH MINERALS) TABS tablet Take 1 tablet by mouth daily.    ? NEOMYCIN-POLYMYXIN-HYDROCORTISONE (CORTISPORIN) 1 % SOLN otic solution     ? Omega-3 Fatty Acids (FISH OIL) 1000  MG CAPS Take 1 capsule by mouth daily.    ? omeprazole (PRILOSEC) 20 MG capsule TAKE 1 CAPSULE BY MOUTH EVERY DAY 90 capsule 3  ? oxyCODONE (ROXICODONE) 15 MG immediate release tablet Take 15 mg by mouth every 6 (six) hours as needed.    ? VENCLEXTA 100 MG tablet Take 4 tablets (400 mg total) by mouth daily. 30 tablet 0  ? Vibegron (GEMTESA) 75 MG TABS Take 75 mg by mouth at bedtime.     ? vitamin B-12 (CYANOCOBALAMIN) 100 MCG tablet Take 100 mcg by mouth daily.    ? ?Current Facility-Administered Medications  ?Medication Dose Route Frequency Provider  Last Rate Last Admin  ? sodium chloride flush (NS) 0.9 % injection 10 mL  10 mL Intracatheter PRN Mosher, Kelli A, PA-C   10 mL at 06/11/21 0911  ? ?Facility-Administered Medications Ordered in Other Visits  ?Medication Dose Route Frequency Provider Last Rate Last Admin  ? influenza vac split quadrivalent PF (FLUARIX) injection 0.5 mL  0.5 mL Intramuscular Once Derwood Kaplan, MD      ? sodium chloride flush (NS) 0.9 % injection 10 mL  10 mL Intracatheter PRN Mosher, Kelli A, PA-C   10 mL at 09/26/20 0839  ? ? ?HISTORY OF PRESENT ILLNESS:  ? ?Oncology History  ? No history exists.  ?  ? ? ?REVIEW OF SYSTEMS:  ? ?Constitutional: Denies fevers, chills or abnormal weight loss ?Eyes: Denies blurriness of vision ?Ears, nose, mouth, throat, and face: Denies mucositis or sore throat ?Respiratory: Denies cough, dyspnea or wheezes ?Cardiovascular: Denies palpitation, chest discomfort or lower extremity swelling ?Gastrointestinal:  Denies nausea, heartburn or change in bowel habits ?Skin: Denies abnormal skin rashes ?Lymphatics: Denies new lymphadenopathy or easy bruising ?Neurological:Denies numbness, tingling or new weaknesses ?Behavioral/Psych: Mood is stable, no new changes  ?All other systems were reviewed with the patient and are negative. ? ? ?VITALS:  ?Blood pressure (!) 151/76, pulse 76, temperature 98.6 ?F (37 ?C), temperature source Oral, resp. rate 20, height '5\' 1"'$  (1.549 m), weight 220 lb (99.8 kg), SpO2 97 %.  ?Wt Readings from Last 3 Encounters:  ?06/11/21 220 lb (99.8 kg)  ?05/15/21 219 lb 4 oz (99.5 kg)  ?05/14/21 216 lb 3.2 oz (98.1 kg)  ?  ?Body mass index is 41.57 kg/m?. ? ?Performance status (ECOG): 1 - Symptomatic but completely ambulatory ? ?PHYSICAL EXAM:  ? ?GENERAL:alert, no distress and comfortable ?SKIN: skin color, texture, turgor are normal, no rashes or significant lesions ?EYES: normal, Conjunctiva are pink and non-injected, sclera clear ?OROPHARYNX:no exudate, no erythema and lips,  buccal mucosa, and tongue normal  ?NECK: supple, thyroid normal size, non-tender, without nodularity ?LYMPH:  no palpable lymphadenopathy in the cervical, axillary or inguinal ?LUNGS: clear to auscultation and percussion with normal breathing effort ?HEART: regular rate & rhythm and no murmurs and no lower extremity edema ?ABDOMEN:abdomen soft, non-tender and normal bowel sounds ?Musculoskeletal:no cyanosis of digits and no clubbing  ?NEURO: alert & oriented x 3 with fluent speech, no focal motor/sensory deficits ? ?LABORATORY DATA:  ?I have reviewed the data as listed ?   ?Component Value Date/Time  ? NA 142 06/11/2021 0000  ? K 4.0 06/11/2021 0000  ? CL 107 06/11/2021 0000  ? CO2 26 (A) 06/11/2021 0000  ? BUN 20 06/11/2021 0000  ? CREATININE 0.8 06/11/2021 0000  ? CALCIUM 8.5 (A) 06/11/2021 0000  ? ALBUMIN 4.1 06/11/2021 0000  ? AST 27 06/11/2021 0000  ? ALT 29 06/11/2021 0000  ?  ALKPHOS 70 06/11/2021 0000  ? ? ?No results found for: SPEP, UPEP ? ?Lab Results  ?Component Value Date  ? WBC 3.4 06/11/2021  ? NEUTROABS 1.39 06/11/2021  ? HGB 12.3 06/11/2021  ? HCT 38 06/11/2021  ? MCV 90 04/16/2021  ? PLT 65 (A) 06/11/2021  ? ? ?  Chemistry   ?   ?Component Value Date/Time  ? NA 142 06/11/2021 0000  ? K 4.0 06/11/2021 0000  ? CL 107 06/11/2021 0000  ? CO2 26 (A) 06/11/2021 0000  ? BUN 20 06/11/2021 0000  ? CREATININE 0.8 06/11/2021 0000  ? GLU 93 06/11/2021 0000  ?    ?Component Value Date/Time  ? CALCIUM 8.5 (A) 06/11/2021 0000  ? ALKPHOS 70 06/11/2021 0000  ? AST 27 06/11/2021 0000  ? ALT 29 06/11/2021 0000  ?  ? ? ? ?RADIOGRAPHIC STUDIES: ?I have personally reviewed the radiological images as listed and agreed with the findings in the report. ?No results found. ?

## 2021-06-11 NOTE — Assessment & Plan Note (Signed)
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 ?She wanted to space out her treatments, but we recommended she continue IVIG every 4 weeks through the winter months. ?

## 2021-06-12 ENCOUNTER — Inpatient Hospital Stay: Payer: Medicare Other

## 2021-06-12 VITALS — BP 135/62 | HR 73 | Temp 98.2°F | Resp 18 | Ht 61.58 in | Wt 221.8 lb

## 2021-06-12 DIAGNOSIS — D819 Combined immunodeficiency, unspecified: Secondary | ICD-10-CM | POA: Diagnosis not present

## 2021-06-12 DIAGNOSIS — D801 Nonfamilial hypogammaglobulinemia: Secondary | ICD-10-CM

## 2021-06-12 MED ORDER — HEPARIN SOD (PORK) LOCK FLUSH 100 UNIT/ML IV SOLN
500.0000 [IU] | Freq: Once | INTRAVENOUS | Status: AC | PRN
  Administered 2021-06-12: 500 [IU]

## 2021-06-12 MED ORDER — DIPHENHYDRAMINE HCL 50 MG/ML IJ SOLN: 50.0000 mg | Freq: Once | INTRAMUSCULAR | Status: DC | PRN

## 2021-06-12 MED ORDER — IMMUNE GLOBULIN (HUMAN) 10 GM/100ML IV SOLN
0.9000 g/kg | Freq: Once | INTRAVENOUS | Status: AC
  Administered 2021-06-12: 45 g via INTRAVENOUS
  Filled 2021-06-12: qty 450

## 2021-06-12 MED ORDER — DEXTROSE 5 % IV SOLN: Freq: Once | INTRAVENOUS | Status: AC

## 2021-06-12 MED ORDER — DIPHENHYDRAMINE HCL 50 MG/ML IJ SOLN
25.0000 mg | Freq: Once | INTRAMUSCULAR | Status: AC
  Administered 2021-06-12: 25 mg via INTRAVENOUS
  Filled 2021-06-12: qty 1

## 2021-06-12 MED ORDER — FAMOTIDINE IN NACL 20-0.9 MG/50ML-% IV SOLN: 20.0000 mg | Freq: Once | INTRAVENOUS | Status: DC | PRN

## 2021-06-12 MED ORDER — ALTEPLASE 2 MG IJ SOLR: 2.0000 mg | Freq: Once | INTRAMUSCULAR | Status: DC | PRN

## 2021-06-12 MED ORDER — METHYLPREDNISOLONE SODIUM SUCC 125 MG IJ SOLR: 125.0000 mg | Freq: Once | INTRAMUSCULAR | Status: DC | PRN

## 2021-06-12 MED ORDER — EPINEPHRINE 0.3 MG/0.3ML IJ SOAJ: 0.3000 mg | Freq: Once | INTRAMUSCULAR | Status: DC | PRN

## 2021-06-12 MED ORDER — ACETAMINOPHEN 325 MG PO TABS
650.0000 mg | ORAL_TABLET | Freq: Once | ORAL | Status: AC
  Administered 2021-06-12: 650 mg via ORAL
  Filled 2021-06-12: qty 2

## 2021-06-12 MED ORDER — SODIUM CHLORIDE 0.9 % IV SOLN: Freq: Once | INTRAVENOUS | Status: DC | PRN

## 2021-06-12 MED ORDER — ALBUTEROL SULFATE (2.5 MG/3ML) 0.083% IN NEBU: 2.5000 mg | INHALATION_SOLUTION | Freq: Once | RESPIRATORY_TRACT | Status: DC | PRN

## 2021-06-12 MED ORDER — SODIUM CHLORIDE 0.9% FLUSH
10.0000 mL | Freq: Once | INTRAVENOUS | Status: AC | PRN
  Administered 2021-06-12: 10 mL

## 2021-06-12 NOTE — Patient Instructions (Signed)

## 2021-06-12 NOTE — Progress Notes (Signed)
1250:PT STABLE AT TIME OF DISCHARGE ?

## 2021-07-06 ENCOUNTER — Encounter: Payer: Self-pay | Admitting: Oncology

## 2021-07-06 MED FILL — Immune Globulin (Human) IV Soln 10 GM/100ML: INTRAVENOUS | Qty: 450 | Status: AC

## 2021-07-07 NOTE — Progress Notes (Signed)
?[facility]  ?[address] ?[place],  [place]  [address] ?[phone] ? ?Clinic Day:  07/09/21 ? ?Referring physician: [name], MD ? ?ASSESSMENT & PLAN:  ? 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  She did not require treatment until 2008 and has since been through multiple therapies.  She is currently taking venetoclax 400 mg daily with good control of her disease. Her blood counts remain stable.  ? ?Combined immunodeficiency disorder (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  She wanted to space out her treatments, but we recommended she continue IVIG every 4 weeks through the winter months. ? 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  ? ? ?She will proceed with IVIG tomorrow. She knows to continue venetoclax daily. We will plan to see her back in 4 weeks with a CBC and comprehensive metabolic panel prior to her next IVIG. The patient understands the plans discussed today and is in agreement with them.  She knows to contact our office if she develops concerns prior to her next appointment. ? ?I provided 15 minutes of face-to-face time during this this encounter and > 50% was spent counseling as documented under my assessment and plan.  ? ? ?[address] [address] ?[place] [address] ?Dept: [phone] ?Dept Fax: [phone]  ? ? ? ? ?CHIEF COMPLAINT:  ?CC: Chronic lymphocytic leukemia with associated immunodeficiency ? ?Current Treatment: Venetoclax 400 mg daily with IVIG every 4 weeks ? ? ?HISTORY OF PRESENT ILLNESS:  ?Samantha Warren is a 76 y.o. 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  She was on  observation until November 2008. She was initially treated with chlorambucil and prednisone, as she refused intravenous chemotherapy.  She had a partial response to this regimen.  By January 2010, she had progressive disease with a white count over 200,000, with associated anemia, splenomegaly, thrombocytopenia, and adenopathy.  She then received 5 cycles of fludarabine with good response.  She did well until January 2012, when she had progression once again.  She had a single dose of bendamustine and rituximab, which kept her disease under control for over a year.  However, she had a severe allergic reaction to the rituximab, so has not received further rituximab.  She was hospitalized after the bendamustine because of severe pancytopenia and required multiple transfusions, so was placed on observation.  In March 2013, she had progression of disease again, so was treated with bendamustine for 6 cycles, at a 50% dose reduction, and once again had a excellent response.  She was on observation until August 2014, then had progression of disease, so was placed on ibrutinib 420 mg daily.  The ibrutinib had kept her disease under fairly good control, but then she develops severe bilateral lower extremity cellulitis in April 2016, so ibrutinib was placed on hold.  She had persistent cellulitis, as well as  Clostridium difficile colitis in May 2016 requiring hospitalization.  She was readmitted soon after discharge with worsening cellulitis, requiring a prolonged hospitalization.  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 She eventually had resolution of the cellulitis ?  ?As she was largely asymptomatic, we continued  observation until February 2017, at which time she had worsening splenomegaly.  Repeat CT imaging in February showed progressive disease, with marked splenomegaly and bulky lymphadenopathy.  She was then placed back on  ibrutinib $RemoveBe'420mg'rVqIitdnq$  daily.  The lymphocytosis, anemia and thrombocytopenia slowly improved.  Due to neutropenia, ibrutinib was placed on hold in December 2017.  The ibrutinib was resumed in January 2018, but discontinued in August 2018 due to toxicities, mainly severe abdominal muscle cramping.  She was hospitalized in early September 2018 with urinary tract infection and pancytopenia.  She received multiple red blood cell and platelet transfusions during her stay.  She was admitted again in late September 2018 with perineal cellulitis extending to the lower abdomen and upper thigh, as well as persistent pancytopenia.  CT abdomen and pelvis revealed interval decrease in the abdominal lymphadenopathy, chronic massive splenomegaly with scattered small splenic infarcts, and small left pleural effusion with bibasilar atelectasis.  She required packed red blood cells while hospitalized.  She was discharged home with home health and followed up with the [facility].  We continued to follow her closely, but did not place her on a treatment due to the persistent cellulitis.  She then was admitted in October 2018 with severe hypercalcemia, with a calcium of 15.  She was treated with IV fluids, zoledronic acid and Calcitonin injections.  We continued to monitor her closely.  She had recurrent hypercalcemia, for which she received IV fluids and zolendronic acid as an outpatient.   ?  ?Due to worsening pancytopenia, she underwent bone marrow biopsy in January 2019.   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  The cellulitis had improved to a point we felt she could be treated again, so she was placed on venetoclax CLL in January 2019.  She has continued on venetoclax 400 mg daily after ramp up dosing and has tolerated that fairly well, except for mild diarrhea.  Since starting venetoclax, she has had decreasing splenomegaly and improvement in her  pancytopenia.  She initially continued to require IV fluids and zoledronic acid for the hypercalcemia.  Her last dose of zoledronic acid was given in March 2019.  In March, she had an irregular heart rhythm and EKG revealed occasional premature atrial complexes, as well as an incomplete right bundle branch block.  This was stable compared to EKG done in October 2018.  Quantitative immunoglobulins done in August 2019 revealed a normal IgG, with low IgA and IgM.  She has continued IVIG monthly.  She had an episode of Campylobacter diarrhea in November 2019, which was treated with azithromycin for 7 days with resolution of her diarrhea. Venetoclax was held temporarily until her diarrhea resolved.  She has since tolerated venetoclax without significant difficulty.   ?  ?She completed a full 2 years of monotherapy with venetoclax in January 2021.  We recommended continuing venetoclax 400 mg daily until progression of disease or unacceptable toxicities.  Annual bilateral mammogram in Apri 2021 revealed a possible mass in the left breast, which warranted further evaluation. Right breast did not reveal any evidence of malignancy.  Unilateral diagnostic left mammogram in May, revealed benign fibrocystic change within the outer left breast with no evidence of malignancy.  Routine annual bilateral mammography was recommended.   ? ?In February 2022, she had a right diagnostic mammogram due to right breast subareolar tenderness, which did not reveal any evidence of malignancy.  CT abdomen in April did not reveal any acute abnormality or other  findings to explain the left-sided abdominal pain.  There was resolution of previously seen gross splenomegaly.  There were prominent retroperitoneal lymph nodes, which had significantly diminished in size compared to prior exam.  A hiatal hernia was seen. There was descending colonic diverticulosis without evidence of acute diverticulitis.  Bilateral screening mammogram in May 2022  revealed possible asymmetry in the left breast.  Left diagnostic mammogram and ultrasound in June revealed a cluster of cysts/apocrine cyst in the left breast at 2 o'clock, 3 cm the nipple, measuring 6 x 3 x 6 mm, unchanged from the ex

## 2021-07-09 ENCOUNTER — Inpatient Hospital Stay: Payer: Medicare Other

## 2021-07-09 ENCOUNTER — Other Ambulatory Visit: Payer: Self-pay | Admitting: Oncology

## 2021-07-09 ENCOUNTER — Encounter: Payer: Self-pay | Admitting: Oncology

## 2021-07-09 ENCOUNTER — Inpatient Hospital Stay: Payer: Medicare Other | Attending: Hematology and Oncology | Admitting: Oncology

## 2021-07-09 VITALS — BP 155/70 | HR 78 | Temp 97.6°F | Resp 18 | Ht 61.5 in | Wt 221.8 lb

## 2021-07-09 VITALS — BP 116/42 | HR 77 | Temp 97.9°F | Resp 18 | Ht 61.5 in | Wt 221.8 lb

## 2021-07-09 DIAGNOSIS — D801 Nonfamilial hypogammaglobulinemia: Secondary | ICD-10-CM

## 2021-07-09 DIAGNOSIS — D61818 Other pancytopenia: Secondary | ICD-10-CM

## 2021-07-09 DIAGNOSIS — C9110 Chronic lymphocytic leukemia of B-cell type not having achieved remission: Secondary | ICD-10-CM

## 2021-07-09 DIAGNOSIS — D819 Combined immunodeficiency, unspecified: Secondary | ICD-10-CM | POA: Diagnosis present

## 2021-07-09 LAB — HEPATIC FUNCTION PANEL
ALT: 28 U/L (ref 7–35)
AST: 33 (ref 13–35)
Alkaline Phosphatase: 65 (ref 25–125)
Bilirubin, Total: 0.7

## 2021-07-09 LAB — COMPREHENSIVE METABOLIC PANEL
Albumin: 4.2 (ref 3.5–5.0)
Calcium: 9.3 (ref 8.7–10.7)

## 2021-07-09 LAB — CBC AND DIFFERENTIAL
HCT: 36 (ref 36–46)
Hemoglobin: 11.5 — AB (ref 12.0–16.0)
Neutrophils Absolute: 1.35
Platelets: 63 10*3/uL — AB (ref 150–400)
WBC: 3.3

## 2021-07-09 LAB — CBC: RBC: 3.91 (ref 3.87–5.11)

## 2021-07-09 LAB — BASIC METABOLIC PANEL
BUN: 24 — AB (ref 4–21)
CO2: 25 — AB (ref 13–22)
Chloride: 106 (ref 99–108)
Creatinine: 0.9 (ref 0.5–1.1)
Glucose: 86
Potassium: 4.4 mEq/L (ref 3.5–5.1)
Sodium: 140 (ref 137–147)

## 2021-07-09 MED ORDER — HEPARIN SOD (PORK) LOCK FLUSH 100 UNIT/ML IV SOLN
500.0000 [IU] | Freq: Once | INTRAVENOUS | Status: AC | PRN
  Administered 2021-07-09: 500 [IU]

## 2021-07-09 MED ORDER — DIPHENHYDRAMINE HCL 50 MG/ML IJ SOLN
25.0000 mg | Freq: Once | INTRAMUSCULAR | Status: AC
  Administered 2021-07-09: 25 mg via INTRAVENOUS
  Filled 2021-07-09: qty 1

## 2021-07-09 MED ORDER — SODIUM CHLORIDE 0.9% FLUSH
10.0000 mL | Freq: Once | INTRAVENOUS | Status: AC | PRN
  Administered 2021-07-09: 10 mL

## 2021-07-09 MED ORDER — SODIUM CHLORIDE 0.9% FLUSH
10.0000 mL | INTRAVENOUS | Status: DC | PRN
  Administered 2021-07-09: 10 mL

## 2021-07-09 MED ORDER — DEXTROSE 5 % IV SOLN: Freq: Once | INTRAVENOUS | Status: AC

## 2021-07-09 MED ORDER — IMMUNE GLOBULIN (HUMAN) 10 GM/100ML IV SOLN
0.9000 g/kg | Freq: Once | INTRAVENOUS | Status: AC
  Administered 2021-07-09: 45 g via INTRAVENOUS
  Filled 2021-07-09: qty 450

## 2021-07-09 MED ORDER — ACETAMINOPHEN 325 MG PO TABS
650.0000 mg | ORAL_TABLET | Freq: Once | ORAL | Status: AC
  Administered 2021-07-09: 650 mg via ORAL
  Filled 2021-07-09: qty 2

## 2021-07-09 NOTE — Patient Instructions (Signed)
[facility]  Discharge Instructions: ?Thank you for choosing [facility] to provide your oncology and hematology care.  ?If you have a lab appointment with the [facility], please go directly to the [facility] and check in at the registration area. ?  ?Wear comfortable clothing and clothing appropriate for easy access to any Portacath or PICC line.  ? ?We strive to give you quality time with your provider. You may need to reschedule your appointment if you arrive late (15 or more minutes).  Arriving late affects you and other patients whose appointments are after yours.  Also, if you miss three or more appointments without notifying the office, you may be dismissed from the clinic at the provider?s discretion.    ?  ?For prescription refill requests, have your pharmacy contact our office and allow 72 hours for refills to be completed.   ? ?Today you received the following chemotherapy and/or immunotherapy agents Immunoglobulin    ?  ?To help prevent nausea and vomiting after your treatment, we encourage you to take your nausea medication as directed. ? ?BELOW ARE SYMPTOMS THAT SHOULD BE REPORTED IMMEDIATELY: ?*FEVER GREATER THAN 100.4 F (38 ?C) OR HIGHER ?*CHILLS OR SWEATING ?*NAUSEA AND VOMITING THAT IS NOT CONTROLLED WITH YOUR NAUSEA MEDICATION ?*UNUSUAL SHORTNESS OF BREATH ?*UNUSUAL BRUISING OR BLEEDING ?*URINARY PROBLEMS (pain or burning when urinating, or frequent urination) ?*BOWEL PROBLEMS (unusual diarrhea, constipation, pain near the anus) ?TENDERNESS IN MOUTH AND THROAT WITH OR WITHOUT PRESENCE OF ULCERS (sore throat, sores in mouth, or a toothache) ?UNUSUAL RASH, SWELLING OR PAIN  ?UNUSUAL VAGINAL DISCHARGE OR ITCHING  ? ?Items with * indicate a potential emergency and should be followed up as soon as possible or go to the Emergency Department if any problems should occur. ? ?Please show the CHEMOTHERAPY ALERT CARD or IMMUNOTHERAPY ALERT CARD at check-in to the  Emergency Department and triage nurse. ? ?Should you have questions after your visit or need to cancel or reschedule your appointment, please contact [facility]  Dept: [phone]  and follow the prompts.  Office hours are 8:00 a.m. to 4:30 p.m. Monday - Friday. Please note that voicemails left after 4:00 p.m. may not be returned until the following business day.  We are closed weekends and major holidays. You have access to a nurse at all times for urgent questions. Please call the main number to the clinic Dept: [phone] and follow the prompts. ? ?For any non-urgent questions, you may also contact your provider using MyChart. We now offer e-Visits for anyone 51 and older to request care online for non-urgent symptoms. For details visit [email or web address]. ?  ?Also download the MyChart app! Go to the app store, search "MyChart", open the app, select [facility], and log in with your MyChart username and password. ? ?Due to Covid, a mask is required upon entering the hospital/clinic. If you do not have a mask, one will be given to you upon arrival. For doctor visits, patients may have 1 support person aged 68 or older with them. For treatment visits, patients cannot have anyone with them due to current Covid guidelines and our immunocompromised population.  ? ? ?

## 2021-07-10 ENCOUNTER — Ambulatory Visit: Payer: Medicare Other

## 2021-07-12 ENCOUNTER — Telehealth: Payer: Self-pay

## 2021-07-12 ENCOUNTER — Other Ambulatory Visit: Payer: Self-pay

## 2021-07-12 ENCOUNTER — Observation Stay (HOSPITAL_COMMUNITY)
Admission: EM | Admit: 2021-07-12 | Discharge: 2021-07-13 | Disposition: A | Discharge status: Home / Self Care | Payer: Medicare Other | Attending: Internal Medicine | Admitting: Internal Medicine

## 2021-07-12 ENCOUNTER — Encounter (HOSPITAL_COMMUNITY): Payer: Self-pay

## 2021-07-12 ENCOUNTER — Emergency Department (HOSPITAL_COMMUNITY): Payer: Medicare Other

## 2021-07-12 DIAGNOSIS — F17210 Nicotine dependence, cigarettes, uncomplicated: Secondary | ICD-10-CM | POA: Diagnosis not present

## 2021-07-12 DIAGNOSIS — C9110 Chronic lymphocytic leukemia of B-cell type not having achieved remission: Secondary | ICD-10-CM | POA: Diagnosis not present

## 2021-07-12 DIAGNOSIS — L03211 Cellulitis of face: Secondary | ICD-10-CM | POA: Diagnosis not present

## 2021-07-12 DIAGNOSIS — R519 Headache, unspecified: Secondary | ICD-10-CM | POA: Diagnosis present

## 2021-07-12 DIAGNOSIS — N179 Acute kidney failure, unspecified: Secondary | ICD-10-CM | POA: Diagnosis not present

## 2021-07-12 DIAGNOSIS — Z96659 Presence of unspecified artificial knee joint: Secondary | ICD-10-CM | POA: Insufficient documentation

## 2021-07-12 DIAGNOSIS — Z79899 Other long term (current) drug therapy: Secondary | ICD-10-CM | POA: Insufficient documentation

## 2021-07-12 DIAGNOSIS — I10 Essential (primary) hypertension: Secondary | ICD-10-CM | POA: Diagnosis present

## 2021-07-12 DIAGNOSIS — D696 Thrombocytopenia, unspecified: Secondary | ICD-10-CM | POA: Diagnosis present

## 2021-07-12 DIAGNOSIS — D819 Combined immunodeficiency, unspecified: Secondary | ICD-10-CM | POA: Diagnosis present

## 2021-07-12 DIAGNOSIS — K0889 Other specified disorders of teeth and supporting structures: Secondary | ICD-10-CM | POA: Insufficient documentation

## 2021-07-12 LAB — CBC WITH DIFFERENTIAL/PLATELET
Abs Immature Granulocytes: 0.01 10*3/uL (ref 0.00–0.07)
Basophils Absolute: 0 10*3/uL (ref 0.0–0.1)
Basophils Relative: 0 %
Eosinophils Absolute: 0 10*3/uL (ref 0.0–0.5)
Eosinophils Relative: 0 %
HCT: 38.9 % (ref 36.0–46.0)
Hemoglobin: 12.7 g/dL (ref 12.0–15.0)
Immature Granulocytes: 0 %
Lymphocytes Relative: 26 %
Lymphs Abs: 1.5 10*3/uL (ref 0.7–4.0)
MCH: 31 pg (ref 26.0–34.0)
MCHC: 32.6 g/dL (ref 30.0–36.0)
MCV: 94.9 fL (ref 80.0–100.0)
Monocytes Absolute: 1 10*3/uL (ref 0.1–1.0)
Monocytes Relative: 17 %
Neutro Abs: 3.4 10*3/uL (ref 1.7–7.7)
Neutrophils Relative %: 57 %
Platelets: 74 10*3/uL — ABNORMAL LOW (ref 150–400)
RBC: 4.1 MIL/uL (ref 3.87–5.11)
RDW: 14.9 % (ref 11.5–15.5)
WBC: 5.9 10*3/uL (ref 4.0–10.5)
nRBC: 0 % (ref 0.0–0.2)

## 2021-07-12 LAB — COMPREHENSIVE METABOLIC PANEL
ALT: 20 U/L (ref 0–44)
AST: 21 U/L (ref 15–41)
Albumin: 3.8 g/dL (ref 3.5–5.0)
Alkaline Phosphatase: 55 U/L (ref 38–126)
Anion gap: 10 (ref 5–15)
BUN: 23 mg/dL (ref 8–23)
CO2: 23 mmol/L (ref 22–32)
Calcium: 8.8 mg/dL — ABNORMAL LOW (ref 8.9–10.3)
Chloride: 104 mmol/L (ref 98–111)
Creatinine, Ser: 2.15 mg/dL — ABNORMAL HIGH (ref 0.44–1.00)
GFR, Estimated: 24 mL/min — ABNORMAL LOW (ref >60)
Glucose, Bld: 143 mg/dL — ABNORMAL HIGH (ref 70–99)
Potassium: 3.8 mmol/L (ref 3.5–5.1)
Sodium: 137 mmol/L (ref 135–145)
Total Bilirubin: 0.8 mg/dL (ref 0.3–1.2)
Total Protein: 8.1 g/dL (ref 6.5–8.1)

## 2021-07-12 LAB — LACTIC ACID, PLASMA
Lactic Acid, Venous: 1.4 mmol/L (ref 0.5–1.9)
Lactic Acid, Venous: 1.5 mmol/L (ref 0.5–1.9)

## 2021-07-12 MED ORDER — CHLORHEXIDINE GLUCONATE CLOTH 2 % EX PADS: 6.0000 | MEDICATED_PAD | Freq: Every day | CUTANEOUS | Status: DC

## 2021-07-12 MED ORDER — SODIUM CHLORIDE 0.9 % IV BOLUS
1000.0000 mL | Freq: Once | INTRAVENOUS | Status: AC
  Administered 2021-07-12: 1000 mL via INTRAVENOUS

## 2021-07-12 MED ORDER — SODIUM CHLORIDE 0.9 % IV SOLN
3.0000 g | Freq: Four times a day (QID) | INTRAVENOUS | Status: DC
  Filled 2021-07-12 (×3): qty 8

## 2021-07-12 MED ORDER — SODIUM CHLORIDE 0.9% FLUSH: 10.0000 mL | INTRAVENOUS | Status: DC | PRN

## 2021-07-12 MED ORDER — OXYCODONE-ACETAMINOPHEN 5-325 MG PO TABS
1.0000 | ORAL_TABLET | Freq: Once | ORAL | Status: AC
  Administered 2021-07-12: 1 via ORAL
  Filled 2021-07-12: qty 1

## 2021-07-12 MED ORDER — SODIUM CHLORIDE 0.9 % IV SOLN
3.0000 g | Freq: Two times a day (BID) | INTRAVENOUS | Status: DC
  Administered 2021-07-12 – 2021-07-13 (×2): 3 g via INTRAVENOUS
  Filled 2021-07-12 (×4): qty 8

## 2021-07-12 NOTE — ED Notes (Signed)
Patient transported to CT 

## 2021-07-12 NOTE — ED Triage Notes (Signed)
Pt reports she was sent here by her dentist to receive "IV abx, chemistry monitoring, surgical ext of tooth #29 with exploration and drainage of head and neck spaces" due to long standing immune suppression (she has Leukemia, CLL). She presents with swelling to right side of jaw and cheek. ?

## 2021-07-12 NOTE — ED Notes (Signed)
Port unsuccessful with blood return, however, flushes great. Phlebotomy will come stick for blood cultures.  ?

## 2021-07-12 NOTE — ED Provider Notes (Signed)
?[facility] ?Provider Note ? ? ?CSN: [id number] ?Arrival date & time: 07/12/21  1346 ? ?  ? ?History ? ?Chief Complaint  ?Patient presents with  ? Dental Pain  ? ? ?Samantha [place] Rosalea Withrow is a 76 y.o. female.  Presented to the emergency department with concern for dental pain.  Patient reports over the last few days she has noted some pain and swelling on the right side of her face and jaw.  States that she was started on oral penicillin.  Has not noted significant improvement.  Went to Chief Financial Officer, Dr. Janelle Floor.  She was then advised to come to ER for IV antibiotics and admission.  Patient states her pain is currently mild.  Has no difficulty in swallowing, no difficulty in breathing. ? ?HPI ? ?  ? ?Home Medications ?Prior to Admission medications   ?Medication Sig Start Date End Date Taking? Authorizing Provider  ?alendronate (FOSAMAX) 70 MG tablet  04/03/16  Yes [provider]  ?amLODipine (NORVASC) 2.5 MG tablet Take 2.5 mg by mouth daily. 02/20/21  Yes [provider]  ?amoxicillin (AMOXIL) 500 MG capsule Take 1,000 mg by mouth 2 (two) times daily. 07/11/21  Yes [provider]  ?Ascorbic Acid (VITAMIN C WITH ROSE HIPS) 500 MG tablet Take 500 mg by mouth daily.   Yes [provider]  ?benzonatate (TESSALON) 100 MG capsule TAKE ONE CAPSULE BY MOUTH 3 TIMES A DAY AS NEEDED 11/08/20  Yes Derwood Kaplan, MD  ?calcium carbonate (OSCAL) 1500 (600 Ca) MG TABS tablet Take by mouth.   Yes [provider]  ?Cholecalciferol (VITAMIN D3) 125 MCG (5000 UT) TABS Take 1 tablet by mouth daily.   Yes [provider]  ?diclofenac Sodium (VOLTAREN) 1 % GEL Apply topically. 02/29/20  Yes [provider]  ?KLOR-CON M20 20 MEQ tablet  04/07/16  Yes [provider]  ?lidocaine (LIDODERM) 5 % 2 patches daily. 02/01/20  Yes [provider]  ?lidocaine-prilocaine (EMLA) cream Apply 1 application topically as needed.  07/03/20  Yes Mosher, Thalia Bloodgood, PA-C  ?Multiple Vitamin (MULTIVITAMIN WITH MINERALS) TABS tablet Take 1 tablet by mouth daily.   Yes [provider]  ?Omega-3 Fatty Acids (FISH OIL) 1000 MG CAPS Take 1 capsule by mouth daily.   Yes [provider]  ?omeprazole (PRILOSEC) 20 MG capsule TAKE 1 CAPSULE BY MOUTH EVERY DAY 05/31/21  Yes Derwood Kaplan, MD  ?oxyCODONE (ROXICODONE) 15 MG immediate release tablet Take 15 mg by mouth every 6 (six) hours as needed. 01/08/20  Yes [provider]  ?penicillin v potassium (VEETID) 500 MG tablet Take 500 mg by mouth 4 (four) times daily. 07/11/21  Yes [provider]  ?prochlorperazine (COMPAZINE) 10 MG tablet TAKE 1 TABLET BY MOUTH EVERY 6 HOURS AS NEEDED FOR NAUSEA OR VOMITING. 05/10/21  Yes Mosher, Vida Roller A, PA-C  ?VENCLEXTA 100 MG tablet Take 4 tablets (400 mg total) by mouth daily. 05/15/21  Yes Dayton Scrape A, NP  ?vitamin B-12 (CYANOCOBALAMIN) 100 MCG tablet Take 100 mcg by mouth daily.   Yes [provider]  ?NEOMYCIN-POLYMYXIN-HYDROCORTISONE (CORTISPORIN) 1 % SOLN otic solution  04/22/16   [provider]  ?Vibegron (GEMTESA) 75 MG TABS Take 75 mg by mouth at bedtime.  ?Patient not taking: Reported on 07/12/2021    [provider]  ?   ? ?Allergies    ?Metronidazole, Rituximab, and Sulfa antibiotics   ? ?Review of Systems   ?Review of Systems  ?  HENT:  Positive for dental problem.   ?All other systems reviewed and are negative. ? ?Physical Exam ?Updated Vital Signs ?BP (!) 112/51   Pulse 83   Temp 98.8 ?F (37.1 ?C) (Oral)   Resp 16   SpO2 97%  ?Physical Exam ?Vitals and nursing note reviewed.  ?Constitutional:   ?   General: She is not in acute distress. ?   Appearance: She is well-developed.  ?HENT:  ?   Head: Normocephalic and atraumatic.  ?   Comments: There is some mild facial swelling and erythema to the right mandibular region ?   Mouth/Throat:  ?   Comments: Extensive dental caries in mouth, there is  tenderness to palpation in right lower molar region ?Eyes:  ?   Conjunctiva/sclera: Conjunctivae normal.  ?Cardiovascular:  ?   Rate and Rhythm: Normal rate and regular rhythm.  ?   Heart sounds: No murmur heard. ?Pulmonary:  ?   Effort: Pulmonary effort is normal. No respiratory distress.  ?   Breath sounds: Normal breath sounds.  ?Abdominal:  ?   Palpations: Abdomen is soft.  ?   Tenderness: There is no abdominal tenderness.  ?Musculoskeletal:     ?   General: No swelling.  ?   Cervical back: Neck supple.  ?Skin: ?   General: Skin is warm and dry.  ?   Capillary Refill: Capillary refill takes less than 2 seconds.  ?Neurological:  ?   Mental Status: She is alert.  ?Psychiatric:     ?   Mood and Affect: Mood normal.  ? ? ?ED Results / Procedures / Treatments   ?Labs ?(all labs ordered are listed, but only abnormal results are displayed) ?Labs Reviewed  ?CBC WITH DIFFERENTIAL/PLATELET - Abnormal; Notable for the following components:  ?    Result Value  ? Platelets 74 (*)   ? All other components within normal limits  ?COMPREHENSIVE METABOLIC PANEL - Abnormal; Notable for the following components:  ? Glucose, Bld 143 (*)   ? Creatinine, Ser 2.15 (*)   ? Calcium 8.8 (*)   ? GFR, Estimated 24 (*)   ? All other components within normal limits  ?CULTURE, BLOOD (ROUTINE X 2)  ?CULTURE, BLOOD (ROUTINE X 2)  ?LACTIC ACID, PLASMA  ?LACTIC ACID, PLASMA  ? ? ?EKG ?None ? ?Radiology ?CT MAXILLOFACIAL WO CONTRAST ? ?Result Date: 07/12/2021 ?CLINICAL DATA:  Right dental pain EXAM: CT MAXILLOFACIAL WITHOUT CONTRAST TECHNIQUE: Multidetector CT imaging of the maxillofacial structures was performed. Multiplanar CT image reconstructions were also generated. RADIATION DOSE REDUCTION: This exam was performed according to the departmental dose-optimization program which includes automated exposure control, adjustment of the mA and/or kV according to patient size and/or use of iterative reconstruction technique. COMPARISON:  None.  FINDINGS: Streak artifact from jewelry patient declined to remove. Streak artifact from dental amalgam. Osseous: No acute abnormality identified. Orbits: Unremarkable. Sinuses: Mild mucosal thickening. Soft tissues: Suspect asymmetric tissue thickening the anterior aspect of the right mandible. No abscess identified. Limited intracranial: No acute abnormality. IMPRESSION: Evaluation limited by significant streak artifact. Suspect inflammatory changes along the anterior right mandible. No abscess. Electronically Signed   By: Macy Mis M.D.   On: 07/12/2021 21:20   ? ?Procedures ?Procedures  ? ? ?Medications Ordered in ED ?Medications  ?Ampicillin-Sulbactam (UNASYN) 3 g in sodium chloride 0.9 % 100 mL IVPB (0 g Intravenous Stopped 07/12/21 2210)  ?sodium chloride flush (NS) 0.9 % injection 10-40 mL (has no administration in time range)  ?Chlorhexidine Gluconate  Cloth 2 % PADS 6 each (6 each Topical Not Given 07/12/21 2200)  ?sodium chloride 0.9 % bolus 1,000 mL (1,000 mLs Intravenous New Bag/Given 07/12/21 2131)  ?oxyCODONE-acetaminophen (PERCOCET/ROXICET) 5-325 MG per tablet 1 tablet (1 tablet Oral Given 07/12/21 2317)  ? ? ?ED Course/ Medical Decision Making/ A&P ?  ?                        ?Medical Decision Making ?Risk ?OTC drugs. ?Prescription drug management. ?Decision regarding hospitalization. ? ? ?75 year old lady presenting to the emergency room with concern for right facial swelling, redness and dental pain.  Apparently had been on some oral antibiotics and seen by oral surgeon office today and was advised to come to ER for IV antibiotics.  On physical exam she does have some mild to moderate swelling and redness to her right face in the mandibular region.  Extensive dental caries on exam but no discrete abscess seen or palpated.  Her basic labs are stable, no leukocytosis.  Her CT scan did not show any discrete abscess.  I reviewed the CT images myself.  Suspect cellulitis at this point.  Given symptoms  or not improving on the oral antibiotics and the recommendation from oral surgery, will admit to the hospitalist service for further management.  I discussed with Dr. Posey Pronto who will accept. ? ?I attempted to call the

## 2021-07-12 NOTE — Telephone Encounter (Signed)
Tyrone took pt to [facility] ED as was told it would be approx a 17 hour wait until Sanaiya was seen by MD.  He wants to bring her back to [place] so  she would be closer to home.  I told him that was an option but they would have to go through the same process here.  He understood that. ? ?

## 2021-07-12 NOTE — ED Provider Triage Note (Addendum)
Emergency Medicine Provider Triage Evaluation Note ? ?Samantha [place] , a 76 y.o. female  was evaluated in triage.  Pt complains of right sided dental pain with swelling. The patient has been on oral medications without relief. The patient was sent over here from her oral surgeons' office for IV antibiotics, imaging, and surgical extractions. ? ?Review of Systems  ?Positive:  ?Negative:  ? ?Physical Exam  ?BP 126/69 (BP Location: Left Arm)   Pulse (!) 108   Temp 98.8 ?F (37.1 ?C) (Oral)   Resp 16   SpO2 99%  ?Gen:   Awake, no distress   ?Resp:  Normal effort  ?MSK:   Moves extremities without difficulty  ?Other:  Swelling, erythema with overlying warmth noted to the right mandible. Poor dentition. Some trismus noted. The patient is tender to palpation of the lower dentition. ? ?Medical Decision Making  ?Medically screening exam initiated at 4:13 PM.  Appropriate orders placed.  Samantha [place] was informed that the remainder of the evaluation will be completed by another provider, this initial triage assessment does not replace that evaluation, and the importance of remaining in the ED until their evaluation is complete. ? ?Contacted the oral surgery office she was sent to to discuss that we do not have an oral surgeon on call. They recommended IV antibiotics and imaging to get the infection under control and that they could potentially see her in clinic for extraction.  ? ?Spoke with ED pharmacist, will start on Unasyn.  ?  ?Sherrell Puller, PA-C ?07/12/21 1618 ? ?  ?Sherrell Puller, PA-C ?07/12/21 1647 ? ?

## 2021-07-12 NOTE — H&P (Signed)
?History and Physical  ? ? ?Samantha Warren [place] BMW:[id number] DOB: 03-Apr-1945 DOA: 07/12/2021 ? ?PCP: [name], MD  ?Patient coming from: Home ? ?I have personally briefly reviewed patient's old medical records in [place] ? ?Chief Complaint: Right lower tooth pain with jaw swelling ? ?HPI: ?Samantha Warren is a 76 y.o. 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 ? ?Patient reports 3 days of right lower tooth pain interfering with her ability to eat.  She saw her dentist who prescribed her oral penicillin which she has been taking.  She had been having persistent pain and had been taking Advil up to every 4 hours.  She noticed some swelling to her right lower jaw area.  She saw her oral surgeon on 4/13 who after evaluation advised her to come to the ED for IV antibiotics. ? ?At time of my evaluation, patient states that tooth pain is improving and she is able to eat some crackers.  She has not had any difficulty swallowing.  She has continued tenderness and a little swelling at her right lower jaw area.  She denies any fevers, chills, diaphoresis. ? ?ED Course  Labs/Imaging on admission: I have personally reviewed following labs and imaging studies. ? ?Initial vitals showed BP 126/69, pulse 108, RR 16, temp 98.8 ?F, SPO2 99% on room air. ? ?Labs show WBC 5.9, hemoglobin 12.7, platelets 74,000, sodium 137, potassium 3.8, bicarb 23, BUN 23, creatinine 2.15 (previously 0.9 on 07/09/2021), serum glucose 143, LFTs within normal limits, lactic acid 1.4.  Blood cultures in process. ? ?CT maxillofacial without contrast limited due to significant streak artifact.  Inflammatory changes along the anterior right mandible seen without  evidence of abscess. ? ?Patient was given 1 L normal saline and started on IV Unasyn.  The hospitalist service was consulted to admit for further evaluation and management. ? ?Review of Systems: All systems reviewed and are negative except as documented in history of present illness above. ? ? ?Past Medical History:  ?Diagnosis Date  ? Cancer [facility])   ? ? ?Past Surgical History:  ?Procedure Laterality Date  ? arm surgery    ? GALLBLADDER SURGERY    ? TOTAL KNEE ARTHROPLASTY    ? ? ?Social History: ? reports that she has been smoking cigarettes. She has a 15.00 pack-year smoking history. She has never used smokeless tobacco. She reports current drug use. No history on file for alcohol use. ? ?Allergies  ?Allergen Reactions  ? Metronidazole   ? Rituximab   ? Sulfa Antibiotics Rash  ? ? ?Family History  ?Problem Relation Age of Onset  ? Cancer Mother   ? Hypertension Father   ? Hypertension Sister   ? Stroke Sister   ? Hypertension Brother   ? Stroke Brother   ? ? ? ?Prior to Admission medications   ?Medication Sig Start Date End Date Taking? Authorizing Provider  ?alendronate (FOSAMAX) 70 MG tablet  04/03/16  Yes [provider]  ?amLODipine (NORVASC) 2.5 MG tablet Take 2.5 mg by mouth daily. 02/20/21  Yes [provider]  ?amoxicillin (AMOXIL) 500 MG capsule Take 1,000 mg by mouth 2 (two) times daily. 07/11/21  Yes [provider]  ?Ascorbic Acid (VITAMIN C WITH ROSE HIPS) 500 MG tablet Take 500 mg by mouth  daily.   Yes [provider]  ?benzonatate (TESSALON) 100 MG capsule TAKE ONE CAPSULE BY MOUTH 3 TIMES A DAY AS NEEDED 11/08/20  Yes Derwood Kaplan, MD  ?calcium carbonate (OSCAL) 1500 (600 Ca) MG TABS tablet Take by mouth.   Yes [provider]  ?Cholecalciferol (VITAMIN D3) 125 MCG (5000 UT) TABS Take 1 tablet by mouth daily.   Yes [provider]  ?diclofenac Sodium (VOLTAREN) 1 % GEL Apply topically. 02/29/20  Yes [provider]  ?KLOR-CON M20 20  MEQ tablet  04/07/16  Yes [provider]  ?lidocaine (LIDODERM) 5 % 2 patches daily. 02/01/20  Yes [provider]  ?lidocaine-prilocaine (EMLA) cream Apply 1 application topically as needed. 07/03/20  Yes Mosher, Thalia Bloodgood, PA-C  ?Multiple Vitamin (MULTIVITAMIN WITH MINERALS) TABS tablet Take 1 tablet by mouth daily.   Yes [provider]  ?Omega-3 Fatty Acids (FISH OIL) 1000 MG CAPS Take 1 capsule by mouth daily.   Yes [provider]  ?omeprazole (PRILOSEC) 20 MG capsule TAKE 1 CAPSULE BY MOUTH EVERY DAY 05/31/21  Yes Derwood Kaplan, MD  ?oxyCODONE (ROXICODONE) 15 MG immediate release tablet Take 15 mg by mouth every 6 (six) hours as needed. 01/08/20  Yes [provider]  ?penicillin v potassium (VEETID) 500 MG tablet Take 500 mg by mouth 4 (four) times daily. 07/11/21  Yes [provider]  ?prochlorperazine (COMPAZINE) 10 MG tablet TAKE 1 TABLET BY MOUTH EVERY 6 HOURS AS NEEDED FOR NAUSEA OR VOMITING. 05/10/21  Yes Mosher, Vida Roller A, PA-C  ?VENCLEXTA 100 MG tablet Take 4 tablets (400 mg total) by mouth daily. 05/15/21  Yes Dayton Scrape A, NP  ?vitamin B-12 (CYANOCOBALAMIN) 100 MCG tablet Take 100 mcg by mouth daily.   Yes [provider]  ?NEOMYCIN-POLYMYXIN-HYDROCORTISONE (CORTISPORIN) 1 % SOLN otic solution  04/22/16   [provider]  ?Vibegron (GEMTESA) 75 MG TABS Take 75 mg by mouth at bedtime.  ?Patient not taking: Reported on 07/12/2021    [provider]  ? ? ?Physical Exam: ?Vitals:  ? 07/12/21 2130 07/12/21 2215 07/12/21 2245 07/12/21 2330  ?BP: (!) 117/59 (!) 107/55 (!) 112/51 94/80  ?Pulse: 97 91 83 94  ?Resp: '16 16 16   '$ ?Temp:      ?TempSrc:      ?SpO2: 98% 95% 97% 96%  ? ?Constitutional: Resting in bed, NAD, calm, comfortable ?Eyes: PERRL, lids and conjunctivae normal ?ENMT: Mucous membranes are dry.  Poor dentition with multiple caries and prior tooth fillings.  Swelling to right lower jaw area, tender to palpation with  increased warmth to touch.  No open wound or drainage. ?Neck: normal, supple, no masses. ?Respiratory: clear to auscultation bilaterally, no wheezing, no crackles. Normal respiratory effort. No accessory muscle use.  ?Cardiovascular: Regular rate and rhythm, no murmurs / rubs / gallops. No extremity edema. 2+ pedal pulses.  Port-A-Cath in place right chest wall. ?Abdomen: no tenderness, no masses palpated. No hepatosplenomegaly. Bowel sounds positive.  ?Musculoskeletal: no clubbing / cyanosis. No joint deformity upper and lower extremities. Good ROM, no contractures. Normal muscle tone.  ?Skin: Swelling to right lower jaw area, increased warmth and tender to touch ?Neurologic: CN 2-12 grossly intact. Sensation intact. Strength 5/5 in all 4.  ?Psychiatric: Alert and oriented x 3. Normal mood.  ? ?EKG: Not performed. ? ?Assessment/Plan ?Principal Problem: ?  Right facial cellulitis ?Active Problems: ?  AKI (acute kidney injury) ([place]) ?  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  Combined immunodeficiency disorder ([place]) ?  Thrombocytopenia ([place]) ?  Essential hypertension ?  ?[place] Breeding Sharolyn Weber is a 76 y.o. 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  Started on IV Unasyn. ? ?Assessment and Plan: ?* Right facial cellulitis ?Right lower facial cellulitis/swelling likely secondary to odontogenic source.  No compromise of airway.  CT maxillofacial without evidence of abscess. ?-Continue IV Unasyn and likely transition to oral Augmentin on discharge ?-Continue home oxycodone as needed for pain ? ?AKI (acute kidney injury) ([place]) ?Likely multifactorial related to poor oral intake and NSAID use. ?-Start on IV  fluid hydration overnight ?-Repeat labs in a.m. ?-Monitor urine output ?-Avoid NSAIDs ? ?Chronic lymphocytic leukemia of B-cell type not having achieved remission ([place]) ?Follows with oncology, Dr. Hinton Rao, on [place] [place]

## 2021-07-13 DIAGNOSIS — L03211 Cellulitis of face: Secondary | ICD-10-CM | POA: Diagnosis not present

## 2021-07-13 DIAGNOSIS — I10 Essential (primary) hypertension: Secondary | ICD-10-CM | POA: Diagnosis present

## 2021-07-13 LAB — BASIC METABOLIC PANEL
Anion gap: 5 (ref 5–15)
BUN: 17 mg/dL (ref 8–23)
CO2: 23 mmol/L (ref 22–32)
Calcium: 7.5 mg/dL — ABNORMAL LOW (ref 8.9–10.3)
Chloride: 112 mmol/L — ABNORMAL HIGH (ref 98–111)
Creatinine, Ser: 1 mg/dL (ref 0.44–1.00)
GFR, Estimated: 59 mL/min — ABNORMAL LOW (ref >60)
Glucose, Bld: 73 mg/dL (ref 70–99)
Potassium: 3.9 mmol/L (ref 3.5–5.1)
Sodium: 140 mmol/L (ref 135–145)

## 2021-07-13 LAB — CBC
HCT: 32.9 % — ABNORMAL LOW (ref 36.0–46.0)
Hemoglobin: 10.6 g/dL — ABNORMAL LOW (ref 12.0–15.0)
MCH: 30.6 pg (ref 26.0–34.0)
MCHC: 32.2 g/dL (ref 30.0–36.0)
MCV: 95.1 fL (ref 80.0–100.0)
Platelets: 64 10*3/uL — ABNORMAL LOW (ref 150–400)
RBC: 3.46 MIL/uL — ABNORMAL LOW (ref 3.87–5.11)
RDW: 14.7 % (ref 11.5–15.5)
WBC: 3.6 10*3/uL — ABNORMAL LOW (ref 4.0–10.5)
nRBC: 0 % (ref 0.0–0.2)

## 2021-07-13 MED ORDER — PANTOPRAZOLE SODIUM 40 MG PO TBEC
40.0000 mg | DELAYED_RELEASE_TABLET | Freq: Every day | ORAL | Status: DC
  Administered 2021-07-13: 40 mg via ORAL
  Filled 2021-07-13: qty 1

## 2021-07-13 MED ORDER — ONDANSETRON HCL 4 MG/2ML IJ SOLN
4.0000 mg | Freq: Four times a day (QID) | INTRAMUSCULAR | Status: DC | PRN
Start: 2021-07-13 — End: 2021-07-13

## 2021-07-13 MED ORDER — ONDANSETRON HCL 4 MG PO TABS: 4.0000 mg | ORAL_TABLET | Freq: Four times a day (QID) | ORAL | Status: DC | PRN

## 2021-07-13 MED ORDER — OXYCODONE HCL 5 MG PO TABS
15.0000 mg | ORAL_TABLET | Freq: Four times a day (QID) | ORAL | Status: DC | PRN
  Administered 2021-07-13: 15 mg via ORAL
  Filled 2021-07-13: qty 3

## 2021-07-13 MED ORDER — SENNOSIDES-DOCUSATE SODIUM 8.6-50 MG PO TABS: 1.0000 | ORAL_TABLET | Freq: Every evening | ORAL | Status: DC | PRN

## 2021-07-13 MED ORDER — HEPARIN SOD (PORK) LOCK FLUSH 100 UNIT/ML IV SOLN
500.0000 [IU] | INTRAVENOUS | Status: AC | PRN
  Administered 2021-07-13: 500 [IU]
  Filled 2021-07-13: qty 5

## 2021-07-13 MED ORDER — ACETAMINOPHEN 325 MG PO TABS: 650.0000 mg | ORAL_TABLET | Freq: Four times a day (QID) | ORAL | Status: DC | PRN

## 2021-07-13 MED ORDER — SODIUM CHLORIDE 0.9 % IV SOLN: INTRAVENOUS | Status: AC

## 2021-07-13 MED ORDER — ACETAMINOPHEN 650 MG RE SUPP: 650.0000 mg | Freq: Four times a day (QID) | RECTAL | Status: DC | PRN

## 2021-07-13 NOTE — Assessment & Plan Note (Signed)
Chronic and stable without obvious bleeding.  Likely related to CLL treatment. ?

## 2021-07-13 NOTE — Assessment & Plan Note (Signed)
Likely multifactorial related to poor oral intake and NSAID use. ?-Start on IV fluid hydration overnight ?-Repeat labs in a.m. ?-Monitor urine output ?-Avoid NSAIDs ?

## 2021-07-13 NOTE — Assessment & Plan Note (Signed)
Right lower facial cellulitis/swelling likely secondary to odontogenic source.  No compromise of airway.  CT maxillofacial without evidence of abscess. ?-Continue IV Unasyn and likely transition to oral Augmentin on discharge ?-Continue home oxycodone as needed for pain ?

## 2021-07-13 NOTE — Assessment & Plan Note (Addendum)
Hold home amlodipine as BP is on the lower side at time of admission. ?

## 2021-07-13 NOTE — Hospital Course (Signed)
Samantha Warren is a 76 y.o. 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  Started on IV Unasyn. ?

## 2021-07-13 NOTE — Assessment & Plan Note (Signed)
Undergoing IVIG every 4 weeks per oncology. ?

## 2021-07-13 NOTE — Discharge Summary (Signed)
Physician Discharge Summary  ?Samantha Warren Samantha Warren MIW:[id number] DOB: [birth year] DOA: 07/12/2021 ? ?PCP: [name], MD ? ?Admit date: 07/12/2021 ?Discharge date: 07/13/2021 ? ?Admitted From: Home ?Disposition: Home ? ?Recommendations for Outpatient Follow-up:  ?Keep up your oncology follow-up as scheduled. ?Call and schedule follow-up with oral surgeon. ? ?Home Health: N/A ?Equipment/Devices: N/A ? ?Discharge Condition: Stable ?CODE STATUS: Full code ?Diet recommendation: Low-salt diet ? ?Discharge summary: ? ?76 year old with history of CLL current therapy with venetoclax, hypertension, chronic thrombocytopenia who has carious teeth and was following up with dental surgery, she was scheduled to have dental extraction, however found to have significant swelling on the right jaw so sent to ER.  She was having 3 days of right lower tooth pain interfering with her ability to eat. ?In the emergency room hemodynamically stable.  WBC 5.9.  Platelets 74,000 that is chronic.  On arrival her creatinine was 2.15 with recent normal creatinine.  CT scan of the maxillofacial without contrast showed inflammatory changes along the anterior right mandible without evidence of collectible abscess or mass.  Treated with IV fluid, given IV Unasyn and admitted for monitoring overnight. ? ?Overnight, she did well.  Minimally symptomatic now.  Renal functions improved and normalized.  She is able to eat well with minimal pain and discomfort.  I called and discussed her case with her oral maxillofacial surgeon, Dr. Janelle Floor.  Since patient does not have any collectible abscess and remained clinically stable, we recommended patient continue antibiotic penicillin V she was prescribed and she will be followed up for dental extraction. ? ? ?Discharge Diagnoses:  ?Principal Problem: ?  Right facial cellulitis ?Active Problems: ?  AKI (acute kidney injury) ([place]) ?  Chronic lymphocytic leukemia of B-cell type not having achieved remission ([place]) ?   Combined immunodeficiency disorder ([name]) ?  Thrombocytopenia ([place]) ?  Essential hypertension ? ? ? ?Discharge Instructions ? ?Discharge Instructions   ? ? Call MD for:  redness, tenderness, or signs of infection (pain, swelling, redness, odor or green/yellow discharge around incision site)   Complete by: As directed ?  ? Call MD for:  severe uncontrolled pain   Complete by: As directed ?  ? Call MD for:  temperature >100.4   Complete by: As directed ?  ? Diet - low sodium heart healthy   Complete by: As directed ?  ? Discharge instructions   Complete by: As directed ?  ? Call and schedule appointment with your dental surgery  ? Increase activity slowly   Complete by: As directed ?  ? ?  ? ?Allergies as of 07/13/2021   ? ?   Reactions  ? Metronidazole   ? Rituximab   ? Sulfa Antibiotics Rash  ? ?  ? ?  ?Medication List  ?  ? ?STOP taking these medications   ? ?Gemtesa 75 MG Tabs ?Generic drug: Vibegron ?  ?NEOMYCIN-POLYMYXIN-HYDROCORTISONE 1 % Soln OTIC solution ?Commonly known as: CORTISPORIN ?  ? ?  ? ?TAKE these medications   ? ?alendronate 70 MG tablet ?Commonly known as: FOSAMAX ?  ?amLODipine 2.5 MG tablet ?Commonly known as: NORVASC ?Take 2.5 mg by mouth daily. ?  ?amoxicillin 500 MG capsule ?Commonly known as: AMOXIL ?Take 1,000 mg by mouth 2 (two) times daily. ?  ?benzonatate 100 MG capsule ?Commonly known as: TESSALON ?TAKE ONE CAPSULE BY MOUTH 3 TIMES A DAY AS NEEDED ?  ?calcium carbonate 1500 (600 Ca) MG Tabs tablet ?Commonly known as: OSCAL ?Take by mouth. ?  ?diclofenac  Sodium 1 % Gel ?Commonly known as: VOLTAREN ?Apply topically. ?  ?Fish Oil 1000 MG Caps ?Take 1 capsule by mouth daily. ?  ?Klor-Con M20 20 MEQ tablet ?Generic drug: potassium chloride SA ?  ?lidocaine 5 % ?Commonly known as: LIDODERM ?2 patches daily. ?  ?lidocaine-prilocaine cream ?Commonly known as: EMLA ?Apply 1 application topically as needed. ?  ?multivitamin with minerals Tabs tablet ?Take 1 tablet by mouth daily. ?  ?omeprazole 20  MG capsule ?Commonly known as: PRILOSEC ?TAKE 1 CAPSULE BY MOUTH EVERY DAY ?  ?oxyCODONE 15 MG immediate release tablet ?Commonly known as: ROXICODONE ?Take 15 mg by mouth every 6 (six) hours as needed. ?  ?penicillin v potassium 500 MG tablet ?Commonly known as: VEETID ?Take 500 mg by mouth 4 (four) times daily. ?  ?prochlorperazine 10 MG tablet ?Commonly known as: COMPAZINE ?TAKE 1 TABLET BY MOUTH EVERY 6 HOURS AS NEEDED FOR NAUSEA OR VOMITING. ?  ?Venclexta 100 MG tablet ?Generic drug: venetoclax ?Take 4 tablets (400 mg total) by mouth daily. ?  ?vitamin B-12 100 MCG tablet ?Commonly known as: CYANOCOBALAMIN ?Take 100 mcg by mouth daily. ?  ?vitamin C with rose hips 500 MG tablet ?Take 500 mg by mouth daily. ?  ?Vitamin D3 125 MCG (5000 UT) Tabs ?Take 1 tablet by mouth daily. ?  ? ?  ? ? ?Allergies  ?Allergen Reactions  ? Metronidazole   ? Rituximab   ? Sulfa Antibiotics Rash  ? ? ?Consultations: ?None ? ? ?Procedures/Studies: ?CT MAXILLOFACIAL WO CONTRAST ? ?Result Date: 07/12/2021 ?CLINICAL DATA:  Right dental pain EXAM: CT MAXILLOFACIAL WITHOUT CONTRAST TECHNIQUE: Multidetector CT imaging of the maxillofacial structures was performed. Multiplanar CT image reconstructions were also generated. RADIATION DOSE REDUCTION: This exam was performed according to the departmental dose-optimization program which includes automated exposure control, adjustment of the mA and/or kV according to patient size and/or use of iterative reconstruction technique. COMPARISON:  None. FINDINGS: Streak artifact from jewelry patient declined to remove. Streak artifact from dental amalgam. Osseous: No acute abnormality identified. Orbits: Unremarkable. Sinuses: Mild mucosal thickening. Soft tissues: Suspect asymmetric tissue thickening the anterior aspect of the right mandible. No abscess identified. Limited intracranial: No acute abnormality. IMPRESSION: Evaluation limited by significant streak artifact. Suspect inflammatory changes  along the anterior right mandible. No abscess. Electronically Signed   By: Macy Mis M.D.   On: 07/12/2021 21:20   ?(Echo, Carotid, EGD, Colonoscopy, ERCP)  ? ? ?Subjective: Patient seen and examined.  In the morning rounds she was eager to go home.  I went to examine her in the afternoon for discharge readiness.  Patient tells me mild discomfort along the right jaw without difficulty eating.  Remains afebrile.  Eager to go home. ? ? ?Discharge Exam: ?Vitals:  ? 07/13/21 0647 07/13/21 1254  ?BP: 108/66 (!) 118/59  ?Pulse: 88 85  ?Resp: 18 18  ?Temp: 98.4 ?F (36.9 ?C) 98 ?F (36.7 ?C)  ?SpO2: 98% 98%  ? ?Vitals:  ? 07/13/21 0045 07/13/21 0115 07/13/21 0647 07/13/21 1254  ?BP: (!) 103/52 (!) 99/54 108/66 (!) 118/59  ?Pulse: 82 88 88 85  ?Resp: '18 18 18 18  '$ ?Temp:   98.4 ?F (36.9 ?C) 98 ?F (36.7 ?C)  ?TempSrc:   Oral Oral  ?SpO2: 96% 95% 98% 98%  ? ? ?General: Pt is alert, awake, not in acute distress ?She has mild swelling along the posterior aspect of the right without any palpable induration or abscess. ?Extensive carious teeth present, no pus drainage  or swelling. ?Cardiovascular: RRR, S1/S2 +, no rubs, no gallops ?Patient has a port on the right chest wall that is functional. ?Respiratory: CTA bilaterally, no wheezing, no rhonchi ?Abdominal: Soft, NT, ND, bowel sounds + ?Extremities: no edema, no cyanosis ? ? ? ?The results of significant diagnostics from this hospitalization (including imaging, microbiology, ancillary and laboratory) are listed below for reference.   ? ? ?Microbiology: ?Recent Results (from the past 240 hour(s))  ?Blood culture (routine x 2)     Status: None (Preliminary result)  ? Collection Time: 07/12/21  4:05 PM  ? Specimen: BLOOD  ?Result Value Ref Range Status  ? Specimen Description BLOOD LEFT ANTECUBITAL  Final  ? Special Requests   Final  ?  BOTTLES DRAWN AEROBIC AND ANAEROBIC Blood Culture results may not be optimal due to an inadequate volume of blood received in culture bottles  ?  Culture   Final  ?  NO GROWTH < 12 HOURS ?Performed at [facility], [place] [address] [place], [place] [address] ?  ? Report Status PENDING  Incomplete  ?Blood culture (routine x 2)     Status: No

## 2021-07-13 NOTE — ED Notes (Signed)
Patient refused peripheral stick, insisted labs be drawn from her PortaCath. ?

## 2021-07-13 NOTE — Assessment & Plan Note (Signed)
Follows with oncology, Dr. Hinton Rao, on active treatment with venetoclax. ?

## 2021-07-17 LAB — CULTURE, BLOOD (ROUTINE X 2)
Culture: NO GROWTH
Culture: NO GROWTH

## 2021-07-21 ENCOUNTER — Encounter: Payer: Self-pay | Admitting: Oncology

## 2021-08-02 ENCOUNTER — Other Ambulatory Visit: Payer: Self-pay | Admitting: Pharmacist

## 2021-08-06 ENCOUNTER — Inpatient Hospital Stay: Payer: Medicare Other | Attending: Hematology and Oncology | Admitting: Oncology

## 2021-08-06 ENCOUNTER — Telehealth: Payer: Self-pay | Admitting: Oncology

## 2021-08-06 ENCOUNTER — Inpatient Hospital Stay: Payer: Medicare Other

## 2021-08-06 ENCOUNTER — Encounter: Payer: Self-pay | Admitting: Oncology

## 2021-08-06 ENCOUNTER — Other Ambulatory Visit: Payer: Self-pay | Admitting: Oncology

## 2021-08-06 VITALS — BP 132/74 | HR 88 | Temp 98.2°F | Resp 18 | Ht 61.5 in | Wt 220.0 lb

## 2021-08-06 VITALS — BP 139/60 | HR 70 | Temp 97.5°F | Resp 16 | Wt 218.0 lb

## 2021-08-06 DIAGNOSIS — C9110 Chronic lymphocytic leukemia of B-cell type not having achieved remission: Secondary | ICD-10-CM

## 2021-08-06 DIAGNOSIS — D801 Nonfamilial hypogammaglobulinemia: Secondary | ICD-10-CM

## 2021-08-06 DIAGNOSIS — D819 Combined immunodeficiency, unspecified: Secondary | ICD-10-CM | POA: Diagnosis present

## 2021-08-06 LAB — BASIC METABOLIC PANEL
BUN: 23 — AB (ref 4–21)
CO2: 24 — AB (ref 13–22)
Chloride: 110 — AB (ref 99–108)
Creatinine: 0.9 (ref 0.5–1.1)
Glucose: 86
Potassium: 3.9 mEq/L (ref 3.5–5.1)
Sodium: 145 (ref 137–147)

## 2021-08-06 LAB — CBC AND DIFFERENTIAL
HCT: 37 (ref 36–46)
Hemoglobin: 11.8 — AB (ref 12.0–16.0)
Neutrophils Absolute: 1.4
Platelets: 61 10*3/uL — AB (ref 150–400)
WBC: 4

## 2021-08-06 LAB — CBC: RBC: 4.03 (ref 3.87–5.11)

## 2021-08-06 LAB — HEPATIC FUNCTION PANEL
ALT: 28 U/L (ref 7–35)
AST: 31 (ref 13–35)
Alkaline Phosphatase: 72 (ref 25–125)
Bilirubin, Total: 0.6

## 2021-08-06 LAB — COMPREHENSIVE METABOLIC PANEL
Albumin: 4.4 (ref 3.5–5.0)
Calcium: 8.6 — AB (ref 8.7–10.7)

## 2021-08-06 MED ORDER — DEXTROSE 5 % IV SOLN: Freq: Once | INTRAVENOUS | Status: AC

## 2021-08-06 MED ORDER — SODIUM CHLORIDE 0.9% FLUSH
10.0000 mL | Freq: Once | INTRAVENOUS | Status: AC | PRN
  Administered 2021-08-06: 10 mL

## 2021-08-06 MED ORDER — IMMUNE GLOBULIN (HUMAN) 10 GM/100ML IV SOLN
0.9000 g/kg | Freq: Once | INTRAVENOUS | Status: AC
  Administered 2021-08-06: 45 g via INTRAVENOUS
  Filled 2021-08-06: qty 450

## 2021-08-06 MED ORDER — DIPHENHYDRAMINE HCL 50 MG/ML IJ SOLN
25.0000 mg | Freq: Once | INTRAMUSCULAR | Status: AC
  Administered 2021-08-06: 25 mg via INTRAVENOUS
  Filled 2021-08-06: qty 1

## 2021-08-06 MED ORDER — HEPARIN SOD (PORK) LOCK FLUSH 100 UNIT/ML IV SOLN: 500.0000 [IU] | Freq: Once | INTRAVENOUS | Status: DC | PRN

## 2021-08-06 MED ORDER — ALTEPLASE 2 MG IJ SOLR
2.0000 mg | Freq: Once | INTRAMUSCULAR | Status: AC | PRN
  Administered 2021-08-06: 2 mg
  Filled 2021-08-06: qty 2

## 2021-08-06 MED ORDER — ACETAMINOPHEN 325 MG PO TABS
650.0000 mg | ORAL_TABLET | Freq: Once | ORAL | Status: AC
  Administered 2021-08-06: 650 mg via ORAL
  Filled 2021-08-06: qty 2

## 2021-08-06 MED ORDER — HEPARIN SOD (PORK) LOCK FLUSH 100 UNIT/ML IV SOLN
500.0000 [IU] | Freq: Once | INTRAVENOUS | Status: AC | PRN
  Administered 2021-08-06: 500 [IU]

## 2021-08-06 MED FILL — Immune Globulin (Human) IV Soln 10 GM/100ML: INTRAVENOUS | Qty: 450 | Status: AC

## 2021-08-06 NOTE — Progress Notes (Signed)
1356:PT STABLE AT TIME OF DISCHARGE ?

## 2021-08-06 NOTE — Progress Notes (Signed)
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  She did not require treatment until 2008 and has since been through multiple therapies.  She is currently taking venetoclax 400 mg daily with good control of her disease for over 4 years. Her blood counts remain stable.   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  She wanted to space out her treatments, but we recommended she continue IVIG every 4 weeks through the winter months.  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   Recent dental abscess She had IV antibiotics and dental extraction, doing better now   She will proceed with IVIG today. She knows to continue venetoclax 400 mg daily and we will make sure she has refills. We will plan to see her back in 4 weeks with a CBC and comprehensive metabolic panel prior to her next IVIG. The patient understands the plans discussed today and is in agreement with them.  She knows to contact our office if she develops concerns prior to her next appointment.  I provided 15 minutes of face-to-face time during this this encounter and > 50% was spent counseling as documented under my assessment and plan.    [place] [address] [place] [address] Dept: [phone] Dept Fax: [phone]      CHIEF COMPLAINT:  CC: Chronic lymphocytic leukemia with associated immunodeficiency  Current Treatment: Venetoclax 400 mg daily with IVIG every 4 weeks   HISTORY OF PRESENT  ILLNESS:  Samantha Warren is a 76 y.o. 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  She was on observation until November 2008. She was initially treated with chlorambucil and prednisone, as she refused intravenous chemotherapy.  She had a partial response to this regimen.  By January 2010, she had progressive disease with a white count over 200,000, with associated anemia, splenomegaly, thrombocytopenia, and adenopathy.  She then received 5 cycles of fludarabine with good response.  She did well until January 2012, when she had progression once again.  She had a single dose of bendamustine and rituximab, which kept her disease under control for over a year.  However, she had a severe allergic reaction to the rituximab, so has not received further rituximab.  She was hospitalized after the bendamustine because of severe pancytopenia and required multiple transfusions, so was placed on observation.  In March 2013, she had progression of disease again, so was treated with bendamustine for 6 cycles, at a 50% dose reduction, and once again had a excellent response.  She was on observation until August 2014, then had progression of disease, so was placed on ibrutinib 420 mg daily.  The ibrutinib had kept her disease under fairly good control, but then she develops severe bilateral lower extremity cellulitis in April 2016, so ibrutinib was placed on hold.  She had persistent cellulitis, as well as  Clostridium difficile colitis in May 2016 requiring hospitalization.  She was readmitted soon after discharge with worsening cellulitis, requiring a prolonged hospitalization.  She was also found to have severe combined immunodeficiency secondary  to her CLL, so began receiving IVIG monthly in June 2016. She eventually had resolution of the cellulitis   As she was largely asymptomatic, we continued observation until February 2017, at which time she had worsening splenomegaly.  Repeat CT  imaging in February showed progressive disease, with marked splenomegaly and bulky lymphadenopathy.  She was then placed back on ibrutinib 436m daily.  The lymphocytosis, anemia and thrombocytopenia slowly improved.  Due to neutropenia, ibrutinib was placed on hold in December 2017.  The ibrutinib was resumed in January 2018, but discontinued in August 2018 due to toxicities, mainly severe abdominal muscle cramping.  She was hospitalized in early September 2018 with urinary tract infection and pancytopenia.  She received multiple red blood cell and platelet transfusions during her stay.  She was admitted again in late September 2018 with perineal cellulitis extending to the lower abdomen and upper thigh, as well as persistent pancytopenia.  CT abdomen and pelvis revealed interval decrease in the abdominal lymphadenopathy, chronic massive splenomegaly with scattered small splenic infarcts, and small left pleural effusion with bibasilar atelectasis.  She required packed red blood cells while hospitalized.  She was discharged home with home health and followed up with the [facility]  We continued to follow her closely, but did not place her on a treatment due to the persistent cellulitis.  She then was admitted in October 2018 with severe hypercalcemia, with a calcium of 15.  She was treated with IV fluids, zoledronic acid and Calcitonin injections.  We continued to monitor her closely.  She had recurrent hypercalcemia, for which she received IV fluids and zolendronic acid as an outpatient.     Due to worsening pancytopenia, she underwent bone marrow biopsy in January 2019.   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  The cellulitis had improved to a point we felt she could be treated again, so she was placed on venetoclax CLL in January 2019.  She has continued on venetoclax 400 mg daily after ramp up dosing and has tolerated that fairly  well, except for mild diarrhea.  Since starting venetoclax, she has had decreasing splenomegaly and improvement in her pancytopenia.  She initially continued to require IV fluids and zoledronic acid for the hypercalcemia.  Her last dose of zoledronic acid was given in March 2019.  In March, she had an irregular heart rhythm and EKG revealed occasional premature atrial complexes, as well as an incomplete right bundle branch block.  This was stable compared to EKG done in October 2018.  Quantitative immunoglobulins done in August 2019 revealed a normal IgG, with low IgA and IgM.  She has continued IVIG monthly.  She had an episode of Campylobacter diarrhea in November 2019, which was treated with azithromycin for 7 days with resolution of her diarrhea. Venetoclax was held temporarily until her diarrhea resolved.  She has since tolerated venetoclax without significant difficulty.     She completed a full 2 years of monotherapy with venetoclax in January 2021.  We recommended continuing venetoclax 400 mg daily until progression of disease or unacceptable toxicities.  Annual bilateral mammogram in Apri 2021 revealed a possible mass in the left breast, which warranted further evaluation. Right breast did not reveal any evidence of malignancy.  Unilateral diagnostic left mammogram in May, revealed benign fibrocystic change within the outer left breast with no evidence of malignancy.  Routine annual bilateral mammography was recommended.    In February 2022, she had a right diagnostic mammogram  due to right breast subareolar tenderness, which did not reveal any evidence of malignancy.  CT abdomen in April did not reveal any acute abnormality or other findings to explain the left-sided abdominal pain.  There was resolution of previously seen gross splenomegaly.  There were prominent retroperitoneal lymph nodes, which had significantly diminished in size compared to prior exam.  A hiatal hernia was seen. There was  descending colonic diverticulosis without evidence of acute diverticulitis.  Bilateral screening mammogram in May 2022 revealed possible asymmetry in the left breast.  Left diagnostic mammogram and ultrasound in June revealed a cluster of cysts/apocrine cyst in the left breast at 2 o'clock, 3 cm the nipple, measuring 6 x 3 x 6 mm, unchanged from the exam dated May 2021. There were no solid masses or suspicious lesions. She will be due for screening mammogram in May 2023. She is up to date on colonoscopy with Dr. Lyda Jester as of June 2022 which was negative, and repeat in 10 years was advised.   INTERVAL HISTORY:  Zofia is here for routine follow up prior to her next IVIG, and she is doing well. She continues venetoclax 400 mg daily without difficulty.  She has chronic arthralgias of the bilateral knees. She had recent dental abscess and went to [facility] for IV antibiotics and dental extraction. She finished the antibiotics and is doing well.  We were unable to get blood return from her port but it flushed correctly.  She is having a genital wart excised by Dr. Marin Roberts soon as OP. White count is stable at 3.3 with an ANC of 1350, platelets have mildly improved to 63,000, and hemoglobin is 11.5. Chemistries are unremarkable except for a BUN of 24. Her  appetite is good, and her weight is down 1 pound since her last visit.  She denies fever, chills or other signs of infection.  She denies nausea, vomiting, bowel issues, or abdominal pain.  She denies sore throat, cough, dyspnea, or chest pain.  REVIEW OF SYSTEMS:  Review of Systems  Constitutional: Negative.  Negative for appetite change, chills, fatigue, fever and unexpected weight change.  HENT:  Negative.    Eyes: Negative.   Respiratory: Negative.  Negative for chest tightness, cough, hemoptysis, shortness of breath and wheezing.   Cardiovascular: Negative.  Negative for chest pain, leg swelling and palpitations.  Gastrointestinal: Negative.  Negative  for abdominal distention, abdominal pain, blood in stool, constipation, diarrhea, nausea and vomiting.  Endocrine: Negative.   Genitourinary:  Negative for difficulty urinating, dysuria, frequency and hematuria.   Musculoskeletal:  Positive for arthralgias (of the bilateral knees). Negative for back pain, flank pain, gait problem and myalgias.  Skin: Negative.   Neurological: Negative.  Negative for dizziness, extremity weakness, gait problem, headaches, light-headedness, numbness, seizures and speech difficulty.  Hematological: Negative.   Psychiatric/Behavioral: Negative.  Negative for depression and sleep disturbance. The patient is not nervous/anxious.     VITALS:  Blood pressure 132/74, pulse 88, temperature 98.2 F (36.8 C), temperature source Oral, resp. rate 18, height 5' 1.5" (1.562 m), weight 220 lb (99.8 kg), SpO2 99 %.  Wt Readings from Last 3 Encounters:  08/06/21 218 lb (98.9 kg)  08/06/21 220 lb (99.8 kg)  07/09/21 221 lb 12 oz (100.6 kg)    Body mass index is 40.9 kg/m.  Performance status (ECOG): 0 - Asymptomatic  PHYSICAL EXAM:  Physical Exam Constitutional:      General: She is not in acute distress.    Appearance: Normal appearance. She  is normal weight.  HENT:     Head: Normocephalic and atraumatic.  Eyes:     General: No scleral icterus.    Extraocular Movements: Extraocular movements intact.     Conjunctiva/sclera: Conjunctivae normal.     Pupils: Pupils are equal, round, and reactive to light.  Cardiovascular:     Rate and Rhythm: Normal rate and regular rhythm.     Pulses: Normal pulses.     Heart sounds: Normal heart sounds. No murmur heard.   No friction rub. No gallop.  Pulmonary:     Effort: Pulmonary effort is normal. No respiratory distress.     Breath sounds: Normal breath sounds.  Abdominal:     General: Bowel sounds are normal. There is no distension.     Palpations: Abdomen is soft. There is no hepatomegaly, splenomegaly or mass.      Tenderness: There is no abdominal tenderness.  Musculoskeletal:        General: Normal range of motion.     Cervical back: Normal range of motion and neck supple.     Right lower leg: No edema.     Left lower leg: No edema.  Lymphadenopathy:     Cervical: No cervical adenopathy.  Skin:    General: Skin is warm and dry.  Neurological:     General: No focal deficit present.     Mental Status: She is alert and oriented to person, place, and time. Mental status is at baseline.  Psychiatric:        Mood and Affect: Mood normal.        Behavior: Behavior normal.        Thought Content: Thought content normal.        Judgment: Judgment normal.    LABS:      Latest Ref Rng & Units 08/06/2021   12:00 AM 07/13/2021    8:55 AM 07/12/2021    4:02 PM  CBC  WBC  4.0      3.6   5.9    Hemoglobin 12.0 - 16.0 11.8      10.6   12.7    Hematocrit 36 - 46 37      32.9   38.9    Platelets 150 - 400 K/uL 61      64   74       This result is from an external source.      Latest Ref Rng & Units 08/06/2021   12:00 AM 07/13/2021    1:20 PM 07/12/2021    4:02 PM  CMP  Glucose 70 - 99 mg/dL  73   143    BUN 4 - 21 23      17   23     Creatinine 0.5 - 1.1 0.9      1.00   2.15    Sodium 137 - 147 145      140   137    Potassium 3.5 - 5.1 mEq/L 3.9      3.9   3.8    Chloride 99 - 108 110      112   104    CO2 13 - 22 24      23   23     Calcium 8.7 - 10.7 8.6      7.5   8.8    Total Protein 6.5 - 8.1 g/dL   8.1    Total Bilirubin 0.3 - 1.2 mg/dL   0.8    Alkaline Phos 25 -  125 72       55    AST 13 - 35 31       21    ALT 7 - 35 U/L 28       20       This result is from an external source.     STUDIES:  No results found.    HISTORY:   Allergies:  Allergies  Allergen Reactions   Metronidazole     Other reaction(s): Other (See Comments)   Rituximab     Other reaction(s): Other (See Comments)   Sulfa Antibiotics Rash    Current Medications: Current Outpatient Medications  Medication  Sig Dispense Refill   alendronate (FOSAMAX) 70 MG tablet      amLODipine (NORVASC) 2.5 MG tablet Take 2.5 mg by mouth daily.     Ascorbic Acid (VITAMIN C WITH ROSE HIPS) 500 MG tablet Take 500 mg by mouth daily.     benzonatate (TESSALON) 100 MG capsule TAKE ONE CAPSULE BY MOUTH 3 TIMES A DAY AS NEEDED 60 capsule 5   calcium carbonate (OSCAL) 1500 (600 Ca) MG TABS tablet Take by mouth.     Cholecalciferol (VITAMIN D3) 125 MCG (5000 UT) TABS Take 1 tablet by mouth daily.     diclofenac Sodium (VOLTAREN) 1 % GEL Apply topically.     KLOR-CON M20 20 MEQ tablet      lidocaine (LIDODERM) 5 % 2 patches daily.     lidocaine-prilocaine (EMLA) cream Apply 1 application topically as needed. 30 g 5   Multiple Vitamin (MULTIVITAMIN WITH MINERALS) TABS tablet Take 1 tablet by mouth daily.     Omega-3 Fatty Acids (FISH OIL) 1000 MG CAPS Take 1 capsule by mouth daily.     omeprazole (PRILOSEC) 20 MG capsule TAKE 1 CAPSULE BY MOUTH EVERY DAY 90 capsule 3   oxyCODONE (ROXICODONE) 15 MG immediate release tablet Take 15 mg by mouth every 6 (six) hours as needed.     prochlorperazine (COMPAZINE) 10 MG tablet TAKE 1 TABLET BY MOUTH EVERY 6 HOURS AS NEEDED FOR NAUSEA OR VOMITING. 90 tablet 1   VENCLEXTA 100 MG tablet Take 4 tablets (400 mg total) by mouth daily. 30 tablet 0   vitamin B-12 (CYANOCOBALAMIN) 100 MCG tablet Take 100 mcg by mouth daily.     Current Facility-Administered Medications  Medication Dose Route Frequency Provider Last Rate Last Admin   heparin lock flush 100 unit/mL  500 Units Intracatheter Once PRN Derwood Kaplan, MD       Facility-Administered Medications Ordered in Other Visits  Medication Dose Route Frequency Provider Last Rate Last Admin   influenza vac split quadrivalent PF (FLUARIX) injection 0.5 mL  0.5 mL Intramuscular Once Derwood Kaplan, MD       sodium chloride flush (NS) 0.9 % injection 10 mL  10 mL Intracatheter PRN Mosher, Kelli A, PA-C   10 mL at 09/26/20 [phone]

## 2021-08-06 NOTE — Telephone Encounter (Signed)
Per 08/06/21 los next appt scheduled and confirmed with patient ?

## 2021-08-06 NOTE — Patient Instructions (Signed)

## 2021-08-20 ENCOUNTER — Encounter: Payer: Self-pay | Admitting: Oncology

## 2021-08-29 ENCOUNTER — Other Ambulatory Visit: Payer: Self-pay

## 2021-08-31 MED FILL — Immune Globulin (Human) IV Soln 10 GM/100ML: INTRAVENOUS | Qty: 450 | Status: AC

## 2021-09-03 ENCOUNTER — Encounter: Payer: Self-pay | Admitting: Hematology and Oncology

## 2021-09-03 ENCOUNTER — Inpatient Hospital Stay: Payer: Medicare Other

## 2021-09-03 ENCOUNTER — Telehealth: Payer: Self-pay | Admitting: Hematology and Oncology

## 2021-09-03 ENCOUNTER — Inpatient Hospital Stay: Payer: Medicare Other | Attending: Hematology and Oncology | Admitting: Hematology and Oncology

## 2021-09-03 VITALS — BP 141/65 | HR 82 | Temp 98.4°F | Resp 18 | Ht 61.5 in | Wt 222.0 lb

## 2021-09-03 DIAGNOSIS — C9110 Chronic lymphocytic leukemia of B-cell type not having achieved remission: Secondary | ICD-10-CM | POA: Diagnosis not present

## 2021-09-03 DIAGNOSIS — D819 Combined immunodeficiency, unspecified: Secondary | ICD-10-CM | POA: Diagnosis present

## 2021-09-03 DIAGNOSIS — D801 Nonfamilial hypogammaglobulinemia: Secondary | ICD-10-CM

## 2021-09-03 DIAGNOSIS — G8929 Other chronic pain: Secondary | ICD-10-CM | POA: Diagnosis not present

## 2021-09-03 LAB — BASIC METABOLIC PANEL
BUN: 26 — AB (ref 4–21)
CO2: 26 — AB (ref 13–22)
Creatinine: 0.9 (ref 0.5–1.1)
Glucose: 120
Potassium: 4.1 mEq/L (ref 3.5–5.1)
Sodium: 141 (ref 137–147)

## 2021-09-03 LAB — HEPATIC FUNCTION PANEL
ALT: 24 U/L (ref 7–35)
AST: 28 (ref 13–35)
Alkaline Phosphatase: 71 (ref 25–125)
Bilirubin, Total: 0.5

## 2021-09-03 LAB — CBC AND DIFFERENTIAL
HCT: 34 — AB (ref 36–46)
Hemoglobin: 11.1 — AB (ref 12.0–16.0)
Neutrophils Absolute: 1.15
Platelets: 70 10*3/uL — AB (ref 150–400)
WBC: 3.1

## 2021-09-03 LAB — COMPREHENSIVE METABOLIC PANEL
Albumin: 4.2 (ref 3.5–5.0)
Calcium: 8.7 (ref 8.7–10.7)

## 2021-09-03 LAB — CBC: RBC: 3.78 — AB (ref 3.87–5.11)

## 2021-09-03 MED ORDER — IMMUNE GLOBULIN (HUMAN) 10 GM/100ML IV SOLN
0.9000 g/kg | Freq: Once | INTRAVENOUS | Status: AC
  Administered 2021-09-03: 45 g via INTRAVENOUS
  Filled 2021-09-03: qty 400

## 2021-09-03 MED ORDER — DIPHENHYDRAMINE HCL 50 MG/ML IJ SOLN
25.0000 mg | Freq: Once | INTRAMUSCULAR | Status: AC
  Administered 2021-09-03: 25 mg via INTRAVENOUS

## 2021-09-03 MED ORDER — DEXTROSE 5 % IV SOLN: Freq: Once | INTRAVENOUS | Status: AC

## 2021-09-03 MED ORDER — HEPARIN SOD (PORK) LOCK FLUSH 100 UNIT/ML IV SOLN
500.0000 [IU] | Freq: Once | INTRAVENOUS | Status: AC | PRN
  Administered 2021-09-03: 500 [IU]

## 2021-09-03 MED ORDER — ACETAMINOPHEN 325 MG PO TABS
650.0000 mg | ORAL_TABLET | Freq: Once | ORAL | Status: AC
  Administered 2021-09-03: 650 mg via ORAL

## 2021-09-03 MED ORDER — SODIUM CHLORIDE 0.9% FLUSH
10.0000 mL | Freq: Once | INTRAVENOUS | Status: DC | PRN
  Administered 2021-09-03: 10 mL

## 2021-09-03 NOTE — Patient Instructions (Signed)

## 2021-09-03 NOTE — Assessment & Plan Note (Addendum)
Her chronic bilateral knee pain is fairly well controlled with oxycodone as needed. 

## 2021-09-03 NOTE — Assessment & Plan Note (Signed)
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 She did not require treatment until 2008 and has since been through multiple therapies. She is currently takingvenetoclax 400 mg daily with good control of her disease for over 4 years. Her blood counts remain stable. She will return to clinic in 4 weeks for repeat evaluation. 

## 2021-09-03 NOTE — Telephone Encounter (Signed)
Per 09/03/21 los next appt scheduled and confirmed with patient

## 2021-09-03 NOTE — Assessment & Plan Note (Signed)
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 She wanted to space out her treatments, but we recommended she continue IVIG every 4 weeks through the winter months. She is due for treatment today and will proceed with IVIG. 

## 2021-09-03 NOTE — Progress Notes (Signed)
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  She did not require treatment until 2008 and has since been through multiple therapies.  She is currently taking venetoclax 400 mg daily with good control of her disease for over 4 years. Her blood counts remain stable. She will return to clinic in 4 weeks for repeat evaluation.  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  She wanted to space out her treatments, but we recommended she continue IVIG every 4 weeks through the winter months. She is due for treatment today and will proceed with IVIG.  Chronic pain Her chronic bilateral knee pain is fairly well controlled with oxycodone as needed.    The patient understands the plans discussed today and is in agreement with them.  She knows to contact our office if she develops concerns prior to her next appointment.    Samantha Ped, NP  [place] [address] [place] [address] Dept: [phone] Dept Fax: [phone]   Orders Placed This Encounter  Procedures   CBC and differential    This external order was created through the Results Console.   CBC    This external order was created through the Results Console.   Basic metabolic panel    This external order was created through the Results Console.   Comprehensive metabolic panel    This external order was created through the Results Console.   Hepatic function panel    This external order was created through the Results Console.      CHIEF COMPLAINT:  CC: A 76-year  old female with history of CLL here for 4 week evaluation   Current Treatment:  Venclexta/ IVIG  INTERVAL HISTORY:  Samantha Warren is here today for repeat clinical assessment. She denies fevers or chills. She denies pain. Her appetite is good. Her weight has increased 4 pounds over last 4 weeks .  I have reviewed the past medical history, past surgical history, social history and family history with the patient and they are unchanged from previous note.  ALLERGIES:  is allergic to metronidazole, rituximab, and sulfa antibiotics.  MEDICATIONS:  Current Outpatient Medications  Medication Sig Dispense Refill   alendronate (FOSAMAX) 70 MG tablet      amLODipine (NORVASC) 2.5 MG tablet Take 2.5 mg by mouth daily.     Ascorbic Acid (VITAMIN C WITH ROSE HIPS) 500 MG tablet Take 500 mg by mouth daily.     benzonatate (TESSALON) 100 MG capsule TAKE ONE CAPSULE BY MOUTH 3 TIMES A DAY AS NEEDED 60 capsule 5   calcium carbonate (OSCAL) 1500 (600 Ca) MG TABS tablet Take by mouth.     Cholecalciferol (VITAMIN D3) 125 MCG (5000 UT) TABS Take 1 tablet by mouth daily.     diclofenac Sodium (VOLTAREN) 1 % GEL Apply topically.     ibuprofen (ADVIL) 800 MG tablet      KLOR-CON M20 20 MEQ tablet      lidocaine (LIDODERM) 5 % 2 patches daily.     lidocaine-prilocaine (EMLA) cream Apply 1 application topically as needed. 30 g 5   Multiple Vitamin (MULTIVITAMIN  WITH MINERALS) TABS tablet Take 1 tablet by mouth daily.     Omega-3 Fatty Acids (FISH OIL) 1000 MG CAPS Take 1 capsule by mouth daily.     omeprazole (PRILOSEC) 20 MG capsule TAKE 1 CAPSULE BY MOUTH EVERY DAY 90 capsule 3   oxyCODONE (ROXICODONE) 15 MG immediate release tablet Take 15 mg by mouth every 6 (six) hours as needed.     prochlorperazine (COMPAZINE) 10 MG tablet TAKE 1 TABLET BY MOUTH EVERY 6 HOURS AS NEEDED FOR NAUSEA OR VOMITING. 90 tablet 1   VENCLEXTA 100 MG tablet Take 4 tablets (400 mg total) by mouth daily. 30 tablet 0   vitamin B-12  (CYANOCOBALAMIN) 100 MCG tablet Take 100 mcg by mouth daily.     No current facility-administered medications for this visit.   Facility-Administered Medications Ordered in Other Visits  Medication Dose Route Frequency Provider Last Rate Last Admin   influenza vac split quadrivalent PF (FLUARIX) injection 0.5 mL  0.5 mL Intramuscular Once Samantha Kaplan, MD       sodium chloride flush (NS) 0.9 % injection 10 mL  10 mL Intracatheter PRN Warren, Samantha A, PA-C   10 mL at 09/26/20 0839    HISTORY OF PRESENT ILLNESS:   Oncology History   No history exists.      REVIEW OF SYSTEMS:   Constitutional: Denies fevers, chills or abnormal weight loss Eyes: Denies blurriness of vision Ears, nose, mouth, throat, and face: Denies mucositis or sore throat Respiratory: Denies cough, dyspnea or wheezes Cardiovascular: Denies palpitation, chest discomfort or lower extremity swelling Gastrointestinal:  Denies nausea, heartburn or change in bowel habits Skin: Denies abnormal skin rashes Lymphatics: Denies new lymphadenopathy or easy bruising Neurological:Denies numbness, tingling or new weaknesses Behavioral/Psych: Mood is stable, no new changes  All other systems were reviewed with the patient and are negative.   VITALS:  There were no vitals taken for this visit.  Wt Readings from Last 3 Encounters:  08/06/21 218 lb (98.9 kg)  08/06/21 220 lb (99.8 kg)  07/09/21 221 lb 12 oz (100.6 kg)    There is no height or weight on file to calculate BMI.  Performance status (ECOG): 1 - Symptomatic but completely ambulatory  PHYSICAL EXAM:   GENERAL:alert, no distress and comfortable SKIN: skin color, texture, turgor are normal, no rashes or significant lesions EYES: normal, Conjunctiva are pink and non-injected, sclera clear OROPHARYNX:no exudate, no erythema and lips, buccal mucosa, and tongue normal  NECK: supple, thyroid normal size, non-tender, without nodularity LYMPH:  no palpable  lymphadenopathy in the cervical, axillary or inguinal LUNGS: clear to auscultation and percussion with normal breathing effort HEART: regular rate & rhythm and no murmurs and no lower extremity edema ABDOMEN:abdomen soft, non-tender and normal bowel sounds Musculoskeletal:no cyanosis of digits and no clubbing  NEURO: alert & oriented x 3 with fluent speech, no focal motor/sensory deficits  LABORATORY DATA:  I have reviewed the data as listed    Component Value Date/Time   NA 141 09/03/2021 0000   K 4.1 09/03/2021 0000   CL 110 (A) 08/06/2021 0000   CO2 26 (A) 09/03/2021 0000   GLUCOSE 73 07/13/2021 1320   BUN 26 (A) 09/03/2021 0000   CREATININE 0.9 09/03/2021 0000   CREATININE 1.00 07/13/2021 1320   CALCIUM 8.7 09/03/2021 0000   PROT 8.1 07/12/2021 1602   ALBUMIN 4.2 09/03/2021 0000   AST 28 09/03/2021 0000   ALT 24 09/03/2021 0000   ALKPHOS 71 09/03/2021 0000  BILITOT 0.8 07/12/2021 1602   GFRNONAA 59 (L) 07/13/2021 1320    No results found for: SPEP, UPEP  Lab Results  Component Value Date   WBC 3.1 09/03/2021   NEUTROABS 1.15 09/03/2021   HGB 11.1 (A) 09/03/2021   HCT 34 (A) 09/03/2021   MCV 95.1 07/13/2021   PLT 70 (A) 09/03/2021      Chemistry      Component Value Date/Time   NA 141 09/03/2021 0000   K 4.1 09/03/2021 0000   CL 110 (A) 08/06/2021 0000   CO2 26 (A) 09/03/2021 0000   BUN 26 (A) 09/03/2021 0000   CREATININE 0.9 09/03/2021 0000   CREATININE 1.00 07/13/2021 1320   GLU 120 09/03/2021 0000      Component Value Date/Time   CALCIUM 8.7 09/03/2021 0000   ALKPHOS 71 09/03/2021 0000   AST 28 09/03/2021 0000   ALT 24 09/03/2021 0000   BILITOT 0.8 07/12/2021 1602       RADIOGRAPHIC STUDIES: I have personally reviewed the radiological images as listed and agreed with the findings in the report. No results found.

## 2021-09-07 ENCOUNTER — Encounter: Payer: Self-pay | Admitting: Hematology and Oncology

## 2021-09-21 ENCOUNTER — Other Ambulatory Visit: Payer: Self-pay

## 2021-10-01 ENCOUNTER — Telehealth: Payer: Self-pay | Admitting: Hematology and Oncology

## 2021-10-01 ENCOUNTER — Inpatient Hospital Stay: Payer: Medicare Other

## 2021-10-01 ENCOUNTER — Encounter: Payer: Self-pay | Admitting: Hematology and Oncology

## 2021-10-01 ENCOUNTER — Inpatient Hospital Stay: Payer: Medicare Other | Attending: Hematology and Oncology | Admitting: Hematology and Oncology

## 2021-10-01 VITALS — BP 144/60 | HR 67 | Temp 97.7°F | Resp 18 | Ht 61.5 in | Wt 224.3 lb

## 2021-10-01 VITALS — BP 129/68 | HR 65 | Temp 98.3°F | Resp 18 | Ht 61.5 in | Wt 224.8 lb

## 2021-10-01 DIAGNOSIS — C9110 Chronic lymphocytic leukemia of B-cell type not having achieved remission: Secondary | ICD-10-CM

## 2021-10-01 DIAGNOSIS — Z452 Encounter for adjustment and management of vascular access device: Secondary | ICD-10-CM | POA: Insufficient documentation

## 2021-10-01 DIAGNOSIS — D819 Combined immunodeficiency, unspecified: Secondary | ICD-10-CM | POA: Diagnosis present

## 2021-10-01 DIAGNOSIS — D801 Nonfamilial hypogammaglobulinemia: Secondary | ICD-10-CM

## 2021-10-01 DIAGNOSIS — G8929 Other chronic pain: Secondary | ICD-10-CM | POA: Diagnosis not present

## 2021-10-01 LAB — BASIC METABOLIC PANEL
BUN: 20 (ref 4–21)
CO2: 26 — AB (ref 13–22)
Chloride: 108 (ref 99–108)
Creatinine: 0.9 (ref 0.5–1.1)
Glucose: 94
Potassium: 4 mEq/L (ref 3.5–5.1)
Sodium: 140 (ref 137–147)

## 2021-10-01 LAB — HEPATIC FUNCTION PANEL
ALT: 26 U/L (ref 7–35)
AST: 26 (ref 13–35)
Alkaline Phosphatase: 82 (ref 25–125)
Bilirubin, Total: 0.5

## 2021-10-01 LAB — COMPREHENSIVE METABOLIC PANEL WITH GFR
Albumin: 3.9 (ref 3.5–5.0)
Calcium: 8.1 — AB (ref 8.7–10.7)

## 2021-10-01 LAB — CBC AND DIFFERENTIAL
HCT: 34 — AB (ref 36–46)
Hemoglobin: 11.3 — AB (ref 12.0–16.0)
Neutrophils Absolute: 1.44
Platelets: 59 10*3/uL — AB (ref 150–400)
WBC: 3.5

## 2021-10-01 LAB — CBC: RBC: 3.72 — AB (ref 3.87–5.11)

## 2021-10-01 MED ORDER — HEPARIN SOD (PORK) LOCK FLUSH 100 UNIT/ML IV SOLN
500.0000 [IU] | Freq: Once | INTRAVENOUS | Status: AC | PRN
  Administered 2021-10-01: 500 [IU]

## 2021-10-01 MED ORDER — SODIUM CHLORIDE 0.9% FLUSH: 10.0000 mL | Freq: Once | INTRAVENOUS | Status: DC | PRN

## 2021-10-01 MED ORDER — DIPHENHYDRAMINE HCL 50 MG/ML IJ SOLN
25.0000 mg | Freq: Once | INTRAMUSCULAR | Status: AC
  Administered 2021-10-01: 25 mg via INTRAVENOUS
  Filled 2021-10-01: qty 1

## 2021-10-01 MED ORDER — ACETAMINOPHEN 325 MG PO TABS
650.0000 mg | ORAL_TABLET | Freq: Once | ORAL | Status: AC
  Administered 2021-10-01: 650 mg via ORAL
  Filled 2021-10-01: qty 2

## 2021-10-01 MED ORDER — IMMUNE GLOBULIN (HUMAN) 10 GM/100ML IV SOLN
0.9000 g/kg | Freq: Once | INTRAVENOUS | Status: AC
  Administered 2021-10-01: 45 g via INTRAVENOUS
  Filled 2021-10-01: qty 450

## 2021-10-01 MED ORDER — DEXTROSE 5 % IV SOLN: Freq: Once | INTRAVENOUS | Status: AC

## 2021-10-01 MED ORDER — SODIUM CHLORIDE 0.9% FLUSH
10.0000 mL | INTRAVENOUS | Status: DC | PRN
  Administered 2021-10-01 (×2): 10 mL

## 2021-10-01 MED FILL — Immune Globulin (Human) IV Soln 10 GM/100ML: INTRAVENOUS | Qty: 450 | Status: AC

## 2021-10-01 NOTE — Telephone Encounter (Signed)
Per 10/01/21 los next appt scheduled and confirmed with patient

## 2021-10-01 NOTE — Progress Notes (Signed)
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  She did not require treatment until 2008 and has since been through multiple therapies.  She is currently taking venetoclax 400 mg daily with good control of her disease for over 4 years. Her blood counts remain stable. She will return to clinic in 4 weeks for repeat evaluation.  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  She wanted to space out her treatments, but we recommended she continue IVIG every 4 weeks through the winter months. She is due for treatment today and will proceed with IVIG.  Chronic pain Her chronic bilateral knee pain is fairly well controlled with oxycodone as needed.    The patient understands the plans discussed today and is in agreement with them.  She knows to contact our office if she develops concerns prior to her next appointment.    [name], NP  [place] [address] [place] [address] Dept: [phone] Dept Fax: [phone]   Orders Placed This Encounter  Procedures   CBC and differential    This external order was created through the Results Console.   CBC    This external order was created through the Results Console.   Basic metabolic panel    This external order was created through the Results Console.   Comprehensive metabolic panel    This external order was created through the Results Console.   Hepatic function panel    This external order was created through the Results Console.      CHIEF COMPLAINT:  CC: A 76-year  old female with history of CLL here for 4 week evaluation  Current Treatment:  [name]:  Samantha Warren is here today for repeat clinical assessment. She denies fevers or chills. She denies pain. Her appetite is good. Her weight has been stable.  I have reviewed the past medical history, past surgical history, social history and family history with the patient and they are unchanged from previous note.  ALLERGIES:  is allergic to metronidazole, rituximab, and sulfa antibiotics.  MEDICATIONS:  Current Outpatient Medications  Medication Sig Dispense Refill   alendronate (FOSAMAX) 70 MG tablet      amLODipine (NORVASC) 2.5 MG tablet Take 2.5 mg by mouth daily.     Ascorbic Acid (VITAMIN C WITH ROSE HIPS) 500 MG tablet Take 500 mg by mouth daily.     benzonatate (TESSALON) 100 MG capsule TAKE ONE CAPSULE BY MOUTH 3 TIMES A DAY AS NEEDED 60 capsule 5   calcium carbonate (OSCAL) 1500 (600 Ca) MG TABS tablet Take by mouth.     Cholecalciferol (VITAMIN D3) 125 MCG (5000 UT) TABS Take 1 tablet by mouth daily.     clotrimazole-betamethasone (LOTRISONE) cream Apply BID x 7 days     diclofenac Sodium (VOLTAREN) 1 % GEL Apply topically.     ibuprofen (ADVIL) 800 MG tablet      KLOR-CON M20 20 MEQ tablet      lidocaine (LIDODERM) 5 % 2 patches daily.     lidocaine-prilocaine (EMLA) cream Apply 1 application topically as needed. 30 g 5  Multiple Vitamin (MULTIVITAMIN WITH MINERALS) TABS tablet Take 1 tablet by mouth daily.     Omega-3 Fatty Acids (FISH OIL) 1000 MG CAPS Take 1 capsule by mouth daily.     omeprazole (PRILOSEC) 20 MG capsule TAKE 1 CAPSULE BY MOUTH EVERY DAY 90 capsule 3   oxyCODONE (ROXICODONE) 15 MG immediate release tablet Take 15 mg by mouth every 6 (six) hours as needed.     prochlorperazine (COMPAZINE) 10 MG tablet TAKE 1 TABLET BY MOUTH EVERY 6 HOURS AS NEEDED FOR NAUSEA OR VOMITING. 90 tablet 1   VENCLEXTA 100 MG tablet Take 4 tablets (400 mg total) by mouth daily.  30 tablet 0   vitamin B-12 (CYANOCOBALAMIN) 100 MCG tablet Take 100 mcg by mouth daily.     Current Facility-Administered Medications  Medication Dose Route Frequency Provider Last Rate Last Admin   sodium chloride flush (NS) 0.9 % injection 10 mL  10 mL Intracatheter PRN Mosher, Vida Roller A, PA-C   10 mL at 10/01/21 5053   Facility-Administered Medications Ordered in Other Visits  Medication Dose Route Frequency Provider Last Rate Last Admin   diphenhydrAMINE (BENADRYL) injection 25 mg  25 mg Intravenous Once Derwood Kaplan, MD       heparin lock flush 100 unit/mL  500 Units Intracatheter Once PRN Derwood Kaplan, MD       influenza vac split quadrivalent PF (FLUARIX) injection 0.5 mL  0.5 mL Intramuscular Once Derwood Kaplan, MD       sodium chloride flush (NS) 0.9 % injection 10 mL  10 mL Intracatheter PRN Mosher, Kelli A, PA-C   10 mL at 09/26/20 0839   sodium chloride flush (NS) 0.9 % injection 10 mL  10 mL Intracatheter Once PRN Derwood Kaplan, MD        HISTORY OF PRESENT ILLNESS:   Oncology History   No history exists.      REVIEW OF SYSTEMS:   Constitutional: Denies fevers, chills or abnormal weight loss Eyes: Denies blurriness of vision Ears, nose, mouth, throat, and face: Denies mucositis or sore throat Respiratory: Denies cough, dyspnea or wheezes Cardiovascular: Denies palpitation, chest discomfort or lower extremity swelling Gastrointestinal:  Denies nausea, heartburn or change in bowel habits Skin: Denies abnormal skin rashes Lymphatics: Denies new lymphadenopathy or easy bruising Neurological:Denies numbness, tingling or new weaknesses Behavioral/Psych: Mood is stable, no new changes  All other systems were reviewed with the patient and are negative.   VITALS:  Blood pressure (!) 144/60, pulse 67, temperature 97.7 F (36.5 C), temperature source Oral, resp. rate 18, height 5' 1.5" (1.562 m), weight 224 lb 4.8 oz (101.7 kg), SpO2 98 %.  Wt  Readings from Last 3 Encounters:  10/01/21 224 lb 12 oz (101.9 kg)  10/01/21 224 lb 4.8 oz (101.7 kg)  09/03/21 222 lb (100.7 kg)    Body mass index is 41.69 kg/m.  Performance status (ECOG): 1 - Symptomatic but completely ambulatory  PHYSICAL EXAM:   GENERAL:alert, no distress and comfortable SKIN: skin color, texture, turgor are normal, no rashes or significant lesions EYES: normal, Conjunctiva are pink and non-injected, sclera clear OROPHARYNX:no exudate, no erythema and lips, buccal mucosa, and tongue normal  NECK: supple, thyroid normal size, non-tender, without nodularity LYMPH:  no palpable lymphadenopathy in the cervical, axillary or inguinal LUNGS: clear to auscultation and percussion with normal breathing effort HEART: regular rate & rhythm and no murmurs and no lower extremity edema ABDOMEN:abdomen soft, non-tender and normal bowel sounds Musculoskeletal:no cyanosis of  digits and no clubbing  NEURO: alert & oriented x 3 with fluent speech, no focal motor/sensory deficits  LABORATORY DATA:  I have reviewed the data as listed    Component Value Date/Time   NA 140 10/01/2021 0000   K 4.0 10/01/2021 0000   CL 108 10/01/2021 0000   CO2 26 (A) 10/01/2021 0000   GLUCOSE 73 07/13/2021 1320   BUN 20 10/01/2021 0000   CREATININE 0.9 10/01/2021 0000   CREATININE 1.00 07/13/2021 1320   CALCIUM 8.1 (A) 10/01/2021 0000   PROT 8.1 07/12/2021 1602   ALBUMIN 3.9 10/01/2021 0000   AST 26 10/01/2021 0000   ALT 26 10/01/2021 0000   ALKPHOS 82 10/01/2021 0000   BILITOT 0.8 07/12/2021 1602   GFRNONAA 59 (L) 07/13/2021 1320    No results found for: "SPEP", "UPEP"  Lab Results  Component Value Date   WBC 3.5 10/01/2021   NEUTROABS 1.44 10/01/2021   HGB 11.3 (A) 10/01/2021   HCT 34 (A) 10/01/2021   MCV 95.1 07/13/2021   PLT 59 (A) 10/01/2021      Chemistry      Component Value Date/Time   NA 140 10/01/2021 0000   K 4.0 10/01/2021 0000   CL 108 10/01/2021 0000   CO2  26 (A) 10/01/2021 0000   BUN 20 10/01/2021 0000   CREATININE 0.9 10/01/2021 0000   CREATININE 1.00 07/13/2021 1320   GLU 94 10/01/2021 0000      Component Value Date/Time   CALCIUM 8.1 (A) 10/01/2021 0000   ALKPHOS 82 10/01/2021 0000   AST 26 10/01/2021 0000   ALT 26 10/01/2021 0000   BILITOT 0.8 07/12/2021 1602       RADIOGRAPHIC STUDIES: I have personally reviewed the radiological images as listed and agreed with the findings in the report. No results found.

## 2021-10-01 NOTE — Assessment & Plan Note (Signed)
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 She did not require treatment until 2008 and has since been through multiple therapies. She is currently takingvenetoclax 400 mg daily with good control of her disease for over 4 years. Her blood counts remain stable. She will return to clinic in 4 weeks for repeat evaluation.

## 2021-10-01 NOTE — Assessment & Plan Note (Signed)
Her chronic bilateral knee pain is fairly well controlled with oxycodone as needed.

## 2021-10-01 NOTE — Assessment & Plan Note (Signed)
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 She wanted to space out her treatments, but we recommended she continue IVIG every 4 weeks through the winter months. She is due for treatment today and will proceed with IVIG.

## 2021-10-01 NOTE — Patient Instructions (Signed)

## 2021-10-26 MED FILL — Immune Globulin (Human) IV Soln 10 GM/100ML: INTRAVENOUS | Qty: 450 | Status: AC

## 2021-10-29 ENCOUNTER — Other Ambulatory Visit: Payer: Self-pay | Admitting: Oncology

## 2021-10-29 ENCOUNTER — Inpatient Hospital Stay (INDEPENDENT_AMBULATORY_CARE_PROVIDER_SITE_OTHER): Payer: Medicare Other | Admitting: Oncology

## 2021-10-29 ENCOUNTER — Inpatient Hospital Stay: Payer: Medicare Other

## 2021-10-29 ENCOUNTER — Encounter: Payer: Self-pay | Admitting: Oncology

## 2021-10-29 ENCOUNTER — Other Ambulatory Visit: Payer: Self-pay

## 2021-10-29 VITALS — BP 160/78 | HR 82 | Temp 97.5°F | Resp 18 | Ht 61.5 in | Wt 226.8 lb

## 2021-10-29 VITALS — BP 120/60 | HR 66 | Temp 97.9°F | Resp 18 | Ht 61.5 in | Wt 226.8 lb

## 2021-10-29 DIAGNOSIS — T451X5A Adverse effect of antineoplastic and immunosuppressive drugs, initial encounter: Secondary | ICD-10-CM

## 2021-10-29 DIAGNOSIS — D61818 Other pancytopenia: Secondary | ICD-10-CM | POA: Diagnosis not present

## 2021-10-29 DIAGNOSIS — C9110 Chronic lymphocytic leukemia of B-cell type not having achieved remission: Secondary | ICD-10-CM

## 2021-10-29 DIAGNOSIS — E876 Hypokalemia: Secondary | ICD-10-CM

## 2021-10-29 DIAGNOSIS — D819 Combined immunodeficiency, unspecified: Secondary | ICD-10-CM | POA: Diagnosis not present

## 2021-10-29 DIAGNOSIS — D801 Nonfamilial hypogammaglobulinemia: Secondary | ICD-10-CM | POA: Diagnosis not present

## 2021-10-29 LAB — HEPATIC FUNCTION PANEL
ALT: 26 U/L (ref 7–35)
AST: 31 (ref 13–35)
Alkaline Phosphatase: 81 (ref 25–125)
Bilirubin, Total: 0.6

## 2021-10-29 LAB — CBC AND DIFFERENTIAL
HCT: 36 (ref 36–46)
Hemoglobin: 11.8 — AB (ref 12.0–16.0)
Neutrophils Absolute: 1.35
Platelets: 58 10*3/uL — AB (ref 150–400)
WBC: 3.3

## 2021-10-29 LAB — BASIC METABOLIC PANEL
BUN: 22 — AB (ref 4–21)
CO2: 23 — AB (ref 13–22)
Chloride: 109 — AB (ref 99–108)
Creatinine: 0.7 (ref 0.5–1.1)
Glucose: 100
Potassium: 4.2 mEq/L (ref 3.5–5.1)
Sodium: 140 (ref 137–147)

## 2021-10-29 LAB — COMPREHENSIVE METABOLIC PANEL
Albumin: 4.1 (ref 3.5–5.0)
Calcium: 8.7 (ref 8.7–10.7)

## 2021-10-29 LAB — CBC: RBC: 3.95 (ref 3.87–5.11)

## 2021-10-29 MED ORDER — ALTEPLASE 2 MG IJ SOLR: 2.0000 mg | Freq: Once | INTRAMUSCULAR | Status: DC

## 2021-10-29 MED ORDER — ALTEPLASE 2 MG IJ SOLR
2.0000 mg | Freq: Once | INTRAMUSCULAR | Status: AC | PRN
  Administered 2021-10-29: 2 mg
  Filled 2021-10-29: qty 2

## 2021-10-29 MED ORDER — ACETAMINOPHEN 325 MG PO TABS
650.0000 mg | ORAL_TABLET | Freq: Once | ORAL | Status: AC
  Administered 2021-10-29: 650 mg via ORAL
  Filled 2021-10-29: qty 2

## 2021-10-29 MED ORDER — BENZONATATE 100 MG PO CAPS: ORAL_CAPSULE | ORAL | 5 refills | Status: DC

## 2021-10-29 MED ORDER — KLOR-CON M20 20 MEQ PO TBCR: 20.0000 meq | EXTENDED_RELEASE_TABLET | Freq: Three times a day (TID) | ORAL | 5 refills | Status: DC

## 2021-10-29 MED ORDER — PROCHLORPERAZINE MALEATE 10 MG PO TABS: 10.0000 mg | ORAL_TABLET | Freq: Four times a day (QID) | ORAL | 1 refills | Status: DC | PRN

## 2021-10-29 MED ORDER — DIPHENHYDRAMINE HCL 50 MG/ML IJ SOLN
25.0000 mg | Freq: Once | INTRAMUSCULAR | Status: AC
  Administered 2021-10-29: 25 mg via INTRAVENOUS
  Filled 2021-10-29: qty 1

## 2021-10-29 MED ORDER — IMMUNE GLOBULIN (HUMAN) 10 GM/100ML IV SOLN
0.9000 g/kg | Freq: Once | INTRAVENOUS | Status: AC
  Administered 2021-10-29: 45 g via INTRAVENOUS
  Filled 2021-10-29: qty 400

## 2021-10-29 MED ORDER — HEPARIN SOD (PORK) LOCK FLUSH 100 UNIT/ML IV SOLN
500.0000 [IU] | Freq: Once | INTRAVENOUS | Status: AC | PRN
  Administered 2021-10-29: 500 [IU]

## 2021-10-29 MED ORDER — SODIUM CHLORIDE 0.9% FLUSH
10.0000 mL | Freq: Once | INTRAVENOUS | Status: AC | PRN
  Administered 2021-10-29: 10 mL

## 2021-10-29 MED ORDER — DEXTROSE 5 % IV SOLN: Freq: Once | INTRAVENOUS | Status: AC

## 2021-10-29 NOTE — Addendum Note (Signed)
Addended by: Juanetta Beets on: 10/29/2021 10:52 AM   Modules accepted: Orders

## 2021-10-29 NOTE — Progress Notes (Signed)
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  She did not require treatment until 2008 and has since been through multiple therapies.  She is currently taking venetoclax 400 mg daily with good control of her disease for over 4 years. Her blood counts remain stable. She will return to clinic in 4 weeks for repeat evaluation.   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  She wanted to space out her treatments, but we recommended she continue IVIG every 4 weeks through the winter months. She is due for treatment today and will proceed with IVIG.   Chronic pain Her chronic bilateral knee pain is fairly well controlled with oxycodone as needed.   Pancytopenia Chronic and stable.    I will refill her Tessalon perles and potassium. She will continue the Venetoclax. She will proceed with her IVIG today. I can see her back in 4 weeks with CBC and CMP. The patient understands the plans discussed today and is in agreement with them.  She knows to contact our office if she develops concerns prior to her next appointment.    Samantha Kaplan, MD  [facility] [address] [place] [address] Dept: [phone] Dept Fax: [phone]   Orders Placed This Encounter  Procedures   CBC and differential    This external order was created through the Results Console.   CBC    This external order was created through the Results Console.   Basic metabolic panel    This external order was created through the Results Console.   Comprehensive  metabolic panel    This external order was created through the Results Console.   Hepatic function panel    This external order was created through the Results Console.      CHIEF COMPLAINT:  CC: [name] with history of CLL here for 4 week evaluation  Current Treatment:  [place]:  Samantha Warren is here today for repeat clinical assessment. She requests refills on several of her medications, and I will do that. She continues to have severe chronic knee pain on the right. She denies fevers or chills. She denies pain. Her appetite is good. Her weight has been stable.  I have reviewed the past medical history, past surgical history, social history and family history with the patient and they are unchanged from previous note.  ALLERGIES:  is allergic to metronidazole, rituximab, and sulfa antibiotics.  MEDICATIONS:  Current Outpatient Medications  Medication Sig Dispense Refill   alendronate (FOSAMAX) 70 MG tablet      amLODipine (NORVASC) 2.5 MG tablet Take 2.5 mg by mouth daily.     Ascorbic Acid (VITAMIN C WITH ROSE HIPS) 500 MG tablet Take 500 mg by mouth daily.     benzonatate (TESSALON) 100 MG capsule TAKE ONE CAPSULE BY MOUTH 3 TIMES A DAY AS NEEDED 60 capsule 5   calcium carbonate (OSCAL) 1500 (600 Ca) MG TABS tablet Take by mouth.     Cholecalciferol (VITAMIN D3) 125 MCG (5000 UT) TABS Take 1 tablet by mouth daily.     clotrimazole-betamethasone (LOTRISONE) cream Apply BID  x 7 days     diclofenac Sodium (VOLTAREN) 1 % GEL Apply topically.     ibuprofen (ADVIL) 800 MG tablet      KLOR-CON M20 20 MEQ tablet Take 1 tablet (20 mEq total) by mouth 3 (three) times daily. 90 tablet 5   lidocaine (LIDODERM) 5 % 2 patches daily.     lidocaine-prilocaine (EMLA) cream Apply 1 application topically as needed. 30 g 5   Multiple Vitamin (MULTIVITAMIN WITH MINERALS) TABS tablet Take 1 tablet by mouth daily.     Omega-3 Fatty Acids (FISH OIL) 1000 MG CAPS Take 1  capsule by mouth daily.     omeprazole (PRILOSEC) 20 MG capsule TAKE 1 CAPSULE BY MOUTH EVERY DAY 90 capsule 3   oxyCODONE (ROXICODONE) 15 MG immediate release tablet Take 15 mg by mouth every 6 (six) hours as needed.     prochlorperazine (COMPAZINE) 10 MG tablet Take 1 tablet (10 mg total) by mouth every 6 (six) hours as needed. 90 tablet 1   VENCLEXTA 100 MG tablet Take 4 tablets (400 mg total) by mouth daily. 30 tablet 0   vitamin B-12 (CYANOCOBALAMIN) 100 MCG tablet Take 100 mcg by mouth daily.     No current facility-administered medications for this visit.   Facility-Administered Medications Ordered in Other Visits  Medication Dose Route Frequency Provider Last Rate Last Admin   influenza vac split quadrivalent PF (FLUARIX) injection 0.5 mL  0.5 mL Intramuscular Once Samantha Kaplan, MD       sodium chloride flush (NS) 0.9 % injection 10 mL  10 mL Intracatheter PRN Mosher, Kelli A, PA-C   10 mL at 09/26/20 0839    HISTORY OF PRESENT ILLNESS:   Oncology History   No history exists.      REVIEW OF SYSTEMS:   Constitutional: Denies fevers, chills or abnormal weight loss Eyes: Denies blurriness of vision Ears, nose, mouth, throat, and face: Denies mucositis or sore throat Respiratory: Denies cough, dyspnea or wheezes Cardiovascular: Denies palpitation, chest discomfort or lower extremity swelling Gastrointestinal:  Denies nausea, heartburn or change in bowel habits Skin: Denies abnormal skin rashes Lymphatics: Denies new lymphadenopathy or easy bruising Neurological:Denies numbness, tingling or new weaknesses Behavioral/Psych: Mood is stable, no new changes  All other systems were reviewed with the patient and are negative.   VITALS:  Blood pressure (!) 160/78, pulse 82, temperature (!) 97.5 F (36.4 C), temperature source Oral, resp. rate 18, height 5' 1.5" (1.562 m), weight 226 lb 12.8 oz (102.9 kg), SpO2 100 %.  Wt Readings from Last 3 Encounters:  10/29/21 226 lb  12.8 oz (102.9 kg)  10/29/21 226 lb 12.8 oz (102.9 kg)  10/01/21 224 lb 12 oz (101.9 kg)    Body mass index is 42.16 kg/m.  Performance status (ECOG): 1 - Symptomatic but completely ambulatory  PHYSICAL EXAM:   GENERAL:alert, no distress and comfortable SKIN: skin color, texture, turgor are normal, no rashes or significant lesions EYES: normal, Conjunctiva are pink and non-injected, sclera clear OROPHARYNX:no exudate, no erythema and lips, buccal mucosa, and tongue normal  NECK: supple, thyroid normal size, non-tender, without nodularity LYMPH:  no palpable lymphadenopathy in the cervical, axillary or inguinal LUNGS: clear to auscultation and percussion with normal breathing effort HEART: regular rate & rhythm and no murmurs and no lower extremity edema ABDOMEN:abdomen soft, non-tender and normal bowel sounds Musculoskeletal:no cyanosis of digits and no clubbing  NEURO: alert & oriented x 3 with fluent speech, no focal motor/sensory deficits  LABORATORY  DATA:  I have reviewed the data as listed    Component Value Date/Time   NA 140 10/29/2021 0000   K 4.2 10/29/2021 0000   CL 109 (A) 10/29/2021 0000   CO2 23 (A) 10/29/2021 0000   GLUCOSE 73 07/13/2021 1320   BUN 22 (A) 10/29/2021 0000   CREATININE 0.7 10/29/2021 0000   CREATININE 1.00 07/13/2021 1320   CALCIUM 8.7 10/29/2021 0000   PROT 8.1 07/12/2021 1602   ALBUMIN 4.1 10/29/2021 0000   AST 31 10/29/2021 0000   ALT 26 10/29/2021 0000   ALKPHOS 81 10/29/2021 0000   BILITOT 0.8 07/12/2021 1602   GFRNONAA 59 (L) 07/13/2021 1320    No results found for: "SPEP", "UPEP"  Lab Results  Component Value Date   WBC 3.3 10/29/2021   NEUTROABS 1.35 10/29/2021   HGB 11.8 (A) 10/29/2021   HCT 36 10/29/2021   MCV 95.1 07/13/2021   PLT 58 (A) 10/29/2021      Chemistry      Component Value Date/Time   NA 140 10/29/2021 0000   K 4.2 10/29/2021 0000   CL 109 (A) 10/29/2021 0000   CO2 23 (A) 10/29/2021 0000   BUN 22 (A)  10/29/2021 0000   CREATININE 0.7 10/29/2021 0000   CREATININE 1.00 07/13/2021 1320   GLU 100 10/29/2021 0000      Component Value Date/Time   CALCIUM 8.7 10/29/2021 0000   ALKPHOS 81 10/29/2021 0000   AST 31 10/29/2021 0000   ALT 26 10/29/2021 0000   BILITOT 0.8 07/12/2021 1602       RADIOGRAPHIC STUDIES: No results found.

## 2021-10-29 NOTE — Patient Instructions (Signed)

## 2021-10-31 ENCOUNTER — Telehealth: Payer: Self-pay

## 2021-10-31 NOTE — Telephone Encounter (Signed)
-----   Message from Derwood Kaplan, MD sent at 10/29/2021 10:59 AM EDT ----- Regarding: call Tell her labs look good, incl potassium and BS 100. Keep doing whatever she is doing with potassium

## 2021-11-23 ENCOUNTER — Encounter: Payer: Self-pay | Admitting: Oncology

## 2021-11-23 MED FILL — Immune Globulin (Human) IV Soln 10 GM/100ML: INTRAVENOUS | Qty: 450 | Status: AC

## 2021-11-24 ENCOUNTER — Encounter: Payer: Self-pay | Admitting: Oncology

## 2021-11-26 ENCOUNTER — Inpatient Hospital Stay: Payer: Medicare Other

## 2021-11-26 ENCOUNTER — Inpatient Hospital Stay: Payer: Medicare Other | Attending: Hematology and Oncology | Admitting: Hematology and Oncology

## 2021-11-26 ENCOUNTER — Encounter: Payer: Self-pay | Admitting: Hematology and Oncology

## 2021-11-26 ENCOUNTER — Encounter: Payer: Self-pay | Admitting: Oncology

## 2021-11-26 VITALS — BP 140/66 | HR 63 | Temp 97.6°F | Resp 18 | Wt 220.0 lb

## 2021-11-26 VITALS — BP 142/81 | HR 68 | Temp 96.5°F | Resp 18 | Ht 61.5 in | Wt 220.2 lb

## 2021-11-26 DIAGNOSIS — D819 Combined immunodeficiency, unspecified: Secondary | ICD-10-CM | POA: Insufficient documentation

## 2021-11-26 DIAGNOSIS — C9110 Chronic lymphocytic leukemia of B-cell type not having achieved remission: Secondary | ICD-10-CM

## 2021-11-26 DIAGNOSIS — D801 Nonfamilial hypogammaglobulinemia: Secondary | ICD-10-CM

## 2021-11-26 LAB — CBC AND DIFFERENTIAL
HCT: 36 (ref 36–46)
Hemoglobin: 11.9 — AB (ref 12.0–16.0)
Neutrophils Absolute: 1.44
Platelets: 60 10*3/uL — AB (ref 150–400)
WBC: 3.9

## 2021-11-26 LAB — BASIC METABOLIC PANEL
BUN: 26 — AB (ref 4–21)
CO2: 26 — AB (ref 13–22)
Chloride: 110 — AB (ref 99–108)
Creatinine: 0.8 (ref 0.5–1.1)
Glucose: 92
Potassium: 4.3 mEq/L (ref 3.5–5.1)
Sodium: 141 (ref 137–147)

## 2021-11-26 LAB — HEPATIC FUNCTION PANEL
ALT: 30 U/L (ref 7–35)
AST: 27 (ref 13–35)
Alkaline Phosphatase: 77 (ref 25–125)
Bilirubin, Total: 0.4

## 2021-11-26 LAB — CBC
MCV: 91 (ref 81–99)
RBC: 3.97 (ref 3.87–5.11)

## 2021-11-26 LAB — COMPREHENSIVE METABOLIC PANEL
Albumin: 4.1 (ref 3.5–5.0)
Calcium: 8.9 (ref 8.7–10.7)

## 2021-11-26 MED ORDER — SODIUM CHLORIDE 0.9% FLUSH
10.0000 mL | INTRAVENOUS | Status: DC | PRN
  Administered 2021-11-26 (×2): 10 mL

## 2021-11-26 MED ORDER — ALTEPLASE 2 MG IJ SOLR: 2.0000 mg | Freq: Once | INTRAMUSCULAR | Status: DC | PRN

## 2021-11-26 MED ORDER — DIPHENHYDRAMINE HCL 50 MG/ML IJ SOLN
25.0000 mg | Freq: Once | INTRAMUSCULAR | Status: AC
  Administered 2021-11-26: 25 mg via INTRAVENOUS
  Filled 2021-11-26: qty 1

## 2021-11-26 MED ORDER — HEPARIN SOD (PORK) LOCK FLUSH 100 UNIT/ML IV SOLN
500.0000 [IU] | Freq: Once | INTRAVENOUS | Status: AC | PRN
  Administered 2021-11-26: 500 [IU]

## 2021-11-26 MED ORDER — IMMUNE GLOBULIN (HUMAN) 10 GM/100ML IV SOLN
0.9000 g/kg | Freq: Once | INTRAVENOUS | Status: AC
  Administered 2021-11-26: 45 g via INTRAVENOUS
  Filled 2021-11-26: qty 450

## 2021-11-26 MED ORDER — DEXTROSE 5 % IV SOLN: Freq: Once | INTRAVENOUS | Status: AC

## 2021-11-26 MED ORDER — SODIUM CHLORIDE 0.9% FLUSH: 10.0000 mL | Freq: Once | INTRAVENOUS | Status: DC | PRN

## 2021-11-26 MED ORDER — ACETAMINOPHEN 325 MG PO TABS
650.0000 mg | ORAL_TABLET | Freq: Once | ORAL | Status: AC
  Administered 2021-11-26: 650 mg via ORAL
  Filled 2021-11-26: qty 2

## 2021-11-26 NOTE — Patient Instructions (Signed)

## 2021-11-26 NOTE — Addendum Note (Signed)
Addended by: Juanetta Beets on: 11/26/2021 08:59 AM   Modules accepted: Orders

## 2021-11-26 NOTE — Assessment & Plan Note (Signed)
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 She did not require treatment until 2008 and has since been through multiple therapies. She is currently takingvenetoclax 400 mg daily with good control of her disease for over 4 years. Her blood counts remain stable. We will plan to see her back in 4 weeks for repeat clinical assessment.

## 2021-11-26 NOTE — Assessment & Plan Note (Signed)
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 She wanted to space out her treatments, but we recommended she continue IVIG every 4 weeks through the winter months. She will proceed with IVIG today.  We will plan to see her back in 4 weeks with a CBC and comprehensive metabolic panel prior to her next IVIG.

## 2021-11-26 NOTE — Progress Notes (Signed)
[name]  [address] 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  She did not require treatment until 2008 and has since been through multiple therapies.  She is currently taking venetoclax 400 mg daily with good control of her disease for over 4 years. Her blood counts remain stable. We will plan to see her back in 4 weeks for repeat clinical assessment.  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  She wanted to space out her treatments, but we recommended she continue IVIG every 4 weeks through the winter months. She will proceed with IVIG today.  We will plan to see her back in 4 weeks with a CBC and comprehensive metabolic panel prior to her next IVIG.    The patient understands the plans discussed today and is in agreement with them.  She knows to contact our office if she develops concerns prior to her next appointment.   I provided 20 minutes of face-to-face time during this encounter and > 50% was spent counseling as documented under my assessment and plan.    [name], PA-C  [facility] AT [facility] [address] [place] [address] Dept: [phone] Dept Fax: [phone]   Orders Placed This Encounter  Procedures   CBC and differential    This external order was created through the Results Console.   CBC    This external order was created through the Results Console.   Basic metabolic panel    This external order was created through the Results Console.   Comprehensive metabolic panel    This external order was created through the Results Console.   Hepatic function panel     This external order was created through the Results Console.   CBC    This order was created through External Result Entry      CHIEF COMPLAINT:  CC: CLL with combined immunodeficiency  Current Treatment: Venetoclax/IVIG  HISTORY OF PRESENT ILLNESS:  Samantha Warren is a 76 y.o. 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  She was on observation until November 2008. She was initially treated with chlorambucil and prednisone, as she refused intravenous chemotherapy.  She had a partial response to this regimen.  By January 2010, she had progressive disease with a white count over 200,000, with associated anemia, splenomegaly, thrombocytopenia, and adenopathy.  She then received 5 cycles of fludarabine with good response.  She did well until January 2012, when she had progression once again.  She had a single dose of bendamustine and rituximab, which kept her disease under control for over a year.  However, she had a severe allergic reaction to the rituximab, so has not received further rituximab.  She was hospitalized after the bendamustine because of severe pancytopenia and required multiple transfusions, so was placed on observation.  In March 2013, she had progression of disease again, so was treated with bendamustine for 6 cycles, at a 50% dose reduction, and once again had a excellent response.  She was on observation until August 2014, then had progression of disease, so was placed on ibrutinib 420 mg daily.  The ibrutinib had kept her disease under fairly good  control, but then she develops severe bilateral lower extremity cellulitis in April 2016, so ibrutinib was placed on hold.  She had persistent cellulitis, as well as  Clostridium difficile colitis in May 2016 requiring hospitalization.  She was readmitted soon after discharge with worsening cellulitis, requiring a prolonged hospitalization.  She was also found to have severe combined immunodeficiency  secondary to her CLL, so began receiving IVIG monthly in June 2016. She eventually had resolution of the cellulitis   As she was largely asymptomatic, we continued observation until February 2017, at which time she had worsening splenomegaly.  Repeat CT imaging in February showed progressive disease, with marked splenomegaly and bulky lymphadenopathy.  She was then placed back on ibrutinib $RemoveBefo'420mg'nAMZwLjCllD$  daily.  The lymphocytosis, anemia and thrombocytopenia slowly improved.  Due to neutropenia, ibrutinib was placed on hold in December 2017.  The ibrutinib was resumed in January 2018, but discontinued in August 2018 due to toxicities, mainly severe abdominal muscle cramping.  She was hospitalized in early September 2018 with urinary tract infection and pancytopenia.  She received multiple red blood cell and platelet transfusions during her stay.  She was admitted again in late September 2018 with perineal cellulitis extending to the lower abdomen and upper thigh, as well as persistent pancytopenia.  CT abdomen and pelvis revealed interval decrease in the abdominal lymphadenopathy, chronic massive splenomegaly with scattered small splenic infarcts, and small left pleural effusion with bibasilar atelectasis.  She required packed red blood cells while hospitalized.  She was discharged home with home health and followed up with the [facility].  We continued to follow her closely, but did not place her on a treatment due to the persistent cellulitis.  She then was admitted in October 2018 with severe hypercalcemia, with a calcium of 15.  She was treated with IV fluids, zoledronic acid and Calcitonin injections.  We continued to monitor her closely.  She had recurrent hypercalcemia, for which she received IV fluids and zolendronic acid as an outpatient.     Due to worsening pancytopenia, she underwent bone marrow biopsy in January 2019.   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  The cellulitis had improved to a point we felt she could be treated again, so she was placed on venetoclax CLL in January 2019.  She has continued on venetoclax 400 mg daily after ramp up dosing and has tolerated that fairly well, except for mild diarrhea.  Since starting venetoclax, she has had decreasing splenomegaly and improvement in her pancytopenia.  She initially continued to require IV fluids and zoledronic acid for the hypercalcemia.  Her last dose of zoledronic acid was given in March 2019.  In March, she had an irregular heart rhythm and EKG revealed occasional premature atrial complexes, as well as an incomplete right bundle branch block.  This was stable compared to EKG done in October 2018.  Quantitative immunoglobulins done in August 2019 revealed a normal IgG, with low IgA and IgM.  She has continued IVIG monthly.  She had an episode of Campylobacter diarrhea in November 2019, which was treated with azithromycin for 7 days with resolution of her diarrhea. Venetoclax was held temporarily until her diarrhea resolved.  She has since tolerated venetoclax without significant difficulty.     She completed a full 2 years of monotherapy with venetoclax in January 2021.  We recommended continuing venetoclax 400 mg daily until progression of disease or unacceptable toxicities.  Annual bilateral mammogram in Apri 2021  revealed a possible mass in the left breast, which warranted further evaluation. Right breast did not reveal any evidence of malignancy.  Unilateral diagnostic left mammogram in May, revealed benign fibrocystic change within the outer left breast with no evidence of malignancy.  Routine annual bilateral mammography was recommended.     In February 2022, she had a right diagnostic mammogram due to right breast subareolar tenderness, which did not reveal any evidence of malignancy.  CT abdomen in April did not reveal any acute abnormality or other findings  to explain the left-sided abdominal pain.  There was resolution of previously seen gross splenomegaly.  There were prominent retroperitoneal lymph nodes, which had significantly diminished in size compared to prior exam.  A hiatal hernia was seen. There was descending colonic diverticulosis without evidence of acute diverticulitis.  Bilateral screening mammogram in May 2022 revealed possible asymmetry in the left breast.  Left diagnostic mammogram and ultrasound in June revealed a cluster of cysts/apocrine cyst in the left breast at 2 o'clock, 3 cm the nipple, measuring 6 x 3 x 6 mm, unchanged from the exam dated May 2021. There were no solid masses or suspicious lesions. She is up to date on colonoscopy with her last colonoscopy being in June 2022 with Dr. Lyda Jester.  This was negative, and repeat in 10 years was advised.    She has continued venetoclax 400 mg daily, as well as IVIG monthly.  Bilateral screening mammogram in May 2023 did not reveal any evidence of malignancy.  INTERVAL HISTORY:  Samantha Warren is here today for repeat clinical assessment prior to IVIG.  She states she had C. difficile earlier this month treated by Dr. Truman Hayward with metronidazole.  She states the diarrhea has resolved. She denies fevers, chills or night sweats. She denies pain. Her appetite is good. Her weight has decreased 6 pounds over last month .    REVIEW OF SYSTEMS:  Review of Systems  Constitutional:  Negative for appetite change, chills, fatigue, fever and unexpected weight change.  HENT:   Negative for lump/mass, mouth sores and sore throat.   Respiratory:  Negative for cough and shortness of breath.   Cardiovascular:  Negative for chest pain and leg swelling.  Gastrointestinal:  Negative for abdominal pain, constipation, diarrhea, nausea and vomiting.  Endocrine: Negative for hot flashes.  Genitourinary:  Negative for difficulty urinating, dysuria, frequency and hematuria.   Musculoskeletal:  Negative for arthralgias,  back pain and myalgias.  Skin:  Negative for rash.  Neurological:  Negative for dizziness and headaches.  Hematological:  Negative for adenopathy. Does not bruise/bleed easily.  Psychiatric/Behavioral:  Negative for depression and sleep disturbance. The patient is not nervous/anxious.     VITALS:  Blood pressure (!) 142/81, pulse 68, temperature (!) 96.5 F (35.8 C), temperature source Tympanic, resp. rate 18, height 5' 1.5" (1.562 m), weight 220 lb 3.2 oz (99.9 kg), SpO2 97 %.  Wt Readings from Last 3 Encounters:  11/26/21 220 lb 3.2 oz (99.9 kg)  10/29/21 226 lb 12.8 oz (102.9 kg)  10/29/21 226 lb 12.8 oz (102.9 kg)    Body mass index is 40.93 kg/m.  Performance status (ECOG): 1 - Symptomatic but completely ambulatory  PHYSICAL EXAM:  Physical Exam Vitals and nursing note reviewed.  Constitutional:      General: She is not in acute distress.    Appearance: Normal appearance.  HENT:     Head: Normocephalic and atraumatic.     Mouth/Throat:     Mouth: Mucous membranes are  moist.     Pharynx: Oropharynx is clear. No oropharyngeal exudate or posterior oropharyngeal erythema.  Eyes:     General: No scleral icterus.    Extraocular Movements: Extraocular movements intact.     Conjunctiva/sclera: Conjunctivae normal.     Pupils: Pupils are equal, round, and reactive to light.  Cardiovascular:     Rate and Rhythm: Normal rate and regular rhythm.     Heart sounds: Normal heart sounds. No murmur heard.    No friction rub. No gallop.  Pulmonary:     Effort: Pulmonary effort is normal.     Breath sounds: Normal breath sounds. No wheezing, rhonchi or rales.  Abdominal:     General: There is no distension.     Palpations: Abdomen is soft. There is no hepatomegaly, splenomegaly or mass.     Tenderness: There is no abdominal tenderness.  Musculoskeletal:        General: Normal range of motion.     Cervical back: Normal range of motion and neck supple. No tenderness.     Right lower  leg: No edema.     Left lower leg: No edema.  Lymphadenopathy:     Cervical: No cervical adenopathy.     Upper Body:     Right upper body: No supraclavicular or axillary adenopathy.     Left upper body: No supraclavicular or axillary adenopathy.     Lower Body: No right inguinal adenopathy. No left inguinal adenopathy.  Skin:    General: Skin is warm and dry.     Coloration: Skin is not jaundiced.     Findings: No rash.  Neurological:     Mental Status: She is alert and oriented to person, place, and time.     Cranial Nerves: No cranial nerve deficit.  Psychiatric:        Mood and Affect: Mood normal.        Behavior: Behavior normal.        Thought Content: Thought content normal.   LABS:      Latest Ref Rng & Units 11/26/2021   12:00 AM 10/29/2021   12:00 AM 10/01/2021   12:00 AM  CBC  WBC  3.9     3.3     3.5      Hemoglobin 12.0 - 16.0 11.9     11.8     11.3      Hematocrit 36 - 46 36     36     34      Platelets 150 - 400 K/uL 60     58     59         This result is from an external source.      Latest Ref Rng & Units 11/26/2021   12:00 AM 10/29/2021   12:00 AM 10/01/2021   12:00 AM  CMP  BUN 4 - _0 Creatinine 0.5 - 1.1 0.8     0.7     0.9      Sodium 137 - 147 141     140     140      Potassium 3.5 - 5.1 mEq/L 4.3     4.2     4.0      Chloride 99 - 108 110     109     108      CO2 13 - 22 26  23     26      Calcium 8.7 - 10.7 8.9     8.7     8.1      Alkaline Phos 25 - 125 77     81     82      AST 13 - 35 _0 ALT 7 - 35 U/L _1 This result is from an external source.     No results found for: "CEA1", "CEA" / No results found for: "CEA1", "CEA" No results found for: "PSA1" No results found for: "TKW409" No results found for: "CAN125"  No results found for: "TOTALPROTELP", "ALBUMINELP", "A1GS", "A2GS", "BETS", "BETA2SER", "GAMS", "MSPIKE", "SPEI" No results found for: "TIBC", "FERRITIN",  "IRONPCTSAT" No results found for: "LDH"  STUDIES:  No results found.    HISTORY:   Past Medical History:  Diagnosis Date   Cancer [facility])     Past Surgical History:  Procedure Laterality Date   arm surgery     GALLBLADDER SURGERY     TOTAL KNEE ARTHROPLASTY      Family History  Problem Relation Age of Onset   Cancer Mother    Hypertension Father    Hypertension Sister    Stroke Sister    Hypertension Brother    Stroke Brother     Social History:  reports that she has been smoking cigarettes. She has a 15.00 pack-year smoking history. She has never used smokeless tobacco. She reports current drug use. No history on file for alcohol use.The patient is alone today.  Allergies:  Allergies  Allergen Reactions   Metronidazole     Other reaction(s): Other (See Comments)   Rituximab     Other reaction(s): Other (See Comments)   Sulfa Antibiotics Rash    Current Medications: Current Outpatient Medications  Medication Sig Dispense Refill   alendronate (FOSAMAX) 70 MG tablet      amLODipine (NORVASC) 2.5 MG tablet Take 2.5 mg by mouth daily.     Ascorbic Acid (VITAMIN C WITH ROSE HIPS) 500 MG tablet Take 500 mg by mouth daily.     benzonatate (TESSALON) 100 MG capsule TAKE ONE CAPSULE BY MOUTH 3 TIMES A DAY AS NEEDED 60 capsule 5   calcium carbonate (OSCAL) 1500 (600 Ca) MG TABS tablet Take by mouth.     Cholecalciferol (VITAMIN D3) 125 MCG (5000 UT) TABS Take 1 tablet by mouth daily.     clotrimazole-betamethasone (LOTRISONE) cream Apply BID x 7 days     diclofenac Sodium (VOLTAREN) 1 % GEL Apply topically.     diphenoxylate-atropine (LOMOTIL) 2.5-0.025 MG tablet Take 1-2 tablets by mouth every 6 (six) hours.     ibuprofen (ADVIL) 800 MG tablet      KLOR-CON M20 20 MEQ tablet Take 1 tablet (20 mEq total) by mouth 3 (three) times daily. 90 tablet 5   lidocaine (LIDODERM) 5 % 2 patches daily.     lidocaine-prilocaine (EMLA) cream Apply 1 application topically as needed.  30 g 5   metroNIDAZOLE (FLAGYL) 500 MG tablet Take 500 mg by mouth 2 (two) times daily. This is on patients allergy list no prior reaction noted per patient , she has taken all but a few tablets and voiced no previous allegy that she is aware of.provider notified.     Multiple Vitamin (  MULTIVITAMIN WITH MINERALS) TABS tablet Take 1 tablet by mouth daily.     Omega-3 Fatty Acids (FISH OIL) 1000 MG CAPS Take 1 capsule by mouth daily.     omeprazole (PRILOSEC) 20 MG capsule TAKE 1 CAPSULE BY MOUTH EVERY DAY 90 capsule 3   oxyCODONE (ROXICODONE) 15 MG immediate release tablet Take 15 mg by mouth every 6 (six) hours as needed.     prochlorperazine (COMPAZINE) 10 MG tablet Take 1 tablet (10 mg total) by mouth every 6 (six) hours as needed. 90 tablet 1   VENCLEXTA 100 MG tablet Take 4 tablets (400 mg total) by mouth daily. 30 tablet 0   vitamin B-12 (CYANOCOBALAMIN) 100 MCG tablet Take 100 mcg by mouth daily.     Current Facility-Administered Medications  Medication Dose Route Frequency Provider Last Rate Last Admin   alteplase (CATHFLO ACTIVASE) injection 2 mg  2 mg Intracatheter Once PRN Derwood Kaplan, MD       heparin lock flush 100 unit/mL  500 Units Intracatheter Once PRN Derwood Kaplan, MD       sodium chloride flush (NS) 0.9 % injection 10 mL  10 mL Intracatheter Once PRN Derwood Kaplan, MD       sodium chloride flush (NS) 0.9 % injection 10 mL  10 mL Intracatheter PRN Derwood Kaplan, MD   10 mL at 11/26/21 0850   Facility-Administered Medications Ordered in Other Visits  Medication Dose Route Frequency Provider Last Rate Last Admin   influenza vac split quadrivalent PF (FLUARIX) injection 0.5 mL  0.5 mL Intramuscular Once Derwood Kaplan, MD       sodium chloride flush (NS) 0.9 % injection 10 mL  10 mL Intracatheter PRN Rosanne Sack A, PA-C   10 mL at 09/26/20 [phone]

## 2021-11-27 ENCOUNTER — Encounter: Payer: Self-pay | Admitting: Oncology

## 2021-12-21 MED FILL — Immune Globulin (Human) IV Soln 10 GM/100ML: INTRAVENOUS | Qty: 450 | Status: AC

## 2021-12-23 NOTE — Progress Notes (Signed)
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  She did not require treatment until 2008 and has since been through multiple therapies.  She is currently taking venetoclax 400 mg daily with good control of her disease for over 4 years. Her blood counts remain stable.    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  She wanted to space out her treatments, but we recommended she continue IVIG every 4 weeks through the winter months. She will proceed with IVIG today.     We will plan to see her back in 4 weeks with a CBC and comprehensive metabolic panel prior to her next IVIG.  After the winter months, we could consider spacing out her appointments to every 2-3 months. The patient understands the plans discussed today and is in agreement with them.  She knows to contact our office if she develops concerns prior to her next appointment.   I provided 20 minutes of face-to-face time during this encounter and > 50% was spent counseling as documented under my assessment and plan.    Derwood Kaplan, MD  [facility] AT [facility] [address] [place] [address] Dept: [phone] Dept Fax: [phone]   Orders Placed This Encounter  Procedures   CBC and differential    This external order was created through the Results Console.   CBC    This external order was created through the Results Console.   Basic metabolic panel    This external order was created through the Results Console.   Comprehensive metabolic panel    This external order was created through the Results Console.    Hepatic function panel    This external order was created through the Results Console.      CHIEF COMPLAINT:  CC: CLL with combined immunodeficiency  Current Treatment: Venetoclax/IVIG  HISTORY OF PRESENT ILLNESS:  Samantha Warren is a 76 y.o. 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  She was on observation until November 2008. She was initially treated with chlorambucil and prednisone, as she refused intravenous chemotherapy.  She had a partial response to this regimen.  By January 2010, she had progressive disease with a white count over 200,000, with associated anemia, splenomegaly, thrombocytopenia, and adenopathy.  She then received 5 cycles of fludarabine with good response.  She did well until January 2012, when she had progression once again.  She had a single dose of bendamustine and rituximab, which kept her disease under control for over a year.  However, she had a severe allergic reaction to the rituximab, so has not received further rituximab.  She was hospitalized after the bendamustine because of severe pancytopenia and required multiple transfusions, so was placed on observation.  In March 2013, she had progression of disease again, so was treated with bendamustine for 6 cycles, at a 50% dose reduction, and once again had a excellent response.  She was on observation until August 2014, then had progression of disease, so was placed on ibrutinib 420 mg daily.  The ibrutinib had kept her disease under fairly good control, but then she develops severe bilateral lower extremity cellulitis  in April 2016, so ibrutinib was placed on hold.  She had persistent cellulitis, as well as  Clostridium difficile colitis in May 2016 requiring hospitalization.  She was readmitted soon after discharge with worsening cellulitis, requiring a prolonged hospitalization.  She was also found to have severe combined immunodeficiency secondary to her CLL, so began  receiving IVIG monthly in June 2016. She eventually had resolution of the cellulitis   As she was largely asymptomatic, we continued observation until February 2017, at which time she had worsening splenomegaly.  Repeat CT imaging in February showed progressive disease, with marked splenomegaly and bulky lymphadenopathy.  She was then placed back on ibrutinib 490m daily.  The lymphocytosis, anemia and thrombocytopenia slowly improved.  Due to neutropenia, ibrutinib was placed on hold in December 2017.  The ibrutinib was resumed in January 2018, but discontinued in August 2018 due to toxicities, mainly severe abdominal muscle cramping.  She was hospitalized in early September 2018 with urinary tract infection and pancytopenia.  She received multiple red blood cell and platelet transfusions during her stay.  She was admitted again in late September 2018 with perineal cellulitis extending to the lower abdomen and upper thigh, as well as persistent pancytopenia.  CT abdomen and pelvis revealed interval decrease in the abdominal lymphadenopathy, chronic massive splenomegaly with scattered small splenic infarcts, and small left pleural effusion with bibasilar atelectasis.  She required packed red blood cells while hospitalized.  She was discharged home with home health and followed up with the [facility]  We continued to follow her closely, but did not place her on a treatment due to the persistent cellulitis.  She then was admitted in October 2018 with severe hypercalcemia, with a calcium of 15.  She was treated with IV fluids, zoledronic acid and Calcitonin injections.  We continued to monitor her closely.  She had recurrent hypercalcemia, for which she received IV fluids and zolendronic acid as an outpatient.     Due to worsening pancytopenia, she underwent bone marrow biopsy in January 2019.   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   The cellulitis had improved to a point we felt she could be treated again, so she was placed on venetoclax CLL in January 2019.  She has continued on venetoclax 400 mg daily after ramp up dosing and has tolerated that fairly well, except for mild diarrhea.  Since starting venetoclax, she has had decreasing splenomegaly and improvement in her pancytopenia.  She initially continued to require IV fluids and zoledronic acid for the hypercalcemia.  Her last dose of zoledronic acid was given in March 2019.  In March, she had an irregular heart rhythm and EKG revealed occasional premature atrial complexes, as well as an incomplete right bundle branch block.  This was stable compared to EKG done in October 2018.  Quantitative immunoglobulins done in August 2019 revealed a normal IgG, with low IgA and IgM.  She has continued IVIG monthly.  She had an episode of Campylobacter diarrhea in November 2019, which was treated with azithromycin for 7 days with resolution of her diarrhea. Venetoclax was held temporarily until her diarrhea resolved.  She has since tolerated venetoclax without significant difficulty.     In February 2022, she had a right diagnostic mammogram due to right breast subareolar tenderness, which did not reveal any evidence of malignancy.  CT abdomen in April did not reveal any acute abnormality or other findings to explain the left-sided abdominal pain.  There  was resolution of previously seen gross splenomegaly.  There were prominent retroperitoneal lymph nodes, which had significantly diminished in size compared to prior exam.  A hiatal hernia was seen. There was descending colonic diverticulosis without evidence of acute diverticulitis.  Bilateral screening mammogram in May 2022 revealed possible asymmetry in the left breast.  Left diagnostic mammogram and ultrasound in June revealed a cluster of cysts/apocrine cyst in the left breast at 2 o'clock, 3 cm the nipple, measuring 6 x 3 x 6 mm, unchanged from  the exam dated May 2021. There were no solid masses or suspicious lesions. She is up to date on colonoscopy with her last colonoscopy being in June 2022 with Dr. Lyda Jester.  This was negative, and repeat in 10 years was advised.    INTERVAL HISTORY:  Deerica is here today for repeat clinical assessment prior to IVIG. She has continued venetoclax 400 mg daily, as well as IVIG monthly.  Bilateral screening mammogram in May 2023 did not reveal any evidence of malignancy. She states she had C. Difficile last month treated by Dr. Truman Hayward with metronidazole.  She states the diarrhea has resolved. She denies fevers, chills or night sweats. She denies pain. Her appetite is good. Her weight has increased 1 pound over the last month.  REVIEW OF SYSTEMS:  Review of Systems  Constitutional:  Negative for appetite change, chills, fatigue, fever and unexpected weight change.  HENT:   Negative for lump/mass, mouth sores and sore throat.   Respiratory:  Negative for cough and shortness of breath.   Cardiovascular:  Negative for chest pain and leg swelling.  Gastrointestinal:  Negative for abdominal pain, constipation, diarrhea, nausea and vomiting.  Endocrine: Negative for hot flashes.  Genitourinary:  Negative for difficulty urinating, dysuria, frequency and hematuria.   Musculoskeletal:  Negative for arthralgias, back pain and myalgias.  Skin:  Negative for rash.  Neurological:  Negative for dizziness and headaches.  Hematological:  Negative for adenopathy. Does not bruise/bleed easily.  Psychiatric/Behavioral:  Negative for depression and sleep disturbance. The patient is not nervous/anxious.      VITALS:  Blood pressure 132/69, pulse 75, temperature 98.4 F (36.9 C), temperature source Oral, resp. rate 18, height 5' 1.5" (1.562 m), weight 221 lb (100.2 kg), SpO2 99 %.  Wt Readings from Last 3 Encounters:  12/24/21 221 lb 8 oz (100.5 kg)  12/24/21 221 lb (100.2 kg)  11/26/21 220 lb (99.8 kg)    Body mass  index is 41.08 kg/m.  Performance status (ECOG): 1 - Symptomatic but completely ambulatory  PHYSICAL EXAM:  Physical Exam Vitals and nursing note reviewed.  Constitutional:      General: She is not in acute distress.    Appearance: Normal appearance.  HENT:     Head: Normocephalic and atraumatic.     Mouth/Throat:     Mouth: Mucous membranes are moist.     Pharynx: Oropharynx is clear. No oropharyngeal exudate or posterior oropharyngeal erythema.  Eyes:     General: No scleral icterus.    Extraocular Movements: Extraocular movements intact.     Conjunctiva/sclera: Conjunctivae normal.     Pupils: Pupils are equal, round, and reactive to light.  Cardiovascular:     Rate and Rhythm: Normal rate and regular rhythm.     Heart sounds: Normal heart sounds. No murmur heard.    No friction rub. No gallop.  Pulmonary:     Effort: Pulmonary effort is normal.     Breath sounds: Normal breath sounds. No wheezing,  rhonchi or rales.  Abdominal:     General: There is no distension.     Palpations: Abdomen is soft. There is no hepatomegaly, splenomegaly or mass.     Tenderness: There is no abdominal tenderness.  Musculoskeletal:        General: Normal range of motion.     Cervical back: Normal range of motion and neck supple. No tenderness.     Right lower leg: No edema.     Left lower leg: No edema.  Lymphadenopathy:     Cervical: No cervical adenopathy.     Upper Body:     Right upper body: No supraclavicular or axillary adenopathy.     Left upper body: No supraclavicular or axillary adenopathy.     Lower Body: No right inguinal adenopathy. No left inguinal adenopathy.  Skin:    General: Skin is warm and dry.     Coloration: Skin is not jaundiced.     Findings: No rash.  Neurological:     Mental Status: She is alert and oriented to person, place, and time.     Cranial Nerves: No cranial nerve deficit.  Psychiatric:        Mood and Affect: Mood normal.        Behavior: Behavior  normal.        Thought Content: Thought content normal.    LABS:      Latest Ref Rng & Units 12/24/2021   12:00 AM 11/26/2021   12:00 AM 10/29/2021   12:00 AM  CBC  WBC  4.3     3.9     3.3      Hemoglobin 12.0 - 16.0 12.1     11.9     11.8      Hematocrit 36 - 46 36     36     36      Platelets 150 - 400 K/uL 57     60     58         This result is from an external source.      Latest Ref Rng & Units 12/24/2021   12:00 AM 11/26/2021   12:00 AM 10/29/2021   12:00 AM  CMP  BUN 4 - _0 Creatinine 0.5 - 1.1 0.8     0.8     0.7      Sodium 137 - 147 139     141     140      Potassium 3.5 - 5.1 mEq/L 3.8     4.3     4.2      Chloride 99 - 108 111     110     109      CO2 13 - _1 Calcium 8.7 - 10.7 8.6     8.9     8.7      Alkaline Phos 25 - 125 79     77     81      AST 13 - 35 _2 ALT 7 - 35 U/L _3 This result is from an external source.     No results  found for: "CEA1", "CEA" / No results found for: "CEA1", "CEA" No results found for: "PSA1" No results found for: "ZSW109" No results found for: "CAN125"  No results found for: "TOTALPROTELP", "ALBUMINELP", "A1GS", "A2GS", "BETS", "BETA2SER", "GAMS", "MSPIKE", "SPEI" No results found for: "TIBC", "FERRITIN", "IRONPCTSAT" No results found for: "LDH"  STUDIES:  No results found.    HISTORY:   Past Medical History:  Diagnosis Date   Cancer [facility])     Past Surgical History:  Procedure Laterality Date   arm surgery     GALLBLADDER SURGERY     TOTAL KNEE ARTHROPLASTY      Family History  Problem Relation Age of Onset   Cancer Mother    Hypertension Father    Hypertension Sister    Stroke Sister    Hypertension Brother    Stroke Brother     Social History:  reports that she has been smoking cigarettes. She has a 15.00 pack-year smoking history. She has never used smokeless tobacco. She reports current drug use. No history on file  for alcohol use.The patient is alone today.  Allergies:  Allergies  Allergen Reactions   Metronidazole     Other reaction(s): Other (See Comments)   Rituximab     Other reaction(s): Other (See Comments)   Sulfa Antibiotics Rash    Current Medications: Current Outpatient Medications  Medication Sig Dispense Refill   alendronate (FOSAMAX) 70 MG tablet      amLODipine (NORVASC) 2.5 MG tablet Take 2.5 mg by mouth daily.     Ascorbic Acid (VITAMIN C WITH ROSE HIPS) 500 MG tablet Take 500 mg by mouth daily.     benzonatate (TESSALON) 100 MG capsule TAKE ONE CAPSULE BY MOUTH 3 TIMES A DAY AS NEEDED 60 capsule 5   calcium carbonate (OSCAL) 1500 (600 Ca) MG TABS tablet Take by mouth.     Cholecalciferol (VITAMIN D3) 125 MCG (5000 UT) TABS Take 1 tablet by mouth daily.     clotrimazole-betamethasone (LOTRISONE) cream Apply BID x 7 days     diclofenac Sodium (VOLTAREN) 1 % GEL Apply topically.     diphenoxylate-atropine (LOMOTIL) 2.5-0.025 MG tablet Take 1-2 tablets by mouth every 6 (six) hours.     ibuprofen (ADVIL) 800 MG tablet      KLOR-CON M20 20 MEQ tablet Take 1 tablet (20 mEq total) by mouth 3 (three) times daily. 90 tablet 5   lidocaine (LIDODERM) 5 % 2 patches daily.     lidocaine-prilocaine (EMLA) cream Apply 1 Application topically as needed. 30 g 5   metroNIDAZOLE (FLAGYL) 500 MG tablet Take 500 mg by mouth 2 (two) times daily. This is on patients allergy list no prior reaction noted per patient , she has taken all but a few tablets and voiced no previous allegy that she is aware of.provider notified.     Multiple Vitamin (MULTIVITAMIN WITH MINERALS) TABS tablet Take 1 tablet by mouth daily.     Omega-3 Fatty Acids (FISH OIL) 1000 MG CAPS Take 1 capsule by mouth daily.     omeprazole (PRILOSEC) 20 MG capsule TAKE 1 CAPSULE BY MOUTH EVERY DAY 90 capsule 3   oxyCODONE (ROXICODONE) 15 MG immediate release tablet Take 15 mg by mouth every 6 (six) hours as needed.     prochlorperazine  (COMPAZINE) 10 MG tablet Take 1 tablet (10 mg total) by mouth every 6 (six) hours as needed. 90 tablet 1   VENCLEXTA 100 MG tablet Take 4 tablets (400 mg total) by mouth daily.  30 tablet 0   vitamin B-12 (CYANOCOBALAMIN) 100 MCG tablet Take 100 mcg by mouth daily.     No current facility-administered medications for this visit.   Facility-Administered Medications Ordered in Other Visits  Medication Dose Route Frequency Provider Last Rate Last Admin   influenza vac split quadrivalent PF (FLUARIX) injection 0.5 mL  0.5 mL Intramuscular Once Derwood Kaplan, MD       sodium chloride flush (NS) 0.9 % injection 10 mL  10 mL Intracatheter PRN Mosher, Kelli A, PA-C   10 mL at 09/26/20 [phone]

## 2021-12-24 ENCOUNTER — Inpatient Hospital Stay: Payer: Medicare Other | Attending: Hematology and Oncology

## 2021-12-24 ENCOUNTER — Other Ambulatory Visit: Payer: Self-pay | Admitting: Oncology

## 2021-12-24 ENCOUNTER — Inpatient Hospital Stay (INDEPENDENT_AMBULATORY_CARE_PROVIDER_SITE_OTHER): Payer: Medicare Other | Admitting: Oncology

## 2021-12-24 ENCOUNTER — Inpatient Hospital Stay: Payer: Medicare Other

## 2021-12-24 ENCOUNTER — Encounter: Payer: Self-pay | Admitting: Oncology

## 2021-12-24 VITALS — BP 132/69 | HR 75 | Temp 98.4°F | Resp 18 | Ht 61.5 in | Wt 221.0 lb

## 2021-12-24 VITALS — BP 137/65 | HR 68 | Temp 98.1°F | Resp 18 | Ht 61.5 in | Wt 221.5 lb

## 2021-12-24 DIAGNOSIS — D61818 Other pancytopenia: Secondary | ICD-10-CM | POA: Diagnosis not present

## 2021-12-24 DIAGNOSIS — D801 Nonfamilial hypogammaglobulinemia: Secondary | ICD-10-CM

## 2021-12-24 DIAGNOSIS — D819 Combined immunodeficiency, unspecified: Secondary | ICD-10-CM | POA: Diagnosis present

## 2021-12-24 DIAGNOSIS — C9110 Chronic lymphocytic leukemia of B-cell type not having achieved remission: Secondary | ICD-10-CM | POA: Diagnosis not present

## 2021-12-24 LAB — BASIC METABOLIC PANEL
BUN: 17 (ref 4–21)
CO2: 24 — AB (ref 13–22)
Chloride: 111 — AB (ref 99–108)
Creatinine: 0.8 (ref 0.5–1.1)
Glucose: 102
Potassium: 3.8 mEq/L (ref 3.5–5.1)
Sodium: 139 (ref 137–147)

## 2021-12-24 LAB — HEPATIC FUNCTION PANEL
ALT: 28 U/L (ref 7–35)
AST: 27 (ref 13–35)
Alkaline Phosphatase: 79 (ref 25–125)
Bilirubin, Total: 0.5

## 2021-12-24 LAB — CBC AND DIFFERENTIAL
HCT: 36 (ref 36–46)
Hemoglobin: 12.1 (ref 12.0–16.0)
Neutrophils Absolute: 1.42
Platelets: 57 10*3/uL — AB (ref 150–400)
WBC: 4.3

## 2021-12-24 LAB — CBC: RBC: 4.01 (ref 3.87–5.11)

## 2021-12-24 LAB — COMPREHENSIVE METABOLIC PANEL
Albumin: 4.1 (ref 3.5–5.0)
Calcium: 8.6 — AB (ref 8.7–10.7)

## 2021-12-24 MED ORDER — DIPHENHYDRAMINE HCL 50 MG/ML IJ SOLN
25.0000 mg | Freq: Once | INTRAMUSCULAR | Status: AC
  Administered 2021-12-24: 25 mg via INTRAVENOUS
  Filled 2021-12-24: qty 1

## 2021-12-24 MED ORDER — ACETAMINOPHEN 325 MG PO TABS
650.0000 mg | ORAL_TABLET | Freq: Once | ORAL | Status: AC
  Administered 2021-12-24: 650 mg via ORAL
  Filled 2021-12-24: qty 2

## 2021-12-24 MED ORDER — SODIUM CHLORIDE 0.9% FLUSH
10.0000 mL | INTRAVENOUS | Status: DC | PRN
  Administered 2021-12-24: 10 mL

## 2021-12-24 MED ORDER — IMMUNE GLOBULIN (HUMAN) 10 GM/100ML IV SOLN
0.9000 g/kg | Freq: Once | INTRAVENOUS | Status: AC
  Administered 2021-12-24: 45 g via INTRAVENOUS
  Filled 2021-12-24: qty 50

## 2021-12-24 MED ORDER — DEXTROSE 5 % IV SOLN: Freq: Once | INTRAVENOUS | Status: AC

## 2021-12-24 MED ORDER — LIDOCAINE-PRILOCAINE 2.5-2.5 % EX CREA: 1.0000 | TOPICAL_CREAM | CUTANEOUS | 5 refills | Status: DC | PRN

## 2021-12-24 MED ORDER — HEPARIN SOD (PORK) LOCK FLUSH 100 UNIT/ML IV SOLN
500.0000 [IU] | Freq: Once | INTRAVENOUS | Status: AC | PRN
  Administered 2021-12-24: 500 [IU]

## 2021-12-24 NOTE — Addendum Note (Signed)
Addended by: Juanetta Beets on: 12/24/2021 01:14 PM   Modules accepted: Orders

## 2021-12-24 NOTE — Patient Instructions (Signed)

## 2022-01-15 ENCOUNTER — Encounter: Payer: Self-pay | Admitting: Oncology

## 2022-01-21 MED FILL — Immune Globulin (Human) IV Soln 10 GM/100ML: INTRAVENOUS | Qty: 450 | Status: AC

## 2022-01-22 ENCOUNTER — Inpatient Hospital Stay: Payer: Medicare Other

## 2022-01-22 ENCOUNTER — Inpatient Hospital Stay: Payer: Medicare Other | Attending: Hematology and Oncology

## 2022-01-22 ENCOUNTER — Inpatient Hospital Stay (INDEPENDENT_AMBULATORY_CARE_PROVIDER_SITE_OTHER): Payer: Medicare Other | Admitting: Hematology and Oncology

## 2022-01-22 ENCOUNTER — Encounter: Payer: Self-pay | Admitting: Hematology and Oncology

## 2022-01-22 ENCOUNTER — Other Ambulatory Visit: Payer: Self-pay | Admitting: Pharmacist

## 2022-01-22 VITALS — BP 140/70 | HR 77 | Temp 98.3°F | Resp 18 | Ht 61.5 in | Wt 220.6 lb

## 2022-01-22 VITALS — BP 125/58 | HR 67 | Temp 97.7°F | Resp 18

## 2022-01-22 DIAGNOSIS — Z23 Encounter for immunization: Secondary | ICD-10-CM | POA: Insufficient documentation

## 2022-01-22 DIAGNOSIS — Z79899 Other long term (current) drug therapy: Secondary | ICD-10-CM | POA: Insufficient documentation

## 2022-01-22 DIAGNOSIS — Z452 Encounter for adjustment and management of vascular access device: Secondary | ICD-10-CM | POA: Insufficient documentation

## 2022-01-22 DIAGNOSIS — D801 Nonfamilial hypogammaglobulinemia: Secondary | ICD-10-CM

## 2022-01-22 DIAGNOSIS — D819 Combined immunodeficiency, unspecified: Secondary | ICD-10-CM

## 2022-01-22 DIAGNOSIS — C9110 Chronic lymphocytic leukemia of B-cell type not having achieved remission: Secondary | ICD-10-CM | POA: Diagnosis not present

## 2022-01-22 LAB — BASIC METABOLIC PANEL
BUN: 20 (ref 4–21)
CO2: 23 — AB (ref 13–22)
Chloride: 108 (ref 99–108)
Creatinine: 1 (ref 0.5–1.1)
Glucose: 125
Potassium: 4.1 mEq/L (ref 3.5–5.1)
Sodium: 140 (ref 137–147)

## 2022-01-22 LAB — CBC AND DIFFERENTIAL
HCT: 37 (ref 36–46)
Hemoglobin: 12.6 (ref 12.0–16.0)
Neutrophils Absolute: 1.4
Platelets: 48 10*3/uL — AB (ref 150–400)
WBC: 4

## 2022-01-22 LAB — HEPATIC FUNCTION PANEL
ALT: 29 U/L (ref 7–35)
AST: 33 (ref 13–35)
Alkaline Phosphatase: 64 (ref 25–125)
Bilirubin, Total: 0.6

## 2022-01-22 LAB — CBC: RBC: 4.09 (ref 3.87–5.11)

## 2022-01-22 LAB — COMPREHENSIVE METABOLIC PANEL
Albumin: 4.2 (ref 3.5–5.0)
Calcium: 8.9 (ref 8.7–10.7)

## 2022-01-22 MED ORDER — DIPHENHYDRAMINE HCL 50 MG/ML IJ SOLN
25.0000 mg | Freq: Once | INTRAMUSCULAR | Status: AC
  Administered 2022-01-22: 25 mg via INTRAVENOUS
  Filled 2022-01-22: qty 1

## 2022-01-22 MED ORDER — INFLUENZA VAC SPLIT QUAD 0.5 ML IM SUSY
0.5000 mL | PREFILLED_SYRINGE | Freq: Once | INTRAMUSCULAR | Status: AC
  Administered 2022-01-22: 0.5 mL via INTRAMUSCULAR
  Filled 2022-01-22: qty 0.5

## 2022-01-22 MED ORDER — ALTEPLASE 2 MG IJ SOLR
2.0000 mg | Freq: Once | INTRAMUSCULAR | Status: AC | PRN
  Administered 2022-01-22: 2 mg
  Filled 2022-01-22: qty 2

## 2022-01-22 MED ORDER — ACETAMINOPHEN 325 MG PO TABS
650.0000 mg | ORAL_TABLET | Freq: Once | ORAL | Status: AC
  Administered 2022-01-22: 650 mg via ORAL
  Filled 2022-01-22: qty 2

## 2022-01-22 MED ORDER — SODIUM CHLORIDE 0.9% FLUSH
10.0000 mL | Freq: Once | INTRAVENOUS | Status: AC | PRN
  Administered 2022-01-22: 10 mL

## 2022-01-22 MED ORDER — IMMUNE GLOBULIN (HUMAN) 10 GM/100ML IV SOLN
0.9000 g/kg | Freq: Once | INTRAVENOUS | Status: AC
  Administered 2022-01-22: 45 g via INTRAVENOUS
  Filled 2022-01-22: qty 50

## 2022-01-22 MED ORDER — HEPARIN SOD (PORK) LOCK FLUSH 100 UNIT/ML IV SOLN
500.0000 [IU] | Freq: Once | INTRAVENOUS | Status: AC | PRN
  Administered 2022-01-22: 500 [IU]

## 2022-01-22 MED ORDER — DEXTROSE 5 % IV SOLN: Freq: Once | INTRAVENOUS | Status: AC

## 2022-01-22 MED ORDER — SODIUM CHLORIDE 0.9% FLUSH
10.0000 mL | INTRAVENOUS | Status: DC | PRN
  Administered 2022-01-22: 10 mL

## 2022-01-22 NOTE — Progress Notes (Signed)
0955 ALTEPLASE PORT WITH NO RESULTS AFTER 30 MINUTES. 1030 ALTEPLASE PORT AGAIN WITH POSITIVE RESULTS AT 1045. WASTED 10 CC BLOOD AND FLUSHED PORT WITH 10 CC NORMAL SALINE. STARTED INFUSION FOR THE IVIG TODAY.

## 2022-01-22 NOTE — Patient Instructions (Signed)
Immune Globulin Injection What is this medication? IMMUNE GLOBULIN (im MUNE  GLOB yoo lin) helps to prevent or reduce the severity of certain infections in patients who are at risk. This medicine is collected from the pooled blood of many donors. It is used to treat immune system problems, thrombocytopenia, and Kawasaki syndrome. This medicine may be used for other purposes; ask your health care provider or pharmacist if you have questions. This medicine may be used for other purposes; ask your health care provider or pharmacist if you have questions. COMMON BRAND NAME(S): ASCENIV, Baygam, BIVIGAM, Carimune, Carimune NF, cutaquig, Cuvitru, Flebogamma, Flebogamma DIF, GamaSTAN, GamaSTAN S/D, Gamimune N, Gammagard, Gammagard S/D, Gammaked, Gammaplex, Gammar-P IV, Gamunex, Gamunex-C, Hizentra, Iveegam, Iveegam EN, Octagam, Panglobulin, Panglobulin NF, panzyga, Polygam S/D, Privigen, Sandoglobulin, Venoglobulin-S, Vigam, Vivaglobulin, Xembify What should I tell my care team before I take this medication? They need to know if you have any of these conditions: diabetes extremely low or no immune antibodies in the blood heart disease history of blood clots hyperprolinemia infection in the blood, sepsis kidney disease recently received or scheduled to receive a vaccination an unusual or allergic reaction to human immune globulin, albumin, maltose, sucrose, other medicines, foods, dyes, or preservatives pregnant or trying to get pregnant breast-feeding How should I use this medication? This medicine is for injection into a muscle or infusion into a vein or skin. It is usually given by a health care professional in a hospital or clinic setting. In rare cases, some brands of this medicine might be given at home. You will be taught how to give this medicine. Use exactly as directed. Take your medicine at regular intervals. Do not take your medicine more often than directed. Talk to your pediatrician  regarding the use of this medicine in children. While this drug may be prescribed for selected conditions, precautions do apply. Overdosage: If you think you have taken too much of this medicine contact a poison control center or emergency room at once. NOTE: This medicine is only for you. Do not share this medicine with others. Overdosage: If you think you have taken too much of this medicine contact a poison control center or emergency room at once. NOTE: This medicine is only for you. Do not share this medicine with others. What if I miss a dose? It is important not to miss your dose. Call your doctor or health care professional if you are unable to keep an appointment. If you give yourself the medicine and you miss a dose, take it as soon as you can. If it is almost time for your next dose, take only that dose. Do not take double or extra doses. What may interact with this medication? aspirin and aspirin-like medicines cisplatin cyclosporine medicines for infection like acyclovir, adefovir, amphotericin B, bacitracin, cidofovir, foscarnet, ganciclovir, gentamicin, pentamidine, vancomycin NSAIDS, medicines for pain and inflammation, like ibuprofen or naproxen pamidronate vaccines zoledronic acid This list may not describe all possible interactions. Give your health care provider a list of all the medicines, herbs, non-prescription drugs, or dietary supplements you use. Also tell them if you smoke, drink alcohol, or use illegal drugs. Some items may interact with your medicine. This list may not describe all possible interactions. Give your health care provider a list of all the medicines, herbs, non-prescription drugs, or dietary supplements you use. Also tell them if you smoke, drink alcohol, or use illegal drugs. Some items may interact with your medicine. What should I watch for while using this medication?   Your condition will be monitored carefully while you are receiving this  medicine. This medicine is made from pooled blood donations of many different people. It may be possible to pass an infection in this medicine. However, the donors are screened for infections and all products are tested for HIV and hepatitis. The medicine is treated to kill most or all bacteria and viruses. Talk to your doctor about the risks and benefits of this medicine. Do not have vaccinations for at least 14 days before, or until at least 3 months after receiving this medicine. What side effects may I notice from receiving this medication? Side effects that you should report to your doctor or health care professional as soon as possible: allergic reactions like skin rash, itching or hives, swelling of the face, lips, or tongue blue colored lips or skin breathing problems chest pain or tightness fever signs and symptoms of aseptic meningitis such as stiff neck; sensitivity to light; headache; drowsiness; fever; nausea; vomiting; rash signs and symptoms of a blood clot such as chest pain; shortness of breath; pain, swelling, or warmth in the leg signs and symptoms of hemolytic anemia such as fast heartbeat; tiredness; dark yellow or brown urine; or yellowing of the eyes or skin signs and symptoms of kidney injury like trouble passing urine or change in the amount of urine sudden weight gain swelling of the ankles, feet, hands Side effects that usually do not require medical attention (report to your doctor or health care professional if they continue or are bothersome): diarrhea flushing headache increased sweating joint pain muscle cramps muscle pain nausea pain, redness, or irritation at site where injected tiredness This list may not describe all possible side effects. Call your doctor for medical advice about side effects. You may report side effects to FDA at 1-800-FDA-1088. This list may not describe all possible side effects. Call your doctor for medical advice about side  effects. You may report side effects to FDA at 1-800-FDA-1088. Where should I keep my medication? Keep out of the reach of children. This drug is usually given in a hospital or clinic and will not be stored at home. In rare cases, some brands of this medicine may be given at home. If you are using this medicine at home, you will be instructed on how to store this medicine. Throw away any unused medicine after the expiration date on the label. NOTE: This sheet is a summary. It may not cover all possible information. If you have questions about this medicine, talk to your doctor, pharmacist, or health care provider.  2023 Elsevier/Gold Standard (2018-10-21 00:00:00)   Influenza (Flu) Vaccine (Inactivated or Recombinant): What You Need to Know 1. Why get vaccinated? Influenza vaccine can prevent influenza (flu). Flu is a contagious disease that spreads around the [place] every year, usually between October and May. Anyone can get the flu, but it is more dangerous for some people. Infants and young children, people 31 years and older, pregnant people, and people with certain health conditions or a weakened immune system are at greatest risk of flu complications. Pneumonia, bronchitis, sinus infections, and ear infections are examples of flu-related complications. If you have a medical condition, such as heart disease, cancer, or diabetes, flu can make it worse. Flu can cause fever and chills, sore throat, muscle aches, fatigue, cough, headache, and runny or stuffy nose. Some people may have vomiting and diarrhea, though this is more common in children than adults. In an average year, thousands of people  in the [place] die from flu, and many more are hospitalized. Flu vaccine prevents millions of illnesses and flu-related visits to the doctor each year. 2. Influenza vaccines CDC recommends everyone 6 months and older get vaccinated every flu season. Children 6 months through 52 years of age  may need 2 doses during a single flu season. Everyone else needs only 1 dose each flu season. It takes about 2 weeks for protection to develop after vaccination. There are many flu viruses, and they are always changing. Each year a new flu vaccine is made to protect against the influenza viruses believed to be likely to cause disease in the upcoming flu season. Even when the vaccine doesn't exactly match these viruses, it may still provide some protection. Influenza vaccine does not cause flu. Influenza vaccine may be given at the same time as other vaccines. 3. Talk with your health care provider Tell your vaccination provider if the person getting the vaccine: Has had an allergic reaction after a previous dose of influenza vaccine, or has any severe, life-threatening allergies Has ever had Guillain-Barr Syndrome (also called "GBS") In some cases, your health care provider may decide to postpone influenza vaccination until a future visit. Influenza vaccine can be administered at any time during pregnancy. People who are or will be pregnant during influenza season should receive inactivated influenza vaccine. People with minor illnesses, such as a cold, may be vaccinated. People who are moderately or severely ill should usually wait until they recover before getting influenza vaccine. Your health care provider can give you more information. 4. Risks of a vaccine reaction Soreness, redness, and swelling where the shot is given, fever, muscle aches, and headache can happen after influenza vaccination. There may be a very small increased risk of Guillain-Barr Syndrome (GBS) after inactivated influenza vaccine (the flu shot). Young children who get the flu shot along with pneumococcal vaccine (PCV13) and/or DTaP vaccine at the same time might be slightly more likely to have a seizure caused by fever. Tell your health care provider if a child who is getting flu vaccine has ever had a seizure. People  sometimes faint after medical procedures, including vaccination. Tell your provider if you feel dizzy or have vision changes or ringing in the ears. As with any medicine, there is a very remote chance of a vaccine causing a severe allergic reaction, other serious injury, or death. 5. What if there is a serious problem? An allergic reaction could occur after the vaccinated person leaves the clinic. If you see signs of a severe allergic reaction (hives, swelling of the face and throat, difficulty breathing, a fast heartbeat, dizziness, or weakness), call 9-1-1 and get the person to the nearest hospital. For other signs that concern you, call your health care provider. Adverse reactions should be reported to the Vaccine Adverse Event Reporting System (VAERS). Your health care provider will usually file this report, or you can do it yourself. Visit the VAERS website at www.vaers.SamedayNews.es or call 306-525-1389. VAERS is only for reporting reactions, and VAERS staff members do not give medical advice. 6. The National Vaccine Injury Compensation Program The [employer] Vaccine Injury Compensation Program (VICP) is a federal program that was created to compensate people who may have been injured by certain vaccines. Claims regarding alleged injury or death due to vaccination have a time limit for filing, which may be as short as two years. Visit the VICP website at GoldCloset.com.ee or call 765-129-5530 to learn about the program and about filing  a claim. 7. How can I learn more? Ask your health care provider. Call your local or state health department. Visit the website of the Food and Drug Administration (FDA) for vaccine package inserts and additional information at TraderRating.uy. Contact the Centers for Disease Control and Prevention (CDC): Call (337)616-0423 (1-800-CDC-INFO) or Visit CDC's website at https://gibson.com/. Source: CDC Vaccine Information Statement  Inactivated Influenza Vaccine (11/05/2019) This same material is available at http://www.wolf.info/ for no charge. This information is not intended to replace advice given to you by your health care provider. Make sure you discuss any questions you have with your health care provider. Document Revised: 02/14/2021 Document Reviewed: 12/07/2020 Elsevier Patient Education  [name].

## 2022-01-22 NOTE — Assessment & Plan Note (Signed)
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 She wanted to space out her treatments, but we recommended she continue IVIG every 4 weeks through the winter months. She will proceed with IVIG today.  We will plan to see her back in 4 weeks with a CBC and comprehensive metabolic panel prior to her next IVIG.

## 2022-01-22 NOTE — Progress Notes (Signed)
[facility]  [address] 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  She did not require treatment until 2008 and has since been through multiple therapies.  She is currently taking venetoclax 400 mg daily with good control of her disease for over 4 years. Her blood counts remain fairly stable. We will plan to see her back in 4 weeks for repeat clinical assessment.  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  She wanted to space out her treatments, but we recommended she continue IVIG every 4 weeks through the winter months. She will proceed with IVIG today.  We will plan to see her back in 4 weeks with a CBC and comprehensive metabolic panel prior to her next IVIG.    The patient understands the plans discussed today and is in agreement with them.  She knows to contact our office if she develops concerns prior to her next appointment.     [name], PA-C  [facility] AT [facility] [address] [place] [address] Dept: [phone] Dept Fax: [phone]   Orders Placed This Encounter  Procedures   CBC and differential    This external order was created through the Results Console.   CBC    This external order was created through the Results Console.   Basic metabolic panel    This external order was created through the Results Console.   Comprehensive metabolic panel    This external order was created through the Results Console.   Hepatic function panel    This external order was created through the Results Console.      CHIEF COMPLAINT:  CC: Chronic lymphocytic leukemia with  combined immunodeficiency  Current Treatment: Venetoclax with monthly IVIG  HISTORY OF PRESENT ILLNESS:  Samantha Warren is a 76 y.o. 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  She was on observation until November 2008. She was initially treated with chlorambucil and prednisone, as she refused intravenous chemotherapy.  She had a partial response to this regimen.  By January 2010, she had progressive disease with a white count over 200,000, with associated anemia, splenomegaly, thrombocytopenia, and adenopathy.  She then received 5 cycles of fludarabine with good response.  She did well until January 2012, when she had progression once again.  She had a single dose of bendamustine and rituximab, which kept her disease under control for over a year.  However, she had a severe allergic reaction to the rituximab, so has not received further rituximab.  She was hospitalized after the bendamustine because of severe pancytopenia and required multiple transfusions, so was placed on observation.  In March 2013, she had progression of disease again, so was treated with bendamustine for 6 cycles, at a 50% dose reduction, and once again had a excellent response.  She was on observation until August 2014, then had progression of disease, so was placed on ibrutinib 420 mg daily.  The ibrutinib had kept her disease under fairly good control, but then she develops severe bilateral lower extremity cellulitis in April 2016, so ibrutinib was placed on hold.  She had persistent cellulitis, as well as  Clostridium difficile colitis in  May 2016 requiring hospitalization.  She was readmitted soon after discharge with worsening cellulitis, requiring a prolonged hospitalization.  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 She eventually had resolution of the cellulitis   As she was largely asymptomatic, we  continued observation until February 2017, at which time she had worsening splenomegaly.  Repeat CT imaging in February showed progressive disease, with marked splenomegaly and bulky lymphadenopathy.  She was then placed back on ibrutinib 420 mg daily.  The lymphocytosis, anemia and thrombocytopenia slowly improved.  Due to neutropenia, ibrutinib was placed on hold in December 2017.  The ibrutinib was resumed in January 2018, but discontinued in August 2018 due to toxicities, mainly severe abdominal muscle cramping.  She was hospitalized in early September 2018 with urinary tract infection and pancytopenia.  She received multiple red blood cell and platelet transfusions during her stay.  She was admitted again in late September 2018 with perineal cellulitis extending to the lower abdomen and upper thigh, as well as persistent pancytopenia.  CT abdomen and pelvis revealed interval decrease in the abdominal lymphadenopathy, chronic massive splenomegaly with scattered small splenic infarcts, and small left pleural effusion with bibasilar atelectasis.  She required packed red blood cells while hospitalized.  She was discharged home with home health and followed up with the [facility].  We continued to follow her closely, but did not place her on a treatment due to the persistent cellulitis.  She then was admitted in October 2018 with severe hypercalcemia, with a calcium of 15.  She was treated with IV fluids, zoledronic acid and Calcitonin injections.  We continued to monitor her closely.  She had recurrent hypercalcemia, for which she received IV fluids and zolendronic acid as an outpatient.     Due to worsening pancytopenia, she underwent bone marrow biopsy in January 2019.   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  The cellulitis had improved to a point we felt she could be treated again, so she was placed on venetoclax CLL in January  2019.  She has continued on venetoclax 400 mg daily after ramp up dosing and has tolerated that fairly well, except for mild diarrhea.  Since starting venetoclax, she has had decreasing splenomegaly and improvement in her pancytopenia.  She initially continued to require IV fluids and zoledronic acid for the hypercalcemia.  Her last dose of zoledronic acid was given in March 2019.  In March, she had an irregular heart rhythm and EKG revealed occasional premature atrial complexes, as well as an incomplete right bundle branch block.  This was stable compared to EKG done in October 2018.  Quantitative immunoglobulins done in August 2019 revealed a normal IgG, with low IgA and IgM.  She has continued IVIG monthly.  She had an episode of Campylobacter diarrhea in November 2019, which was treated with azithromycin for 7 days with resolution of her diarrhea. Venetoclax was held temporarily until her diarrhea resolved.  She has since tolerated venetoclax without significant difficulty.     In February 2022, she had a right diagnostic mammogram due to right breast subareolar tenderness, which did not reveal any evidence of malignancy.  CT abdomen in April did not reveal any acute abnormality or other findings to explain the left-sided abdominal pain.  There was resolution of previously seen gross splenomegaly.  There were prominent retroperitoneal lymph nodes, which had significantly diminished in size compared  to prior exam.  A hiatal hernia was seen. There was descending colonic diverticulosis without evidence of acute diverticulitis.  Bilateral screening mammogram in May 2022 revealed possible asymmetry in the left breast.  Left diagnostic mammogram and ultrasound in June revealed a cluster of cysts/apocrine cyst in the left breast at 2 o'clock, 3 cm the nipple, measuring 6 x 3 x 6 mm, unchanged from the exam dated May 2021. There were no solid masses or suspicious lesions. She is up to date on colonoscopy with her last  colonoscopy being in June 2022 with Dr. Lyda Jester.  This was negative, and repeat in 10 years was advised.  She has continued venetoclax 400 mg daily, as well as IVIG monthly.  Bilateral screening mammogram in May 2023 did not reveal any evidence of malignancy.  INTERVAL HISTORY:  Samantha Warren is here today for repeat clinical assessment.  She states she continues venetoclax daily without difficulty. She denies fevers or chills. She reports stable right knee pain. Her appetite is good. Her weight has decreased 1 pounds over last 4 weeks .  REVIEW OF SYSTEMS:  Review of Systems  Constitutional:  Negative for appetite change, chills, fatigue, fever and unexpected weight change.  HENT:   Negative for lump/mass, mouth sores and sore throat.   Respiratory:  Negative for cough and shortness of breath.   Cardiovascular:  Negative for chest pain and leg swelling.  Gastrointestinal:  Negative for abdominal pain, constipation, diarrhea, nausea and vomiting.  Endocrine: Negative for hot flashes.  Genitourinary:  Negative for difficulty urinating, dysuria, frequency and hematuria.   Musculoskeletal:  Negative for arthralgias, back pain and myalgias.  Skin:  Negative for rash.  Neurological:  Negative for dizziness and headaches.  Hematological:  Negative for adenopathy. Does not bruise/bleed easily.  Psychiatric/Behavioral:  Negative for depression and sleep disturbance. The patient is not nervous/anxious.      VITALS:  Blood pressure (!) 140/70, pulse 77, temperature 98.3 F (36.8 C), temperature source Oral, resp. rate 18, height 5' 1.5" (1.562 m), weight 220 lb 9.6 oz (100.1 kg), SpO2 100 %.  Wt Readings from Last 3 Encounters:  01/22/22 220 lb 9.6 oz (100.1 kg)  12/24/21 221 lb 8 oz (100.5 kg)  12/24/21 221 lb (100.2 kg)    Body mass index is 41.01 kg/m.  Performance status (ECOG): 1 - Symptomatic but completely ambulatory  PHYSICAL EXAM:  Physical Exam Vitals and nursing note reviewed.   Constitutional:      General: She is not in acute distress.    Appearance: Normal appearance.  HENT:     Head: Normocephalic and atraumatic.     Mouth/Throat:     Mouth: Mucous membranes are moist.     Pharynx: Oropharynx is clear. No oropharyngeal exudate or posterior oropharyngeal erythema.  Eyes:     General: No scleral icterus.    Extraocular Movements: Extraocular movements intact.     Conjunctiva/sclera: Conjunctivae normal.     Pupils: Pupils are equal, round, and reactive to light.  Cardiovascular:     Rate and Rhythm: Normal rate and regular rhythm.     Heart sounds: Normal heart sounds. No murmur heard.    No friction rub. No gallop.  Pulmonary:     Effort: Pulmonary effort is normal.     Breath sounds: Normal breath sounds. No wheezing, rhonchi or rales.  Abdominal:     General: There is no distension.     Palpations: Abdomen is soft. There is no hepatomegaly, splenomegaly or mass.  Tenderness: There is no abdominal tenderness.  Musculoskeletal:        General: Normal range of motion.     Cervical back: Normal range of motion and neck supple. No tenderness.     Right lower leg: No edema.     Left lower leg: No edema.  Lymphadenopathy:     Cervical: No cervical adenopathy.     Upper Body:     Right upper body: No supraclavicular or axillary adenopathy.     Left upper body: No supraclavicular or axillary adenopathy.     Lower Body: No right inguinal adenopathy. No left inguinal adenopathy.  Skin:    General: Skin is warm and dry.     Coloration: Skin is not jaundiced.     Findings: No rash.  Neurological:     Mental Status: She is alert and oriented to person, place, and time.     Cranial Nerves: No cranial nerve deficit.  Psychiatric:        Mood and Affect: Mood normal.        Behavior: Behavior normal.        Thought Content: Thought content normal.     LABS:      Latest Ref Rng & Units 01/22/2022   12:00 AM 12/24/2021   12:00 AM 11/26/2021    12:00 AM  CBC  WBC  4.0     4.3     3.9      Hemoglobin 12.0 - 16.0 12.6     12.1     11.9      Hematocrit 36 - 46 37     36     36      Platelets 150 - 400 K/uL 48     57     60         This result is from an external source.      Latest Ref Rng & Units 01/22/2022   12:00 AM 12/24/2021   12:00 AM 11/26/2021   12:00 AM  CMP  BUN 4 - 21 20     17     26       Creatinine 0.5 - 1.1 1.0     0.8     0.8      Sodium 137 - 147 140     139     141      Potassium 3.5 - 5.1 mEq/L 4.1     3.8     4.3      Chloride 99 - 108 108     111     110      CO2 13 - 22 23     24     26       Calcium 8.7 - 10.7 8.9     8.6     8.9      Alkaline Phos 25 - 125 64     79     77      AST 13 - 35 33     27     27      ALT 7 - 35 U/L 29     28     30          This result is from an external source.     No results found for: "CEA1", "CEA" / No results found for: "CEA1", "CEA" No results found for: "PSA1" No results found for: "HTD428" No results found for: "CAN125"  No results found for: "TOTALPROTELP", "  ALBUMINELP", "A1GS", "A2GS", "BETS", "BETA2SER", "GAMS", "MSPIKE", "SPEI" No results found for: "TIBC", "FERRITIN", "IRONPCTSAT" No results found for: "LDH"  STUDIES:  No results found.    HISTORY:   Past Medical History:  Diagnosis Date   Cancer [facility])     Past Surgical History:  Procedure Laterality Date   arm surgery     GALLBLADDER SURGERY     TOTAL KNEE ARTHROPLASTY      Family History  Problem Relation Age of Onset   Cancer Mother    Hypertension Father    Hypertension Sister    Stroke Sister    Hypertension Brother    Stroke Brother     Social History:  reports that she has been smoking cigarettes. She has a 15.00 pack-year smoking history. She has never used smokeless tobacco. She reports current drug use. No history on file for alcohol use.The patient is alone today.  Allergies:  Allergies  Allergen Reactions   Metronidazole     Other reaction(s): Other (See Comments)    Rituximab     Other reaction(s): Other (See Comments)   Sulfa Antibiotics Rash    Current Medications: Current Outpatient Medications  Medication Sig Dispense Refill   alendronate (FOSAMAX) 70 MG tablet      amLODipine (NORVASC) 2.5 MG tablet Take 2.5 mg by mouth daily.     Ascorbic Acid (VITAMIN C WITH ROSE HIPS) 500 MG tablet Take 500 mg by mouth daily.     benzonatate (TESSALON) 100 MG capsule TAKE ONE CAPSULE BY MOUTH 3 TIMES A DAY AS NEEDED 60 capsule 5   calcium carbonate (OSCAL) 1500 (600 Ca) MG TABS tablet Take by mouth.     Cholecalciferol (VITAMIN D3) 125 MCG (5000 UT) TABS Take 1 tablet by mouth daily.     clotrimazole-betamethasone (LOTRISONE) cream Apply BID x 7 days     diclofenac Sodium (VOLTAREN) 1 % GEL Apply topically.     diphenoxylate-atropine (LOMOTIL) 2.5-0.025 MG tablet Take 1-2 tablets by mouth every 6 (six) hours.     ibuprofen (ADVIL) 800 MG tablet      KLOR-CON M20 20 MEQ tablet Take 1 tablet (20 mEq total) by mouth 3 (three) times daily. 90 tablet 5   lidocaine (LIDODERM) 5 % 2 patches daily.     lidocaine-prilocaine (EMLA) cream Apply 1 Application topically as needed. 30 g 5   metroNIDAZOLE (FLAGYL) 500 MG tablet Take 500 mg by mouth 2 (two) times daily. This is on patients allergy list no prior reaction noted per patient , she has taken all but a few tablets and voiced no previous allegy that she is aware of.provider notified.     Multiple Vitamin (MULTIVITAMIN WITH MINERALS) TABS tablet Take 1 tablet by mouth daily.     Omega-3 Fatty Acids (FISH OIL) 1000 MG CAPS Take 1 capsule by mouth daily.     omeprazole (PRILOSEC) 20 MG capsule TAKE 1 CAPSULE BY MOUTH EVERY DAY 90 capsule 3   oxyCODONE (ROXICODONE) 15 MG immediate release tablet Take 15 mg by mouth every 6 (six) hours as needed.     prochlorperazine (COMPAZINE) 10 MG tablet Take 1 tablet (10 mg total) by mouth every 6 (six) hours as needed. 90 tablet 1   VENCLEXTA 100 MG tablet Take 4 tablets (400 mg  total) by mouth daily. 30 tablet 0   vitamin B-12 (CYANOCOBALAMIN) 100 MCG tablet Take 100 mcg by mouth daily.     Current Facility-Administered Medications  Medication Dose Route Frequency Provider Last Rate  Last Admin   sodium chloride flush (NS) 0.9 % injection 10 mL  10 mL Intracatheter PRN Derwood Kaplan, MD   10 mL at 01/22/22 0840   Facility-Administered Medications Ordered in Other Visits  Medication Dose Route Frequency Provider Last Rate Last Admin   influenza vac split quadrivalent PF (FLUARIX) injection 0.5 mL  0.5 mL Intramuscular Once Derwood Kaplan, MD       sodium chloride flush (NS) 0.9 % injection 10 mL  10 mL Intracatheter PRN Kamiah Fite, Vida Roller A, PA-C   10 mL at 09/26/20 [phone]

## 2022-01-22 NOTE — Assessment & Plan Note (Addendum)
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  She did not require treatment until 2008 and has since been through multiple therapies.  She is currently taking venetoclax 400 mg daily with good control of her disease for over 4 years. Her blood counts remain fairly stable. We will plan to see her back in 4 weeks for repeat clinical assessment. 

## 2022-02-18 ENCOUNTER — Encounter: Payer: Self-pay | Admitting: Oncology

## 2022-02-18 MED FILL — Immune Globulin (Human) IV Soln 10 GM/100ML: INTRAVENOUS | Qty: 450 | Status: AC

## 2022-02-19 ENCOUNTER — Inpatient Hospital Stay: Payer: Medicare Other

## 2022-02-19 ENCOUNTER — Inpatient Hospital Stay: Payer: Medicare Other | Attending: Hematology and Oncology | Admitting: Oncology

## 2022-02-19 ENCOUNTER — Encounter: Payer: Self-pay | Admitting: Oncology

## 2022-02-19 VITALS — BP 134/64 | HR 83 | Temp 98.0°F | Resp 17 | Ht 61.5 in | Wt 222.8 lb

## 2022-02-19 VITALS — BP 126/55 | HR 71 | Temp 97.7°F | Resp 18

## 2022-02-19 DIAGNOSIS — D819 Combined immunodeficiency, unspecified: Secondary | ICD-10-CM | POA: Insufficient documentation

## 2022-02-19 DIAGNOSIS — D61818 Other pancytopenia: Secondary | ICD-10-CM | POA: Diagnosis not present

## 2022-02-19 DIAGNOSIS — C9110 Chronic lymphocytic leukemia of B-cell type not having achieved remission: Secondary | ICD-10-CM

## 2022-02-19 DIAGNOSIS — D801 Nonfamilial hypogammaglobulinemia: Secondary | ICD-10-CM

## 2022-02-19 LAB — BASIC METABOLIC PANEL
BUN: 25 — AB (ref 4–21)
CO2: 23 — AB (ref 13–22)
Chloride: 107 (ref 99–108)
Creatinine: 0.9 (ref 0.5–1.1)
Glucose: 90
Potassium: 3.9 mEq/L (ref 3.5–5.1)
Sodium: 138 (ref 137–147)

## 2022-02-19 LAB — CBC: RBC: 4.15 (ref 3.87–5.11)

## 2022-02-19 LAB — CBC AND DIFFERENTIAL
HCT: 38 (ref 36–46)
Hemoglobin: 12.6 (ref 12.0–16.0)
Neutrophils Absolute: 1.46
Platelets: 59 10*3/uL — AB (ref 150–400)
WBC: 5.4

## 2022-02-19 LAB — HEPATIC FUNCTION PANEL
ALT: 24 U/L (ref 7–35)
AST: 27 (ref 13–35)
Alkaline Phosphatase: 85 (ref 25–125)
Bilirubin, Total: 0.4

## 2022-02-19 LAB — COMPREHENSIVE METABOLIC PANEL
Albumin: 4.3 (ref 3.5–5.0)
Calcium: 9 (ref 8.7–10.7)

## 2022-02-19 MED ORDER — DIPHENHYDRAMINE HCL 50 MG/ML IJ SOLN
25.0000 mg | Freq: Once | INTRAMUSCULAR | Status: AC
  Administered 2022-02-19: 25 mg via INTRAVENOUS
  Filled 2022-02-19: qty 1

## 2022-02-19 MED ORDER — IMMUNE GLOBULIN (HUMAN) 10 GM/100ML IV SOLN
0.9000 g/kg | Freq: Once | INTRAVENOUS | Status: AC
  Administered 2022-02-19: 45 g via INTRAVENOUS
  Filled 2022-02-19: qty 400

## 2022-02-19 MED ORDER — ACETAMINOPHEN 325 MG PO TABS
650.0000 mg | ORAL_TABLET | Freq: Once | ORAL | Status: AC
  Administered 2022-02-19: 650 mg via ORAL
  Filled 2022-02-19: qty 2

## 2022-02-19 MED ORDER — ALTEPLASE 2 MG IJ SOLR
2.0000 mg | Freq: Once | INTRAMUSCULAR | Status: DC | PRN
  Filled 2022-02-19: qty 2

## 2022-02-19 MED ORDER — HEPARIN SOD (PORK) LOCK FLUSH 100 UNIT/ML IV SOLN
500.0000 [IU] | Freq: Once | INTRAVENOUS | Status: AC | PRN
  Administered 2022-02-19: 500 [IU]

## 2022-02-19 MED ORDER — SODIUM CHLORIDE 0.9% FLUSH
10.0000 mL | Freq: Once | INTRAVENOUS | Status: AC | PRN
  Administered 2022-02-19: 10 mL

## 2022-02-19 MED ORDER — DEXTROSE 5 % IV SOLN: Freq: Once | INTRAVENOUS | Status: AC

## 2022-02-19 NOTE — Patient Instructions (Signed)

## 2022-02-19 NOTE — Progress Notes (Signed)
[facility]  [address] 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  She did not require treatment until 2008 and has since been through multiple therapies.  She is currently taking venetoclax 400 mg daily with good control of her disease for nearly 5 years. Her blood counts remain stable.    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  She wanted to space out her treatments, but we recommended she continue IVIG every 4 weeks through the winter months. She will proceed with IVIG today.     Plan:  We needed to use alteplase when drawing from her port last time and will have to do it again today. We may need to evaluate for presence of a sheath. We will plan to see her back in 4 weeks with a CBC and comprehensive metabolic panel prior to her next IVIG. After the winter months, we could consider spacing out her appointments to every 2-3 months. The patient understands the plans discussed today and is in agreement with them.  She knows to contact our office if she develops concerns prior to her next appointment.   I provided 20 minutes of face-to-face time during this encounter and > 50% was spent counseling as documented under my assessment and plan.    Derwood Kaplan, MD  [facility] [address] [place] [address] Dept: [phone] Dept Fax: [phone]   Orders Placed This Encounter  Procedures   CBC and differential    This external order was created through the Results Console.   CBC    This external order was created through the Results Console.      CHIEF COMPLAINT:   CC: CLL with combined immunodeficiency  Current Treatment: Venetoclax/IVIG  HISTORY OF PRESENT ILLNESS:  Samantha Warren is a 76 y.o. 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  She was on observation until November 2008. She was initially treated with chlorambucil and prednisone, as she refused intravenous chemotherapy.  She had a partial response to this regimen.  By January 2010, she had progressive disease with a white count over 200,000, with associated anemia, splenomegaly, thrombocytopenia, and adenopathy.  She then received 5 cycles of fludarabine with good response.  She did well until January 2012, when she had progression once again.  She had a single dose of bendamustine and rituximab, which kept her disease under control for over a year.  However, she had a severe allergic reaction to the rituximab, so has not received further rituximab.  She was hospitalized after the bendamustine because of severe pancytopenia and required multiple transfusions, so was placed on observation.  In March 2013, she had progression of disease again, so was treated with bendamustine for 6 cycles, at a 50% dose reduction, and once again had a excellent response.  She was on observation until August 2014, then had progression of disease, so was placed on ibrutinib 420 mg daily.  The ibrutinib had kept her disease under fairly good control, but then she develops severe bilateral lower extremity cellulitis in April 2016, so ibrutinib was placed on hold.  She had persistent cellulitis, as well as  Clostridium  difficile colitis in May 2016 requiring hospitalization.  She was readmitted soon after discharge with worsening cellulitis, requiring a prolonged hospitalization.  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 She eventually had resolution of the cellulitis   As she was largely asymptomatic,  we continued observation until February 2017, at which time she had worsening splenomegaly.  Repeat CT imaging in February showed progressive disease, with marked splenomegaly and bulky lymphadenopathy.  She was then placed back on ibrutinib 42m daily.  The lymphocytosis, anemia and thrombocytopenia slowly improved.  Due to neutropenia, ibrutinib was placed on hold in December 2017.  The ibrutinib was resumed in January 2018, but discontinued in August 2018 due to toxicities, mainly severe abdominal muscle cramping.  She was hospitalized in early September 2018 with urinary tract infection and pancytopenia.  She received multiple red blood cell and platelet transfusions during her stay.  She was admitted again in late September 2018 with perineal cellulitis extending to the lower abdomen and upper thigh, as well as persistent pancytopenia.  CT abdomen and pelvis revealed interval decrease in the abdominal lymphadenopathy, chronic massive splenomegaly with scattered small splenic infarcts, and small left pleural effusion with bibasilar atelectasis.  She required packed red blood cells while hospitalized.  She was discharged home with home health [place]  We continued to follow her closely, but did not place her on a treatment due to the persistent cellulitis.  She then was admitted in October 2018 with severe hypercalcemia, with a calcium of 15.  She was treated with IV fluids, zoledronic acid and Calcitonin injections.  We continued to monitor her closely.  She had recurrent hypercalcemia, for which she received IV fluids and zolendronic acid as an outpatient.     Due to worsening pancytopenia, she underwent bone marrow biopsy in January 2019.   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  The cellulitis had improved to a point we felt she could be treated again, so she was placed on venetoclax CLL in January  2019.  She has continued on venetoclax 400 mg daily after ramp up dosing and has tolerated that fairly well, except for mild diarrhea.  Since starting venetoclax, she has had decreasing splenomegaly and improvement in her pancytopenia.  She initially continued to require IV fluids and zoledronic acid for the hypercalcemia.  Her last dose of zoledronic acid was given in March 2019.  In March, she had an irregular heart rhythm and EKG revealed occasional premature atrial complexes, as well as an incomplete right bundle branch block.  This was stable compared to EKG done in October 2018.  Quantitative immunoglobulins done in August 2019 revealed a normal IgG, with low IgA and IgM.  She has continued IVIG monthly.  She had an episode of Campylobacter diarrhea in November 2019, which was treated with azithromycin for 7 days with resolution of her diarrhea. Venetoclax was held temporarily until her diarrhea resolved.  She has since tolerated venetoclax without significant difficulty.     In February 2022, she had a right diagnostic mammogram due to right breast subareolar tenderness, which did not reveal any evidence of malignancy.  CT abdomen in April did not reveal any acute abnormality or other findings to explain the left-sided abdominal pain.  There was resolution of previously seen gross splenomegaly.  There were prominent retroperitoneal lymph nodes, which had significantly diminished in  size compared to prior exam.  A hiatal hernia was seen. There was descending colonic diverticulosis without evidence of acute diverticulitis.  Bilateral screening mammogram in May 2022 revealed possible asymmetry in the left breast.  Left diagnostic mammogram and ultrasound in June revealed a cluster of cysts/apocrine cyst in the left breast at 2 o'clock, 3 cm the nipple, measuring 6 x 3 x 6 mm, unchanged from the exam dated May 2021. There were no solid masses or suspicious lesions. She is up to date on colonoscopy with her last  colonoscopy being in June 2022 with Dr. Lyda Jester.  This was negative, and repeat in 10 years was advised.    INTERVAL HISTORY:  Samantha Warren is here today for repeat clinical assessment prior to IVIG. She states she is feeling okay. We needed to use alteplase when drawing from her port last time and will have to do it again today. We may need to evaluate for presence of a sheath. She notes she has been having some right knee pain as usual. Her CBC is stable, with platelets improved to 59,000. She denies fevers, chills or night sweats. She denies any other pain. Her appetite is good. Her weight has increased 2 pounds since her last visit.  REVIEW OF SYSTEMS:  Review of Systems  Constitutional:  Negative for appetite change, chills, fatigue, fever and unexpected weight change.  HENT:   Negative for lump/mass, mouth sores and sore throat.   Respiratory:  Negative for cough and shortness of breath.   Cardiovascular:  Negative for chest pain and leg swelling.  Gastrointestinal:  Negative for abdominal pain, constipation, diarrhea, nausea and vomiting.  Endocrine: Negative for hot flashes.  Genitourinary:  Negative for difficulty urinating, dysuria, frequency and hematuria.   Musculoskeletal:  Negative for arthralgias, back pain and myalgias.  Skin:  Negative for rash.  Neurological:  Negative for dizziness and headaches.  Hematological:  Negative for adenopathy. Does not bruise/bleed easily.  Psychiatric/Behavioral:  Negative for depression and sleep disturbance. The patient is not nervous/anxious.      VITALS:  Blood pressure 134/64, pulse 83, temperature 98 F (36.7 C), temperature source Oral, resp. rate 17, height 5' 1.5" (1.562 m), weight 222 lb 12.8 oz (101.1 kg), SpO2 98 %.  Wt Readings from Last 3 Encounters:  02/19/22 222 lb 12.8 oz (101.1 kg)  01/22/22 220 lb 9.6 oz (100.1 kg)  12/24/21 221 lb 8 oz (100.5 kg)    Body mass index is 41.42 kg/m.  Performance status (ECOG): 1 -  Symptomatic but completely ambulatory  PHYSICAL EXAM:  Physical Exam Vitals and nursing note reviewed.  Constitutional:      General: She is not in acute distress.    Appearance: Normal appearance. She is not ill-appearing.  HENT:     Head: Normocephalic and atraumatic.     Mouth/Throat:     Mouth: Mucous membranes are moist.     Pharynx: Oropharynx is clear. No oropharyngeal exudate or posterior oropharyngeal erythema.  Eyes:     General: No scleral icterus.    Extraocular Movements: Extraocular movements intact.     Conjunctiva/sclera: Conjunctivae normal.     Pupils: Pupils are equal, round, and reactive to light.  Cardiovascular:     Rate and Rhythm: Normal rate and regular rhythm.     Heart sounds: Normal heart sounds. No murmur heard.    No friction rub. No gallop.  Pulmonary:     Effort: Pulmonary effort is normal.     Breath sounds: Normal breath  sounds. No wheezing, rhonchi or rales.  Abdominal:     General: There is no distension.     Palpations: Abdomen is soft. There is no hepatomegaly, splenomegaly or mass.     Tenderness: There is no abdominal tenderness.  Musculoskeletal:        General: Normal range of motion.     Cervical back: Normal range of motion and neck supple. No tenderness.     Right lower leg: No edema.     Left lower leg: No edema.  Lymphadenopathy:     Cervical: No cervical adenopathy.     Upper Body:     Right upper body: No supraclavicular or axillary adenopathy.     Left upper body: No supraclavicular or axillary adenopathy.     Lower Body: No right inguinal adenopathy. No left inguinal adenopathy.  Skin:    General: Skin is warm and dry.     Coloration: Skin is not jaundiced.     Findings: No rash.  Neurological:     Mental Status: She is alert and oriented to person, place, and time.     Cranial Nerves: No cranial nerve deficit.  Psychiatric:        Mood and Affect: Mood normal.        Behavior: Behavior normal.        Thought  Content: Thought content normal.        Judgment: Judgment normal.    LABS:      Latest Ref Rng & Units 02/19/2022   12:00 AM 01/22/2022   12:00 AM 12/24/2021   12:00 AM  CBC  WBC  5.4     4.0     4.3      Hemoglobin 12.0 - 16.0 12.6     12.6     12.1      Hematocrit 36 - 46 38     37     36      Platelets 150 - 400 K/uL 59     48     57         This result is from an external source.      Latest Ref Rng & Units 02/19/2022   12:00 AM 01/22/2022   12:00 AM 12/24/2021   12:00 AM  CMP  BUN 4 - _0 Creatinine 0.5 - 1.1 0.9     1.0     0.8      Sodium 137 - 147 138     140     139      Potassium 3.5 - 5.1 mEq/L 3.9     4.1     3.8      Chloride 99 - 108 107     108     111      CO2 13 - _1 Calcium 8.7 - 10.7 9.0     8.9     8.6      Alkaline Phos 25 - 125 85     64     79      AST 13 - 35 27     33     27      ALT 7 - 35 U/L 24     29     28  This result is from an external source.     No results found for: "CEA1", "CEA" / No results found for: "CEA1", "CEA" No results found for: "PSA1" No results found for: "XTK240" No results found for: "CAN125"  No results found for: "TOTALPROTELP", "ALBUMINELP", "A1GS", "A2GS", "BETS", "BETA2SER", "GAMS", "MSPIKE", "SPEI" No results found for: "TIBC", "FERRITIN", "IRONPCTSAT" No results found for: "LDH"  STUDIES:  No results found.    HISTORY:   Past Medical History:  Diagnosis Date   Cancer [facility])     Past Surgical History:  Procedure Laterality Date   arm surgery     GALLBLADDER SURGERY     TOTAL KNEE ARTHROPLASTY      Family History  Problem Relation Age of Onset   Cancer Mother    Hypertension Father    Hypertension Sister    Stroke Sister    Hypertension Brother    Stroke Brother     Social History:  reports that she has been smoking cigarettes. She has a 15.00 pack-year smoking history. She has never used smokeless tobacco. She reports current drug use. No  history on file for alcohol use.The patient is alone today.  Allergies:  Allergies  Allergen Reactions   Metronidazole     Other reaction(s): Other (See Comments)   Rituximab     Other reaction(s): Other (See Comments)   Sulfa Antibiotics Rash    Current Medications: Current Outpatient Medications  Medication Sig Dispense Refill   alendronate (FOSAMAX) 70 MG tablet      amLODipine (NORVASC) 2.5 MG tablet Take 2.5 mg by mouth daily.     Ascorbic Acid (VITAMIN C WITH ROSE HIPS) 500 MG tablet Take 500 mg by mouth daily.     benzonatate (TESSALON) 100 MG capsule TAKE ONE CAPSULE BY MOUTH 3 TIMES A DAY AS NEEDED 60 capsule 5   calcium carbonate (OSCAL) 1500 (600 Ca) MG TABS tablet Take by mouth.     Cholecalciferol (VITAMIN D3) 125 MCG (5000 UT) TABS Take 1 tablet by mouth daily.     clotrimazole-betamethasone (LOTRISONE) cream Apply BID x 7 days     diclofenac Sodium (VOLTAREN) 1 % GEL Apply topically.     diphenoxylate-atropine (LOMOTIL) 2.5-0.025 MG tablet Take 1-2 tablets by mouth every 6 (six) hours.     ibuprofen (ADVIL) 800 MG tablet      KLOR-CON M20 20 MEQ tablet Take 1 tablet (20 mEq total) by mouth 3 (three) times daily. 90 tablet 5   lidocaine (LIDODERM) 5 % 2 patches daily.     lidocaine-prilocaine (EMLA) cream Apply 1 Application topically as needed. 30 g 5   metroNIDAZOLE (FLAGYL) 500 MG tablet Take 500 mg by mouth 2 (two) times daily. This is on patients allergy list no prior reaction noted per patient , she has taken all but a few tablets and voiced no previous allegy that she is aware of.provider notified.     Multiple Vitamin (MULTIVITAMIN WITH MINERALS) TABS tablet Take 1 tablet by mouth daily.     Omega-3 Fatty Acids (FISH OIL) 1000 MG CAPS Take 1 capsule by mouth daily.     omeprazole (PRILOSEC) 20 MG capsule TAKE 1 CAPSULE BY MOUTH EVERY DAY 90 capsule 3   oxyCODONE (ROXICODONE) 15 MG immediate release tablet Take 15 mg by mouth every 6 (six) hours as needed.      prochlorperazine (COMPAZINE) 10 MG tablet Take 1 tablet (10 mg total) by mouth every 6 (six) hours as needed. 90 tablet 1  VENCLEXTA 100 MG tablet Take 4 tablets (400 mg total) by mouth daily. 30 tablet 0   vitamin B-12 (CYANOCOBALAMIN) 100 MCG tablet Take 100 mcg by mouth daily.     No current facility-administered medications for this visit.   Facility-Administered Medications Ordered in Other Visits  Medication Dose Route Frequency Provider Last Rate Last Admin   influenza vac split quadrivalent PF (FLUARIX) injection 0.5 mL  0.5 mL Intramuscular Once Derwood Kaplan, MD       sodium chloride flush (NS) 0.9 % injection 10 mL  10 mL Intracatheter PRN Mosher, Vida Roller A, PA-C   10 mL at 09/26/20 2171         I,Gabriella Ballesteros,acting as a scribe for Derwood Kaplan, MD.,have documented all relevant documentation on the behalf of Derwood Kaplan, MD,as directed by  Derwood Kaplan, MD while in the presence of Derwood Kaplan, MD.

## 2022-03-08 ENCOUNTER — Encounter: Payer: Self-pay | Admitting: Oncology

## 2022-03-15 NOTE — Assessment & Plan Note (Signed)
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  She did not require treatment until 2008 and has since been through multiple therapies.  She is currently taking venetoclax 400 mg daily with good control of her disease for over 4 years. Her blood counts remain fairly stable. We will plan to see her back in 4 weeks for repeat clinical assessment.

## 2022-03-15 NOTE — Progress Notes (Signed)
[facility]  [address] 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  She did not require treatment until 2008 and has since been through multiple therapies.  She is currently taking venetoclax 400 mg daily with good control of her disease for over 4 years. Her blood counts remain fairly stable. We will plan to see her back in 4 weeks for repeat clinical assessment.  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  She wanted to space out her treatments, but we recommended she continue IVIG every 4 weeks through the winter months. She will proceed with IVIG today.  We will plan to see her back in 4 weeks with a CBC and comprehensive metabolic panel prior to her next IVIG.    The patient understands the plans discussed today and is in agreement with them.  She knows to contact our office if she develops concerns prior to her next appointment.   I provided 15 minutes of face-to-face time during this encounter and > 50% was spent counseling as documented under my assessment and plan.    [name], PA-C  [facility] AT [facility] [address] [place] [address] Dept: [phone] Dept Fax: [phone]   Orders Placed This Encounter  Procedures   CBC and differential    This external order was created through the Results Console.   CBC    This external order was created through the Results Console.   Basic metabolic panel    This external order was created through the Results Console.   Comprehensive metabolic panel    This external order was created through the Results Console.    Hepatic function panel    This external order was created through the Results Console.      CHIEF COMPLAINT:  CC: Chronic lymphocytic leukemia with combined immunodeficiency  Current Treatment: Venetoclax 400 mg daily with IVIG every 4 weeks  HISTORY OF PRESENT ILLNESS:  Samantha Warren is a 76 y.o. 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  She was on observation until November 2008. She was initially treated with chlorambucil and prednisone, as she refused intravenous chemotherapy.  She had a partial response to this regimen.  By January 2010, she had progressive disease with a white count over 200,000, with associated anemia, splenomegaly, thrombocytopenia, and adenopathy.  She then received 5 cycles of fludarabine with good response.  She did well until January 2012, when she had progression once again.  She had a single dose of bendamustine and rituximab, which kept her disease under control for over a year.  However, she had a severe allergic reaction to the rituximab, so has not received further rituximab.  She was hospitalized after the bendamustine because of severe pancytopenia and required multiple transfusions, so was placed on observation.  In March 2013, she had progression of disease again, so was treated with bendamustine for 6 cycles, at a 50% dose reduction, and once again had a excellent response.  She was on observation until August 2014, then had progression of disease, so was placed on ibrutinib 420 mg daily.  The ibrutinib had kept her disease under fairly good  control, but then she develops severe bilateral lower extremity cellulitis in April 2016, so ibrutinib was placed on hold.  She had persistent cellulitis, as well as  Clostridium difficile colitis in May 2016 requiring hospitalization.  She was readmitted soon after discharge with worsening cellulitis, requiring a prolonged hospitalization.  She was also found to have severe combined  immunodeficiency secondary to her CLL, so began receiving IVIG monthly in June 2016. She eventually had resolution of the cellulitis   As she was largely asymptomatic, we continued observation until February 2017, at which time she had worsening splenomegaly.  Repeat CT imaging in February showed progressive disease, with marked splenomegaly and bulky lymphadenopathy.  She was then placed back on ibrutinib 435m daily.  The lymphocytosis, anemia and thrombocytopenia slowly improved.  Due to neutropenia, ibrutinib was placed on hold in December 2017.  The ibrutinib was resumed in January 2018, but discontinued in August 2018 due to toxicities, mainly severe abdominal muscle cramping.  She was hospitalized in early September 2018 with urinary tract infection and pancytopenia.  She received multiple red blood cell and platelet transfusions during her stay.  She was admitted again in late September 2018 with perineal cellulitis extending to the lower abdomen and upper thigh, as well as persistent pancytopenia.  CT abdomen and pelvis revealed interval decrease in the abdominal lymphadenopathy, chronic massive splenomegaly with scattered small splenic infarcts, and small left pleural effusion with bibasilar atelectasis.  She required packed red blood cells while hospitalized.  She was discharged home with home health and followed up with the [facility]  We continued to follow her closely, but did not place her on a treatment due to the persistent cellulitis.  She then was admitted in October 2018 with severe hypercalcemia, with a calcium of 15.  She was treated with IV fluids, zoledronic acid and Calcitonin injections.  We continued to monitor her closely.  She had recurrent hypercalcemia, for which she received IV fluids and zolendronic acid as an outpatient.     Due to worsening pancytopenia, she underwent bone marrow biopsy in January 2019.   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  The cellulitis had improved to a point we felt she could be treated again, so she was placed on venetoclax CLL in January 2019.  She has continued on venetoclax 400 mg daily after ramp up dosing and has tolerated that fairly well, except for mild diarrhea.  Since starting venetoclax, she has had decreasing splenomegaly and improvement in her pancytopenia.  She initially continued to require IV fluids and zoledronic acid for the hypercalcemia.  Her last dose of zoledronic acid was given in March 2019.  In March, she had an irregular heart rhythm and EKG revealed occasional premature atrial complexes, as well as an incomplete right bundle branch block.  This was stable compared to EKG done in October 2018.  Quantitative immunoglobulins done in August 2019 revealed a normal IgG, with low IgA and IgM.  She has continued IVIG monthly.  She had an episode of Campylobacter diarrhea in November 2019, which was treated with azithromycin for 7 days with resolution of her diarrhea. Venetoclax was held temporarily until her diarrhea resolved.  She has since tolerated venetoclax without significant difficulty.     In February 2022, she had a right diagnostic mammogram due to right breast subareolar tenderness, which did not reveal any evidence of malignancy.  CT abdomen in April did not reveal any acute abnormality or  other findings to explain the left-sided abdominal pain.  There was resolution of previously seen gross splenomegaly.  There were prominent retroperitoneal lymph nodes, which had significantly diminished in size compared to prior exam.  A hiatal hernia was seen. There was descending colonic diverticulosis without evidence of acute diverticulitis.  Bilateral screening mammogram in May 2022 revealed possible asymmetry in the left breast.  Left diagnostic mammogram and ultrasound in June revealed a cluster of cysts/apocrine cyst in the left breast at 2 o'clock, 3  cm the nipple, measuring 6 x 3 x 6 mm, unchanged from the exam dated May 2021. There were no solid masses or suspicious lesions. She is up to date on colonoscopy with her last colonoscopy being in June 2022 with Dr. Lyda Jester.  This was negative, and repeat in 10 years was advised.   Bilateral screening mammogram in May did not reveal any evidence of malignancy.  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  Pathology did not reveal any high-grade dysplasia or malignancy.  INTERVAL HISTORY:  Samantha Warren is here today for repeat clinical assessment prior to IVIG.  She states she continues venetoclax without difficulty.  She has occasional diarrhea, which is not severe.  She denies recent infection.  She denies fevers, chills or night sweats. She reports stable chronic knee pain. Her appetite is good. Her weight has decreased 1 pounds over last 4 weeks .  REVIEW OF SYSTEMS:  Review of Systems  Constitutional:  Negative for appetite change, chills, fatigue, fever and unexpected weight change.  HENT:   Negative for lump/mass, mouth sores and sore throat.   Respiratory:  Negative for cough and shortness of breath.   Cardiovascular:  Negative for chest pain and leg swelling.  Gastrointestinal:  Negative for abdominal pain, constipation, diarrhea, nausea and vomiting.  Endocrine: Negative for hot flashes.  Genitourinary:  Negative for difficulty urinating, dysuria, frequency and hematuria.   Musculoskeletal:  Positive for arthralgias. Negative for back pain and myalgias.  Skin:  Negative for rash.  Neurological:  Negative for dizziness and headaches.  Hematological:  Negative for adenopathy. Does not bruise/bleed easily.  Psychiatric/Behavioral:  Negative for depression and sleep disturbance. The patient is not nervous/anxious.      VITALS:  Blood pressure (!) 169/76, pulse 75, temperature 98.2 F (36.8 C), temperature source Oral, resp. rate 20, height 5' 1.5" (1.562 m), weight 221 lb 6.4 oz (100.4 kg),  SpO2 100 %.  Wt Readings from Last 3 Encounters:  03/19/22 221 lb (100.2 kg)  03/19/22 221 lb 6.4 oz (100.4 kg)  02/19/22 222 lb 12.8 oz (101.1 kg)    Body mass index is 41.16 kg/m.  Performance status (ECOG): 1 - Symptomatic but completely ambulatory  PHYSICAL EXAM:  Physical Exam Vitals and nursing note reviewed.  Constitutional:      General: She is not in acute distress.    Appearance: Normal appearance.  HENT:     Head: Normocephalic and atraumatic.     Mouth/Throat:     Mouth: Mucous membranes are moist.     Pharynx: Oropharynx is clear. No oropharyngeal exudate or posterior oropharyngeal erythema.  Eyes:     General: No scleral icterus.    Extraocular Movements: Extraocular movements intact.     Conjunctiva/sclera: Conjunctivae normal.     Pupils: Pupils are equal, round, and reactive to light.  Cardiovascular:     Rate and Rhythm: Normal rate and regular rhythm.     Heart sounds: Normal heart sounds. No murmur heard.  No friction rub. No gallop.  Pulmonary:     Effort: Pulmonary effort is normal.     Breath sounds: Normal breath sounds. No wheezing, rhonchi or rales.  Abdominal:     General: There is no distension.     Palpations: Abdomen is soft. There is no hepatomegaly, splenomegaly or mass.     Tenderness: There is no abdominal tenderness.  Musculoskeletal:        General: Normal range of motion.     Cervical back: Normal range of motion and neck supple. No tenderness.     Right lower leg: No edema.     Left lower leg: No edema.  Lymphadenopathy:     Cervical: No cervical adenopathy.     Upper Body:     Right upper body: No supraclavicular or axillary adenopathy.     Left upper body: No supraclavicular or axillary adenopathy.     Lower Body: No right inguinal adenopathy. No left inguinal adenopathy.  Skin:    General: Skin is warm and dry.     Coloration: Skin is not jaundiced.     Findings: No rash.  Neurological:     Mental Status: She is alert  and oriented to person, place, and time.     Cranial Nerves: No cranial nerve deficit.  Psychiatric:        Mood and Affect: Mood normal.        Behavior: Behavior normal.        Thought Content: Thought content normal.    LABS:      Latest Ref Rng & Units 03/19/2022   12:00 AM 02/19/2022   12:00 AM 01/22/2022   12:00 AM  CBC  WBC  5.9     5.4     4.0      Hemoglobin 12.0 - 16.0 12.6     12.6     12.6      Hematocrit 36 - 46 37     38     37      Platelets 150 - 400 K/uL 50     59     48         This result is from an external source.      Latest Ref Rng & Units 03/19/2022   12:00 AM 02/19/2022   12:00 AM 01/22/2022   12:00 AM  CMP  BUN 4 - _0 Creatinine 0.5 - 1.1 0.8     0.9     1.0      Sodium 137 - 147 140     138     140      Potassium 3.5 - 5.1 mEq/L 4.1     3.9     4.1      Chloride 99 - 108 108     107     108      CO2 13 - _1 Calcium 8.7 - 10.7 9.0     9.0     8.9      Alkaline Phos 25 - 125 77     85     64      AST 13 - 35 27     27     33      ALT 7 - 35 U/L 26  24     29         This result is from an external source.     No results found for: "CEA1", "CEA" / No results found for: "CEA1", "CEA" No results found for: "PSA1" No results found for: "EXB284" No results found for: "CAN125"  No results found for: "TOTALPROTELP", "ALBUMINELP", "A1GS", "A2GS", "BETS", "BETA2SER", "GAMS", "MSPIKE", "SPEI" No results found for: "TIBC", "FERRITIN", "IRONPCTSAT" No results found for: "LDH"  STUDIES:  No results found.    HISTORY:   Past Medical History:  Diagnosis Date   Cancer [facility])     Past Surgical History:  Procedure Laterality Date   arm surgery     GALLBLADDER SURGERY     TOTAL KNEE ARTHROPLASTY      Family History  Problem Relation Age of Onset   Cancer Mother    Hypertension Father    Hypertension Sister    Stroke Sister    Hypertension Brother    Stroke Brother     Social History:   reports that she has been smoking cigarettes. She has a 15.00 pack-year smoking history. She has never used smokeless tobacco. She reports current drug use. No history on file for alcohol use.The patient is alone today.  Allergies:  Allergies  Allergen Reactions   Metronidazole     Other reaction(s): Other (See Comments)   Rituximab     Other reaction(s): Other (See Comments)   Sulfa Antibiotics Rash    Current Medications: Current Outpatient Medications  Medication Sig Dispense Refill   alendronate (FOSAMAX) 70 MG tablet      amLODipine (NORVASC) 2.5 MG tablet Take 2.5 mg by mouth daily.     Ascorbic Acid (VITAMIN C WITH ROSE HIPS) 500 MG tablet Take 500 mg by mouth daily.     benzonatate (TESSALON) 100 MG capsule TAKE ONE CAPSULE BY MOUTH 3 TIMES A DAY AS NEEDED 60 capsule 5   calcium carbonate (OSCAL) 1500 (600 Ca) MG TABS tablet Take by mouth.     Cholecalciferol (VITAMIN D3) 125 MCG (5000 UT) TABS Take 1 tablet by mouth daily.     diclofenac Sodium (VOLTAREN) 1 % GEL Apply topically.     diphenoxylate-atropine (LOMOTIL) 2.5-0.025 MG tablet Take 1-2 tablets by mouth every 6 (six) hours.     ibuprofen (ADVIL) 800 MG tablet      KLOR-CON M20 20 MEQ tablet Take 1 tablet (20 mEq total) by mouth 3 (three) times daily. 90 tablet 5   lidocaine (LIDODERM) 5 % 2 patches daily.     lidocaine-prilocaine (EMLA) cream Apply 1 Application topically as needed. 30 g 5   metroNIDAZOLE (FLAGYL) 500 MG tablet Take 500 mg by mouth 2 (two) times daily. This is on patients allergy list no prior reaction noted per patient , she has taken all but a few tablets and voiced no previous allegy that she is aware of.provider notified. (Patient not taking: Reported on 03/19/2022)     Multiple Vitamin (MULTIVITAMIN WITH MINERALS) TABS tablet Take 1 tablet by mouth daily.     Omega-3 Fatty Acids (FISH OIL) 1000 MG CAPS Take 1 capsule by mouth daily.     omeprazole (PRILOSEC) 20 MG capsule TAKE 1 CAPSULE BY MOUTH  EVERY DAY 90 capsule 3   oxyCODONE (ROXICODONE) 15 MG immediate release tablet Take 15 mg by mouth every 6 (six) hours as needed.     prochlorperazine (COMPAZINE) 10 MG tablet Take 1 tablet (10 mg total) by mouth every 6 (six)  hours as needed. 90 tablet 1   VENCLEXTA 100 MG tablet Take 4 tablets (400 mg total) by mouth daily. 30 tablet 0   vitamin B-12 (CYANOCOBALAMIN) 100 MCG tablet Take 100 mcg by mouth daily.     Current Facility-Administered Medications  Medication Dose Route Frequency Provider Last Rate Last Admin   sodium chloride flush (NS) 0.9 % injection 10 mL  10 mL Intracatheter PRN Derwood Kaplan, MD   10 mL at 03/19/22 0840   Facility-Administered Medications Ordered in Other Visits  Medication Dose Route Frequency Provider Last Rate Last Admin   heparin lock flush 100 unit/mL  500 Units Intracatheter Once PRN Derwood Kaplan, MD       influenza vac split quadrivalent PF (FLUARIX) injection 0.5 mL  0.5 mL Intramuscular Once Derwood Kaplan, MD       sodium chloride flush (NS) 0.9 % injection 10 mL  10 mL Intracatheter PRN Eliceo Gladu A, PA-C   10 mL at 09/26/20 0839   sodium chloride flush (NS) 0.9 % injection 10 mL  10 mL Intracatheter Once PRN Derwood Kaplan, MD

## 2022-03-15 NOTE — Assessment & Plan Note (Signed)
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  She wanted to space out her treatments, but we recommended she continue IVIG every 4 weeks through the winter months. She will proceed with IVIG today.  We will plan to see her back in 4 weeks with a CBC and comprehensive metabolic panel prior to her next IVIG.

## 2022-03-18 ENCOUNTER — Ambulatory Visit: Payer: Medicare Other

## 2022-03-18 ENCOUNTER — Ambulatory Visit: Payer: Medicare Other | Admitting: Oncology

## 2022-03-18 ENCOUNTER — Other Ambulatory Visit: Payer: Medicare Other

## 2022-03-18 MED FILL — Immune Globulin (Human) IV Soln 10 GM/100ML: INTRAVENOUS | Qty: 450 | Status: AC

## 2022-03-19 ENCOUNTER — Inpatient Hospital Stay: Payer: Medicare Other

## 2022-03-19 ENCOUNTER — Inpatient Hospital Stay: Payer: Medicare Other | Attending: Hematology and Oncology

## 2022-03-19 ENCOUNTER — Encounter: Payer: Self-pay | Admitting: Hematology and Oncology

## 2022-03-19 ENCOUNTER — Inpatient Hospital Stay (INDEPENDENT_AMBULATORY_CARE_PROVIDER_SITE_OTHER): Payer: Medicare Other | Admitting: Hematology and Oncology

## 2022-03-19 VITALS — BP 130/62 | HR 76 | Temp 97.7°F | Resp 18 | Ht 61.5 in | Wt 221.0 lb

## 2022-03-19 VITALS — BP 169/76 | HR 75 | Temp 98.2°F | Resp 20 | Ht 61.5 in | Wt 221.4 lb

## 2022-03-19 DIAGNOSIS — D801 Nonfamilial hypogammaglobulinemia: Secondary | ICD-10-CM

## 2022-03-19 DIAGNOSIS — C9110 Chronic lymphocytic leukemia of B-cell type not having achieved remission: Secondary | ICD-10-CM | POA: Diagnosis not present

## 2022-03-19 DIAGNOSIS — D819 Combined immunodeficiency, unspecified: Secondary | ICD-10-CM

## 2022-03-19 LAB — HEPATIC FUNCTION PANEL
ALT: 26 U/L (ref 7–35)
AST: 27 (ref 13–35)
Alkaline Phosphatase: 77 (ref 25–125)
Bilirubin, Total: 0.5

## 2022-03-19 LAB — CBC AND DIFFERENTIAL
HCT: 37 (ref 36–46)
Hemoglobin: 12.6 (ref 12.0–16.0)
MCV: 91 (ref 81–99)
Neutrophils Absolute: 1.53
Platelets: 50 10*3/uL — AB (ref 150–400)
WBC: 5.9

## 2022-03-19 LAB — BASIC METABOLIC PANEL
BUN: 15 (ref 4–21)
CO2: 25 — AB (ref 13–22)
Chloride: 108 (ref 99–108)
Creatinine: 0.8 (ref 0.5–1.1)
Glucose: 90
Potassium: 4.1 mEq/L (ref 3.5–5.1)
Sodium: 140 (ref 137–147)

## 2022-03-19 LAB — COMPREHENSIVE METABOLIC PANEL
Albumin: 4.1 (ref 3.5–5.0)
Calcium: 9 (ref 8.7–10.7)

## 2022-03-19 LAB — CBC: RBC: 4.04 (ref 3.87–5.11)

## 2022-03-19 MED ORDER — HEPARIN SOD (PORK) LOCK FLUSH 100 UNIT/ML IV SOLN
500.0000 [IU] | Freq: Once | INTRAVENOUS | Status: AC | PRN
  Administered 2022-03-19: 500 [IU]

## 2022-03-19 MED ORDER — DEXTROSE 5 % IV SOLN: Freq: Once | INTRAVENOUS | Status: AC

## 2022-03-19 MED ORDER — IMMUNE GLOBULIN (HUMAN) 10 GM/100ML IV SOLN
0.9000 g/kg | Freq: Once | INTRAVENOUS | Status: AC
  Administered 2022-03-19: 45 g via INTRAVENOUS
  Filled 2022-03-19: qty 450

## 2022-03-19 MED ORDER — DIPHENHYDRAMINE HCL 50 MG/ML IJ SOLN
25.0000 mg | Freq: Once | INTRAMUSCULAR | Status: AC
  Administered 2022-03-19: 25 mg via INTRAVENOUS
  Filled 2022-03-19: qty 1

## 2022-03-19 MED ORDER — SODIUM CHLORIDE 0.9% FLUSH
10.0000 mL | Freq: Once | INTRAVENOUS | Status: AC | PRN
  Administered 2022-03-19: 10 mL

## 2022-03-19 MED ORDER — ACETAMINOPHEN 325 MG PO TABS
650.0000 mg | ORAL_TABLET | Freq: Once | ORAL | Status: AC
  Administered 2022-03-19: 650 mg via ORAL
  Filled 2022-03-19: qty 2

## 2022-03-19 MED ORDER — SODIUM CHLORIDE 0.9% FLUSH
10.0000 mL | INTRAVENOUS | Status: DC | PRN
  Administered 2022-03-19: 10 mL

## 2022-03-19 NOTE — Patient Instructions (Signed)

## 2022-04-03 ENCOUNTER — Encounter: Payer: Self-pay | Admitting: Oncology

## 2022-04-04 ENCOUNTER — Other Ambulatory Visit: Payer: Self-pay | Admitting: Oncology

## 2022-04-04 DIAGNOSIS — E876 Hypokalemia: Secondary | ICD-10-CM

## 2022-04-04 DIAGNOSIS — K219 Gastro-esophageal reflux disease without esophagitis: Secondary | ICD-10-CM

## 2022-04-04 DIAGNOSIS — T451X5A Adverse effect of antineoplastic and immunosuppressive drugs, initial encounter: Secondary | ICD-10-CM

## 2022-04-04 NOTE — Telephone Encounter (Signed)
Remains on oral chemotherapy

## 2022-04-05 NOTE — Progress Notes (Signed)
[facility]  [address] 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  She did not require treatment until 2008 and has since been through multiple therapies.  She is currently taking venetoclax 400 mg daily with good control of her disease for 5 years.    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  She wanted to space out her treatments, but we recommended she continue IVIG every 4 weeks through the winter months. She will proceed with IVIG today.    Thrombocytopenia Her platelet count has slowly declined over the last few months and was the lowest today at 44,000. I am concerned this may be an indicator of progressive disease, but I find no splenomegaly or adenopathy or other signs of her CLL. We will continue to monitor this closely on a monthly basis. I have cautioned her about the increased risk for bruising or bleeding.   Plan: She will proceed with IVIG today.  We will plan to see her back in 4 weeks with a CBC and comprehensive metabolic panel prior to her next IVIG. I discussed the platelet count with her and will continue to monitor it monthly.  The patient understands the plans discussed today and is in agreement with them.  She knows to contact our office if she develops concerns prior to her next appointment.   I provided 20 minutes of face-to-face time during this encounter and > 50% was spent counseling as documented under my assessment and plan.    Derwood Kaplan, MD  [facility] AT [facility] [address] [place] [address] Dept: [phone] Dept Fax: [phone]   Orders  Placed This Encounter  Procedures   CBC and differential    This external order was created through the Results Console.   CBC    This external order was created through the Results Console.   CBC and differential    This external order was created through the Results Console.   Basic metabolic panel    This external order was created through the Results Console.   Comprehensive metabolic panel    This external order was created through the Results Console.   Hepatic function panel    This external order was created through the Results Console.      CHIEF COMPLAINT:  CC: CLL with combined immunodeficiency  Current Treatment: Venetoclax/IVIG  HISTORY OF PRESENT ILLNESS:  Samantha Warren is a 77 y.o. 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  She was on observation until November 2008. She was initially treated with chlorambucil and prednisone, as she refused intravenous chemotherapy.  She had a partial response to this regimen.  By January 2010, she had progressive disease with a white count over 200,000, with associated anemia, splenomegaly, thrombocytopenia, and adenopathy.  She then received 5 cycles of fludarabine with good response.  She did well until January 2012, when she had progression once again.  She had a single dose of bendamustine and rituximab, which kept her disease under control for over a year.  However, she had a severe allergic reaction to the rituximab, so has not received further rituximab.  She was hospitalized after  the bendamustine because of severe pancytopenia and required multiple transfusions, so was placed on observation.  In March 2013, she had progression of disease again, so was treated with bendamustine for 6 cycles, at a 50% dose reduction, and once again had a excellent response.  She was on observation until August 2014, then had progression of disease, so was placed on ibrutinib 420 mg daily.  The ibrutinib had  kept her disease under fairly good control, but then she develops severe bilateral lower extremity cellulitis in April 2016, so ibrutinib was placed on hold.  She had persistent cellulitis, as well as  Clostridium difficile colitis in May 2016 requiring hospitalization.  She was readmitted soon after discharge with worsening cellulitis, requiring a prolonged hospitalization.  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 She eventually had resolution of the cellulitis   As she was largely asymptomatic, we continued observation until February 2017, at which time she had worsening splenomegaly.  Repeat CT imaging in February showed progressive disease, with marked splenomegaly and bulky lymphadenopathy.  She was then placed back on ibrutinib '420mg'$  daily.  The lymphocytosis, anemia and thrombocytopenia slowly improved.  Due to neutropenia, ibrutinib was placed on hold in December 2017.  The ibrutinib was resumed in January 2018, but discontinued in August 2018 due to toxicities, mainly severe abdominal muscle cramping.  She was hospitalized in early September 2018 with urinary tract infection and pancytopenia.  She received multiple red blood cell and platelet transfusions during her stay.  She was admitted again in late September 2018 with perineal cellulitis extending to the lower abdomen and upper thigh, as well as persistent pancytopenia.  CT abdomen and pelvis revealed interval decrease in the abdominal lymphadenopathy, chronic massive splenomegaly with scattered small splenic infarcts, and small left pleural effusion with bibasilar atelectasis.  She required packed red blood cells while hospitalized.  She was discharged home with home health and followed up with the [facility].  We continued to follow her closely, but did not place her on a treatment due to the persistent cellulitis.  She then was admitted in October 2018 with  severe hypercalcemia, with a calcium of 15.  She was treated with IV fluids, zoledronic acid and Calcitonin injections.  We continued to monitor her closely.  She had recurrent hypercalcemia, for which she received IV fluids and zolendronic acid as an outpatient.     Due to worsening pancytopenia, she underwent bone marrow biopsy in January 2019.   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  The cellulitis had improved to a point we felt she could be treated again, so she was placed on venetoclax CLL in January 2019.  She has continued on venetoclax 400 mg daily after ramp up dosing and has tolerated that fairly well, except for mild diarrhea.  Since starting venetoclax, she has had decreasing splenomegaly and improvement in her pancytopenia.  She initially continued to require IV fluids and zoledronic acid for the hypercalcemia.  Her last dose of zoledronic acid was given in March 2019.  In March, she had an irregular heart rhythm and EKG revealed occasional premature atrial complexes, as well as an incomplete right bundle branch block.  This was stable compared to EKG done in October 2018.  Quantitative immunoglobulins done in August 2019 revealed a normal IgG, with low IgA and IgM.  She has continued IVIG monthly.  She had an  episode of Campylobacter diarrhea in November 2019, which was treated with azithromycin for 7 days with resolution of her diarrhea. Venetoclax was held temporarily until her diarrhea resolved.  She has since tolerated venetoclax without significant difficulty.     In February 2022, she had a right diagnostic mammogram due to right breast subareolar tenderness, which did not reveal any evidence of malignancy.  CT abdomen in April did not reveal any acute abnormality or other findings to explain the left-sided abdominal pain.  There was resolution of previously seen gross splenomegaly.  There were prominent  retroperitoneal lymph nodes, which had significantly diminished in size compared to prior exam.  A hiatal hernia was seen. There was descending colonic diverticulosis without evidence of acute diverticulitis.  Bilateral screening mammogram in May 2022 revealed possible asymmetry in the left breast.  Left diagnostic mammogram and ultrasound in June revealed a cluster of cysts/apocrine cyst in the left breast at 2 o'clock, 3 cm the nipple, measuring 6 x 3 x 6 mm, unchanged from the exam dated May 2021. There were no solid masses or suspicious lesions. She is up to date on colonoscopy with her last colonoscopy being in June 2022 with Dr. Lyda Jester.  This was negative, and repeat in 10 years was advised.    INTERVAL HISTORY:  Samantha Warren is here today for repeat clinical assessment prior to IVIG. She is followed for CLL and has been on Venetoclax for 5 years with good control of her disease. She states she is feeling okay. CBC revealed a platelet count of 44,000, down from 59,000 and yet her white count and hemoglobin were normal. CMP was unremarkable. She denies signs of infection such as sore throat, sinus drainage, cough, or urinary symptoms.  She denies fevers or recurrent chills. She denies pain. She denies nausea, vomiting, chest pain, dyspnea or cough. Her weight has increased 3 pounds over last month .  REVIEW OF SYSTEMS:  Review of Systems  Constitutional: Negative.  Negative for appetite change, chills, diaphoresis, fatigue, fever and unexpected weight change.  HENT:  Negative.  Negative for hearing loss, lump/mass, mouth sores, nosebleeds, sore throat, tinnitus, trouble swallowing and voice change.   Eyes: Negative.  Negative for eye problems and icterus.  Respiratory: Negative.  Negative for chest tightness, cough, hemoptysis, shortness of breath and wheezing.   Cardiovascular: Negative.  Negative for chest pain, leg swelling and palpitations.  Gastrointestinal: Negative.  Negative for abdominal  distention, abdominal pain, blood in stool, constipation, diarrhea, nausea, rectal pain and vomiting.  Endocrine: Negative.  Negative for hot flashes.  Genitourinary: Negative.  Negative for bladder incontinence, difficulty urinating, dyspareunia, dysuria, frequency, hematuria, menstrual problem, nocturia, pelvic pain, vaginal bleeding and vaginal discharge.   Musculoskeletal:  Positive for arthralgias (in both knees). Negative for back pain, flank pain, gait problem, myalgias, neck pain and neck stiffness.  Skin: Negative.  Negative for itching, rash and wound.  Neurological:  Negative for dizziness, extremity weakness, gait problem, headaches, light-headedness, numbness, seizures and speech difficulty.  Hematological: Negative.  Negative for adenopathy. Does not bruise/bleed easily.  Psychiatric/Behavioral: Negative.  Negative for confusion, decreased concentration, depression, sleep disturbance and suicidal ideas. The patient is not nervous/anxious.      VITALS:  Blood pressure 131/75, pulse 72, temperature 98.7 F (37.1 C), temperature source Oral, resp. rate 16, height 5' 1.5" (1.562 m), weight 224 lb 1.6 oz (101.7 kg), SpO2 100 %.  Wt Readings from Last 3 Encounters:  04/15/22 224 lb 12 oz (101.9 kg)  04/15/22 224  lb 1.6 oz (101.7 kg)  03/19/22 221 lb (100.2 kg)    Body mass index is 41.66 kg/m.  Performance status (ECOG): 1 - Symptomatic but completely ambulatory  PHYSICAL EXAM:  Physical Exam Vitals and nursing note reviewed.  Constitutional:      General: She is not in acute distress.    Appearance: Normal appearance. She is normal weight. She is not ill-appearing, toxic-appearing or diaphoretic.  HENT:     Head: Normocephalic and atraumatic.     Right Ear: Tympanic membrane, ear canal and external ear normal. There is no impacted cerumen.     Left Ear: Tympanic membrane and external ear normal. There is no impacted cerumen.     Nose: Nose normal. No congestion or  rhinorrhea.     Mouth/Throat:     Mouth: Mucous membranes are moist.     Pharynx: Oropharynx is clear. No oropharyngeal exudate or posterior oropharyngeal erythema.  Eyes:     General: No scleral icterus.       Right eye: No discharge.        Left eye: No discharge.     Extraocular Movements: Extraocular movements intact.     Conjunctiva/sclera: Conjunctivae normal.     Pupils: Pupils are equal, round, and reactive to light.  Neck:     Vascular: No carotid bruit.  Cardiovascular:     Rate and Rhythm: Normal rate and regular rhythm.     Pulses: Normal pulses.     Heart sounds: Normal heart sounds. No murmur heard.    No friction rub. No gallop.  Pulmonary:     Effort: Pulmonary effort is normal. No respiratory distress.     Breath sounds: Normal breath sounds. No stridor. No wheezing, rhonchi or rales.  Chest:     Chest wall: No tenderness.  Abdominal:     General: Bowel sounds are normal. There is no distension.     Palpations: Abdomen is soft. There is no hepatomegaly, splenomegaly or mass.     Tenderness: There is no abdominal tenderness. There is no right CVA tenderness, left CVA tenderness, guarding or rebound.     Hernia: No hernia is present.  Musculoskeletal:        General: No swelling, tenderness, deformity or signs of injury. Normal range of motion.     Cervical back: Normal range of motion and neck supple. No rigidity or tenderness.     Right lower leg: No edema.     Left lower leg: No edema.  Lymphadenopathy:     Cervical: No cervical adenopathy.     Right cervical: No superficial, deep or posterior cervical adenopathy.    Left cervical: No superficial, deep or posterior cervical adenopathy.     Upper Body:     Right upper body: No supraclavicular, axillary or pectoral adenopathy.     Left upper body: No supraclavicular, axillary or pectoral adenopathy.     Lower Body: No right inguinal adenopathy. No left inguinal adenopathy.  Skin:    General: Skin is warm and  dry.     Coloration: Skin is not jaundiced or pale.     Findings: No bruising, erythema, lesion or rash.  Neurological:     General: No focal deficit present.     Mental Status: She is alert and oriented to person, place, and time. Mental status is at baseline.     Cranial Nerves: No cranial nerve deficit.     Sensory: No sensory deficit.     Motor: No weakness.  Coordination: Coordination normal.     Gait: Gait normal.     Deep Tendon Reflexes: Reflexes normal.  Psychiatric:        Mood and Affect: Mood normal.        Behavior: Behavior normal.        Thought Content: Thought content normal.        Judgment: Judgment normal.    LABS:      Latest Ref Rng & Units 04/15/2022   12:00 AM 03/19/2022   12:00 AM 02/19/2022   12:00 AM  CBC  WBC  6.2     5.9     5.4      Hemoglobin 12.0 - 16.0 12.3     12.6     12.6      Hematocrit 36 - 46 36     37     38      Platelets 150 - 400 K/uL 44     50     59         This result is from an external source.      Latest Ref Rng & Units 04/15/2022   12:00 AM 03/19/2022   12:00 AM 02/19/2022   12:00 AM  CMP  BUN 4 - '21 16     15     25      '$ Creatinine 0.5 - 1.1 0.8     0.8     0.9      Sodium 137 - 147 140     140     138      Potassium 3.5 - 5.1 mEq/L 3.8     4.1     3.9      Chloride 99 - 108 107     108     107      CO2 13 - '22 25     25     23      '$ Calcium 8.7 - 10.7 8.7     9.0     9.0      Alkaline Phos 25 - 125 71     77     85      AST 13 - 35 32     27     27      ALT 7 - 35 U/L '23     26     24         '$ This result is from an external source.     No results found for: "CEA1", "CEA" / No results found for: "CEA1", "CEA" No results found for: "PSA1" No results found for: "LGX211" No results found for: "CAN125"  No results found for: "TOTALPROTELP", "ALBUMINELP", "A1GS", "A2GS", "BETS", "BETA2SER", "GAMS", "MSPIKE", "SPEI" No results found for: "TIBC", "FERRITIN", "IRONPCTSAT" No results found for: "LDH"  STUDIES:   No results found.   /// EXAM: 07/12/21 CT MAXILLOFACIAL WITHOUT CONTRAST  TECHNIQUE: Multidetector CT imaging of the maxillofacial structures was performed. Multiplanar CT image reconstructions were also generated.  RADIATION DOSE REDUCTION: This exam was performed according to the departmental dose-optimization program which includes automated exposure control, adjustment of the mA and/or kV according to patient size and/or use of iterative reconstruction technique.  COMPARISON: None.  FINDINGS: Streak artifact from jewelry patient declined to remove. Streak artifact from dental amalgam.  Osseous: No acute abnormality identified.  Orbits: Unremarkable.  Sinuses: Mild mucosal thickening.  Soft tissues: Suspect asymmetric tissue thickening the anterior aspect of the right mandible.  No abscess identified.  Limited intracranial: No acute abnormality.  IMPRESSION: Evaluation limited by significant streak artifact. Suspect inflammatory changes along the anterior right mandible. No abscess.   HISTORY:   Past Medical History:  Diagnosis Date   Cancer [facility])     Past Surgical History:  Procedure Laterality Date   arm surgery     GALLBLADDER SURGERY     TOTAL KNEE ARTHROPLASTY      Family History  Problem Relation Age of Onset   Cancer Mother    Hypertension Father    Hypertension Sister    Stroke Sister    Hypertension Brother    Stroke Brother     Social History:  reports that she has been smoking cigarettes. She has a 15.00 pack-year smoking history. She has never used smokeless tobacco. She reports current drug use. No history on file for alcohol use.The patient is alone today.  Allergies:  Allergies  Allergen Reactions   Metronidazole     Other reaction(s): Other (See Comments)   Rituximab     Other reaction(s): Other (See Comments)   Sulfa Antibiotics Rash    Current Medications: Current Outpatient Medications  Medication Sig Dispense Refill    alendronate (FOSAMAX) 70 MG tablet      amLODipine (NORVASC) 2.5 MG tablet Take 2.5 mg by mouth daily.     Ascorbic Acid (VITAMIN C WITH ROSE HIPS) 500 MG tablet Take 500 mg by mouth daily.     benzonatate (TESSALON) 100 MG capsule TAKE ONE CAPSULE BY MOUTH 3 TIMES A DAY AS NEEDED 60 capsule 5   calcium carbonate (OSCAL) 1500 (600 Ca) MG TABS tablet Take by mouth.     Cholecalciferol (VITAMIN D3) 125 MCG (5000 UT) TABS Take 1 tablet by mouth daily.     diclofenac Sodium (VOLTAREN) 1 % GEL Apply topically.     diphenoxylate-atropine (LOMOTIL) 2.5-0.025 MG tablet Take 1-2 tablets by mouth every 6 (six) hours.     ibuprofen (ADVIL) 800 MG tablet      KLOR-CON M20 20 MEQ tablet TAKE 1 TABLET BY MOUTH 3 TIMES DAILY. 270 tablet 1   lidocaine (LIDODERM) 5 % 2 patches daily.     lidocaine-prilocaine (EMLA) cream Apply 1 Application topically as needed. 30 g 5   metroNIDAZOLE (FLAGYL) 500 MG tablet Take 500 mg by mouth 2 (two) times daily. This is on patients allergy list no prior reaction noted per patient , she has taken all but a few tablets and voiced no previous allegy that she is aware of.provider notified. (Patient not taking: Reported on 03/19/2022)     Multiple Vitamin (MULTIVITAMIN WITH MINERALS) TABS tablet Take 1 tablet by mouth daily.     Omega-3 Fatty Acids (FISH OIL) 1000 MG CAPS Take 1 capsule by mouth daily.     omeprazole (PRILOSEC) 20 MG capsule TAKE 1 CAPSULE BY MOUTH EVERY DAY 90 capsule 3   oxyCODONE (ROXICODONE) 15 MG immediate release tablet Take 15 mg by mouth every 6 (six) hours as needed.     prochlorperazine (COMPAZINE) 10 MG tablet TAKE 1 TABLET BY MOUTH EVERY 6 HOURS AS NEEDED 90 tablet 1   VENCLEXTA 100 MG tablet Take 4 tablets (400 mg total) by mouth daily. 30 tablet 0   vitamin B-12 (CYANOCOBALAMIN) 100 MCG tablet Take 100 mcg by mouth daily.     Current Facility-Administered Medications  Medication Dose Route Frequency Provider Last Rate Last Admin   sodium chloride  flush (NS) 0.9 % injection 10 mL  10 mL Intracatheter PRN Derwood Kaplan, MD   10 mL at 04/15/22 0847   Facility-Administered Medications Ordered in Other Visits  Medication Dose Route Frequency Provider Last Rate Last Admin   influenza vac split quadrivalent PF (FLUARIX) injection 0.5 mL  0.5 mL Intramuscular Once Derwood Kaplan, MD       sodium chloride flush (NS) 0.9 % injection 10 mL  10 mL Intracatheter PRN Rosanne Sack A, PA-C   10 mL at 09/26/20 2182       I,Jasmine M Lassiter,acting as a scribe for Derwood Kaplan, MD.,have documented all relevant documentation on the behalf of Derwood Kaplan, MD,as directed by  Derwood Kaplan, MD while in the presence of Derwood Kaplan, MD.

## 2022-04-10 ENCOUNTER — Encounter: Payer: Self-pay | Admitting: Oncology

## 2022-04-12 ENCOUNTER — Encounter: Payer: Self-pay | Admitting: Oncology

## 2022-04-12 MED FILL — Immune Globulin (Human) IV Soln 10 GM/100ML: INTRAVENOUS | Qty: 450 | Status: AC

## 2022-04-15 ENCOUNTER — Inpatient Hospital Stay: Payer: Medicare Other

## 2022-04-15 ENCOUNTER — Other Ambulatory Visit: Payer: Self-pay | Admitting: Oncology

## 2022-04-15 ENCOUNTER — Inpatient Hospital Stay (INDEPENDENT_AMBULATORY_CARE_PROVIDER_SITE_OTHER): Payer: Medicare Other | Admitting: Oncology

## 2022-04-15 ENCOUNTER — Telehealth: Payer: Self-pay | Admitting: Oncology

## 2022-04-15 ENCOUNTER — Inpatient Hospital Stay: Payer: Medicare Other | Attending: Hematology and Oncology

## 2022-04-15 ENCOUNTER — Encounter: Payer: Self-pay | Admitting: Oncology

## 2022-04-15 VITALS — BP 131/75 | HR 72 | Temp 98.7°F | Resp 16 | Ht 61.5 in | Wt 224.1 lb

## 2022-04-15 VITALS — BP 125/71 | HR 77 | Temp 98.0°F | Resp 18 | Ht 61.5 in | Wt 224.8 lb

## 2022-04-15 DIAGNOSIS — C9110 Chronic lymphocytic leukemia of B-cell type not having achieved remission: Secondary | ICD-10-CM

## 2022-04-15 DIAGNOSIS — D801 Nonfamilial hypogammaglobulinemia: Secondary | ICD-10-CM | POA: Diagnosis not present

## 2022-04-15 DIAGNOSIS — D61818 Other pancytopenia: Secondary | ICD-10-CM | POA: Diagnosis not present

## 2022-04-15 DIAGNOSIS — D819 Combined immunodeficiency, unspecified: Secondary | ICD-10-CM | POA: Insufficient documentation

## 2022-04-15 LAB — CBC AND DIFFERENTIAL
HCT: 36 (ref 36–46)
Hemoglobin: 12.3 (ref 12.0–16.0)
Neutrophils Absolute: 1.3
Neutrophils Absolute: 1.36
Platelets: 44 10*3/uL — AB (ref 150–400)
WBC: 6.2

## 2022-04-15 LAB — CBC: RBC: 3.93 (ref 3.87–5.11)

## 2022-04-15 LAB — BASIC METABOLIC PANEL
BUN: 16 (ref 4–21)
CO2: 25 — AB (ref 13–22)
Chloride: 107 (ref 99–108)
Creatinine: 0.8 (ref 0.5–1.1)
Glucose: 87
Potassium: 3.8 mEq/L (ref 3.5–5.1)
Sodium: 140 (ref 137–147)

## 2022-04-15 LAB — HEPATIC FUNCTION PANEL
ALT: 23 U/L (ref 7–35)
AST: 32 (ref 13–35)
Alkaline Phosphatase: 71 (ref 25–125)
Bilirubin, Total: 0.5

## 2022-04-15 LAB — COMPREHENSIVE METABOLIC PANEL
Albumin: 4.1 (ref 3.5–5.0)
Calcium: 8.7 (ref 8.7–10.7)

## 2022-04-15 MED ORDER — DIPHENHYDRAMINE HCL 50 MG/ML IJ SOLN
25.0000 mg | Freq: Once | INTRAMUSCULAR | Status: AC
  Administered 2022-04-15: 25 mg via INTRAVENOUS
  Filled 2022-04-15: qty 1

## 2022-04-15 MED ORDER — SODIUM CHLORIDE 0.9% FLUSH: 10.0000 mL | Freq: Once | INTRAVENOUS | Status: DC | PRN

## 2022-04-15 MED ORDER — SODIUM CHLORIDE 0.9% FLUSH
10.0000 mL | INTRAVENOUS | Status: DC | PRN
  Administered 2022-04-15: 10 mL

## 2022-04-15 MED ORDER — DEXTROSE 5 % IV SOLN: Freq: Once | INTRAVENOUS | Status: DC

## 2022-04-15 MED ORDER — ACETAMINOPHEN 325 MG PO TABS
650.0000 mg | ORAL_TABLET | Freq: Once | ORAL | Status: AC
  Administered 2022-04-15: 650 mg via ORAL
  Filled 2022-04-15: qty 2

## 2022-04-15 MED ORDER — HEPARIN SOD (PORK) LOCK FLUSH 100 UNIT/ML IV SOLN
500.0000 [IU] | Freq: Once | INTRAVENOUS | Status: AC | PRN
  Administered 2022-04-15: 500 [IU]

## 2022-04-15 MED ORDER — HEPARIN SOD (PORK) LOCK FLUSH 100 UNIT/ML IV SOLN: 500.0000 [IU] | Freq: Once | INTRAVENOUS | Status: DC | PRN

## 2022-04-15 MED ORDER — IMMUNE GLOBULIN (HUMAN) 10 GM/100ML IV SOLN
0.9000 g/kg | Freq: Once | INTRAVENOUS | Status: AC
  Administered 2022-04-15: 45 g via INTRAVENOUS
  Filled 2022-04-15: qty 50

## 2022-04-15 NOTE — Telephone Encounter (Signed)
Patient has been scheduled for follow-up visit per 04/15/22 LOS.  Pt given an appt calendar with date and time.

## 2022-04-15 NOTE — Patient Instructions (Signed)

## 2022-04-26 ENCOUNTER — Other Ambulatory Visit: Payer: Self-pay

## 2022-04-26 DIAGNOSIS — C9110 Chronic lymphocytic leukemia of B-cell type not having achieved remission: Secondary | ICD-10-CM

## 2022-04-30 ENCOUNTER — Encounter: Payer: Self-pay | Admitting: Oncology

## 2022-04-30 ENCOUNTER — Other Ambulatory Visit (HOSPITAL_COMMUNITY): Payer: Self-pay

## 2022-04-30 MED ORDER — VENCLEXTA 100 MG PO TABS: 400.0000 mg | ORAL_TABLET | Freq: Every day | ORAL | 0 refills | Status: DC

## 2022-05-05 ENCOUNTER — Encounter: Payer: Self-pay | Admitting: Oncology

## 2022-05-07 ENCOUNTER — Encounter: Payer: Self-pay | Admitting: Oncology

## 2022-05-07 ENCOUNTER — Encounter: Payer: Self-pay | Admitting: Hematology and Oncology

## 2022-05-07 ENCOUNTER — Telehealth: Payer: Self-pay

## 2022-05-07 ENCOUNTER — Inpatient Hospital Stay: Payer: Medicare Other

## 2022-05-07 ENCOUNTER — Inpatient Hospital Stay: Payer: Medicare Other | Attending: Hematology and Oncology | Admitting: Hematology and Oncology

## 2022-05-07 VITALS — BP 148/77 | HR 82 | Temp 98.5°F | Resp 20 | Ht 61.5 in | Wt 224.1 lb

## 2022-05-07 DIAGNOSIS — D819 Combined immunodeficiency, unspecified: Secondary | ICD-10-CM | POA: Insufficient documentation

## 2022-05-07 DIAGNOSIS — R600 Localized edema: Secondary | ICD-10-CM | POA: Diagnosis not present

## 2022-05-07 DIAGNOSIS — C9110 Chronic lymphocytic leukemia of B-cell type not having achieved remission: Secondary | ICD-10-CM

## 2022-05-07 DIAGNOSIS — D801 Nonfamilial hypogammaglobulinemia: Secondary | ICD-10-CM | POA: Diagnosis not present

## 2022-05-07 LAB — CBC AND DIFFERENTIAL
HCT: 35 — AB (ref 36–46)
Hemoglobin: 11.3 — AB (ref 12.0–16.0)
MCV: 92 (ref 81–99)
Neutrophils Absolute: 1.3
Platelets: 50 10*3/uL — AB (ref 150–400)
WBC: 6.5

## 2022-05-07 LAB — BASIC METABOLIC PANEL
BUN: 17 (ref 4–21)
CO2: 21 (ref 13–22)
Chloride: 110 — AB (ref 99–108)
Creatinine: 0.8 (ref 0.5–1.1)
Glucose: 95
Potassium: 3.8 mEq/L (ref 3.5–5.1)
Sodium: 141 (ref 137–147)

## 2022-05-07 LAB — HEPATIC FUNCTION PANEL
ALT: 18 U/L (ref 7–35)
AST: 38 — AB (ref 13–35)
Alkaline Phosphatase: 78 (ref 25–125)
Bilirubin, Total: 0.4

## 2022-05-07 LAB — COMPREHENSIVE METABOLIC PANEL
Albumin: 4.1 (ref 3.5–5.0)
Calcium: 8.5 — AB (ref 8.7–10.7)

## 2022-05-07 LAB — CBC: RBC: 3.75 — AB (ref 3.87–5.11)

## 2022-05-07 MED ORDER — HEPARIN SOD (PORK) LOCK FLUSH 100 UNIT/ML IV SOLN
500.0000 [IU] | Freq: Once | INTRAVENOUS | Status: AC | PRN
  Administered 2022-05-07: 500 [IU]

## 2022-05-07 MED ORDER — CLINDAMYCIN HCL 300 MG PO CAPS: 300.0000 mg | ORAL_CAPSULE | Freq: Three times a day (TID) | ORAL | 0 refills | Status: DC

## 2022-05-07 MED ORDER — SODIUM CHLORIDE 0.9% FLUSH
10.0000 mL | INTRAVENOUS | Status: DC | PRN
  Administered 2022-05-07: 10 mL

## 2022-05-07 NOTE — Assessment & Plan Note (Signed)
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  She wanted to space out her treatments, but we recommended she continue IVIG every 4 weeks through the winter months.  She is due for IVIG again and 1 week.  We will keep that appointment as scheduled.

## 2022-05-07 NOTE — Progress Notes (Signed)
[facility]  [address] [place],  [place]  [address] [phone]  Clinic Day:  05/07/2022  Referring physician: [name], MD  ASSESSMENT & PLAN:   Assessment & Plan: Submandibular gland swelling Right submandibular swelling with tenderness to palpation, but no increased pain with eating. We will try a course of clindamycin and if not improved, will consider biopsy.  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  She did not require treatment until 2008 and has since been through multiple therapies.  She is currently taking venetoclax 400 mg daily with good control of her disease for over 4 years. Her blood counts remain fairly stable. We will keep her appointment in 1 week with labs as scheduled, and reassess the submandibular swelling.  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  She wanted to space out her treatments, but we recommended she continue IVIG every 4 weeks through the winter months.  She is due for IVIG again and 1 week.  We will keep that appointment as scheduled.    The patient understands the plans discussed today and is in agreement with them.  She knows to contact our office if she develops concerns prior to her next appointment.  The patient was seen, examined and plan formulated with Dr. Bobby Warren.   Samantha Pickles, PA-C  [facility] AT [facility] [address] [place] [address] Dept: [phone] Dept Fax: [phone]   Orders Placed This Encounter  Procedures   CBC and differential    This external order was created through the Results Console.   CBC    This external order was created through the Results Console.   Basic metabolic panel    This external order was created through the Results Console.    Comprehensive metabolic panel    This external order was created through the Results Console.   Hepatic function panel    This external order was created through the Results Console.      CHIEF COMPLAINT:  CC: Chronic lymphocytic leukemia  Current Treatment: Venetoclax 400 mg daily  HISTORY OF PRESENT ILLNESS:  Samantha Warren is a 77 y.o. 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  She was on observation until November 2008. She was initially treated with chlorambucil and prednisone, as she refused intravenous chemotherapy.  She had a partial response to this regimen.  By January 2010, she had progressive disease with a white count over 200,000, with associated anemia, splenomegaly, thrombocytopenia, and adenopathy.  She then received 5 cycles of fludarabine with good response.  She did well until January 2012, when she had progression once again.  She had a single dose of bendamustine and rituximab, which kept her disease under control for over a year.  However, she had a severe allergic reaction to the rituximab, so has not received further rituximab.  She was hospitalized after the bendamustine because of severe pancytopenia and required multiple transfusions, so was placed on observation.  In March 2013, she had progression of disease again, so was treated with bendamustine for 6 cycles, at a 50% dose reduction, and once again had a excellent response.  She was on observation until August 2014, then had progression of disease, so was placed on ibrutinib 420 mg daily.  The ibrutinib had kept her disease under fairly  good control, but then she develops severe bilateral lower extremity cellulitis in April 2016, so ibrutinib was placed on hold.  She had persistent cellulitis, as well as  Clostridium difficile colitis in May 2016 requiring hospitalization.  She was readmitted soon after discharge with worsening cellulitis, requiring a prolonged hospitalization.   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 She eventually had resolution of the cellulitis   As she was largely asymptomatic, we continued observation until February 2017, at which time she had worsening splenomegaly.  Repeat CT imaging in February showed progressive disease, with marked splenomegaly and bulky lymphadenopathy.  She was then placed back on ibrutinib 458m daily.  The lymphocytosis, anemia and thrombocytopenia slowly improved.  Due to neutropenia, ibrutinib was placed on hold in December 2017.  The ibrutinib was resumed in January 2018, but discontinued in August 2018 due to toxicities, mainly severe abdominal muscle cramping.  She was hospitalized in early September 2018 with urinary tract infection and pancytopenia.  She received multiple red blood cell and platelet transfusions during her stay.  She was admitted again in late September 2018 with perineal cellulitis extending to the lower abdomen and upper thigh, as well as persistent pancytopenia.  CT abdomen and pelvis revealed interval decrease in the abdominal lymphadenopathy, chronic massive splenomegaly with scattered small splenic infarcts, and small left pleural effusion with bibasilar atelectasis.  She required packed red blood cells while hospitalized.  She was discharged home with home health and followed up with the [facility]  We continued to follow her closely, but did not place her on a treatment due to the persistent cellulitis.  She then was admitted in October 2018 with severe hypercalcemia, with a calcium of 15.  She was treated with IV fluids, zoledronic acid and Calcitonin injections.  We continued to monitor her closely.  She had recurrent hypercalcemia, for which she received IV fluids and zolendronic acid as an outpatient.     Due to worsening pancytopenia, she underwent bone marrow biopsy in January 2019.   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  The cellulitis had improved to a point we felt she could be treated again, so she was placed on venetoclax CLL in January 2019.  She has continued on venetoclax 400 mg daily after ramp up dosing and has tolerated that fairly well, except for mild diarrhea.  Since starting venetoclax, she has had decreasing splenomegaly and improvement in her pancytopenia.  She initially continued to require IV fluids and zoledronic acid for the hypercalcemia.  Her last dose of zoledronic acid was given in March 2019.  In March, she had an irregular heart rhythm and EKG revealed occasional premature atrial complexes, as well as an incomplete right bundle branch block.  This was stable compared to EKG done in October 2018.  Quantitative immunoglobulins done in August 2019 revealed a normal IgG, with low IgA and IgM.  She has continued IVIG monthly.  She had an episode of Campylobacter diarrhea in November 2019, which was treated with azithromycin for 7 days with resolution of her diarrhea. Venetoclax was held temporarily until her diarrhea resolved.  She has since tolerated venetoclax without significant difficulty.     In February 2022, she had a right diagnostic mammogram due to right breast subareolar tenderness, which did not reveal any evidence of malignancy.  CT abdomen in April did not reveal any acute abnormality  or other findings to explain the left-sided abdominal pain.  There was resolution of previously seen gross splenomegaly.  There were prominent retroperitoneal lymph nodes, which had significantly diminished in size compared to prior exam.  A hiatal hernia was seen. There was descending colonic diverticulosis without evidence of acute diverticulitis.  Bilateral screening mammogram in May 2022 revealed possible asymmetry in the left breast.  Left diagnostic mammogram and ultrasound in June 2022 revealed a cluster of  cysts/apocrine cyst in the left breast at 2 o'clock, 3 cm the nipple, measuring 6 x 3 x 6 mm, unchanged from the exam dated May 2021. There were no solid masses or suspicious lesions. She is up to date on colonoscopy with her last colonoscopy being in June 2022 with Dr. Lyda Jester.  This was negative, and repeat in 10 years was advised.    She had a episode of right face/jaw cellulitis in April 2023 felt to be due to poor dentition.  She was treated with penicillin, but eventually required extraction.  Bilateral screening mammogram in May 2023 did not reveal any evidence of malignancy.  There were scattered areas of fibroglandular density.  She had removal of a vulvar condyloma and May 2023.  Pathology did not reveal dense of high-grade dysplasia or malignancy.  She has continued venetoclax without significant difficulty with good control of her disease.  She has continued monthly IVIG for combined immunodeficiency related to her CLL.  INTERVAL HISTORY:  Samantha Warren is added to the schedule today as she reports a lump under her chin.  She denies redness, tenderness or warmth of the area.  She denies increased pain in the area with eating.  She denies difficulty swallowing.  She denies other palpable adenopathy.  She states there was not swelling in this area with infection last year.  She denies fevers, chills or night sweats. She denies pain. Her appetite is good. Her weight has been stable.  She has a chronic cough, for which she uses benzonatate as needed.  She is due for her next IVIG in 1 week.  She states that she has started walking on treadmill which has increased her strength and helped her knee pain.  She reminds me that she does smoke on occasion.  REVIEW OF SYSTEMS:  Review of Systems  Constitutional:  Negative for appetite change, chills, fatigue, fever and unexpected weight change.  HENT:   Positive for lump/mass. Negative for mouth sores, sore throat and trouble swallowing.   Respiratory:   Positive for cough. Negative for shortness of breath.   Cardiovascular:  Negative for chest pain and leg swelling.  Gastrointestinal:  Negative for abdominal pain, constipation, diarrhea, nausea and vomiting.  Endocrine: Negative for hot flashes.  Genitourinary:  Negative for difficulty urinating, dysuria, frequency and hematuria.   Musculoskeletal:  Positive for arthralgias. Negative for back pain and myalgias.  Skin:  Negative for rash.  Neurological:  Negative for dizziness and headaches.  Hematological:  Negative for adenopathy. Does not bruise/bleed easily.  Psychiatric/Behavioral:  Negative for depression and sleep disturbance. The patient is not nervous/anxious.      VITALS:  Blood pressure (!) 148/77, pulse 82, temperature 98.5 F (36.9 C), temperature source Oral, resp. rate 20, height 5' 1.5" (1.562 m), weight 224 lb 1.6 oz (101.7 kg), SpO2 100 %.  Wt Readings from Last 3 Encounters:  05/07/22 224 lb 1.6 oz (101.7 kg)  04/15/22 224 lb 12 oz (101.9 kg)  04/15/22 224 lb 1.6 oz (101.7 kg)    Body mass  index is 41.66 kg/m.  Performance status (ECOG): 1 - Symptomatic but completely ambulatory  PHYSICAL EXAM:  Physical Exam Vitals and nursing note reviewed.  Constitutional:      General: She is not in acute distress.    Appearance: Normal appearance.  HENT:     Head: Normocephalic and atraumatic.     Salivary Glands: Right salivary gland is diffusely enlarged and tender.     Comments: 2 to 3 cm mildly tender, firm mass in the right submandibular area    Mouth/Throat:     Mouth: Mucous membranes are moist.     Pharynx: Oropharynx is clear. No oropharyngeal exudate or posterior oropharyngeal erythema.     Comments: She has poor dentition.  There is no obvious abscess. Eyes:     General: No scleral icterus.    Extraocular Movements: Extraocular movements intact.     Conjunctiva/sclera: Conjunctivae normal.     Pupils: Pupils are equal, round, and reactive to light.   Cardiovascular:     Rate and Rhythm: Normal rate and regular rhythm.     Heart sounds: Normal heart sounds. No murmur heard.    No friction rub. No gallop.  Pulmonary:     Effort: Pulmonary effort is normal.     Breath sounds: Normal breath sounds. No wheezing, rhonchi or rales.  Abdominal:     General: There is no distension.     Palpations: Abdomen is soft. There is no hepatomegaly, splenomegaly or mass.     Tenderness: There is no abdominal tenderness.  Musculoskeletal:        General: Normal range of motion.     Cervical back: Normal range of motion and neck supple. No tenderness.     Right lower leg: No edema.     Left lower leg: No edema.  Lymphadenopathy:     Cervical: No cervical adenopathy.     Upper Body:     Right upper body: No supraclavicular or axillary adenopathy.     Left upper body: No supraclavicular or axillary adenopathy.  Skin:    General: Skin is warm and dry.     Coloration: Skin is not jaundiced.     Findings: No rash.  Neurological:     Mental Status: She is alert and oriented to person, place, and time.     Cranial Nerves: No cranial nerve deficit.  Psychiatric:        Mood and Affect: Mood normal.        Behavior: Behavior normal.        Thought Content: Thought content normal.     LABS:      Latest Ref Rng & Units 05/07/2022   12:00 AM 04/15/2022   12:00 AM 03/19/2022   12:00 AM  CBC  WBC  6.5     6.2     5.9      Hemoglobin 12.0 - 16.0 11.3     12.3     12.6      Hematocrit 36 - 46 35     36     37      Platelets 150 - 400 K/uL 50     44     50         This result is from an external source.      Latest Ref Rng & Units 05/07/2022   12:00 AM 04/15/2022   12:00 AM 03/19/2022   12:00 AM  CMP  BUN 4 - 21 17  16     15      Creatinine 0.5 - 1.1 0.8     0.8     0.8      Sodium 137 - 147 141     140     140      Potassium 3.5 - 5.1 mEq/L 3.8     3.8     4.1      Chloride 99 - 108 110     107     108      CO2 13 - 22 21     25     25       $ Calcium 8.7 - 10.7 8.5     8.7     9.0      Alkaline Phos 25 - 125 78     71     77      AST 13 - 35 38     32     27      ALT 7 - 35 U/L 18     23     26         $ This result is from an external source.     No results found for: "CEA1", "CEA" / No results found for: "CEA1", "CEA" No results found for: "PSA1" No results found for: "[id number]" No results found for: "CAN125"  No results found for: "TOTALPROTELP", "ALBUMINELP", "A1GS", "A2GS", "BETS", "BETA2SER", "GAMS", "MSPIKE", "SPEI" No results found for: "TIBC", "FERRITIN", "IRONPCTSAT" No results found for: "LDH"  STUDIES:  No results found.    HISTORY:   Past Medical History:  Diagnosis Date   Cancer [facility])     Past Surgical History:  Procedure Laterality Date   arm surgery     GALLBLADDER SURGERY     TOTAL KNEE ARTHROPLASTY      Family History  Problem Relation Age of Onset   Cancer Mother    Hypertension Father    Hypertension Sister    Stroke Sister    Hypertension Brother    Stroke Brother     Social History:  reports that she has been smoking cigarettes. She has a 15.00 pack-year smoking history. She has never used smokeless tobacco. She reports current drug use. No history on file for alcohol use.The patient is alone today.  Allergies:  Allergies  Allergen Reactions   Metronidazole     Other reaction(s): Other (See Comments)   Rituximab     Other reaction(s): Other (See Comments)   Sulfa Antibiotics Rash    Current Medications: Current Outpatient Medications  Medication Sig Dispense Refill   clindamycin (CLEOCIN) 300 MG capsule Take 1 capsule (300 mg total) by mouth 3 (three) times daily. 21 capsule 0   alendronate (FOSAMAX) 70 MG tablet      amLODipine (NORVASC) 2.5 MG tablet Take 2.5 mg by mouth daily.     Ascorbic Acid (VITAMIN C WITH ROSE HIPS) 500 MG tablet Take 500 mg by mouth daily.     benzonatate (TESSALON) 100 MG capsule TAKE ONE CAPSULE BY MOUTH 3 TIMES A DAY AS NEEDED 60 capsule 5    calcium carbonate (OSCAL) 1500 (600 Ca) MG TABS tablet Take by mouth.     Cholecalciferol (VITAMIN D3) 125 MCG (5000 UT) TABS Take 1 tablet by mouth daily.     diclofenac Sodium (VOLTAREN) 1 % GEL Apply topically.     diphenoxylate-atropine (LOMOTIL) 2.5-0.025 MG tablet Take 1-2 tablets by mouth every 6 (six) hours.  ibuprofen (ADVIL) 800 MG tablet      KLOR-CON M20 20 MEQ tablet TAKE 1 TABLET BY MOUTH 3 TIMES DAILY. (Patient taking differently: Take 20 mEq by mouth 3 (three) times daily. Patient voiced currently taking once daily provider aware) 270 tablet 1   lidocaine (LIDODERM) 5 % 2 patches daily.     lidocaine-prilocaine (EMLA) cream Apply 1 Application topically as needed. 30 g 5   Multiple Vitamin (MULTIVITAMIN WITH MINERALS) TABS tablet Take 1 tablet by mouth daily.     Omega-3 Fatty Acids (FISH OIL) 1000 MG CAPS Take 1 capsule by mouth daily.     omeprazole (PRILOSEC) 20 MG capsule TAKE 1 CAPSULE BY MOUTH EVERY DAY 90 capsule 3   oxyCODONE (ROXICODONE) 15 MG immediate release tablet Take 15 mg by mouth every 6 (six) hours as needed.     prochlorperazine (COMPAZINE) 10 MG tablet TAKE 1 TABLET BY MOUTH EVERY 6 HOURS AS NEEDED 90 tablet 1   VENCLEXTA 100 MG tablet Take 4 tablets (400 mg total) by mouth daily. 30 tablet 0   vitamin B-12 (CYANOCOBALAMIN) 100 MCG tablet Take 100 mcg by mouth daily.     Current Facility-Administered Medications  Medication Dose Route Frequency Provider Last Rate Last Admin   sodium chloride flush (NS) 0.9 % injection 10 mL  10 mL Intracatheter PRN Derwood Kaplan, MD   10 mL at 05/07/22 1410   Facility-Administered Medications Ordered in Other Visits  Medication Dose Route Frequency Provider Last Rate Last Admin   influenza vac split quadrivalent PF (FLUARIX) injection 0.5 mL  0.5 mL Intramuscular Once Derwood Kaplan, MD       sodium chloride flush (NS) 0.9 % injection 10 mL  10 mL Intracatheter PRN Wendee Hata, Vida Roller A, PA-C   10 mL at 09/26/20  [phone]

## 2022-05-07 NOTE — Addendum Note (Signed)
Addended by: Juanetta Beets on: 05/07/2022 02:57 PM   Modules accepted: Orders

## 2022-05-07 NOTE — Assessment & Plan Note (Signed)
Right submandibular swelling with tenderness to palpation, but no increased pain with eating. We will try a course of clindamycin and if not improved, will consider biopsy.

## 2022-05-07 NOTE — Assessment & Plan Note (Signed)
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  She did not require treatment until 2008 and has since been through multiple therapies.  She is currently taking venetoclax 400 mg daily with good control of her disease for over 4 years. Her blood counts remain fairly stable. We will keep her appointment in 1 week with labs as scheduled, and reassess the submandibular swelling.

## 2022-05-07 NOTE — Telephone Encounter (Signed)
Pt reports she has a new knot under her chin, for about a week. It is not red, nor warm to touch. It isn't tender, unless you touch a lot. She denies night sweats, fevers, and any other swelling of nodes. "I just want Samantha Warren or Samantha Warren to check it out". Pt told I would send message to you and call her back.

## 2022-05-09 NOTE — Progress Notes (Signed)
[facility]  [address] 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  She did not require treatment until 2008 and has since been through multiple therapies.  She is currently taking venetoclax 400 mg daily with good control of her disease for 5 years.    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  She wanted to space out her treatments, but we recommended she continue IVIG every 4 weeks through the winter months. She will proceed with IVIG today.  We will repeat her immunoglobulin levels today.   Thrombocytopenia Her platelet count has slowly declined over the last few months and was the lowest today at 46,000. I am concerned this may be an indicator of progressive disease, but I find no splenomegaly or adenopathy or other signs of her CLL. We will continue to monitor this closely on a monthly basis. I have cautioned her about the increased risk for bruising or bleeding.   Plan: She will proceed with IVIG today.  We will plan to see her back in 4 weeks with a CBC and comprehensive metabolic panel. The patient understands the plans discussed today and is in agreement with them.  She knows to contact our office if she develops concerns prior to her next appointment.   I provided 20 minutes of face-to-face time during this encounter and > 50% was spent counseling as documented under my assessment and plan.    Derwood Kaplan, MD  [facility] AT [facility] [address] [place] [address] Dept: [phone] Dept Fax: [phone]   Orders Placed This Encounter  Procedures   CBC and differential     This external order was created through the Results Console.   CBC    This external order was created through the Results Console.   Basic metabolic panel    This external order was created through the Results Console.   Comprehensive metabolic panel    This external order was created through the Results Console.   Hepatic function panel    This external order was created through the Results Console.      CHIEF COMPLAINT:  CC: CLL with combined immunodeficiency  Current Treatment: Venetoclax/IVIG  HISTORY OF PRESENT ILLNESS:  Samantha Warren is a 77 y.o. 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  She was on observation until November 2008. She was initially treated with chlorambucil and prednisone, as she refused intravenous chemotherapy.  She had a partial response to this regimen.  By January 2010, she had progressive disease with a white count over 200,000, with associated anemia, splenomegaly, thrombocytopenia, and adenopathy.  She then received 5 cycles of fludarabine with good response.  She did well until January 2012, when she had progression once again.  She had a single dose of bendamustine and rituximab, which kept her disease under control for over a year.  However, she had a severe allergic reaction to the rituximab, so has not received further rituximab.  She was hospitalized after the bendamustine because of severe pancytopenia and required multiple transfusions, so was placed on observation.  In March 2013, she had progression of disease again, so was treated with bendamustine for  6 cycles, at a 50% dose reduction, and once again had a excellent response.  She was on observation until August 2014, then had progression of disease, so was placed on ibrutinib 420 mg daily.  The ibrutinib had kept her disease under fairly good control, but then she develops severe bilateral lower extremity cellulitis in April 2016, so ibrutinib was placed on  hold.  She had persistent cellulitis, as well as  Clostridium difficile colitis in May 2016 requiring hospitalization.  She was readmitted soon after discharge with worsening cellulitis, requiring a prolonged hospitalization.  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 She eventually had resolution of the cellulitis   As she was largely asymptomatic, we continued observation until February 2017, at which time she had worsening splenomegaly.  Repeat CT imaging in February showed progressive disease, with marked splenomegaly and bulky lymphadenopathy.  She was then placed back on ibrutinib '420mg'$  daily.  The lymphocytosis, anemia and thrombocytopenia slowly improved.  Due to neutropenia, ibrutinib was placed on hold in December 2017.  The ibrutinib was resumed in January 2018, but discontinued in August 2018 due to toxicities, mainly severe abdominal muscle cramping.  She was hospitalized in early September 2018 with urinary tract infection and pancytopenia.  She received multiple red blood cell and platelet transfusions during her stay.  She was admitted again in late September 2018 with perineal cellulitis extending to the lower abdomen and upper thigh, as well as persistent pancytopenia.  CT abdomen and pelvis revealed interval decrease in the abdominal lymphadenopathy, chronic massive splenomegaly with scattered small splenic infarcts, and small left pleural effusion with bibasilar atelectasis.  She required packed red blood cells while hospitalized.  She was discharged home with home health and followed up with the [facility].  We continued to follow her closely, but did not place her on a treatment due to the persistent cellulitis.  She then was admitted in October 2018 with severe hypercalcemia, with a calcium of 15.  She was treated with IV fluids, zoledronic acid and Calcitonin injections.  We continued to monitor her  closely.  She had recurrent hypercalcemia, for which she received IV fluids and zolendronic acid as an outpatient.     Due to worsening pancytopenia, she underwent bone marrow biopsy in January 2019.   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  The cellulitis had improved to a point we felt she could be treated again, so she was placed on venetoclax CLL in January 2019.  She has continued on venetoclax 400 mg daily after ramp up dosing and has tolerated that fairly well, except for mild diarrhea.  Since starting venetoclax, she has had decreasing splenomegaly and improvement in her pancytopenia.  She initially continued to require IV fluids and zoledronic acid for the hypercalcemia.  Her last dose of zoledronic acid was given in March 2019.  In March, she had an irregular heart rhythm and EKG revealed occasional premature atrial complexes, as well as an incomplete right bundle branch block.  This was stable compared to EKG done in October 2018.  Quantitative immunoglobulins done in August 2019 revealed a normal IgG, with low IgA and IgM.  She has continued IVIG monthly.  She had an episode of Campylobacter diarrhea in November 2019, which was treated with azithromycin for 7 days with resolution of her diarrhea. Venetoclax was held temporarily until her diarrhea resolved.  She has  since tolerated venetoclax without significant difficulty.     In February 2022, she had a right diagnostic mammogram due to right breast subareolar tenderness, which did not reveal any evidence of malignancy.  CT abdomen in April did not reveal any acute abnormality or other findings to explain the left-sided abdominal pain.  There was resolution of previously seen gross splenomegaly.  There were prominent retroperitoneal lymph nodes, which had significantly diminished in size compared to prior exam.  A hiatal hernia was seen. There was descending colonic  diverticulosis without evidence of acute diverticulitis.  Bilateral screening mammogram in May 2022 revealed possible asymmetry in the left breast.  Left diagnostic mammogram and ultrasound in June revealed a cluster of cysts/apocrine cyst in the left breast at 2 o'clock, 3 cm the nipple, measuring 6 x 3 x 6 mm, unchanged from the exam dated May 2021. There were no solid masses or suspicious lesions. She is up to date on colonoscopy with her last colonoscopy being in June 2022 with Dr. Lyda Jester.  This was negative, and repeat in 10 years was advised.    INTERVAL HISTORY:  Samantha Warren is here today for repeat clinical assessment prior to IVIG. She states that she is doing well. She was placed on an antibiotic because she had an infection/swelling on her right jaw (the mandibular area), she saw Dr.Lewis and Kelli last week. She states she had no fever or chills and this has improved with the antibiotics. We did check her immunoglobulin levels today which are pending. She was found to be anemic last week with a  hemoglobin dropping from 12.3 to 11.3, today it is stable at 11.4. I feel this is likely related to her infection, but if this persists we will recheck an anemia evaluation. Platelets were up top 50,000 but back down to 46,000, overall stable. Her white count is 6.7 but with 16% neutrophils for an ANC of 1.07. Lymphocytes are 72.7%. She continues to complain of chronic knee pain.  Her port was flushed today. She denies signs of infection such as sore throat, sinus drainage, cough, or urinary symptoms.  She denies fevers or recurrent chills. She denies pain. She denies nausea, vomiting, chest pain, dyspnea or cough. Her weight has remained stable.   REVIEW OF SYSTEMS:  Review of Systems  Constitutional: Negative.  Negative for appetite change, chills, diaphoresis, fatigue, fever and unexpected weight change.  HENT:  Negative.  Negative for hearing loss, lump/mass, mouth sores, nosebleeds, sore throat,  tinnitus, trouble swallowing and voice change.   Eyes: Negative.  Negative for eye problems and icterus.  Respiratory: Negative.  Negative for chest tightness, cough, hemoptysis, shortness of breath and wheezing.   Cardiovascular: Negative.  Negative for chest pain, leg swelling and palpitations.  Gastrointestinal: Negative.  Negative for abdominal distention, abdominal pain, blood in stool, constipation, diarrhea, nausea, rectal pain and vomiting.  Endocrine: Negative.  Negative for hot flashes.  Genitourinary: Negative.  Negative for bladder incontinence, difficulty urinating, dyspareunia, dysuria, frequency, hematuria, menstrual problem, nocturia, pelvic pain, vaginal bleeding and vaginal discharge.   Musculoskeletal:  Positive for arthralgias (in both knees). Negative for back pain, flank pain, gait problem, myalgias, neck pain and neck stiffness.  Skin: Negative.  Negative for itching, rash and wound.  Neurological:  Negative for dizziness, extremity weakness, gait problem, headaches, light-headedness, numbness, seizures and speech difficulty.  Hematological: Negative.  Negative for adenopathy. Does not bruise/bleed easily.  Psychiatric/Behavioral: Negative.  Negative for confusion, decreased concentration, depression, sleep disturbance and suicidal ideas. The  patient is not nervous/anxious.      VITALS:  Blood pressure 129/73, pulse 76, temperature 98.7 F (37.1 C), temperature source Oral, resp. rate 18, height 5' 1.5" (1.562 m), weight 223 lb 6.4 oz (101.3 kg), SpO2 99 %.  Wt Readings from Last 3 Encounters:  05/14/22 220 lb 12 oz (100.1 kg)  05/14/22 223 lb 6.4 oz (101.3 kg)  05/07/22 224 lb 1.6 oz (101.7 kg)    Body mass index is 41.53 kg/m.  Performance status (ECOG): 1 - Symptomatic but completely ambulatory  PHYSICAL EXAM:  Physical Exam Vitals and nursing note reviewed.  Constitutional:      General: She is not in acute distress.    Appearance: Normal appearance. She is  normal weight. She is not ill-appearing, toxic-appearing or diaphoretic.  HENT:     Head: Normocephalic and atraumatic.     Right Ear: Tympanic membrane, ear canal and external ear normal. There is no impacted cerumen.     Left Ear: Tympanic membrane and external ear normal. There is no impacted cerumen.     Nose: Nose normal. No congestion or rhinorrhea.     Mouth/Throat:     Mouth: Mucous membranes are moist.     Pharynx: Oropharynx is clear. No oropharyngeal exudate or posterior oropharyngeal erythema.  Eyes:     General: No scleral icterus.       Right eye: No discharge.        Left eye: No discharge.     Extraocular Movements: Extraocular movements intact.     Conjunctiva/sclera: Conjunctivae normal.     Pupils: Pupils are equal, round, and reactive to light.  Neck:     Vascular: No carotid bruit.     Comments: A swelling I the submandibular area measuring 2-3 cm, firm, non tender.  Cardiovascular:     Rate and Rhythm: Normal rate and regular rhythm.     Pulses: Normal pulses.     Heart sounds: Normal heart sounds. No murmur heard.    No friction rub. No gallop.  Pulmonary:     Effort: Pulmonary effort is normal. No respiratory distress.     Breath sounds: Normal breath sounds. No stridor. No wheezing, rhonchi or rales.  Chest:     Chest wall: No tenderness.  Abdominal:     General: Bowel sounds are normal. There is no distension.     Palpations: Abdomen is soft. There is no hepatomegaly, splenomegaly or mass.     Tenderness: There is no abdominal tenderness. There is no right CVA tenderness, left CVA tenderness, guarding or rebound.     Hernia: No hernia is present.  Musculoskeletal:        General: No swelling, tenderness, deformity or signs of injury. Normal range of motion.     Cervical back: Normal range of motion and neck supple. No rigidity or tenderness.     Right lower leg: No edema.     Left lower leg: No edema.  Lymphadenopathy:     Cervical: No cervical  adenopathy.     Right cervical: No superficial, deep or posterior cervical adenopathy.    Left cervical: No superficial, deep or posterior cervical adenopathy.     Upper Body:     Right upper body: No supraclavicular, axillary or pectoral adenopathy.     Left upper body: No supraclavicular, axillary or pectoral adenopathy.     Lower Body: No right inguinal adenopathy. No left inguinal adenopathy.  Skin:    General: Skin is warm and dry.  Coloration: Skin is not jaundiced or pale.     Findings: No bruising, erythema, lesion or rash.  Neurological:     General: No focal deficit present.     Mental Status: She is alert and oriented to person, place, and time. Mental status is at baseline.     Cranial Nerves: No cranial nerve deficit.     Sensory: No sensory deficit.     Motor: No weakness.     Coordination: Coordination normal.     Gait: Gait normal.     Deep Tendon Reflexes: Reflexes normal.  Psychiatric:        Mood and Affect: Mood normal.        Behavior: Behavior normal.        Thought Content: Thought content normal.        Judgment: Judgment normal.    LABS:      Latest Ref Rng & Units 05/14/2022   12:00 AM 05/07/2022   12:00 AM 04/15/2022   12:00 AM  CBC  WBC  6.7     6.5     6.2      Hemoglobin 12.0 - 16.0 11.4     11.3     12.3      Hematocrit 36 - 46 35     35     36      Platelets 150 - 400 K/uL 46     50     44         This result is from an external source.      Latest Ref Rng & Units 05/14/2022   12:00 AM 05/07/2022   12:00 AM 04/15/2022   12:00 AM  CMP  BUN 4 - '21 21     17     16      '$ Creatinine 0.5 - 1.1 0.8     0.8     0.8      Sodium 137 - 147 141     141     140      Potassium 3.5 - 5.1 mEq/L 4.1     3.8     3.8      Chloride 99 - 108 110     110     107      CO2 13 - '22 22     21     25      '$ Calcium 8.7 - 10.7 8.7     8.5     8.7      Alkaline Phos 25 - 125 64     78     71      AST 13 - 35 30     38     32      ALT 7 - 35 U/L '18     18     23          '$ This result is from an external source.     No results found for: "CEA1", "CEA" / No results found for: "CEA1", "CEA" No results found for: "PSA1" No results found for: "[id number]" No results found for: "CAN125"  No results found for: "TOTALPROTELP", "ALBUMINELP", "A1GS", "A2GS", "BETS", "BETA2SER", "GAMS", "MSPIKE", "SPEI" No results found for: "TIBC", "FERRITIN", "IRONPCTSAT" No results found for: "LDH"  STUDIES:  No results found.   /// EXAM: 07/12/21 CT MAXILLOFACIAL WITHOUT CONTRAST  TECHNIQUE: Multidetector CT imaging of the maxillofacial structures was performed. Multiplanar CT image reconstructions were also generated.  RADIATION  DOSE REDUCTION: This exam was performed according to the departmental dose-optimization program which includes automated exposure control, adjustment of the mA and/or kV according to patient size and/or use of iterative reconstruction technique.  COMPARISON: None.  FINDINGS: Streak artifact from jewelry patient declined to remove. Streak artifact from dental amalgam.  Osseous: No acute abnormality identified.  Orbits: Unremarkable.  Sinuses: Mild mucosal thickening.  Soft tissues: Suspect asymmetric tissue thickening the anterior aspect of the right mandible. No abscess identified.  Limited intracranial: No acute abnormality.  IMPRESSION: Evaluation limited by significant streak artifact. Suspect inflammatory changes along the anterior right mandible. No abscess.   HISTORY:   Past Medical History:  Diagnosis Date   Cancer [facility])     Past Surgical History:  Procedure Laterality Date   arm surgery     GALLBLADDER SURGERY     TOTAL KNEE ARTHROPLASTY      Family History  Problem Relation Age of Onset   Cancer Mother    Hypertension Father    Hypertension Sister    Stroke Sister    Hypertension Brother    Stroke Brother     Social History:  reports that she has been smoking cigarettes. She has a 15.00 pack-year  smoking history. She has never used smokeless tobacco. She reports current drug use. No history on file for alcohol use.The patient is alone today.  Allergies:  Allergies  Allergen Reactions   Metronidazole     Other reaction(s): Other (See Comments)   Rituximab     Other reaction(s): Other (See Comments)   Sulfa Antibiotics Rash    Current Medications: Current Outpatient Medications  Medication Sig Dispense Refill   alendronate (FOSAMAX) 70 MG tablet      amLODipine (NORVASC) 2.5 MG tablet Take 2.5 mg by mouth daily.     Ascorbic Acid (VITAMIN C WITH ROSE HIPS) 500 MG tablet Take 500 mg by mouth daily.     benzonatate (TESSALON) 100 MG capsule TAKE ONE CAPSULE BY MOUTH 3 TIMES A DAY AS NEEDED 60 capsule 5   calcium carbonate (OSCAL) 1500 (600 Ca) MG TABS tablet Take by mouth.     Cholecalciferol (VITAMIN D3) 125 MCG (5000 UT) TABS Take 1 tablet by mouth daily.     clindamycin (CLEOCIN) 300 MG capsule Take 1 capsule (300 mg total) by mouth 3 (three) times daily. 21 capsule 0   diclofenac Sodium (VOLTAREN) 1 % GEL Apply topically.     diphenoxylate-atropine (LOMOTIL) 2.5-0.025 MG tablet Take 1-2 tablets by mouth every 6 (six) hours.     ibuprofen (ADVIL) 800 MG tablet      KLOR-CON M20 20 MEQ tablet TAKE 1 TABLET BY MOUTH 3 TIMES DAILY. (Patient taking differently: Take 20 mEq by mouth 3 (three) times daily. Patient voiced currently taking once daily provider aware) 270 tablet 1   lidocaine (LIDODERM) 5 % 2 patches daily.     lidocaine-prilocaine (EMLA) cream Apply 1 Application topically as needed. 30 g 5   Multiple Vitamin (MULTIVITAMIN WITH MINERALS) TABS tablet Take 1 tablet by mouth daily.     Omega-3 Fatty Acids (FISH OIL) 1000 MG CAPS Take 1 capsule by mouth daily.     omeprazole (PRILOSEC) 20 MG capsule TAKE 1 CAPSULE BY MOUTH EVERY DAY 90 capsule 3   oxyCODONE (ROXICODONE) 15 MG immediate release tablet Take 15 mg by mouth every 6 (six) hours as needed.     prochlorperazine  (COMPAZINE) 10 MG tablet TAKE 1 TABLET BY MOUTH EVERY 6 HOURS AS  NEEDED 90 tablet 1   VENCLEXTA 100 MG tablet Take 4 tablets (400 mg total) by mouth daily. 30 tablet 0   vitamin B-12 (CYANOCOBALAMIN) 100 MCG tablet Take 100 mcg by mouth daily.     Current Facility-Administered Medications  Medication Dose Route Frequency Provider Last Rate Last Admin   sodium chloride flush (NS) 0.9 % injection 10 mL  10 mL Intracatheter PRN Derwood Kaplan, MD   10 mL at 05/14/22 0902   Facility-Administered Medications Ordered in Other Visits  Medication Dose Route Frequency Provider Last Rate Last Admin   influenza vac split quadrivalent PF (FLUARIX) injection 0.5 mL  0.5 mL Intramuscular Once Derwood Kaplan, MD       sodium chloride flush (NS) 0.9 % injection 10 mL  10 mL Intracatheter PRN Rosanne Sack A, PA-C   10 mL at 09/26/20 [id number]        I,Gabriella Ballesteros,acting as a scribe for Derwood Kaplan, MD.,have documented all relevant documentation on the behalf of Derwood Kaplan, MD,as directed by  Derwood Kaplan, MD while in the presence of Derwood Kaplan, MD.

## 2022-05-13 ENCOUNTER — Encounter: Payer: Self-pay | Admitting: Oncology

## 2022-05-13 MED FILL — Immune Globulin (Human) IV Soln 10 GM/100ML: INTRAVENOUS | Qty: 450 | Status: AC

## 2022-05-13 NOTE — Addendum Note (Signed)
Addended by: Juanetta Beets on: 05/13/2022 10:07 AM   Modules accepted: Orders

## 2022-05-14 ENCOUNTER — Inpatient Hospital Stay (INDEPENDENT_AMBULATORY_CARE_PROVIDER_SITE_OTHER): Payer: Medicare Other | Admitting: Oncology

## 2022-05-14 ENCOUNTER — Inpatient Hospital Stay: Payer: Medicare Other

## 2022-05-14 ENCOUNTER — Encounter: Payer: Self-pay | Admitting: Oncology

## 2022-05-14 VITALS — BP 129/73 | HR 76 | Temp 98.7°F | Resp 18 | Ht 61.5 in | Wt 223.4 lb

## 2022-05-14 VITALS — BP 99/57 | HR 63 | Temp 98.0°F | Resp 18 | Ht 61.5 in | Wt 220.8 lb

## 2022-05-14 DIAGNOSIS — C9110 Chronic lymphocytic leukemia of B-cell type not having achieved remission: Secondary | ICD-10-CM

## 2022-05-14 DIAGNOSIS — D801 Nonfamilial hypogammaglobulinemia: Secondary | ICD-10-CM | POA: Diagnosis not present

## 2022-05-14 DIAGNOSIS — D819 Combined immunodeficiency, unspecified: Secondary | ICD-10-CM

## 2022-05-14 LAB — CBC AND DIFFERENTIAL
HCT: 35 — AB (ref 36–46)
Hemoglobin: 11.4 — AB (ref 12.0–16.0)
Neutrophils Absolute: 1.01
Platelets: 46 10*3/uL — AB (ref 150–400)
WBC: 6.7

## 2022-05-14 LAB — BASIC METABOLIC PANEL WITH GFR
BUN: 21 (ref 4–21)
CO2: 22 (ref 13–22)
Chloride: 110 — AB (ref 99–108)
Creatinine: 0.8 (ref 0.5–1.1)
Glucose: 90
Potassium: 4.1 meq/L (ref 3.5–5.1)
Sodium: 141 (ref 137–147)

## 2022-05-14 LAB — COMPREHENSIVE METABOLIC PANEL
Albumin: 4.2 (ref 3.5–5.0)
Calcium: 8.7 (ref 8.7–10.7)

## 2022-05-14 LAB — HEPATIC FUNCTION PANEL
ALT: 18 U/L (ref 7–35)
AST: 30 (ref 13–35)
Alkaline Phosphatase: 64 (ref 25–125)
Bilirubin, Total: 0.6

## 2022-05-14 LAB — CBC: RBC: 3.74 — AB (ref 3.87–5.11)

## 2022-05-14 MED ORDER — DEXTROSE 5 % IV SOLN: Freq: Once | INTRAVENOUS | Status: AC

## 2022-05-14 MED ORDER — ACETAMINOPHEN 325 MG PO TABS
650.0000 mg | ORAL_TABLET | Freq: Once | ORAL | Status: AC
  Administered 2022-05-14: 650 mg via ORAL
  Filled 2022-05-14: qty 2

## 2022-05-14 MED ORDER — DIPHENHYDRAMINE HCL 50 MG/ML IJ SOLN
25.0000 mg | Freq: Once | INTRAMUSCULAR | Status: AC
  Administered 2022-05-14: 25 mg via INTRAVENOUS
  Filled 2022-05-14: qty 1

## 2022-05-14 MED ORDER — HEPARIN SOD (PORK) LOCK FLUSH 100 UNIT/ML IV SOLN
500.0000 [IU] | Freq: Once | INTRAVENOUS | Status: AC | PRN
  Administered 2022-05-14: 500 [IU]

## 2022-05-14 MED ORDER — IMMUNE GLOBULIN (HUMAN) 10 GM/100ML IV SOLN
0.9000 g/kg | Freq: Once | INTRAVENOUS | Status: AC
  Administered 2022-05-14: 45 g via INTRAVENOUS
  Filled 2022-05-14: qty 450

## 2022-05-14 MED ORDER — SODIUM CHLORIDE 0.9% FLUSH
10.0000 mL | INTRAVENOUS | Status: DC | PRN
  Administered 2022-05-14: 10 mL

## 2022-05-14 NOTE — Patient Instructions (Signed)

## 2022-05-15 LAB — IGG, IGA, IGM
IgA: 23 mg/dL — ABNORMAL LOW (ref 64–422)
IgG (Immunoglobin G), Serum: 808 mg/dL (ref 586–1602)
IgM (Immunoglobulin M), Srm: 31 mg/dL (ref 26–217)

## 2022-05-27 ENCOUNTER — Encounter: Payer: Self-pay | Admitting: Oncology

## 2022-06-10 MED FILL — Immune Globulin (Human) IV Soln 10 GM/100ML: INTRAVENOUS | Qty: 450 | Status: AC

## 2022-06-11 ENCOUNTER — Encounter: Payer: Self-pay | Admitting: Oncology

## 2022-06-11 ENCOUNTER — Inpatient Hospital Stay: Payer: Medicare Other | Attending: Hematology and Oncology | Admitting: Oncology

## 2022-06-11 ENCOUNTER — Inpatient Hospital Stay: Payer: Medicare Other

## 2022-06-11 VITALS — BP 132/70 | HR 71 | Temp 98.0°F | Resp 18 | Ht 61.5 in | Wt 223.5 lb

## 2022-06-11 VITALS — BP 147/61 | HR 71 | Temp 97.9°F | Resp 18 | Ht 61.5 in | Wt 223.0 lb

## 2022-06-11 DIAGNOSIS — D801 Nonfamilial hypogammaglobulinemia: Secondary | ICD-10-CM | POA: Diagnosis not present

## 2022-06-11 DIAGNOSIS — D819 Combined immunodeficiency, unspecified: Secondary | ICD-10-CM | POA: Diagnosis not present

## 2022-06-11 DIAGNOSIS — C9110 Chronic lymphocytic leukemia of B-cell type not having achieved remission: Secondary | ICD-10-CM

## 2022-06-11 LAB — HEPATIC FUNCTION PANEL
ALT: 19 U/L (ref 7–35)
AST: 29 (ref 13–35)
Alkaline Phosphatase: 71 (ref 25–125)
Bilirubin, Total: 0.6

## 2022-06-11 LAB — BASIC METABOLIC PANEL
BUN: 24 — AB (ref 4–21)
CO2: 25 — AB (ref 13–22)
Chloride: 109 — AB (ref 99–108)
Creatinine: 0.9 (ref 0.5–1.1)
Glucose: 95
Potassium: 4.5 mEq/L (ref 3.5–5.1)
Sodium: 140 (ref 137–147)

## 2022-06-11 LAB — CBC AND DIFFERENTIAL
HCT: 34 — AB (ref 36–46)
Hemoglobin: 11.5 — AB (ref 12.0–16.0)
Neutrophils Absolute: 1.43
Platelets: 44 10*3/uL — AB (ref 150–400)
WBC: 8.4

## 2022-06-11 LAB — COMPREHENSIVE METABOLIC PANEL
Albumin: 4.2 (ref 3.5–5.0)
Calcium: 8.8 (ref 8.7–10.7)

## 2022-06-11 LAB — CBC: RBC: 3.73 — AB (ref 3.87–5.11)

## 2022-06-11 MED ORDER — DIPHENHYDRAMINE HCL 50 MG/ML IJ SOLN
25.0000 mg | Freq: Once | INTRAMUSCULAR | Status: AC
  Administered 2022-06-11: 25 mg via INTRAVENOUS
  Filled 2022-06-11: qty 1

## 2022-06-11 MED ORDER — HEPARIN SOD (PORK) LOCK FLUSH 100 UNIT/ML IV SOLN
500.0000 [IU] | Freq: Once | INTRAVENOUS | Status: AC | PRN
  Administered 2022-06-11: 500 [IU]

## 2022-06-11 MED ORDER — DEXTROSE 5 % IV SOLN: Freq: Once | INTRAVENOUS | Status: AC

## 2022-06-11 MED ORDER — SODIUM CHLORIDE 0.9% FLUSH
10.0000 mL | Freq: Once | INTRAVENOUS | Status: AC | PRN
  Administered 2022-06-11: 10 mL

## 2022-06-11 MED ORDER — SODIUM CHLORIDE 0.9% FLUSH
10.0000 mL | INTRAVENOUS | Status: DC | PRN
  Administered 2022-06-11 (×2): 10 mL

## 2022-06-11 MED ORDER — IMMUNE GLOBULIN (HUMAN) 10 GM/100ML IV SOLN
0.9000 g/kg | Freq: Once | INTRAVENOUS | Status: AC
  Administered 2022-06-11: 45 g via INTRAVENOUS
  Filled 2022-06-11: qty 450

## 2022-06-11 MED ORDER — ACETAMINOPHEN 325 MG PO TABS
650.0000 mg | ORAL_TABLET | Freq: Once | ORAL | Status: AC
  Administered 2022-06-11: 650 mg via ORAL
  Filled 2022-06-11: qty 2

## 2022-06-11 NOTE — Patient Instructions (Signed)
Immune Globulin Injection Quel est ce mdicament ? [place] aide  prvenir ou  limiter la svrit de certaines infections chez les patients  risque. Ce mdicament est fabriqu  partir de mlanges de dons de sang provenant de diffrents donneurs. Il sert  traiter des troubles du systme immunitaire, dont la thrombocytopnie et le syndrome de Kawasaki. Ce mdicament peut tre utilis pour d'autres indications ; adressez-vous  votre mdecin ou  votre pharmacien si vous [employer] questions. NOM(S) DE MARQUE COURANT(S) : ASCENIV, Baygam, BIVIGAM, Carimune, Carimune NF, cutaquig, Cuvitru, Flebogamma, Flebogamma DIF, GamaSTAN, GamaSTAN S/D, Gamimune N, Gammagard, Gammagard S/D, Gammaked, Gammaplex, Gammar-P IV, Gamunex, Gamunex-C, Hizentra, Iveegam, Iveegam EN, Octagam, Panglobulin, Panglobulin NF, panzyga, Polygam S/D, Privigen, Sandoglobulin, Venoglobulin-S, Vigam, Vivaglobulin, Xembify Que dois-je dire  mon fournisseur de [employer] de prendre ce mdicament ? Ils ont besoin de savoir si vous prsentez l'une quelconque des affections ou situations suivantes : diabte taux extrmement faible ou absence d'anticorps immuns dans le sang maladie cardiaque antcdent de formation de caillots sanguins hyperprolinmie infection du sang, sepsie maladie rnale vous avez rcemment t vaccin(e) ou vous devez prochainement tre vaccin(e) raction inhabituelle ou allergique  l'immunoglobuline humaine,  l'albumine, au maltose ou au sucrose raction inhabituelle ou allergique  d'autres mdicaments, aliments, colorants ou conservateurs vous tes enceinte ou cherchez  tre enceinte vous allaitez Comment dois-je utiliser ce mdicament ? Ce mdicament est destin  tre inject dans un muscle ou perfus dans une veine ou sous la peau. Ce mdicament est gnralement administr par un professionnel de sant dans un hpital ou [employer]. Dans de rares cas, ce mdicament peut tre administr   domicile. On vous expliquera comment utiliser ce mdicament. Utilisez-le exactement [employer] instructions. Prenez ce mdicament  intervalles rguliers. Ne prenez pas ce mdicament plus souvent que ce qui vous a t prescrit. Interrogez votre pdiatre quant  l'utilisation de ce mdicament [employer]. Bien que ce mdicament puisse tre prescrit pour certaines affections, des prcautions s'imposent. Surdosage : si vous pensez avoir pris ce mdicament en excs, contactez immdiatement un centre [employer]. REMARQUE : ce mdicament vous est uniquement destin. Ne partagez pas ce Location manager. Que faire si je saute une dose ? Il est important de ne pas sauter de dose. Contactez votre mdecin ou votre fournisseur de soins si vous ne pouvez vous rendre  Engineer, drilling. Si vous vous administrez vous-mme le mdicament et si vous avez saut une dose, prenez-la ds que possible. S'il est presque l'heure de votre prochaine dose, ne prenez que cette dose-l. Ne prenez ni doubles doses, ni doses supplmentaires. Quelles sont les interactions possibles avec ce mdicament ? aspirine et mdicaments analogues cisplatine cyclosporine certains mdicaments contre l'infection, tels que l'acyclovir, l'adfovir, l'amphotricine B, la bacitracine, le cidofovir, le foscarnet, le ganciclovir, la gentamicine, la pentamidine et la vancomycine AINS, mdicaments contre la douleur et l'inflammation, tels que l'ibuprofne et le naproxne pamidronate vaccins acide zoldronique Cette liste peut ne pas dcrire toutes les interactions mdicamenteuses possibles. Donnez  votre fournisseur de [employer] de tous les mdicaments, plantes mdicinales, mdicaments en vente libre ou complments alimentaires que vous prenez. Dites-lui aussi si vous fumez, buvez de l'alcool ou consommez des drogues illicites. Certaines substances peuvent interagir Physiological scientist. Que dois-je  Museum/gallery exhibitions officer par ce mdicament ? Vous serez troitement surveill(e) Radio broadcast assistant par [employer]. Ce mdicament est fabriqu  partir de mlanges de dons de sang provenant de diffrents donneurs. Mme sous traitement  par ce mdicament, vous pouvez transmettre une infection. Cependant, les donneurs sont soumis  un dpistage des infections et tous les [employer] tests [employer] VIH et le virus de l'hpatite. Ce mdicament est destin  dtruire la majorit ou la totalit des bactries et virus. Discutez avec votre mdecin des risques et bnfices de ce mdicament. Ne vous faites pas vacciner pendant au moins 14 jours avant et au moins 3 mois aprs le traitement par ce [employer]. Quels effets secondaires puis-je remarquer en prenant ce mdicament ? Effets secondaires que vous devez signaler  votre mdecin ou  votre fournisseur de soins ds que possible : ractions allergiques telles qu'une ruption cutane, des dmangeaisons, de Manufacturing engineer, un gonflement du visage, des lvres ou de la langue coloration bleue des lvres ou de la peau difficults pour respirer douleur ou oppression dans la poitrine fivre signes et symptmes d'une mningite aseptique, tels que : raideur de la nuque, sensibilit  la lumire, mal de tte, somnolence, fivre, nause, vomissements, ruption cutane signes et symptmes de caillot sanguin, tels que : douleurs  la poitrine, essoufflement, jambe douloureuse, enfle ou chaude signes et symptmes [employer], tels que : battements de cour rapides, fatigue, coloration jaune fonc ou marron de l'urine ou jaunissement des yeux ou de la peau signes et symptmes de lsion rnale, tels que : difficults  uriner ou modifications de la quantit d'urine mise prise de poids subite gonflement des [place], des pieds ou des mains Effets secondaires ne ncessitant pas habituellement de consultation mdicale (signalez-les  votre mdecin  ou  votre fournisseur de soins s'ils persistent ou sont gnants) : diarrhe bouffes congestives mal de tte augmentation de la transpiration maux ou douleurs articulaires spasmes ou crampes musculaires maux ou douleurs musculaires nauses douleur, rougeur, dmangeaisons ou irritation au site d'injection sensation de fatigue Cette liste peut ne pas dcrire tous les effets secondaires possibles. Appelez votre mdecin pour [employer] sujet des [employer]. Vous pouvez signaler les effets secondaires  la FDA, au [phone]. O dois-je conserver mon mdicament ? Conserver hors de Insurance underwriter. Ce mdicament est gnralement administr en milieu hospitalier ou clinique et ne sera pas conserv au domicile du patient. Dans de rares cas, ce mdicament peut tre administr  domicile. Si vous prenez ce mdicament chez vous, vous recevrez des [place] sur son mode de conservation. Jeter tout mdicament inutilis aprs la date de premption figurant sur l'tiquette ou sur Risk manager. REMARQUE : cette notice est un rsum. Elle peut ne pas contenir toutes les informations possibles. Si vous avez des questions  propos de ce mdicament, Secretary/administrator mdecin, votre pharmacien ou votre fournisseur de [place].  2023 Elsevier/Gold Standard (2019-09-21 00:00:00)

## 2022-06-11 NOTE — Progress Notes (Signed)
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  She did not require treatment until 2008 and has since been through multiple therapies.  She is currently taking venetoclax 400 mg daily with good control of her disease for over 5 years.    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  She wanted to space out her treatments and we will plan to change to every 2-3 months after the Samantha season.   Thrombocytopenia Her platelet count has slowly declined over the last few months and was the lowest today at 46,000. I am concerned this may be an indicator of progressive disease, but I find no splenomegaly or adenopathy or other signs of her CLL. We will continue to monitor this closely on a monthly basis. I have cautioned her about the increased risk for bruising or bleeding.   Plan: She will proceed with IVIG today.  We will plan to see her back in 4 weeks with a CBC and comprehensive metabolic panel. We will consider going to every 8 weeks. However, we may need to keep checking her labs monthly, depending on results. The patient understands the plans discussed today and is in agreement with them.  She knows to contact our office if she develops concerns prior to her next appointment.   I provided 20 minutes of face-to-face time during this encounter and > 50% was spent counseling as documented under my assessment and plan.    [name], MD  [facility] [address] [place] [address] Dept: [phone] Dept Fax: [phone]   No orders of the defined types were  placed in this encounter.     CHIEF COMPLAINT:  CC: CLL with combined immunodeficiency  Current Treatment: Venetoclax/IVIG  HISTORY OF PRESENT ILLNESS:  Samantha Warren is a 77 y.o. 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  She was on observation until November 2008. She was initially treated with chlorambucil and prednisone, as she refused intravenous chemotherapy.  She had a partial response to this regimen.  By January 2010, she had progressive disease with a white count over 200,000, with associated anemia, splenomegaly, thrombocytopenia, and adenopathy.  She then received 5 cycles of fludarabine with good response.  She did well until January 2012, when she had progression once again.  She had a single dose of bendamustine and rituximab, which kept her disease under control for over a year.  However, she had a severe allergic reaction to the rituximab, so has not received further rituximab.  She was hospitalized after the bendamustine because of severe pancytopenia and required multiple transfusions, so was placed on observation.  In March 2013, she had progression of disease again, so was treated with bendamustine for 6 cycles, at a 50% dose reduction, and once again had a excellent response.  She was on observation until August 2014, then had progression of disease, so was placed on ibrutinib 420 mg daily.  The ibrutinib had kept her disease under fairly good control, but then she develops severe bilateral lower extremity cellulitis in April 2016, so ibrutinib was placed on hold.  She had persistent cellulitis,  as well as  Clostridium difficile colitis in May 2016 requiring hospitalization.  She was readmitted soon after discharge with worsening cellulitis, requiring a prolonged hospitalization.  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 She eventually had resolution of  the cellulitis   As she was largely asymptomatic, we continued observation until February 2017, at which time she had worsening splenomegaly.  Repeat CT imaging in February showed progressive disease, with marked splenomegaly and bulky lymphadenopathy.  She was then placed back on ibrutinib 420mg  daily.  The lymphocytosis, anemia and thrombocytopenia slowly improved.  Due to neutropenia, ibrutinib was placed on hold in December 2017.  The ibrutinib was resumed in January 2018, but discontinued in August 2018 due to toxicities, mainly severe abdominal muscle cramping.  She was hospitalized in early September 2018 with urinary tract infection and pancytopenia.  She received multiple red blood cell and platelet transfusions during her stay.  She was admitted again in late September 2018 with perineal cellulitis extending to the lower abdomen and upper thigh, as well as persistent pancytopenia.  CT abdomen and pelvis revealed interval decrease in the abdominal lymphadenopathy, chronic massive splenomegaly with scattered small splenic infarcts, and small left pleural effusion with bibasilar atelectasis.  She required packed red blood cells while hospitalized.  She was discharged home with home health and [facility]  We continued to follow her closely, but did not place her on a treatment due to the persistent cellulitis.  She then was admitted in October 2018 with severe hypercalcemia, with a calcium of 15.  She was treated with IV fluids, zoledronic acid and Calcitonin injections.  We continued to monitor her closely.  She had recurrent hypercalcemia, for which she received IV fluids and zolendronic acid as an outpatient.     Due to worsening pancytopenia, she underwent bone marrow biopsy in January 2019.   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  The cellulitis had improved to a point we felt she could be treated  again, so she was placed on venetoclax CLL in January 2019.  She has continued on venetoclax 400 mg daily after ramp up dosing and has tolerated that fairly well, except for mild diarrhea.  Since starting venetoclax, she has had decreasing splenomegaly and improvement in her pancytopenia.  She initially continued to require IV fluids and zoledronic acid for the hypercalcemia.  Her last dose of zoledronic acid was given in March 2019.  In March, she had an irregular heart rhythm and EKG revealed occasional premature atrial complexes, as well as an incomplete right bundle branch block.  This was stable compared to EKG done in October 2018.  Quantitative immunoglobulins done in August 2019 revealed a normal IgG, with low IgA and IgM.  She has continued IVIG monthly.  She had an episode of Campylobacter diarrhea in November 2019, which was treated with azithromycin for 7 days with resolution of her diarrhea. Venetoclax was held temporarily until her diarrhea resolved.  She has since tolerated venetoclax without significant difficulty.     In February 2022, she had a right diagnostic mammogram due to right breast subareolar tenderness, which did not reveal any evidence of malignancy.  CT abdomen in April did not reveal any acute abnormality or other findings to explain the left-sided abdominal pain.  There was resolution of previously seen gross splenomegaly.  There were prominent retroperitoneal lymph nodes,  which had significantly diminished in size compared to prior exam.  A hiatal hernia was seen. There was descending colonic diverticulosis without evidence of acute diverticulitis.  Bilateral screening mammogram in May 2022 revealed possible asymmetry in the left breast.  Left diagnostic mammogram and ultrasound in June revealed a cluster of cysts/apocrine cyst in the left breast at 2 o'clock, 3 cm the nipple, measuring 6 x 3 x 6 mm, unchanged from the exam dated May 2021. There were no solid masses or suspicious  lesions. She is up to date on colonoscopy with her last colonoscopy being in June 2022 with Dr. Jennye Boroughs.  This was negative, and repeat in 10 years was advised.    INTERVAL HISTORY:  Samantha Warren is here today for repeat clinical assessment prior to IVIG. Hemoglobin is 11.5, Platelets are 44,000 today, 46,000 mid February and 50,000 in early February. She continues to complain of chronic knee pain.  She notes that she is taking OmegaXL a joint and muscle support for her knee inflammation. She's taking this twice a day.  She denies signs of infection such as sore throat, sinus drainage, cough, or urinary symptoms.  She denies fevers or recurrent chills. She denies pain. She denies nausea, vomiting, chest pain, dyspnea or cough. Her weight has remained stable.   REVIEW OF SYSTEMS:  Review of Systems  Constitutional: Negative.  Negative for appetite change, chills, diaphoresis, fatigue, fever and unexpected weight change.  HENT:  Negative.  Negative for hearing loss, lump/mass, mouth sores, nosebleeds, sore throat, tinnitus, trouble swallowing and voice change.   Eyes: Negative.  Negative for eye problems and icterus.  Respiratory: Negative.  Negative for chest tightness, cough, hemoptysis, shortness of breath and wheezing.   Cardiovascular: Negative.  Negative for chest pain, leg swelling and palpitations.  Gastrointestinal: Negative.  Negative for abdominal distention, abdominal pain, blood in stool, constipation, diarrhea, nausea, rectal pain and vomiting.  Endocrine: Negative.  Negative for hot flashes.  Genitourinary: Negative.  Negative for bladder incontinence, difficulty urinating, dyspareunia, dysuria, frequency, hematuria, menstrual problem, nocturia, pelvic pain, vaginal bleeding and vaginal discharge.   Musculoskeletal:  Positive for arthralgias (in both knees). Negative for back pain, flank pain, gait problem, myalgias, neck pain and neck stiffness.  Skin: Negative.  Negative for itching, rash  and wound.  Neurological:  Negative for dizziness, extremity weakness, gait problem, headaches, light-headedness, numbness, seizures and speech difficulty.  Hematological: Negative.  Negative for adenopathy. Does not bruise/bleed easily.  Psychiatric/Behavioral: Negative.  Negative for confusion, decreased concentration, depression, sleep disturbance and suicidal ideas. The patient is not nervous/anxious.      VITALS:  Blood pressure 132/70, pulse 71, temperature 98 F (36.7 C), temperature source Oral, resp. rate 18, height 5' 1.5" (1.562 m), weight 223 lb 8 oz (101.4 kg), SpO2 100 %.  Wt Readings from Last 3 Encounters:  06/11/22 223 lb (101.2 kg)  06/11/22 223 lb 8 oz (101.4 kg)  05/14/22 220 lb 12 oz (100.1 kg)    Body mass index is 41.55 kg/m.  Performance status (ECOG): 1 - Symptomatic but completely ambulatory  PHYSICAL EXAM:  Physical Exam Vitals and nursing note reviewed.  Constitutional:      General: She is not in acute distress.    Appearance: Normal appearance. She is normal weight. She is not ill-appearing, toxic-appearing or diaphoretic.  HENT:     Head: Normocephalic and atraumatic.     Right Ear: Tympanic membrane, ear canal and external ear normal. There is no impacted cerumen.  Left Ear: Tympanic membrane and external ear normal. There is no impacted cerumen.     Nose: Nose normal. No congestion or rhinorrhea.     Mouth/Throat:     Mouth: Mucous membranes are moist.     Pharynx: Oropharynx is clear. No oropharyngeal exudate or posterior oropharyngeal erythema.  Eyes:     General: No scleral icterus.       Right eye: No discharge.        Left eye: No discharge.     Extraocular Movements: Extraocular movements intact.     Conjunctiva/sclera: Conjunctivae normal.     Pupils: Pupils are equal, round, and reactive to light.  Neck:     Vascular: No carotid bruit.     Comments: A swelling I the submandibular area measuring 2-3 cm, firm, non tender.   Cardiovascular:     Rate and Rhythm: Normal rate and regular rhythm.     Pulses: Normal pulses.     Heart sounds: Normal heart sounds. No murmur heard.    No friction rub. No gallop.  Pulmonary:     Effort: Pulmonary effort is normal. No respiratory distress.     Breath sounds: Normal breath sounds. No stridor. No wheezing, rhonchi or rales.  Chest:     Chest wall: No tenderness.  Abdominal:     General: Bowel sounds are normal. There is no distension.     Palpations: Abdomen is soft. There is no hepatomegaly, splenomegaly or mass.     Tenderness: There is no abdominal tenderness. There is no right CVA tenderness, left CVA tenderness, guarding or rebound.     Hernia: No hernia is present.  Musculoskeletal:        General: No swelling, tenderness, deformity or signs of injury. Normal range of motion.     Cervical back: Normal range of motion and neck supple. No rigidity or tenderness.     Right lower leg: No edema.     Left lower leg: No edema.  Lymphadenopathy:     Cervical: No cervical adenopathy.     Right cervical: No superficial, deep or posterior cervical adenopathy.    Left cervical: No superficial, deep or posterior cervical adenopathy.     Upper Body:     Right upper body: No supraclavicular, axillary or pectoral adenopathy.     Left upper body: No supraclavicular, axillary or pectoral adenopathy.     Lower Body: No right inguinal adenopathy. No left inguinal adenopathy.  Skin:    General: Skin is warm and dry.     Coloration: Skin is not jaundiced or pale.     Findings: No bruising, erythema, lesion or rash.  Neurological:     General: No focal deficit present.     Mental Status: She is alert and oriented to person, place, and time. Mental status is at baseline.     Cranial Nerves: No cranial nerve deficit.     Sensory: No sensory deficit.     Motor: No weakness.     Coordination: Coordination normal.     Gait: Gait normal.     Deep Tendon Reflexes: Reflexes normal.   Psychiatric:        Mood and Affect: Mood normal.        Behavior: Behavior normal.        Thought Content: Thought content normal.        Judgment: Judgment normal.    LABS:      Latest Ref Rng & Units 06/11/2022   12:00 AM  05/14/2022   12:00 AM 05/07/2022   12:00 AM  CBC  WBC  8.4     6.7     6.5      Hemoglobin 12.0 - 16.0 11.5     11.4     11.3      Hematocrit 36 - 46 34     35     35      Platelets 150 - 400 K/uL 44     46     50         This result is from an external source.      Latest Ref Rng & Units 06/11/2022   12:00 AM 05/14/2022   12:00 AM 05/07/2022   12:00 AM  CMP  BUN 4 - 21 24     21     17       Creatinine 0.5 - 1.1 0.9     0.8     0.8      Sodium 137 - 147 140     141     141      Potassium 3.5 - 5.1 mEq/L 4.5     4.1     3.8      Chloride 99 - 108 109     110     110      CO2 13 - 22 25     22     21       Calcium 8.7 - 10.7 8.8     8.7     8.5      Alkaline Phos 25 - 125 71     64     78      AST 13 - 35 29     30     38      ALT 7 - 35 U/L 19     18     18          This result is from an external source.     No results found for: "CEA1", "CEA" / No results found for: "CEA1", "CEA" No results found for: "PSA1" No results found for: "ZOX096" No results found for: "CAN125"  No results found for: "TOTALPROTELP", "ALBUMINELP", "A1GS", "A2GS", "BETS", "BETA2SER", "GAMS", "MSPIKE", "SPEI" No results found for: "TIBC", "FERRITIN", "IRONPCTSAT" No results found for: "LDH"  STUDIES:  No results found.   /// EXAM: 07/12/21 CT MAXILLOFACIAL WITHOUT CONTRAST  TECHNIQUE: Multidetector CT imaging of the maxillofacial structures was performed. Multiplanar CT image reconstructions were also generated.  RADIATION DOSE REDUCTION: This exam was performed according to the departmental dose-optimization program which includes automated exposure control, adjustment of the mA and/or kV according to patient size and/or use of iterative reconstruction  technique.  COMPARISON: None.  FINDINGS: Streak artifact from jewelry patient declined to remove. Streak artifact from dental amalgam.  Osseous: No acute abnormality identified.  Orbits: Unremarkable.  Sinuses: Mild mucosal thickening.  Soft tissues: Suspect asymmetric tissue thickening the anterior aspect of the right mandible. No abscess identified.  Limited intracranial: No acute abnormality.  IMPRESSION: Evaluation limited by significant streak artifact. Suspect inflammatory changes along the anterior right mandible. No abscess.   HISTORY:   Past Medical History:  Diagnosis Date   Cancer [facility])     Past Surgical History:  Procedure Laterality Date   arm surgery     GALLBLADDER SURGERY     TOTAL KNEE ARTHROPLASTY      Family History  Problem Relation Age of Onset  Cancer Mother    Hypertension Father    Hypertension Sister    Stroke Sister    Hypertension Brother    Stroke Brother     Social History:  reports that she has been smoking cigarettes. She has a 15.00 pack-year smoking history. She has never used smokeless tobacco. She reports current drug use. No history on file for alcohol use.The patient is alone today.  Allergies:  Allergies  Allergen Reactions   Metronidazole     Other reaction(s): Other (See Comments)   Rituximab     Other reaction(s): Other (See Comments)   Sulfa Antibiotics Rash    Current Medications: Current Outpatient Medications  Medication Sig Dispense Refill   alendronate (FOSAMAX) 70 MG tablet      amLODipine (NORVASC) 2.5 MG tablet Take 2.5 mg by mouth daily.     Ascorbic Acid (VITAMIN C WITH ROSE HIPS) 500 MG tablet Take 500 mg by mouth daily.     benzonatate (TESSALON) 100 MG capsule TAKE ONE CAPSULE BY MOUTH 3 TIMES A DAY AS NEEDED 60 capsule 5   calcium carbonate (OSCAL) 1500 (600 Ca) MG TABS tablet Take by mouth.     Cholecalciferol (VITAMIN D3) 125 MCG (5000 UT) TABS Take 1 tablet by mouth daily.     diclofenac  Sodium (VOLTAREN) 1 % GEL Apply topically.     diphenoxylate-atropine (LOMOTIL) 2.5-0.025 MG tablet Take 1-2 tablets by mouth every 6 (six) hours.     ibuprofen (ADVIL) 800 MG tablet      KLOR-CON M20 20 MEQ tablet TAKE 1 TABLET BY MOUTH 3 TIMES DAILY. (Patient taking differently: Take 20 mEq by mouth 3 (three) times daily. Patient voiced currently taking once daily provider aware) 270 tablet 1   lidocaine (LIDODERM) 5 % 2 patches daily.     lidocaine-prilocaine (EMLA) cream Apply 1 Application topically as needed. 30 g 5   Multiple Vitamin (MULTIVITAMIN WITH MINERALS) TABS tablet Take 1 tablet by mouth daily.     Omega-3 Fatty Acids (FISH OIL) 1000 MG CAPS Take 1 capsule by mouth daily.     omeprazole (PRILOSEC) 20 MG capsule TAKE 1 CAPSULE BY MOUTH EVERY DAY 90 capsule 3   oxyCODONE (ROXICODONE) 15 MG immediate release tablet Take 15 mg by mouth every 6 (six) hours as needed.     prochlorperazine (COMPAZINE) 10 MG tablet TAKE 1 TABLET BY MOUTH EVERY 6 HOURS AS NEEDED 90 tablet 1   VENCLEXTA 100 MG tablet Take 4 tablets (400 mg total) by mouth daily. 30 tablet 0   vitamin B-12 (CYANOCOBALAMIN) 100 MCG tablet Take 100 mcg by mouth daily.     Current Facility-Administered Medications  Medication Dose Route Frequency Provider Last Rate Last Admin   sodium chloride flush (NS) 0.9 % injection 10 mL  10 mL Intracatheter PRN [name], MD   10 mL at 06/11/22 1415   Facility-Administered Medications Ordered in Other Visits  Medication Dose Route Frequency Provider Last Rate Last Admin   influenza vac split quadrivalent PF (FLUARIX) injection 0.5 mL  0.5 mL Intramuscular Once [name], MD       sodium chloride flush (NS) 0.9 % injection 10 mL  10 mL Intracatheter PRN Belva Crome A, PA-C   10 mL at 09/26/20 1610        I,Gabriella Ballesteros,acting as a scribe for [name], MD.,have documented all relevant documentation on the behalf of [name],  MD,as directed by  [name], MD while  in the presence of [name], MD.

## 2022-06-12 ENCOUNTER — Ambulatory Visit: Payer: Medicare Other

## 2022-06-14 ENCOUNTER — Encounter: Payer: Self-pay | Admitting: Hematology and Oncology

## 2022-07-08 MED FILL — Immune Globulin (Human) IV Soln 5 GM/50ML: INTRAVENOUS | Qty: 450 | Status: AC

## 2022-07-08 NOTE — Progress Notes (Signed)
[facility]  [address] 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  She did not require treatment until 2008 and has since been through multiple therapies.  She is currently taking venetoclax 400 mg daily with good control of her disease for over 4 years. Her hemoglobin and platelets have slowly drifted down over the last 4 months. I will evaluate with iron studies, B12, folate, TSH and LDH. This may be due to venetoclax or progression of CLL.  There is no palpable splenomegaly or lymphadenopathy concerning for progression.  A moderate amount of atypical lymphocytes are seen, so we will likely schedule her for a bone marrow biopsy to further qualify.  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  Quantitative immunoglobulins in February revealed normal IgG and IgM and with a decreased IgA.  She will proceed with IVIG this week.    The patient understands the plans discussed today and is in agreement with them.  She knows to contact our office if she develops concerns prior to her next appointment.   I provided 30 minutes of face-to-face time during this encounter and > 50% was spent counseling as documented under my assessment and plan.    [name], PA-C  [facility] [address] [place] [address] Dept: [phone] Dept Fax: [phone]   Orders Placed This Encounter  Procedures   Ferritin    Standing Status:   Future    Number of Occurrences:   1    Standing Expiration Date:   07/09/2023   Folate    Standing Status:   Future    Number of Occurrences:   1     Standing Expiration Date:   07/09/2023   Iron and TIBC    Standing Status:   Future    Number of Occurrences:   1    Standing Expiration Date:   07/09/2023   Vitamin B12    Standing Status:   Future    Number of Occurrences:   1    Standing Expiration Date:   07/09/2023   TSH    Standing Status:   Future    Number of Occurrences:   1    Standing Expiration Date:   07/09/2023   Lactate dehydrogenase    Standing Status:   Future    Number of Occurrences:   1    Standing Expiration Date:   07/09/2023   CBC and differential    This external order was created through the Results Console.   CBC    This external order was created through the Results Console.   Basic metabolic panel    This external order was created through the Results Console.   Comprehensive metabolic panel    This external order was created through the Results Console.   Hepatic function panel    This external order was created through the Results Console.      CHIEF COMPLAINT:  CC: Chronic lymphocytic leukemia with associated combined immunodeficiency  Current Treatment: Venetoclax 400 mg daily with IVIG every 4 weeks  HISTORY OF PRESENT ILLNESS:  Samantha Warren is a 77 y.o. female with chronic  lymphocytic leukemia originally diagnosed in May 2005.  She was on observation until November 2008. She was initially treated with chlorambucil and prednisone, as she refused intravenous chemotherapy.  She had a partial response to this regimen.  By January 2010, she had progressive disease with a white count over 200,000, with associated anemia, splenomegaly, thrombocytopenia, and adenopathy.  She then received 5 cycles of fludarabine with good response.  She did well until January 2012, when she had progression once again.  She had a single dose of bendamustine and rituximab, which kept her disease under control for over a year.  However, she had a severe allergic reaction to the rituximab, so has not received further  rituximab.  She was hospitalized after the bendamustine because of severe pancytopenia and required multiple transfusions, so was placed on observation.  In March 2013, she had progression of disease again, so was treated with bendamustine for 6 cycles, at a 50% dose reduction, and once again had a excellent response.  She was on observation until August 2014, then had progression of disease, so was placed on ibrutinib 420 mg daily.  The ibrutinib had kept her disease under fairly good control, but then she develops severe bilateral lower extremity cellulitis in April 2016, so ibrutinib was placed on hold.  She had persistent cellulitis, as well as  Clostridium difficile colitis in May 2016 requiring hospitalization.  She was readmitted soon after discharge with worsening cellulitis, requiring a prolonged hospitalization.  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 She eventually had resolution of the cellulitis   As she was largely asymptomatic, we continued observation until February 2017, at which time she had worsening splenomegaly.  Repeat CT imaging in February showed progressive disease, with marked splenomegaly and bulky lymphadenopathy.  She was then placed back on ibrutinib 420mg  daily.  The lymphocytosis, anemia and thrombocytopenia slowly improved.  Due to neutropenia, ibrutinib was placed on hold in December 2017.  The ibrutinib was resumed in January 2018, but discontinued in August 2018 due to toxicities, mainly severe abdominal muscle cramping.  She was hospitalized in early September 2018 with urinary tract infection and pancytopenia.  She received multiple red blood cell and platelet transfusions during her stay.  She was admitted again in late September 2018 with perineal cellulitis extending to the lower abdomen and upper thigh, as well as persistent pancytopenia.  CT abdomen and pelvis revealed interval  decrease in the abdominal lymphadenopathy, chronic massive splenomegaly with scattered small splenic infarcts, and small left pleural effusion with bibasilar atelectasis.  She required packed red blood cells while hospitalized.  She was discharged home with home health and [facility]  We continued to follow her closely, but did not place her on a treatment due to the persistent cellulitis.  She then was admitted in October 2018 with severe hypercalcemia, with a calcium of 15.  She was treated with IV fluids, zoledronic acid and Calcitonin injections.  We continued to monitor her closely.  She had recurrent hypercalcemia, for which she received IV fluids and zolendronic acid as an outpatient.     Due to worsening pancytopenia, she underwent bone marrow biopsy in January 2019.   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  The cellulitis had improved to a point we felt she could be treated again, so she was placed on venetoclax CLL in January 2019.  She has continued on venetoclax 400 mg daily after ramp up dosing and has tolerated that fairly well, except for mild diarrhea.  Since starting venetoclax, she has had decreasing splenomegaly and improvement in her pancytopenia.  She initially continued to require IV fluids and zoledronic acid for the hypercalcemia.  Her last dose of zoledronic acid was given in March 2019.  In March, she had an irregular heart rhythm and EKG revealed occasional premature atrial complexes, as well as an incomplete right bundle branch block.  This was stable compared to EKG done in October 2018.  Quantitative immunoglobulins done in August 2019 revealed a normal IgG, with low IgA and IgM.  She has continued IVIG monthly.  She had an episode of Campylobacter diarrhea in November 2019, which was treated with azithromycin for 7 days with resolution of her diarrhea. Venetoclax was held temporarily  until her diarrhea resolved.  She has since tolerated venetoclax without significant difficulty.     In February 2022, she had a right diagnostic mammogram due to right breast subareolar tenderness, which did not reveal any evidence of malignancy.  CT abdomen in April did not reveal any acute abnormality or other findings to explain the left-sided abdominal pain.  There was resolution of previously seen gross splenomegaly.  There were prominent retroperitoneal lymph nodes, which had significantly diminished in size compared to prior exam.  A hiatal hernia was seen. There was descending colonic diverticulosis without evidence of acute diverticulitis.  Bilateral screening mammogram in May 2022 revealed possible asymmetry in the left breast.  Left diagnostic mammogram and ultrasound in June revealed a cluster of cysts/apocrine cyst in the left breast at 2 o'clock, 3 cm the nipple, measuring 6 x 3 x 6 mm, unchanged from the exam dated May 2021. There were no solid masses or suspicious lesions. She is up to date on colonoscopy with her last colonoscopy being in June 2022 with Dr. Jennye Boroughs, which was negative.  Bilateral screening mammogram in May 2023 did not reveal any evidence of malignancy.  Dr. Nedra Hai has been ordering her mammograms.  INTERVAL HISTORY:  Samantha Warren is here today for repeat clinical assessment prior to IVIG.  She states she continues venetoclax 400 mg daily.  She denies missed doses.  She denies abnormal bleeding or bruising. She denies fevers, chills or night sweats. She denies other signs or symptoms of infection. She denies abdominal pain, but reports pain under the pannus on occasion.  She reports chronic right knee pain, which is stable.  She rates her pain 3 out of 10 today.  She continues oxycodone 15 g every 6 hours as needed.  Her appetite is good and she denies unintentional weight loss. Her weight has been stable.  REVIEW OF SYSTEMS:  Review of Systems  Constitutional:  Positive for  fatigue. Negative for appetite change, chills, fever and unexpected weight change.  HENT:   Negative for lump/mass, mouth sores and sore throat.   Respiratory:  Negative for cough and shortness of breath.   Cardiovascular:  Negative for chest pain and leg swelling.  Gastrointestinal:  Negative for abdominal pain, constipation, diarrhea, nausea and vomiting.  Endocrine: Negative for hot flashes.  Genitourinary:  Negative for difficulty urinating, dysuria, frequency and hematuria.   Musculoskeletal:  Positive for arthralgias (Right knee, chronic, stable). Negative for back pain and myalgias.  Skin:  Negative for rash.  Neurological:  Negative for dizziness and headaches.  Hematological:  Negative for adenopathy. Does not bruise/bleed easily.  Psychiatric/Behavioral:  Negative for depression  and sleep disturbance. The patient is not nervous/anxious.      VITALS:  Blood pressure 130/72, pulse 69, temperature 98.3 F (36.8 C), temperature source Oral, resp. rate 18, height 5' 1.5" (1.562 m), weight 223 lb (101.2 kg), SpO2 100 %.  Wt Readings from Last 3 Encounters:  07/09/22 223 lb (101.2 kg)  06/11/22 223 lb (101.2 kg)  06/11/22 223 lb 8 oz (101.4 kg)    Body mass index is 41.45 kg/m.  Performance status (ECOG): 1 - Symptomatic but completely ambulatory  PHYSICAL EXAM:  Physical Exam Vitals and nursing note reviewed.  Constitutional:      General: She is not in acute distress.    Appearance: Normal appearance.  HENT:     Head: Normocephalic and atraumatic.     Mouth/Throat:     Mouth: Mucous membranes are moist.     Pharynx: Oropharynx is clear. No oropharyngeal exudate or posterior oropharyngeal erythema.  Eyes:     General: No scleral icterus.    Extraocular Movements: Extraocular movements intact.     Conjunctiva/sclera: Conjunctivae normal.     Pupils: Pupils are equal, round, and reactive to light.  Cardiovascular:     Rate and Rhythm: Normal rate and regular rhythm.      Heart sounds: Normal heart sounds. No murmur heard.    No friction rub. No gallop.  Pulmonary:     Effort: Pulmonary effort is normal.     Breath sounds: Normal breath sounds. No wheezing, rhonchi or rales.  Abdominal:     General: There is no distension.     Palpations: Abdomen is soft. There is no hepatomegaly, splenomegaly or mass.     Tenderness: There is no abdominal tenderness.  Musculoskeletal:        General: Normal range of motion.     Cervical back: Normal range of motion and neck supple. No tenderness.     Right lower leg: No edema.     Left lower leg: No edema.  Lymphadenopathy:     Cervical: No cervical adenopathy.     Upper Body:     Right upper body: No supraclavicular or axillary adenopathy.     Left upper body: No supraclavicular or axillary adenopathy.     Lower Body: No right inguinal adenopathy. No left inguinal adenopathy.  Skin:    General: Skin is warm and dry.     Coloration: Skin is not jaundiced.     Findings: No rash.  Neurological:     Mental Status: She is alert and oriented to person, place, and time.     Cranial Nerves: No cranial nerve deficit.  Psychiatric:        Mood and Affect: Mood normal.        Behavior: Behavior normal.        Thought Content: Thought content normal.    LABS:      Latest Ref Rng & Units 07/09/2022   12:00 AM 06/11/2022   12:00 AM 05/14/2022   12:00 AM  CBC  WBC  10.2     8.4     6.7      Hemoglobin 12.0 - 16.0 10.6     11.5     11.4      Hematocrit 36 - 46 32     34     35      Platelets 150 - 400 K/uL 38     44     46  This result is from an external source.      Latest Ref Rng & Units 07/09/2022   12:00 AM 06/11/2022   12:00 AM 05/14/2022   12:00 AM  CMP  BUN 4 - 21 13     24     21       Creatinine 0.5 - 1.1 0.7     0.9     0.8      Sodium 137 - 147 143     140     141      Potassium 3.5 - 5.1 mEq/L 3.6     4.5     4.1      Chloride 99 - 108 109     109     110      CO2 13 - 22 24     25     22        Calcium 8.7 - 10.7 8.7     8.8     8.7      Alkaline Phos 25 - 125 74     71     64      AST 13 - 35 31     29     30       ALT 7 - 35 U/L 18     19     18          This result is from an external source.     No results found for: "CEA1", "CEA" / No results found for: "CEA1", "CEA" No results found for: "PSA1" No results found for: "ZOX096" No results found for: "CAN125"  No results found for: "TOTALPROTELP", "ALBUMINELP", "A1GS", "A2GS", "BETS", "BETA2SER", "GAMS", "MSPIKE", "SPEI" No results found for: "TIBC", "FERRITIN", "IRONPCTSAT" No results found for: "LDH"  STUDIES:  No results found.    HISTORY:   Past Medical History:  Diagnosis Date   Cancer     Past Surgical History:  Procedure Laterality Date   arm surgery     GALLBLADDER SURGERY     TOTAL KNEE ARTHROPLASTY      Family History  Problem Relation Age of Onset   Cancer Mother    Hypertension Father    Hypertension Sister    Stroke Sister    Hypertension Brother    Stroke Brother     Social History:  reports that she has been smoking cigarettes. She has a 15.00 pack-year smoking history. She has never used smokeless tobacco. She reports current drug use. No history on file for alcohol use.The patient is alone today.  Allergies:  Allergies  Allergen Reactions   Metronidazole     Other reaction(s): Other (See Comments)   Rituximab     Other reaction(s): Other (See Comments)   Sulfa Antibiotics Rash    Current Medications: Current Outpatient Medications  Medication Sig Dispense Refill   alendronate (FOSAMAX) 70 MG tablet      amLODipine (NORVASC) 2.5 MG tablet Take 2.5 mg by mouth daily.     Ascorbic Acid (VITAMIN C WITH ROSE HIPS) 500 MG tablet Take 500 mg by mouth daily.     benzonatate (TESSALON) 100 MG capsule TAKE ONE CAPSULE BY MOUTH 3 TIMES A DAY AS NEEDED 60 capsule 5   calcium carbonate (OSCAL) 1500 (600 Ca) MG TABS tablet Take by mouth.     Cholecalciferol (VITAMIN D3) 125 MCG (5000 UT)  TABS Take 1 tablet by mouth daily.     diclofenac Sodium (VOLTAREN) 1 % GEL Apply  topically.     diphenoxylate-atropine (LOMOTIL) 2.5-0.025 MG tablet Take 1-2 tablets by mouth every 6 (six) hours.     ibuprofen (ADVIL) 800 MG tablet      KLOR-CON M20 20 MEQ tablet TAKE 1 TABLET BY MOUTH 3 TIMES DAILY. (Patient taking differently: Take 20 mEq by mouth 3 (three) times daily. Patient voiced currently taking once daily provider aware) 270 tablet 1   lidocaine (LIDODERM) 5 % 2 patches daily.     lidocaine-prilocaine (EMLA) cream Apply 1 Application topically as needed. 30 g 5   Multiple Vitamin (MULTIVITAMIN WITH MINERALS) TABS tablet Take 1 tablet by mouth daily.     Omega-3 Fatty Acids (FISH OIL) 1000 MG CAPS Take 1 capsule by mouth daily.     omeprazole (PRILOSEC) 20 MG capsule TAKE 1 CAPSULE BY MOUTH EVERY DAY 90 capsule 3   oxyCODONE (ROXICODONE) 15 MG immediate release tablet Take 15 mg by mouth every 6 (six) hours as needed.     prochlorperazine (COMPAZINE) 10 MG tablet TAKE 1 TABLET BY MOUTH EVERY 6 HOURS AS NEEDED 90 tablet 1   VENCLEXTA 100 MG tablet Take 4 tablets (400 mg total) by mouth daily. 30 tablet 0   vitamin B-12 (CYANOCOBALAMIN) 100 MCG tablet Take 100 mcg by mouth daily.     Current Facility-Administered Medications  Medication Dose Route Frequency Provider Last Rate Last Admin   sodium chloride flush (NS) 0.9 % injection 10 mL  10 mL Intracatheter PRN [name], MD   10 mL at 07/09/22 1610   Facility-Administered Medications Ordered in Other Visits  Medication Dose Route Frequency Provider Last Rate Last Admin   diphenhydrAMINE (BENADRYL) injection 25 mg  25 mg Intravenous Once [name], MD       influenza vac split quadrivalent PF (FLUARIX) injection 0.5 mL  0.5 mL Intramuscular Once [name], MD       sodium chloride flush (NS) 0.9 % injection 10 mL  10 mL Intracatheter PRN Aunesty Tyson, Harvin Hazel A, PA-C   10 mL at 09/26/20 [phone]

## 2022-07-08 NOTE — Assessment & Plan Note (Signed)
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  She did not require treatment until 2008 and has since been through multiple therapies.  She is currently taking venetoclax 400 mg daily with good control of her disease for over 4 years. Her hemoglobin and platelets have slowly drifted down over the last 4 months. I will evaluate with iron studies, B12, folate, TSH and LDH. This may be due to venetoclax or progression of CLL.  There is no palpable splenomegaly or lymphadenopathy concerning for progression.  A moderate amount of atypical lymphocytes are seen, so we will likely schedule her for a bone marrow biopsy to further qualify.

## 2022-07-08 NOTE — Assessment & Plan Note (Signed)
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

## 2022-07-09 ENCOUNTER — Inpatient Hospital Stay: Payer: Medicare Other

## 2022-07-09 ENCOUNTER — Other Ambulatory Visit: Payer: Self-pay

## 2022-07-09 ENCOUNTER — Encounter: Payer: Self-pay | Admitting: Oncology

## 2022-07-09 ENCOUNTER — Encounter: Payer: Self-pay | Admitting: Hematology and Oncology

## 2022-07-09 ENCOUNTER — Inpatient Hospital Stay: Payer: Medicare Other | Attending: Hematology and Oncology | Admitting: Hematology and Oncology

## 2022-07-09 VITALS — BP 130/72 | HR 69 | Temp 98.3°F | Resp 18 | Ht 61.5 in | Wt 223.0 lb

## 2022-07-09 VITALS — BP 131/70 | HR 77 | Temp 98.0°F | Resp 18 | Ht 61.5 in | Wt 223.0 lb

## 2022-07-09 DIAGNOSIS — C9110 Chronic lymphocytic leukemia of B-cell type not having achieved remission: Secondary | ICD-10-CM

## 2022-07-09 DIAGNOSIS — D696 Thrombocytopenia, unspecified: Secondary | ICD-10-CM

## 2022-07-09 DIAGNOSIS — D819 Combined immunodeficiency, unspecified: Secondary | ICD-10-CM

## 2022-07-09 DIAGNOSIS — Z79899 Other long term (current) drug therapy: Secondary | ICD-10-CM | POA: Diagnosis not present

## 2022-07-09 DIAGNOSIS — D649 Anemia, unspecified: Secondary | ICD-10-CM

## 2022-07-09 DIAGNOSIS — Z8619 Personal history of other infectious and parasitic diseases: Secondary | ICD-10-CM | POA: Insufficient documentation

## 2022-07-09 DIAGNOSIS — D801 Nonfamilial hypogammaglobulinemia: Secondary | ICD-10-CM | POA: Diagnosis not present

## 2022-07-09 LAB — BASIC METABOLIC PANEL
BUN: 13 (ref 4–21)
CO2: 24 — AB (ref 13–22)
Chloride: 109 — AB (ref 99–108)
Creatinine: 0.7 (ref 0.5–1.1)
EGFR: 60
Glucose: 116
Potassium: 3.6 mEq/L (ref 3.5–5.1)
Sodium: 143 (ref 137–147)

## 2022-07-09 LAB — COMPREHENSIVE METABOLIC PANEL
Albumin: 4 (ref 3.5–5.0)
Calcium: 8.7 (ref 8.7–10.7)

## 2022-07-09 LAB — TSH: TSH: 4.717 u[IU]/mL — ABNORMAL HIGH (ref 0.350–4.500)

## 2022-07-09 LAB — CBC AND DIFFERENTIAL
Eosinophils, %: 4
HCT: 32 — AB (ref 36–46)
Hemoglobin: 10.6 — AB (ref 12.0–16.0)
LYMPH%: 84 %
NEUT%: 12 %
Neutrophils Absolute: 1.22
Platelets: 38 10*3/uL — AB (ref 150–400)
WBC: 10.2

## 2022-07-09 LAB — VITAMIN B12: Vitamin B-12: 1045 pg/mL — ABNORMAL HIGH (ref 180–914)

## 2022-07-09 LAB — HEPATIC FUNCTION PANEL
ALT: 18 U/L (ref 7–35)
AST: 31 (ref 13–35)
Alkaline Phosphatase: 74 (ref 25–125)
Bilirubin, Total: 0.4

## 2022-07-09 LAB — LACTATE DEHYDROGENASE: LDH: 378 U/L — ABNORMAL HIGH (ref 98–192)

## 2022-07-09 LAB — FOLATE: Folate: >40 ng/mL (ref >5.9)

## 2022-07-09 LAB — CBC: RBC: 3.48 — AB (ref 3.87–5.11)

## 2022-07-09 LAB — IRON AND TIBC
Iron: 88 ug/dL (ref 28–170)
Saturation Ratios: 27 % (ref 10.4–31.8)
TIBC: 321 ug/dL (ref 250–450)
UIBC: 233 ug/dL

## 2022-07-09 LAB — FERRITIN: Ferritin: 790 ng/mL — ABNORMAL HIGH (ref 11–307)

## 2022-07-09 MED ORDER — ACETAMINOPHEN 325 MG PO TABS
650.0000 mg | ORAL_TABLET | Freq: Once | ORAL | Status: AC
  Administered 2022-07-09: 650 mg via ORAL
  Filled 2022-07-09: qty 2

## 2022-07-09 MED ORDER — IMMUNE GLOBULIN (HUMAN) 10 GM/100ML IV SOLN
0.9000 g/kg | Freq: Once | INTRAVENOUS | Status: AC
  Administered 2022-07-09: 45 g via INTRAVENOUS
  Filled 2022-07-09: qty 400

## 2022-07-09 MED ORDER — HEPARIN SOD (PORK) LOCK FLUSH 100 UNIT/ML IV SOLN
500.0000 [IU] | Freq: Once | INTRAVENOUS | Status: AC | PRN
  Administered 2022-07-09: 500 [IU]

## 2022-07-09 MED ORDER — DEXTROSE 5 % IV SOLN: Freq: Once | INTRAVENOUS | Status: AC

## 2022-07-09 MED ORDER — DIPHENHYDRAMINE HCL 50 MG/ML IJ SOLN
25.0000 mg | Freq: Once | INTRAMUSCULAR | Status: AC
  Administered 2022-07-09: 25 mg via INTRAVENOUS
  Filled 2022-07-09: qty 1

## 2022-07-09 MED ORDER — SODIUM CHLORIDE 0.9% FLUSH
10.0000 mL | INTRAVENOUS | Status: DC | PRN
  Administered 2022-07-09: 10 mL

## 2022-07-09 MED ORDER — DIPHENHYDRAMINE HCL 50 MG/ML IJ SOLN
25.0000 mg | Freq: Once | INTRAMUSCULAR | Status: AC
  Administered 2022-07-09: 25 mg via INTRAVENOUS

## 2022-07-09 NOTE — Patient Instructions (Signed)

## 2022-07-10 ENCOUNTER — Telehealth: Payer: Self-pay

## 2022-07-10 ENCOUNTER — Telehealth: Payer: Self-pay | Admitting: Hematology and Oncology

## 2022-07-10 NOTE — Telephone Encounter (Signed)
-----   Message from [name], PA-C sent at 07/10/2022  8:54 AM EDT ----- Nicky Pugh, her counts have been worsening, so Dr. Gilman Buttner wants to do a bone marrow. Will you schedule please? She is aware and cannot do Thurs or Fri this week. Thanks

## 2022-07-10 NOTE — Telephone Encounter (Signed)
If it is ok with you. We can do the bone marrow Thursday 4/18 at 0830.

## 2022-07-10 NOTE — Telephone Encounter (Signed)
Spoke with patient regarding additional lab results. B12 is elevated, so instructed her to discontinue B12 supplements.  Her ferritin was elevated, but her multivitamin does not contain iron.  We will plan to repeat the ferritin at her next visit.  The TSH was borderline at 4.7.  The LDH was elevated and bilirubin normal, so hemolysis is unlikely.  Dr. Gilman Buttner therefore recommended bone marrow biopsy and we will get that scheduled.  The patient expressed understanding and is in agreement.

## 2022-07-17 NOTE — Progress Notes (Addendum)
[facility]  [address] 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  She did not require treatment until 2008 and has since been through multiple therapies.  She is currently taking venetoclax 400 mg daily with good control of her disease for over 5 years.    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  She wanted to space out her treatments and we will plan to change to every 2-3 months after the winter season.   Thrombocytopenia Her platelet count has slowly declined over the last few months and was the lowest last week at 38,000. I am concerned this may be an indicator of progressive disease, but I find no splenomegaly or adenopathy or other signs of her CLL. We will continue to monitor this closely on a monthly basis. I have cautioned her about the increased risk for bruising or bleeding.   Procedure note: Bone marrow  Multiple attempts were made to aspirate her bone marrow unsuccessfully. I then used a jamshidi needle and was still unable to obtain an aspirate or biopsy. The patient tolerated the procedure well. I will schedule a repeat bone marrow with interventional radiology and plan to see her back a few days later. She will keep her May appointment for routine follow-ups as usual and monthly IVIG. The patient understands the plans discussed today and is in agreement with them.  She knows to contact our office if she develops concerns prior to her next appointment.    Samantha Beckwith, MD  [facility] [address] [place] [address] Dept: [phone] Dept Fax: [phone]    No orders of the defined types were placed in this encounter.     CHIEF COMPLAINT:  CC: CLL with combined immunodeficiency  Current Treatment: Venetoclax/IVIG  HISTORY OF PRESENT ILLNESS:  Samantha Warren is a 77 y.o. 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  She was on observation until November 2008. She was initially treated with chlorambucil and prednisone, as she refused intravenous chemotherapy.  She had a partial response to this regimen.  By January 2010, she had progressive disease with a white count over 200,000, with associated anemia, splenomegaly, thrombocytopenia, and adenopathy.  She then received 5 cycles of fludarabine with good response.  She did well until January 2012, when she had progression once again.  She had a single dose of bendamustine and rituximab, which kept her disease under control for over a year.  However, she had a severe allergic reaction to the rituximab, so has not received further rituximab.  She was hospitalized after the bendamustine because of severe pancytopenia and required multiple transfusions, so was placed on observation.  In March 2013, she had progression of disease again, so was treated with bendamustine for 6 cycles, at a 50% dose reduction, and once again had a excellent response.  She was on observation until August 2014, then had progression of disease, so was placed on ibrutinib 420 mg daily.  The ibrutinib had kept her disease under fairly good control, but then she develops severe bilateral lower extremity cellulitis in April 2016, so ibrutinib was placed on hold.  She had persistent cellulitis, as well  as  Clostridium difficile colitis in May 2016 requiring hospitalization.  She was readmitted soon after discharge with worsening cellulitis, requiring a prolonged hospitalization.  She was also found to have severe combined immunodeficiency secondary to her CLL, so began receiving IVIG monthly in June  2016. She eventually had resolution of the cellulitis   As she was largely asymptomatic, we continued observation until February 2017, at which time she had worsening splenomegaly.  Repeat CT imaging in February showed progressive disease, with marked splenomegaly and bulky lymphadenopathy.  She was then placed back on ibrutinib 420mg  daily.  The lymphocytosis, anemia and thrombocytopenia slowly improved.  Due to neutropenia, ibrutinib was placed on hold in December 2017.  The ibrutinib was resumed in January 2018, but discontinued in August 2018 due to toxicities, mainly severe abdominal muscle cramping.  She was hospitalized in early September 2018 with urinary tract infection and pancytopenia.  She received multiple red blood cell and platelet transfusions during her stay.  She was admitted again in late September 2018 with perineal cellulitis extending to the lower abdomen and upper thigh, as well as persistent pancytopenia.  CT abdomen and pelvis revealed interval decrease in the abdominal lymphadenopathy, chronic massive splenomegaly with scattered small splenic infarcts, and small left pleural effusion with bibasilar atelectasis.  She required packed red blood cells while hospitalized.  She was discharged home with home health and [facility].  We continued to follow her closely, but did not place her on a treatment due to the persistent cellulitis.  She then was admitted in October 2018 with severe hypercalcemia, with a calcium of 15.  She was treated with IV fluids, zoledronic acid and Calcitonin injections.  We continued to monitor her closely.  She had recurrent hypercalcemia, for which she received IV fluids and zolendronic acid as an outpatient.     Due to worsening pancytopenia, she underwent bone marrow biopsy in January 2019.   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  The cellulitis had improved to a  point we felt she could be treated again, so she was placed on venetoclax CLL in January 2019.  She has continued on venetoclax 400 mg daily after ramp up dosing and has tolerated that fairly well, except for mild diarrhea.  Since starting venetoclax, she has had decreasing splenomegaly and improvement in her pancytopenia.  She initially continued to require IV fluids and zoledronic acid for the hypercalcemia.  Her last dose of zoledronic acid was given in March 2019.  In March, she had an irregular heart rhythm and EKG revealed occasional premature atrial complexes, as well as an incomplete right bundle branch block.  This was stable compared to EKG done in October 2018.  Quantitative immunoglobulins done in August 2019 revealed a normal IgG, with low IgA and IgM.  She has continued IVIG monthly.  She had an episode of Campylobacter diarrhea in November 2019, which was treated with azithromycin for 7 days with resolution of her diarrhea. Venetoclax was held temporarily until her diarrhea resolved.  She has since tolerated venetoclax without significant difficulty.     In February 2022, she had a right diagnostic mammogram due to right breast subareolar tenderness, which did not reveal any evidence of malignancy.  CT abdomen in April did not reveal any acute abnormality or other findings to explain the left-sided abdominal pain.  There was resolution of previously seen gross splenomegaly.  There were prominent retroperitoneal lymph nodes, which had  significantly diminished in size compared to prior exam.  A hiatal hernia was seen. There was descending colonic diverticulosis without evidence of acute diverticulitis.  Bilateral screening mammogram in May 2022 revealed possible asymmetry in the left breast.  Left diagnostic mammogram and ultrasound in June revealed a cluster of cysts/apocrine cyst in the left breast at 2 o'clock, 3 cm the nipple, measuring 6 x 3 x 6 mm, unchanged from the exam dated May 2021. There  were no solid masses or suspicious lesions. She is up to date on colonoscopy with her last colonoscopy being in June 2022 with Dr. Jennye Boroughs.  This was negative, and repeat in 10 years was advised.    INTERVAL HISTORY:  Samantha Warren is here today for repeat clinical assessment for CLL with combined immunodeficiency after her IVIG treatment. Patient states that she feels well.  However, her platelet count has been steadily decreasing. Her platelets were 50,000 in early February, 46,000 mid February, then 44,000, and 38,000 last week. I have therefore recommended a bone marrow to evaluate the status of her CLL.  Procedure Note Bone Marrow  A timeout was held to ensure this was the correct procedure on the correct patient. The patient was placed on her left side and the right posterior illiac crest was prepped and draped in sterile fashion. The area was infiltrated with 1% Xylocaine. Multiple attempts were made to aspirate her bone marrow unsuccessfully. I then used a jamshidi needle and was still unable to obtain an aspirate or biopsy. The patient tolerated the procedure well. I will have to reschedule this with interventional radiology so they can use imaging and probably a drill.   REVIEW OF SYSTEMS:  Review of Systems  Constitutional: Negative.  Negative for appetite change, chills, diaphoresis, fatigue, fever and unexpected weight change.  HENT:  Negative.  Negative for hearing loss, lump/mass, mouth sores, nosebleeds, sore throat, tinnitus, trouble swallowing and voice change.   Eyes: Negative.  Negative for eye problems and icterus.  Respiratory: Negative.  Negative for chest tightness, cough, hemoptysis, shortness of breath and wheezing.   Cardiovascular: Negative.  Negative for chest pain, leg swelling and palpitations.  Gastrointestinal: Negative.  Negative for abdominal distention, abdominal pain, blood in stool, constipation, diarrhea, nausea, rectal pain and vomiting.  Endocrine: Negative.   Negative for hot flashes.  Genitourinary: Negative.  Negative for bladder incontinence, difficulty urinating, dyspareunia, dysuria, frequency, hematuria, menstrual problem, nocturia, pelvic pain, vaginal bleeding and vaginal discharge.   Musculoskeletal:  Positive for arthralgias (in both knees). Negative for back pain, flank pain, gait problem, myalgias, neck pain and neck stiffness.  Skin: Negative.  Negative for itching, rash and wound.  Neurological:  Negative for dizziness, extremity weakness, gait problem, headaches, light-headedness, numbness, seizures and speech difficulty.  Hematological: Negative.  Negative for adenopathy. Does not bruise/bleed easily.  Psychiatric/Behavioral: Negative.  Negative for confusion, decreased concentration, depression, sleep disturbance and suicidal ideas. The patient is not nervous/anxious.      VITALS:  Blood pressure 113/63, pulse 89, temperature 98.2 F (36.8 C), temperature source Oral, resp. rate 18, height 5' 1.5" (1.562 m), weight 221 lb 1.6 oz (100.3 kg), SpO2 97 %.  Wt Readings from Last 3 Encounters:  07/24/22 220 lb 6.4 oz (100 kg)  07/24/22 220 lb 6.4 oz (100 kg)  07/18/22 221 lb 1.6 oz (100.3 kg)    Body mass index is 41.1 kg/m.  Performance status (ECOG): 1 - Symptomatic but completely ambulatory  PHYSICAL EXAM:  Physical Exam Vitals and nursing note  reviewed.  Constitutional:      General: She is not in acute distress.    Appearance: Normal appearance. She is normal weight. She is not ill-appearing, toxic-appearing or diaphoretic.  HENT:     Head: Normocephalic and atraumatic.     Right Ear: Tympanic membrane, ear canal and external ear normal. There is no impacted cerumen.     Left Ear: Tympanic membrane and external ear normal. There is no impacted cerumen.     Nose: Nose normal. No congestion or rhinorrhea.     Mouth/Throat:     Mouth: Mucous membranes are moist.     Pharynx: Oropharynx is clear. No oropharyngeal exudate or  posterior oropharyngeal erythema.  Eyes:     General: No scleral icterus.       Right eye: No discharge.        Left eye: No discharge.     Extraocular Movements: Extraocular movements intact.     Conjunctiva/sclera: Conjunctivae normal.     Pupils: Pupils are equal, round, and reactive to light.  Neck:     Vascular: No carotid bruit.     Comments: A swelling I the submandibular area measuring 2-3 cm, firm, non tender.  Cardiovascular:     Rate and Rhythm: Normal rate and regular rhythm.     Pulses: Normal pulses.     Heart sounds: Normal heart sounds. No murmur heard.    No friction rub. No gallop.  Pulmonary:     Effort: Pulmonary effort is normal. No respiratory distress.     Breath sounds: Normal breath sounds. No stridor. No wheezing, rhonchi or rales.  Chest:     Chest wall: No tenderness.  Abdominal:     General: Bowel sounds are normal. There is no distension.     Palpations: Abdomen is soft. There is no hepatomegaly, splenomegaly or mass.     Tenderness: There is no abdominal tenderness. There is no right CVA tenderness, left CVA tenderness, guarding or rebound.     Hernia: No hernia is present.  Musculoskeletal:        General: No swelling, tenderness, deformity or signs of injury. Normal range of motion.     Cervical back: Normal range of motion and neck supple. No rigidity or tenderness.     Right lower leg: No edema.     Left lower leg: No edema.  Lymphadenopathy:     Cervical: No cervical adenopathy.     Right cervical: No superficial, deep or posterior cervical adenopathy.    Left cervical: No superficial, deep or posterior cervical adenopathy.     Upper Body:     Right upper body: No supraclavicular, axillary or pectoral adenopathy.     Left upper body: No supraclavicular, axillary or pectoral adenopathy.     Lower Body: No right inguinal adenopathy. No left inguinal adenopathy.  Skin:    General: Skin is warm and dry.     Coloration: Skin is not jaundiced or  pale.     Findings: No bruising, erythema, lesion or rash.  Neurological:     General: No focal deficit present.     Mental Status: She is alert and oriented to person, place, and time. Mental status is at baseline.     Cranial Nerves: No cranial nerve deficit.     Sensory: No sensory deficit.     Motor: No weakness.     Coordination: Coordination normal.     Gait: Gait normal.     Deep Tendon Reflexes: Reflexes normal.  Psychiatric:        Mood and Affect: Mood normal.        Behavior: Behavior normal.        Thought Content: Thought content normal.        Judgment: Judgment normal.    LABS:      Latest Ref Rng & Units 07/18/2022   12:00 AM 07/09/2022   12:00 AM 06/11/2022   12:00 AM  CBC  WBC  11.5     10.2     8.4      Hemoglobin 12.0 - 16.0 10.3     10.6     11.5      Hematocrit 36 - 46 31     32     34      Platelets 150 - 400 K/uL 37     38     44         This result is from an external source.      Latest Ref Rng & Units 07/18/2022    8:21 AM 07/09/2022   12:00 AM 06/11/2022   12:00 AM  CMP  Glucose 70 - 99 mg/dL 161     BUN 8 - 23 mg/dL 19  13     24       Creatinine 0.44 - 1.00 mg/dL 0.96  0.7     0.9      Sodium 135 - 145 mmol/L 139  143     140      Potassium 3.5 - 5.1 mmol/L 3.5  3.6     4.5      Chloride 98 - 111 mmol/L 107  109     109      CO2 22 - 32 mmol/L 22  24     25       Calcium 8.9 - 10.3 mg/dL 8.5  8.7     8.8      Total Protein 6.5 - 8.1 g/dL 7.5     Total Bilirubin 0.3 - 1.2 mg/dL 0.8     Alkaline Phos 38 - 126 U/L 66  74     71      AST 15 - 41 U/L 25  31     29       ALT 0 - 44 U/L 15  18     19          This result is from an external source.   No results found for: "CEA1", "CEA" / No results found for: "CEA1", "CEA" No results found for: "PSA1" No results found for: "EAV409" No results found for: "CAN125"  No results found for: "TOTALPROTELP", "ALBUMINELP", "A1GS", "A2GS", "BETS", "BETA2SER", "GAMS", "MSPIKE", "SPEI" Lab Results   Component Value Date   TIBC 321 07/09/2022   FERRITIN 790 (H) 07/09/2022   IRONPCTSAT 27 07/09/2022   Lab Results  Component Value Date   LDH 378 (H) 07/09/2022   Component Ref Range & Units 07/09/2022  Vitamin B-12 180 - 914 pg/mL 1,045 High      Component Ref Range & Units 07/09/2022  Folate >5.9 ng/mL >40.0     Component Ref Range & Units 07/09/2022  LDH 98 - 192 U/L 378 High    Component Ref Range & Units 07/09/2022  TSH 0.350 - 4.500 uIU/mL 4.717 High       STUDIES:     HISTORY:   Past Medical History:  Diagnosis Date   Cancer [facility])     Past Surgical History:  Procedure  Laterality Date   arm surgery     GALLBLADDER SURGERY     TOTAL KNEE ARTHROPLASTY      Family History  Problem Relation Age of Onset   Cancer Mother    Hypertension Father    Hypertension Sister    Stroke Sister    Hypertension Brother    Stroke Brother     Social History:  reports that she has been smoking cigarettes. She has a 15.00 pack-year smoking history. She has never used smokeless tobacco. She reports current drug use. No history on file for alcohol use.The patient is alone today.  Allergies:  Allergies  Allergen Reactions   Metronidazole     Other reaction(s): Other (See Comments)   Rituximab     Other reaction(s): Other (See Comments)   Sulfa Antibiotics Rash    Current Medications: Current Outpatient Medications  Medication Sig Dispense Refill   alendronate (FOSAMAX) 70 MG tablet      amLODipine (NORVASC) 2.5 MG tablet Take 2.5 mg by mouth daily.     Ascorbic Acid (VITAMIN C WITH ROSE HIPS) 500 MG tablet Take 500 mg by mouth daily.     benzonatate (TESSALON) 100 MG capsule TAKE ONE CAPSULE BY MOUTH 3 TIMES A DAY AS NEEDED 60 capsule 5   calcium carbonate (OSCAL) 1500 (600 Ca) MG TABS tablet Take by mouth.     Cholecalciferol (VITAMIN D3) 125 MCG (5000 UT) TABS Take 1 tablet by mouth daily.     diclofenac Sodium (VOLTAREN) 1 % GEL Apply topically.      diphenoxylate-atropine (LOMOTIL) 2.5-0.025 MG tablet Take 1-2 tablets by mouth every 6 (six) hours.     ibuprofen (ADVIL) 800 MG tablet      KLOR-CON M20 20 MEQ tablet TAKE 1 TABLET BY MOUTH 3 TIMES DAILY. (Patient taking differently: Take 20 mEq by mouth 3 (three) times daily. Patient voiced currently taking once daily provider aware) 270 tablet 1   lidocaine (LIDODERM) 5 % 2 patches daily.     lidocaine-prilocaine (EMLA) cream Apply 1 Application topically as needed. 30 g 5   Multiple Vitamin (MULTIVITAMIN WITH MINERALS) TABS tablet Take 1 tablet by mouth daily.     Omega-3 Fatty Acids (FISH OIL) 1000 MG CAPS Take 1 capsule by mouth daily.     omeprazole (PRILOSEC) 20 MG capsule TAKE 1 CAPSULE BY MOUTH EVERY DAY 90 capsule 3   oxyCODONE (ROXICODONE) 15 MG immediate release tablet Take 15 mg by mouth every 6 (six) hours as needed.     prochlorperazine (COMPAZINE) 10 MG tablet TAKE 1 TABLET BY MOUTH EVERY 6 HOURS AS NEEDED 90 tablet 1   VENCLEXTA 100 MG tablet Take 4 tablets (400 mg total) by mouth daily. 30 tablet 0   vitamin B-12 (CYANOCOBALAMIN) 100 MCG tablet Take 100 mcg by mouth daily.     No current facility-administered medications for this visit.   Facility-Administered Medications Ordered in Other Visits  Medication Dose Route Frequency Provider Last Rate Last Admin   influenza vac split quadrivalent PF (FLUARIX) injection 0.5 mL  0.5 mL Intramuscular Once Samantha Beckwith, MD       sodium chloride flush (NS) 0.9 % injection 10 mL  10 mL Intracatheter PRN Mosher, Harvin Hazel A, PA-C   10 mL at 09/26/20 0102      I,Jasmine M Lassiter,acting as a scribe for Samantha Beckwith, MD.,have documented all relevant documentation on the behalf of Samantha Beckwith, MD,as directed by  Samantha Beckwith, MD while in  the presence of Samantha Beckwith, MD.

## 2022-07-18 ENCOUNTER — Other Ambulatory Visit: Payer: Self-pay | Admitting: Oncology

## 2022-07-18 ENCOUNTER — Telehealth: Payer: Self-pay | Admitting: Oncology

## 2022-07-18 ENCOUNTER — Inpatient Hospital Stay: Payer: Medicare Other

## 2022-07-18 ENCOUNTER — Encounter: Payer: Self-pay | Admitting: Oncology

## 2022-07-18 ENCOUNTER — Inpatient Hospital Stay (INDEPENDENT_AMBULATORY_CARE_PROVIDER_SITE_OTHER): Payer: Medicare Other | Admitting: Oncology

## 2022-07-18 VITALS — BP 113/63 | HR 89 | Temp 98.2°F | Resp 18 | Ht 61.5 in | Wt 221.1 lb

## 2022-07-18 DIAGNOSIS — C9110 Chronic lymphocytic leukemia of B-cell type not having achieved remission: Secondary | ICD-10-CM

## 2022-07-18 DIAGNOSIS — D61818 Other pancytopenia: Secondary | ICD-10-CM | POA: Diagnosis not present

## 2022-07-18 LAB — CMP (CANCER CENTER ONLY)
ALT: 15 U/L (ref 0–44)
AST: 25 U/L (ref 15–41)
Albumin: 3.8 g/dL (ref 3.5–5.0)
Alkaline Phosphatase: 66 U/L (ref 38–126)
Anion gap: 10 (ref 5–15)
BUN: 19 mg/dL (ref 8–23)
CO2: 22 mmol/L (ref 22–32)
Calcium: 8.5 mg/dL — ABNORMAL LOW (ref 8.9–10.3)
Chloride: 107 mmol/L (ref 98–111)
Creatinine: 1.08 mg/dL — ABNORMAL HIGH (ref 0.44–1.00)
GFR, Estimated: 54 mL/min — ABNORMAL LOW (ref >60)
Glucose, Bld: 121 mg/dL — ABNORMAL HIGH (ref 70–99)
Potassium: 3.5 mmol/L (ref 3.5–5.1)
Sodium: 139 mmol/L (ref 135–145)
Total Bilirubin: 0.8 mg/dL (ref 0.3–1.2)
Total Protein: 7.5 g/dL (ref 6.5–8.1)

## 2022-07-18 LAB — CBC AND DIFFERENTIAL
HCT: 31 — AB (ref 36–46)
Hemoglobin: 10.3 — AB (ref 12.0–16.0)
Neutrophils Absolute: 1.84
Platelets: 37 10*3/uL — AB (ref 150–400)
WBC: 11.5

## 2022-07-18 LAB — CBC: RBC: 3.41 — AB (ref 3.87–5.11)

## 2022-07-18 NOTE — Telephone Encounter (Signed)
07/18/22 Bone Marrow appt scheduled and confirmed with patient

## 2022-07-24 ENCOUNTER — Other Ambulatory Visit: Payer: Self-pay | Admitting: Hematology and Oncology

## 2022-07-24 ENCOUNTER — Telehealth: Payer: Self-pay

## 2022-07-24 ENCOUNTER — Inpatient Hospital Stay: Payer: Medicare Other

## 2022-07-24 DIAGNOSIS — R197 Diarrhea, unspecified: Secondary | ICD-10-CM

## 2022-07-24 NOTE — Telephone Encounter (Signed)
error 

## 2022-07-25 ENCOUNTER — Other Ambulatory Visit (HOSPITAL_COMMUNITY)
Admission: RE | Admit: 2022-07-25 | Discharge: 2022-07-25 | Disposition: A | Discharge status: Home / Self Care | Payer: Medicare Other | Source: Other Acute Inpatient Hospital | Attending: Oncology | Admitting: Oncology

## 2022-07-25 ENCOUNTER — Telehealth: Payer: Self-pay

## 2022-07-25 DIAGNOSIS — D696 Thrombocytopenia, unspecified: Secondary | ICD-10-CM | POA: Insufficient documentation

## 2022-07-25 DIAGNOSIS — D649 Anemia, unspecified: Secondary | ICD-10-CM | POA: Insufficient documentation

## 2022-07-25 DIAGNOSIS — C9110 Chronic lymphocytic leukemia of B-cell type not having achieved remission: Secondary | ICD-10-CM | POA: Insufficient documentation

## 2022-07-25 DIAGNOSIS — R197 Diarrhea, unspecified: Secondary | ICD-10-CM | POA: Insufficient documentation

## 2022-07-25 LAB — C DIFFICILE QUICK SCREEN W PCR REFLEX
C Diff antigen: NEGATIVE
C Diff interpretation: NOT DETECTED
C Diff toxin: NEGATIVE

## 2022-07-25 NOTE — Telephone Encounter (Addendum)
07/25/22 Pt reports diarrhea continues. She wants medication to stop it and to know results of the stool sample she brought up here today.   07/26/22 Cecil Cobbs, took care of this issue. Please see documentation on 07/26/2022.

## 2022-07-26 ENCOUNTER — Telehealth: Payer: Self-pay

## 2022-07-26 NOTE — Telephone Encounter (Signed)
Patient request refill on lomotil, [facility].

## 2022-07-26 NOTE — Telephone Encounter (Signed)
Patient voiced understanding.

## 2022-07-26 NOTE — Telephone Encounter (Signed)
See previous message

## 2022-07-28 LAB — GI PATHOGEN PANEL BY PCR, STOOL

## 2022-07-29 ENCOUNTER — Encounter: Payer: Self-pay | Admitting: Oncology

## 2022-07-29 ENCOUNTER — Other Ambulatory Visit: Payer: Self-pay | Admitting: Hematology and Oncology

## 2022-07-29 LAB — SURGICAL PATHOLOGY

## 2022-08-01 ENCOUNTER — Encounter: Payer: Self-pay | Admitting: Oncology

## 2022-08-01 ENCOUNTER — Other Ambulatory Visit: Payer: Self-pay | Admitting: Oncology

## 2022-08-01 DIAGNOSIS — C9110 Chronic lymphocytic leukemia of B-cell type not having achieved remission: Secondary | ICD-10-CM

## 2022-08-01 NOTE — Progress Notes (Signed)
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  She did not require treatment until 2008 and has since been through multiple therapies.  She has been taking venetoclax 400 mg daily with good control of her disease for over 5 years.  Unfortunately her disease is now progressed with worsening thrombocytopenia and developing lymphadenopathy as well as worsening anemia.  She has now had a bone marrow which still shows CLL with a normal cellular bone marrow of 30% but 20% of the cellularity is her CLL.  Flow cytometry confirms at 78% of the lymphocytes are abnormal CD5/CD19 positive B-cell population.  We do not see evidence of transformation.  Cytogenetics are pending.   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  She will proceed with her monthly IVIG next week but I will cancel my follow-up appointment.  Thrombocytopenia Her platelet count has slowly declined over the last few months and was the lowest last week at 38,000, and now down to 26,000.  I think this is an indicator of progressive disease, but could also represent toxicities from the venetoclax.  We will therefore stop that.  She does have some recurring minor lymphadenopathy of the supraclavicular and cervical areas.  Worsening anemia. Her hemoglobin is down to 8.8 now with an MCV of 91.   Plan:  Her bone marrow still does show CLL comprising about 20% of her cellularity.  Her marrow is not hypercellular and we see no evidence of large cell transformation.  I feel her worsening cytopenias represent progression of CLL and we need to change her treatment.  I will stop the venetoclax to see  if her blood counts improve at all and I will check haptoglobin and reticulocyte count to see if she has hemolysis.  Her LDH is mildly elevated but this could be just from her CLL.  Cytogenetics are pending.  I will cancel her May appointment next week but she will proceed with her usual monthly IVIG.  I will see her back in 2 weeks with repeat CBC and CMP to see if her blood counts have improved at all after stopping the venetoclax.  Most likely we will try an alternative Bruton kinase inhibitor and we have 3 other options including acalabrutinib, Zanubrutinib and the newest one, pirtobrutinib.  She has been through numerous other treatments including fludarabine, chlorambucil, Bendamustine, ibrutinib and had a severe allergic reaction to rituximab.  The patient and her family understand the plans discussed today and are in agreement with them.  I have answered their questions.  She knows to contact our office if she develops concerns prior to her next appointment.  I provided 30 minutes of face-to-face time during this encounter and > 50% was spent counseling as documented under my assessment and plan.    [name], MD  [facility] [address] [place] [address] Dept: [phone] Dept Fax: [phone]   No orders of the defined types were placed in this encounter.     CHIEF COMPLAINT:  CC: CLL with combined immunodeficiency  Current Treatment: IVIG  HISTORY OF PRESENT ILLNESS:  Davon Seydel is a 77 y.o. female with chronic lymphocytic  leukemia originally diagnosed in May 2005.  She was on observation until November 2008. She was initially treated with chlorambucil and prednisone, as she refused intravenous chemotherapy.  She had a partial response to this regimen.  By January 2010, she had progressive disease with a white count over 200,000, with associated anemia, splenomegaly, thrombocytopenia, and  adenopathy.  She then received 5 cycles of fludarabine with good response.  She did well until January 2012, when she had progression once again.  She had a single dose of bendamustine and rituximab, which kept her disease under control for over a year.  However, she had a severe allergic reaction to the rituximab, so has not received further rituximab.  She was hospitalized after the bendamustine because of severe pancytopenia and required multiple transfusions, so was placed on observation.  In March 2013, she had progression of disease again, so was treated with bendamustine for 6 cycles, at a 50% dose reduction, and once again had a excellent response.  She was on observation until August 2014, then had progression of disease, so was placed on ibrutinib 420 mg daily.  The ibrutinib had kept her disease under fairly good control, but then she develops severe bilateral lower extremity cellulitis in April 2016, so ibrutinib was placed on hold.  She had persistent cellulitis, as well as  Clostridium difficile colitis in May 2016 requiring hospitalization.  She was readmitted soon after discharge with worsening cellulitis, requiring a prolonged hospitalization.  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 She eventually had resolution of the cellulitis   As she was largely asymptomatic, we continued observation until February 2017, at which time she had worsening splenomegaly.  Repeat CT imaging in February showed progressive disease, with marked splenomegaly and bulky lymphadenopathy.  She was then placed back on ibrutinib 420mg  daily.  The lymphocytosis, anemia and thrombocytopenia slowly improved.  Due to neutropenia, ibrutinib was placed on hold in December 2017.  The ibrutinib was resumed in January 2018, but discontinued in August 2018 due to toxicities, mainly severe abdominal muscle cramping.  She was hospitalized in early  September 2018 with urinary tract infection and pancytopenia.  She received multiple red blood cell and platelet transfusions during her stay.  She was admitted again in late September 2018 with perineal cellulitis extending to the lower abdomen and upper thigh, as well as persistent pancytopenia.  CT abdomen and pelvis revealed interval decrease in the abdominal lymphadenopathy, chronic massive splenomegaly with scattered small splenic infarcts, and small left pleural effusion with bibasilar atelectasis.  She required packed red blood cells while hospitalized.  She was discharged home with home health and [facility]  We continued to follow her closely, but did not place her on a treatment due to the persistent cellulitis.  She then was admitted in October 2018 with severe hypercalcemia, with a calcium of 15.  She was treated with IV fluids, zoledronic acid and Calcitonin injections.  We continued to monitor her closely.  She had recurrent hypercalcemia, for which she received IV fluids and zolendronic acid as an outpatient.     Due to worsening pancytopenia, she underwent bone marrow biopsy in January 2019.   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  The cellulitis had improved to a point we felt she could be treated again, so she was placed on venetoclax CLL in January 2019.  She  has continued on venetoclax 400 mg daily after ramp up dosing and has tolerated that fairly well, except for mild diarrhea.  Since starting venetoclax, she has had decreasing splenomegaly and improvement in her pancytopenia.  She initially continued to require IV fluids and zoledronic acid for the hypercalcemia.  Her last dose of zoledronic acid was given in March 2019.  In March, she had an irregular heart rhythm and EKG revealed occasional premature atrial complexes, as well as an incomplete right bundle branch block.  This was stable  compared to EKG done in October 2018.  Quantitative immunoglobulins done in August 2019 revealed a normal IgG, with low IgA and IgM.  She has continued IVIG monthly.  She had an episode of Campylobacter diarrhea in November 2019, which was treated with azithromycin for 7 days with resolution of her diarrhea. Venetoclax was held temporarily until her diarrhea resolved.  She has since tolerated venetoclax without significant difficulty.     In February 2022, she had a right diagnostic mammogram due to right breast subareolar tenderness, which did not reveal any evidence of malignancy.  CT abdomen in April did not reveal any acute abnormality or other findings to explain the left-sided abdominal pain.  There was resolution of previously seen gross splenomegaly.  There were prominent retroperitoneal lymph nodes, which had significantly diminished in size compared to prior exam.  A hiatal hernia was seen. There was descending colonic diverticulosis without evidence of acute diverticulitis.  Bilateral screening mammogram in May 2022 revealed possible asymmetry in the left breast.  Left diagnostic mammogram and ultrasound in June revealed a cluster of cysts/apocrine cyst in the left breast at 2 o'clock, 3 cm the nipple, measuring 6 x 3 x 6 mm, unchanged from the exam dated May 2021. There were no solid masses or suspicious lesions. She is up to date on colonoscopy with her last colonoscopy being in June 2022 with Dr. Jennye Boroughs.  This was negative, and repeat in 10 years was advised.    INTERVAL HISTORY:  Samantha Warren is here today for repeat clinical assessment for CLL with combined immunodeficiency after her bone marrow.  Her son and daughter are here with her today.  The patient states that she feels cramping and generalized aching intermittently for the past few days.  She complains of feeling weak.  As outlined above, she has been through numerous treatments including chlorambucil, fludarabine, Bendamustine, ibrutinib,  and now most recently venetoclax for over 5 years.  She had a prior severe allergic reaction to rituximab.  We performed a bone marrow due to mildly worsening anemia but severe worsening thrombocytopenia.  Her platelets were 50,000 and then came down to 44,000, 38,000 last time and now 26,000.  Her white count is 11,000 with 78.6% lymphocytes and ANC of 1760.  Hemoglobin is 8.8 with an MCV of 91.  I will check for hemolysis with a reticulocyte count and haptoglobin.  Her LDH is elevated but this could be from her CLL. Her bone marrow still shows CLL with a normocellular bone marrow of 30% but 20% of the cellularity is her CLL.  Flow cytometry confirms at 78% of the lymphocytes are abnormal CD5/CD19 positive B-cell population.  We do not see evidence of transformation.  Cytogenetics are pending.  We will stop the venetoclax. I will cancel her May appointment next week but she will proceed with her usual monthly IVIG.  I will see her back in 2 weeks with repeat CBC and CMP to see if her blood counts  have improved at all after stopping the venetoclax.  Most likely we will try an alternative Bruton kinase inhibitor and we have 3 other options including acalabrutinib, Zanubrutinib and the newest one, pirtobrutinib.  She denies signs of infection such as sore throat, sinus drainage, cough, or urinary symptoms.  She denies hematuria or discoloration of her urine.  She denies fevers or recurrent chills. She denies pain. She denies nausea, vomiting, chest pain, dyspnea or cough. Her appetite is good and her weight has decreased 3 pounds over last 2 weeks .  REVIEW OF SYSTEMS:  Review of Systems  Constitutional: Negative.  Negative for appetite change, chills, diaphoresis, fatigue, fever and unexpected weight change.  HENT:  Negative.  Negative for hearing loss, lump/mass, mouth sores, nosebleeds, sore throat, tinnitus, trouble swallowing and voice change.   Eyes: Negative.  Negative for eye problems and icterus.   Respiratory: Negative.  Negative for chest tightness, cough, hemoptysis, shortness of breath and wheezing.   Cardiovascular: Negative.  Negative for chest pain, leg swelling and palpitations.  Gastrointestinal: Negative.  Negative for abdominal distention, abdominal pain, blood in stool, constipation, diarrhea, nausea, rectal pain and vomiting.  Endocrine: Negative.  Negative for hot flashes.  Genitourinary: Negative.  Negative for bladder incontinence, difficulty urinating, dyspareunia, dysuria, frequency, hematuria, menstrual problem, nocturia, pelvic pain, vaginal bleeding and vaginal discharge.   Musculoskeletal:  Positive for arthralgias (in both knees) and myalgias. Negative for back pain, flank pain, gait problem, neck pain and neck stiffness.  Skin: Negative.  Negative for itching, rash and wound.  Neurological:  Negative for dizziness, extremity weakness, gait problem, headaches, light-headedness, numbness, seizures and speech difficulty.  Hematological: Negative.  Negative for adenopathy. Does not bruise/bleed easily.  Psychiatric/Behavioral: Negative.  Negative for confusion, decreased concentration, depression, sleep disturbance and suicidal ideas. The patient is not nervous/anxious.      VITALS:  Blood pressure (!) 130/59, pulse 100, temperature 99.1 F (37.3 C), temperature source Oral, resp. rate 18, height 5' 1.5" (1.562 m), weight 217 lb 9.6 oz (98.7 kg), SpO2 99 %.  Wt Readings from Last 3 Encounters:  08/07/22 214 lb (97.1 kg)  08/02/22 217 lb 9.6 oz (98.7 kg)  07/24/22 220 lb 6.4 oz (100 kg)    Body mass index is 40.45 kg/m.  Performance status (ECOG): 1 - Symptomatic but completely ambulatory  PHYSICAL EXAM:  Physical Exam Vitals and nursing note reviewed.  Constitutional:      General: She is not in acute distress.    Appearance: Normal appearance. She is normal weight. She is not ill-appearing, toxic-appearing or diaphoretic.  HENT:     Head: Normocephalic and  atraumatic.     Right Ear: Tympanic membrane, ear canal and external ear normal. There is no impacted cerumen.     Left Ear: Tympanic membrane and external ear normal. There is no impacted cerumen.     Nose: Nose normal. No congestion or rhinorrhea.     Mouth/Throat:     Mouth: Mucous membranes are moist.     Pharynx: Oropharynx is clear. No oropharyngeal exudate or posterior oropharyngeal erythema.  Eyes:     General: No scleral icterus.       Right eye: No discharge.        Left eye: No discharge.     Extraocular Movements: Extraocular movements intact.     Conjunctiva/sclera: Conjunctivae normal.     Pupils: Pupils are equal, round, and reactive to light.  Neck:     Vascular: No carotid bruit.  Comments: A swelling I the submandibular area measuring 2-3 cm, firm, non tender.  Cardiovascular:     Rate and Rhythm: Normal rate and regular rhythm.     Pulses: Normal pulses.     Heart sounds: Normal heart sounds. No murmur heard.    No friction rub. No gallop.  Pulmonary:     Effort: Pulmonary effort is normal. No respiratory distress.     Breath sounds: Normal breath sounds. No stridor. No wheezing, rhonchi or rales.  Chest:     Chest wall: No tenderness.  Abdominal:     General: Bowel sounds are normal. There is no distension.     Palpations: Abdomen is soft. There is no hepatomegaly, splenomegaly or mass.     Tenderness: There is no abdominal tenderness. There is no right CVA tenderness, left CVA tenderness, guarding or rebound.     Hernia: No hernia is present.  Musculoskeletal:        General: No swelling, tenderness, deformity or signs of injury. Normal range of motion.     Cervical back: Normal range of motion and neck supple. No rigidity or tenderness.     Right lower leg: No edema.     Left lower leg: No edema.  Lymphadenopathy:     Cervical: No cervical adenopathy.     Right cervical: No superficial, deep or posterior cervical adenopathy.    Left cervical: No  superficial, deep or posterior cervical adenopathy.     Upper Body:     Right upper body: No supraclavicular, axillary or pectoral adenopathy.     Left upper body: No supraclavicular, axillary or pectoral adenopathy.     Lower Body: No right inguinal adenopathy. No left inguinal adenopathy.  Skin:    General: Skin is warm and dry.     Coloration: Skin is not jaundiced or pale.     Findings: No bruising, erythema, lesion or rash.  Neurological:     General: No focal deficit present.     Mental Status: She is alert and oriented to person, place, and time. Mental status is at baseline.     Cranial Nerves: No cranial nerve deficit.     Sensory: No sensory deficit.     Motor: No weakness.     Coordination: Coordination normal.     Gait: Gait normal.     Deep Tendon Reflexes: Reflexes normal.  Psychiatric:        Mood and Affect: Mood normal.        Behavior: Behavior normal.        Thought Content: Thought content normal.        Judgment: Judgment normal.   LABS:      Latest Ref Rng & Units 08/07/2022   12:00 AM 08/02/2022   12:00 AM 07/18/2022   12:00 AM  CBC  WBC  15.8     11.0     11.5      Hemoglobin 12.0 - 16.0 8.5     8.8     10.3      Hematocrit 36 - 46 26     27     31       Platelets 150 - 400 K/uL 28     26     37         This result is from an external source.      Latest Ref Rng & Units 08/07/2022   12:00 AM 08/02/2022   12:00 AM 07/18/2022    8:21 AM  CMP  Glucose 70 - 99 mg/dL   119   BUN 4 - 21 16     18     19    Creatinine 0.5 - 1.1 1.2     0.8     1.08   Sodium 137 - 147 134     142     139   Potassium 3.5 - 5.1 mEq/L 3.9     3.7     3.5   Chloride 99 - 108 102     111     107   CO2 13 - 22 25     23     22    Calcium 8.7 - 10.7 8.9     9.1     8.5   Total Protein 6.5 - 8.1 g/dL   7.5   Total Bilirubin 0.3 - 1.2 mg/dL   0.8   Alkaline Phos 25 - 125 119     99     66   AST 13 - 35 49     75     25   ALT 7 - 35 U/L 14     16     15       This result is from an  external source.   No results found for: "CEA1", "CEA" / No results found for: "CEA1", "CEA" No results found for: "PSA1" No results found for: "JYN829" No results found for: "CAN125"  No results found for: "TOTALPROTELP", "ALBUMINELP", "A1GS", "A2GS", "BETS", "BETA2SER", "GAMS", "MSPIKE", "SPEI" Lab Results  Component Value Date   TIBC 321 07/09/2022   FERRITIN 790 (H) 07/09/2022   IRONPCTSAT 27 07/09/2022   Lab Results  Component Value Date   LDH 378 (H) 07/09/2022    STUDIES:  No results found.    HISTORY:   Past Medical History:  Diagnosis Date   Cancer [facility])     Past Surgical History:  Procedure Laterality Date   arm surgery     GALLBLADDER SURGERY     TOTAL KNEE ARTHROPLASTY      Family History  Problem Relation Age of Onset   Cancer Mother    Hypertension Father    Hypertension Sister    Stroke Sister    Hypertension Brother    Stroke Brother     Social History:  reports that she has been smoking cigarettes. She has a 15.00 pack-year smoking history. She has never used smokeless tobacco. She reports current drug use. No history on file for alcohol use.The patient is alone today.  Allergies:  Allergies  Allergen Reactions   Metronidazole     Other reaction(s): Other (See Comments)   Rituximab     Other reaction(s): Other (See Comments)   Sulfa Antibiotics Rash    Current Medications: Current Outpatient Medications  Medication Sig Dispense Refill   alendronate (FOSAMAX) 70 MG tablet      amLODipine (NORVASC) 2.5 MG tablet Take 2.5 mg by mouth daily.     Ascorbic Acid (VITAMIN C WITH ROSE HIPS) 500 MG tablet Take 500 mg by mouth daily.     azithromycin (ZITHROMAX) 500 MG tablet Take 1 tablet (500 mg total) by mouth daily. 7 tablet 0   benzonatate (TESSALON) 100 MG capsule TAKE ONE CAPSULE BY MOUTH 3 TIMES A DAY AS NEEDED 60 capsule 5   calcium carbonate (OSCAL) 1500 (600 Ca) MG TABS tablet Take by mouth.     Cholecalciferol (VITAMIN D3) 125 MCG  (5000 UT) TABS Take 1 tablet by  mouth daily.     diclofenac Sodium (VOLTAREN) 1 % GEL Apply topically.     diphenoxylate-atropine (LOMOTIL) 2.5-0.025 MG tablet Take 1-2 tablets by mouth every 6 (six) hours.     ibuprofen (ADVIL) 800 MG tablet      KLOR-CON M20 20 MEQ tablet TAKE 1 TABLET BY MOUTH 3 TIMES DAILY. (Patient taking differently: Take 20 mEq by mouth 3 (three) times daily. Patient voiced currently taking once daily provider aware) 270 tablet 1   lidocaine (LIDODERM) 5 % 2 patches daily.     lidocaine-prilocaine (EMLA) cream Apply 1 Application topically as needed. 30 g 5   Multiple Vitamin (MULTIVITAMIN WITH MINERALS) TABS tablet Take 1 tablet by mouth daily.     Omega-3 Fatty Acids (FISH OIL) 1000 MG CAPS Take 1 capsule by mouth daily.     omeprazole (PRILOSEC) 20 MG capsule TAKE 1 CAPSULE BY MOUTH EVERY DAY 90 capsule 3   oxyCODONE (ROXICODONE) 15 MG immediate release tablet Take 1 tablet (15 mg total) by mouth every 6 (six) hours as needed. 100 tablet 0   prochlorperazine (COMPAZINE) 10 MG tablet TAKE 1 TABLET BY MOUTH EVERY 6 HOURS AS NEEDED 90 tablet 1   vitamin B-12 (CYANOCOBALAMIN) 100 MCG tablet Take 100 mcg by mouth daily.     Current Facility-Administered Medications  Medication Dose Route Frequency Provider Last Rate Last Admin   sodium chloride flush (NS) 0.9 % injection 10 mL  10 mL Intracatheter PRN [name], MD   10 mL at 08/02/22 1340   Facility-Administered Medications Ordered in Other Visits  Medication Dose Route Frequency Provider Last Rate Last Admin   influenza vac split quadrivalent PF (FLUARIX) injection 0.5 mL  0.5 mL Intramuscular Once [name], MD       sodium chloride flush (NS) 0.9 % injection 10 mL  10 mL Intracatheter PRN Belva Crome A, PA-C   10 mL at 09/26/20 1610    I,Jasmine M Lassiter,acting as a scribe for [name], MD.,have documented all relevant documentation on the behalf of [name], MD,as  directed by  [name], MD while in the presence of [name], MD.

## 2022-08-02 ENCOUNTER — Other Ambulatory Visit: Payer: Self-pay

## 2022-08-02 ENCOUNTER — Inpatient Hospital Stay: Payer: Medicare Other | Attending: Hematology and Oncology | Admitting: Oncology

## 2022-08-02 ENCOUNTER — Telehealth: Payer: Self-pay

## 2022-08-02 ENCOUNTER — Encounter: Payer: Self-pay | Admitting: Oncology

## 2022-08-02 ENCOUNTER — Other Ambulatory Visit: Payer: Self-pay | Admitting: Oncology

## 2022-08-02 ENCOUNTER — Inpatient Hospital Stay: Payer: Medicare Other

## 2022-08-02 VITALS — BP 130/59 | HR 100 | Temp 99.1°F | Resp 18 | Ht 61.5 in | Wt 217.6 lb

## 2022-08-02 DIAGNOSIS — Z23 Encounter for immunization: Secondary | ICD-10-CM | POA: Diagnosis not present

## 2022-08-02 DIAGNOSIS — D61818 Other pancytopenia: Secondary | ICD-10-CM

## 2022-08-02 DIAGNOSIS — D801 Nonfamilial hypogammaglobulinemia: Secondary | ICD-10-CM

## 2022-08-02 DIAGNOSIS — C9110 Chronic lymphocytic leukemia of B-cell type not having achieved remission: Secondary | ICD-10-CM | POA: Diagnosis not present

## 2022-08-02 DIAGNOSIS — D819 Combined immunodeficiency, unspecified: Secondary | ICD-10-CM | POA: Insufficient documentation

## 2022-08-02 DIAGNOSIS — Z79899 Other long term (current) drug therapy: Secondary | ICD-10-CM | POA: Diagnosis not present

## 2022-08-02 LAB — RETICULOCYTES
Immature Retic Fract: 26.4 % — ABNORMAL HIGH (ref 2.3–15.9)
RBC.: 2.88 MIL/uL — ABNORMAL LOW (ref 3.87–5.11)
Retic Count, Absolute: 68.5 10*3/uL (ref 19.0–186.0)
Retic Ct Pct: 2.4 % (ref 0.4–3.1)

## 2022-08-02 LAB — BASIC METABOLIC PANEL
BUN: 18 (ref 4–21)
CO2: 23 — AB (ref 13–22)
Chloride: 111 — AB (ref 99–108)
Creatinine: 0.8 (ref 0.5–1.1)
Glucose: 143
Potassium: 3.7 mEq/L (ref 3.5–5.1)
Sodium: 142 (ref 137–147)

## 2022-08-02 LAB — HEPATIC FUNCTION PANEL
ALT: 16 U/L (ref 7–35)
AST: 75 — AB (ref 13–35)
Alkaline Phosphatase: 99 (ref 25–125)
Bilirubin, Total: 0.8

## 2022-08-02 LAB — COMPREHENSIVE METABOLIC PANEL
Albumin: 3.9 (ref 3.5–5.0)
Calcium: 9.1 (ref 8.7–10.7)

## 2022-08-02 LAB — CBC AND DIFFERENTIAL
HCT: 27 — AB (ref 36–46)
Hemoglobin: 8.8 — AB (ref 12.0–16.0)
Neutrophils Absolute: 1.87
Platelets: 26 10*3/uL — AB (ref 150–400)
WBC: 11

## 2022-08-02 LAB — CBC: RBC: 2.96 — AB (ref 3.87–5.11)

## 2022-08-02 MED ORDER — SODIUM CHLORIDE 0.9% FLUSH
10.0000 mL | INTRAVENOUS | Status: DC | PRN
  Administered 2022-08-02: 10 mL

## 2022-08-02 MED ORDER — HEPARIN SOD (PORK) LOCK FLUSH 100 UNIT/ML IV SOLN
500.0000 [IU] | Freq: Once | INTRAVENOUS | Status: AC | PRN
  Administered 2022-08-02: 500 [IU]

## 2022-08-02 MED ORDER — OXYCODONE HCL 15 MG PO TABS
15.0000 mg | ORAL_TABLET | Freq: Four times a day (QID) | ORAL | 0 refills | Status: DC | PRN
Start: 2022-08-02 — End: 2022-09-02

## 2022-08-02 NOTE — Telephone Encounter (Signed)
Informed patient in person 

## 2022-08-02 NOTE — Telephone Encounter (Signed)
-----   Message from [name], MD sent at 08/01/2022  6:37 PM EDT ----- Regarding: lab appt Can we add lab appt 5/3? Then could cancel labs and my appt for 5/8 and she could just get her IVIG that day

## 2022-08-02 NOTE — Telephone Encounter (Signed)
-----   Message from [name], MD sent at 07/30/2022  7:24 PM EDT ----- Regarding: call Tell her the bone marrow does not look bad, we will discuss at appt

## 2022-08-02 NOTE — Telephone Encounter (Signed)
CRITICAL VALUE STICKER  CRITICAL VALUE:Platelet 26  RECEIVER (on-site recipient of call):Marylene Land, LPN  DATE & TIME NOTIFIED:08/02/22 @ 1432  MESSENGER (representative from lab):Angelique Blonder at [facility] hematology lab   MD NOTIFIED: DR. Gilman Buttner   TIME OF NOTIFICATION: 1435  RESPONSE: In room with patient at this time/

## 2022-08-04 LAB — HAPTOGLOBIN: Haptoglobin: 143 mg/dL (ref 42–346)

## 2022-08-05 ENCOUNTER — Encounter (HOSPITAL_COMMUNITY): Payer: Self-pay | Admitting: Oncology

## 2022-08-06 ENCOUNTER — Encounter: Payer: Self-pay | Admitting: Oncology

## 2022-08-06 MED FILL — Immune Globulin (Human) IV Soln 10 GM/100ML: INTRAVENOUS | Qty: 450 | Status: AC

## 2022-08-07 ENCOUNTER — Inpatient Hospital Stay (INDEPENDENT_AMBULATORY_CARE_PROVIDER_SITE_OTHER): Payer: Medicare Other | Admitting: Hematology and Oncology

## 2022-08-07 ENCOUNTER — Other Ambulatory Visit: Payer: Self-pay | Admitting: Hematology and Oncology

## 2022-08-07 ENCOUNTER — Ambulatory Visit: Payer: Medicare Other | Admitting: Oncology

## 2022-08-07 ENCOUNTER — Telehealth: Payer: Self-pay | Admitting: Pharmacy Technician

## 2022-08-07 ENCOUNTER — Other Ambulatory Visit (HOSPITAL_COMMUNITY): Payer: Self-pay

## 2022-08-07 ENCOUNTER — Inpatient Hospital Stay: Payer: Medicare Other

## 2022-08-07 ENCOUNTER — Other Ambulatory Visit: Payer: Self-pay

## 2022-08-07 ENCOUNTER — Encounter: Payer: Self-pay | Admitting: Hematology and Oncology

## 2022-08-07 ENCOUNTER — Encounter: Payer: Self-pay | Admitting: Oncology

## 2022-08-07 VITALS — HR 138

## 2022-08-07 VITALS — BP 123/55 | HR 122 | Temp 100.3°F | Resp 22 | Wt 214.0 lb

## 2022-08-07 DIAGNOSIS — C9110 Chronic lymphocytic leukemia of B-cell type not having achieved remission: Secondary | ICD-10-CM

## 2022-08-07 DIAGNOSIS — R509 Fever, unspecified: Secondary | ICD-10-CM

## 2022-08-07 DIAGNOSIS — D801 Nonfamilial hypogammaglobulinemia: Secondary | ICD-10-CM | POA: Diagnosis not present

## 2022-08-07 DIAGNOSIS — R0602 Shortness of breath: Secondary | ICD-10-CM | POA: Diagnosis not present

## 2022-08-07 DIAGNOSIS — R0600 Dyspnea, unspecified: Secondary | ICD-10-CM

## 2022-08-07 DIAGNOSIS — D819 Combined immunodeficiency, unspecified: Secondary | ICD-10-CM

## 2022-08-07 LAB — HEPATIC FUNCTION PANEL
ALT: 14 U/L (ref 7–35)
AST: 49 — AB (ref 13–35)
Alkaline Phosphatase: 119 (ref 25–125)
Bilirubin, Total: 1

## 2022-08-07 LAB — COMPREHENSIVE METABOLIC PANEL
Albumin: 4.1 (ref 3.5–5.0)
Calcium: 8.9 (ref 8.7–10.7)
EGFR: 53

## 2022-08-07 LAB — BASIC METABOLIC PANEL
BUN: 16 (ref 4–21)
CO2: 25 — AB (ref 13–22)
Chloride: 102 (ref 99–108)
Creatinine: 1.2 — AB (ref 0.5–1.1)
Glucose: 130
Potassium: 3.9 mEq/L (ref 3.5–5.1)
Sodium: 134 — AB (ref 137–147)

## 2022-08-07 LAB — CBC AND DIFFERENTIAL
HCT: 26 — AB (ref 36–46)
Hemoglobin: 8.5 — AB (ref 12.0–16.0)
Neutrophils Absolute: 2.84
Platelets: 28 10*3/uL — AB (ref 150–400)
WBC: 15.8

## 2022-08-07 LAB — CBC: RBC: 2.93 — AB (ref 3.87–5.11)

## 2022-08-07 MED ORDER — SODIUM CHLORIDE 0.9 % IV SOLN: Freq: Once | INTRAVENOUS | Status: AC

## 2022-08-07 MED ORDER — DIPHENHYDRAMINE HCL 50 MG/ML IJ SOLN: 25.0000 mg | Freq: Once | INTRAMUSCULAR | Status: DC

## 2022-08-07 MED ORDER — HEPARIN SOD (PORK) LOCK FLUSH 100 UNIT/ML IV SOLN: 500.0000 [IU] | Freq: Once | INTRAVENOUS | Status: DC | PRN

## 2022-08-07 MED ORDER — AZITHROMYCIN 500 MG PO TABS: 500.0000 mg | ORAL_TABLET | Freq: Every day | ORAL | 0 refills | Status: DC

## 2022-08-07 MED ORDER — IMMUNE GLOBULIN (HUMAN) 10 GM/100ML IV SOLN
0.9000 g/kg | Freq: Once | INTRAVENOUS | Status: DC
  Filled 2022-08-07: qty 450

## 2022-08-07 MED ORDER — ACETAMINOPHEN 325 MG PO TABS: 650.0000 mg | ORAL_TABLET | Freq: Once | ORAL | Status: DC

## 2022-08-07 MED ORDER — DEXTROSE 5 % IV SOLN: Freq: Once | INTRAVENOUS | Status: DC

## 2022-08-07 MED FILL — Immune Globulin (Human) IV Soln 10 GM/100ML: INTRAVENOUS | Qty: 450 | Status: AC

## 2022-08-07 NOTE — Progress Notes (Signed)
CRITICAL VALUE STICKER  CRITICAL VALUE: Plt 28, Hgb 8.5  RECEIVER (on-site recipient of call):  Dyane Dustman RN  DATE & TIME NOTIFIED:   08/07/2022 @  1052  MESSENGER (representative from lab):  Morrie Sheldon [facility] lab  MD NOTIFIED:   Sigurd Sos., PA  TIME OF NOTIFICATION:    1055  RESPONSE:   Patient is in clinic appt with Tracey Harries., PA

## 2022-08-07 NOTE — Telephone Encounter (Signed)
Oral Oncology Patient Advocate Encounter   Was successful in securing patient a $ 4,500 grant from [employer] to provide copayment coverage for Jaypirca.  This will keep the out of pocket expense at $0.    The billing information is as follows and has been shared with [employer].   Member ID: [id number] Group ID: [id number] RxBin: [id number] Dates of Eligibility: 08/07/22 through 08/07/23  Fund:  CLL  Jinger Neighbors, CPhT-Adv Oncology Pharmacy Patient Advocate [facility] Direct Number: [phone]  Fax: [phone]

## 2022-08-07 NOTE — Assessment & Plan Note (Addendum)
Increased shortness of breath with exertion.  She is not having chest pain.  CTA chest was negative for pulmonary embolism there were focal patchy areas of infiltration bilaterally that could represent areas of atelectasis or early multifocal pneumonia.  She will start azithromycin today.

## 2022-08-07 NOTE — Telephone Encounter (Signed)
Oral Oncology Patient Advocate Encounter  After completing a benefits investigation, prior authorization for Joyice Faster is not required at this time through [employer].  Patient's copay is $3,263.33.     Jinger Neighbors, CPhT-Adv Oncology Pharmacy Patient Advocate [facility] Direct Number: [phone]  Fax: [phone]

## 2022-08-07 NOTE — Assessment & Plan Note (Addendum)
The patient has been doing poorly and presents to the infusion center with a temperature of 100.3.  She is clinically dehydrated, so started on IV fluids.  Respiratory viral panel is negative.  The patient has not been able to give [name] a urine specimen.  CTA chest revealed bilateral focal areas of infiltration which were nonspecific and could represent areas of atelectasis or early multifocal pneumonia.  We will treat her with azithromycin 500 mg daily for 7 days.

## 2022-08-07 NOTE — Assessment & Plan Note (Addendum)
Chronic lymphocytic leukemia since 2005.  She had been on venetoclax 400 mg daily since 2019.  She developed worsening anemia and thrombocytopenia over the past couple of months.  We were concerned about progression.  Bone marrow was consistent with CLL without transformation.  Venetoclax was placed on hold on May 3 in hopes her counts would improve.  Her white blood cell count is higher than previous.  Hemoglobin slightly lower and platelets stable.  She mild cervical and supraclavicular adenopathy, but no hepatosplenomegaly on examination.  Lymphadenopathy was seen in the base of the neck, supraclavicular areas, anterior mediastinum retro-crural region and upper abdomen.  There was splenomegaly with nodular contour likely representing lymphoma involvement.  She will remain off venetoclax.  We will plan to see her back in 1 week as previously scheduled with a CBC and comprehensive metabolic panel to discuss alternative treatment.

## 2022-08-07 NOTE — Assessment & Plan Note (Signed)
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  She will receive IVIG as an outpatient tomorrow.

## 2022-08-07 NOTE — Progress Notes (Signed)
[facility]  [address] [place],  [place]  [address] [phone]  Clinic Day:  08/07/2022  Referring physician: [name], MD  ASSESSMENT & PLAN:   Assessment & Plan: Fever The patient has been doing poorly and presents to the infusion center with a temperature of 100.3.  She is clinically dehydrated, so started on IV fluids.  Respiratory viral panel is negative.  The patient has not been able to give [name] a urine specimen.  CTA chest revealed bilateral focal areas of infiltration which were nonspecific and could represent areas of atelectasis or early multifocal pneumonia.  We will treat her with azithromycin 500 mg daily for 7 days.  Shortness of breath Increased shortness of breath with exertion.  She is not having chest pain.  CTA chest was negative for pulmonary embolism there were focal patchy areas of infiltration bilaterally that could represent areas of atelectasis or early multifocal pneumonia.  She will start azithromycin today.  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  She had been on venetoclax 400 mg daily since 2019.  She developed worsening anemia and thrombocytopenia over the past couple of months.  We were concerned about progression.  Bone marrow was consistent with CLL without transformation.  Venetoclax was placed on hold on May 3 in hopes her counts would improve.  Her white blood cell count is higher than previous.  Hemoglobin slightly lower and platelets stable.  She mild cervical and supraclavicular adenopathy, but no hepatosplenomegaly on examination.  Lymphadenopathy was seen in the base of the neck, supraclavicular areas, anterior mediastinum retro-crural region and upper abdomen.  There was splenomegaly with nodular contour likely representing lymphoma involvement.  She will remain off venetoclax.  We will plan to see her back in 1 week as previously  scheduled with a CBC and comprehensive metabolic panel to discuss alternative treatment.  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  She will receive IVIG as an outpatient tomorrow.    The patient understands the plans discussed today and is in agreement with them.  She knows to contact our office if she develops concerns prior to her next appointment.   I provided 40 minutes of face-to-face time during this encounter and > 50% was spent counseling as documented under my assessment and plan.    [name], PA-C  [facility] [address] [place] [address] Dept: [phone] Dept Fax: [phone]   No orders of the defined types were placed in this encounter.     CHIEF COMPLAINT:  CC: Chronic lymphocytic leukemia  Current Treatment: On hold  HISTORY OF PRESENT ILLNESS:  Samantha Warren is a 77 y.o. 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  She was on observation until November 2008. She was initially treated with chlorambucil and prednisone, as she refused intravenous chemotherapy.  She had a partial response to this regimen.  By January 2010, she had progressive disease with a white count over 200,000, with associated anemia, splenomegaly, thrombocytopenia, and adenopathy.  She then received 5 cycles of fludarabine with good response.  She did well until January 2012, when she had progression once again.  She had a single dose of bendamustine and rituximab, which kept her disease under control for over a year.  However, she had a severe allergic reaction to the  rituximab, so has not received further rituximab.  She was hospitalized after the bendamustine because of severe pancytopenia and required multiple transfusions, so was placed on observation.  In March 2013, she had progression of  disease again, so was treated with bendamustine for 6 cycles, at a 50% dose reduction, and once again had a excellent response.  She was on observation until August 2014, then had progression of disease, so was placed on ibrutinib 420 mg daily.  The ibrutinib had kept her disease under fairly good control, but then she develops severe bilateral lower extremity cellulitis in April 2016, so ibrutinib was placed on hold.  She had persistent cellulitis, as well as  Clostridium difficile colitis in May 2016 requiring hospitalization.  She was readmitted soon after discharge with worsening cellulitis, requiring a prolonged hospitalization.  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 She eventually had resolution of the cellulitis   As she was largely asymptomatic, we continued observation until February 2017, at which time she had worsening splenomegaly.  Repeat CT imaging in February showed progressive disease, with marked splenomegaly and bulky lymphadenopathy.  She was then placed back on ibrutinib 420mg  daily.  The lymphocytosis, anemia and thrombocytopenia slowly improved.  Due to neutropenia, ibrutinib was placed on hold in December 2017.  The ibrutinib was resumed in January 2018, but discontinued in August 2018 due to toxicities, mainly severe abdominal muscle cramping.  She was hospitalized in early September 2018 with urinary tract infection and pancytopenia.  She received multiple red blood cell and platelet transfusions during her stay.  She was admitted again in late September 2018 with perineal cellulitis extending to the lower abdomen and upper thigh, as well as persistent pancytopenia.  CT abdomen and pelvis revealed interval decrease in the abdominal lymphadenopathy, chronic massive splenomegaly with scattered small splenic infarcts, and small left pleural effusion with bibasilar atelectasis.  She required packed red  blood cells while hospitalized.  She was discharged home with home health and [facility].  We continued to follow her closely, but did not place her on a treatment due to the persistent cellulitis.  She then was admitted in October 2018 with severe hypercalcemia, with a calcium of 15.  She was treated with IV fluids, zoledronic acid and Calcitonin injections.  We continued to monitor her closely.  She had recurrent hypercalcemia, for which she received IV fluids and zolendronic acid as an outpatient.     Due to worsening pancytopenia, she underwent bone marrow biopsy in January 2019.   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  The cellulitis had improved to a point we felt she could be treated again, so she was placed on venetoclax CLL in January 2019.  She has continued on venetoclax 400 mg daily after ramp up dosing and has tolerated that fairly well, except for mild diarrhea.  Since starting venetoclax, she has had decreasing splenomegaly and improvement in her pancytopenia.  She initially continued to require IV fluids and zoledronic acid for the hypercalcemia.  Her last dose of zoledronic acid was given in March 2019.  In March, she had an irregular heart rhythm and EKG revealed occasional premature atrial complexes, as well as an incomplete right bundle branch block.  This was stable compared to EKG done in October 2018.  Quantitative immunoglobulins done in August 2019 revealed a normal IgG, with low IgA  and IgM.  She has continued IVIG monthly.  She had an episode of Campylobacter diarrhea in November 2019, which was treated with azithromycin for 7 days with resolution of her diarrhea. Venetoclax was held temporarily until her diarrhea resolved.  She has since tolerated venetoclax without significant difficulty.     In February 2022, she had a right diagnostic mammogram due to right breast subareolar  tenderness, which did not reveal any evidence of malignancy.  CT abdomen in April did not reveal any acute abnormality or other findings to explain the left-sided abdominal pain.  There was resolution of previously seen gross splenomegaly.  There were prominent retroperitoneal lymph nodes, which had significantly diminished in size compared to prior exam.  A hiatal hernia was seen. There was descending colonic diverticulosis without evidence of acute diverticulitis.  Bilateral screening mammogram in May 2022 revealed possible asymmetry in the left breast.  Left diagnostic mammogram and ultrasound in June revealed a cluster of cysts/apocrine cyst in the left breast at 2 o'clock, 3 cm the nipple, measuring 6 x 3 x 6 mm, unchanged from the exam dated May 2021. There were no solid masses or suspicious lesions. She is up to date on colonoscopy with her last colonoscopy being in June 2022 with Dr. Jennye Boroughs.  This was negative, and repeat in 10 years was advised.   She had worsening anemia and thrombocytopenia, felt to be most likely due to progressive chronic lymphocytic leukemia.  Bone marrow biopsy was performed in interventional radiology on April 25.  Pathology was consistent with CLL, without transformation.  Venetoclax was discontinued last week in hopes that her cytopenias may be in part due to this.  INTERVAL HISTORY:  Samantha Warren is added to the schedule today as she presented to the infusion center for her IVIG with report of weakness, shortness of breath and bodyaches.  She had a temperature of 100.3 so was sent to our office for evaluation.  She states she has been feeling poorly for a couple of weeks.  She had diarrhea last month and stool studies were negative.  She states this has resolved and she has been mildly constipated.  She denies fevers or chills at home.  She reports a intermittent nonproductive cough.  She denies sore throat. She reports stable knee pain. Her appetite is decreased. Her weight  has decreased 3 pounds over last week .  Unfortunately, she continues to smoke about half a pack per day.  REVIEW OF SYSTEMS:  Review of Systems  Constitutional:  Positive for fatigue. Negative for appetite change, chills, fever and unexpected weight change.  HENT:   Negative for lump/mass, mouth sores and sore throat.   Respiratory:  Positive for shortness of breath. Negative for cough.   Cardiovascular:  Negative for chest pain and leg swelling.  Gastrointestinal:  Negative for abdominal pain, constipation, diarrhea, nausea and vomiting.  Endocrine: Negative for hot flashes.  Genitourinary:  Negative for difficulty urinating, dysuria, frequency and hematuria.   Musculoskeletal:  Negative for arthralgias, back pain and myalgias.  Skin:  Negative for rash.  Neurological:  Negative for dizziness and headaches.  Hematological:  Positive for adenopathy. Does not bruise/bleed easily.  Psychiatric/Behavioral:  Negative for depression and sleep disturbance. The patient is not nervous/anxious.      VITALS:  Pulse (!) 138, SpO2 94 %.   08/07/2022  BASIC VITALS   BP 123/55 !   BP 130/61   Site Left Arm   Site Left Arm   Patient Position Standing  Patient Position Sitting   Pulse Rate 138 !   Pulse Rate 95   Pulse Rate 122 !   Pulse Rate 126 !   Resp 22 !   Temp 100.3 F (37.9 C)   Height   Weight 214 lbs   BMI (Calculated) 39.79   ORTHOSTATICS   Orthostatic BP   Orthostatic Pulse   Patient Position Standing   Patient Position Sitting   OXIMETRY   SpO2 94 %   SpO2 98 %   SpO2 98 %   SpO2 99 %     Legend: ! Abnormal Wt Readings from Last 3 Encounters:  08/07/22 214 lb (97.1 kg)  08/02/22 217 lb 9.6 oz (98.7 kg)  07/24/22 220 lb 6.4 oz (100 kg)    There is no height or weight on file to calculate BMI.  Performance status (ECOG): 2 - Symptomatic, <50% confined to bed  PHYSICAL EXAM:  Physical Exam Vitals and nursing note reviewed.  Constitutional:      General: She  is not in acute distress.    Appearance: Normal appearance.  HENT:     Head: Normocephalic and atraumatic.     Mouth/Throat:     Mouth: Mucous membranes are moist.     Pharynx: Oropharynx is clear. No oropharyngeal exudate or posterior oropharyngeal erythema.  Eyes:     General: No scleral icterus.    Extraocular Movements: Extraocular movements intact.     Conjunctiva/sclera: Conjunctivae normal.     Pupils: Pupils are equal, round, and reactive to light.  Cardiovascular:     Rate and Rhythm: Normal rate and regular rhythm.     Heart sounds: Normal heart sounds. No murmur heard.    No friction rub. No gallop.  Pulmonary:     Effort: Pulmonary effort is normal.     Breath sounds: Normal breath sounds. No wheezing, rhonchi or rales.  Abdominal:     General: There is no distension.     Palpations: Abdomen is soft. There is no mass.     Tenderness: There is no abdominal tenderness.  Musculoskeletal:        General: Normal range of motion.     Cervical back: Normal range of motion and neck supple. No tenderness.     Right lower leg: No edema.     Left lower leg: No edema.  Lymphadenopathy:     Cervical: Cervical adenopathy (Subcentimeter bilateral) present.     Upper Body:     Right upper body: Supraclavicular adenopathy (Subcentimeter) present. No axillary adenopathy.     Left upper body: Supraclavicular adenopathy (2 small lymph nodes, largest measuring about 1 cm) present. No axillary adenopathy.  Skin:    General: Skin is warm and dry.     Coloration: Skin is not jaundiced.     Findings: No rash.  Neurological:     Mental Status: She is alert and oriented to person, place, and time.     Cranial Nerves: No cranial nerve deficit.  Psychiatric:        Mood and Affect: Mood normal.        Behavior: Behavior normal.        Thought Content: Thought content normal.     LABS:      Latest Ref Rng & Units 08/07/2022   12:00 AM 08/02/2022   12:00 AM 07/18/2022   12:00 AM  CBC   WBC  15.8     11.0     11.5  Hemoglobin 12.0 - 16.0 8.5     8.8     10.3      Hematocrit 36 - 46 26     27     31       Platelets 150 - 400 K/uL 28     26     37         This result is from an external source.      Latest Ref Rng & Units 08/07/2022   12:00 AM 08/02/2022   12:00 AM 07/18/2022    8:21 AM  CMP  Glucose 70 - 99 mg/dL   478   BUN 4 - 21 16     18     19    Creatinine 0.5 - 1.1 1.2     0.8     1.08   Sodium 137 - 147 134     142     139   Potassium 3.5 - 5.1 mEq/L 3.9     3.7     3.5   Chloride 99 - 108 102     111     107   CO2 13 - 22 25     23     22    Calcium 8.7 - 10.7 8.9     9.1     8.5   Total Protein 6.5 - 8.1 g/dL   7.5   Total Bilirubin 0.3 - 1.2 mg/dL   0.8   Alkaline Phos 25 - 125 119     99     66   AST 13 - 35 49     75     25   ALT 7 - 35 U/L 14     16     15       This result is from an external source.     No results found for: "CEA1", "CEA" / No results found for: "CEA1", "CEA" No results found for: "PSA1" No results found for: "GNF621" No results found for: "CAN125"  No results found for: "TOTALPROTELP", "ALBUMINELP", "A1GS", "A2GS", "BETS", "BETA2SER", "GAMS", "MSPIKE", "SPEI" Lab Results  Component Value Date   TIBC 321 07/09/2022   FERRITIN 790 (H) 07/09/2022   IRONPCTSAT 27 07/09/2022   Lab Results  Component Value Date   LDH 378 (H) 07/09/2022    STUDIES:    Exam(s): [id number] CT/CT ANGIO CHEST  CLINICAL DATA: Shortness of breath and fever for 2 weeks. History  of CLL.   EXAM:  CT ANGIOGRAPHY CHEST WITH CONTRAST   TECHNIQUE:  Multidetector CT imaging of the chest was performed using the  standard protocol during bolus administration of intravenous  contrast. Multiplanar CT image reconstructions and MIPs were  obtained to evaluate the vascular anatomy.   RADIATION DOSE REDUCTION: This exam was performed according to the  departmental dose-optimization program which includes automated  exposure control, adjustment of the  mA and/or kV according to  patient size and/or use of iterative reconstruction technique.   CONTRAST: 80 mL Isovue 370   COMPARISON: 12/03/2016   FINDINGS:  Cardiovascular: There is good opacification of the central and  segmental pulmonary arteries. No focal filling defects are  demonstrated. No evidence of significant pulmonary embolus. Normal  heart size. No pericardial effusions. Normal caliber thoracic aorta  with scattered calcification. Calcification of the coronary  arteries.  Mediastinum/Nodes: Esophagus is decompressed. Small esophageal  hiatal hernia. Lymphadenopathy demonstrated in the base of the neck  and supraclavicular regions, extending into the anterior  mediastinum. Largest anterior mediastinal lymph nodes measure up to  1.6 cm short axis dimension. No hilar lymphadenopathy. Thyroid gland  is unremarkable. Central venous catheter with tip in the SVC.  Lungs/Pleura: Emphysematous changes in the lung apices. There are  focal patchy areas of infiltration demonstrated in the right middle  lobe centrally, in the right lung base medially, and in the left  lung base medially. These changes are nonspecific and could  represent areas of atelectasis or early multifocal pneumonia. No  pleural effusions. No pneumothorax. Airways are patent.  Upper Abdomen: Diffuse enlargement of the spleen with nodular  contour. Lymphadenopathy demonstrated in the retrocrural region and  in the celiac axis. Surgical absence of the gallbladder.  Musculoskeletal: Degenerative changes in the spine.  Review of the MIP images confirms the above findings.   IMPRESSION:  1. No evidence of significant pulmonary embolus.  2. Lymphadenopathy in the base of neck, supraclavicular regions,  anterior mediastinum, retrocrural region, and upper abdomen. This is  likely related to the patient's history of CLL.  3. Scattered focal areas of infiltration in the lungs are  nonspecific and could represent  areas of atelectasis or early  multifocal pneumonia.  4. Splenomegaly with nodular contour likely representing lymphoma  involvement.    HISTORY:   Past Medical History:  Diagnosis Date   Cancer [facility])     Past Surgical History:  Procedure Laterality Date   arm surgery     GALLBLADDER SURGERY     TOTAL KNEE ARTHROPLASTY      Family History  Problem Relation Age of Onset   Cancer Mother    Hypertension Father    Hypertension Sister    Stroke Sister    Hypertension Brother    Stroke Brother     Social History:  reports that she has been smoking cigarettes. She has a 15.00 pack-year smoking history. She has never used smokeless tobacco. She reports current drug use. No history on file for alcohol use.The patient is alone today.  Allergies:  Allergies  Allergen Reactions   Metronidazole     Other reaction(s): Other (See Comments)   Rituximab     Other reaction(s): Other (See Comments)   Sulfa Antibiotics Rash    Current Medications: Current Outpatient Medications  Medication Sig Dispense Refill   azithromycin (ZITHROMAX) 500 MG tablet Take 1 tablet (500 mg total) by mouth daily. 7 tablet 0   alendronate (FOSAMAX) 70 MG tablet      amLODipine (NORVASC) 2.5 MG tablet Take 2.5 mg by mouth daily.     Ascorbic Acid (VITAMIN C WITH ROSE HIPS) 500 MG tablet Take 500 mg by mouth daily.     benzonatate (TESSALON) 100 MG capsule TAKE ONE CAPSULE BY MOUTH 3 TIMES A DAY AS NEEDED 60 capsule 5   calcium carbonate (OSCAL) 1500 (600 Ca) MG TABS tablet Take by mouth.     Cholecalciferol (VITAMIN D3) 125 MCG (5000 UT) TABS Take 1 tablet by mouth daily.     diclofenac Sodium (VOLTAREN) 1 % GEL Apply topically.     diphenoxylate-atropine (LOMOTIL) 2.5-0.025 MG tablet Take 1-2 tablets by mouth every 6 (six) hours.     ibuprofen (ADVIL) 800 MG tablet      KLOR-CON M20 20 MEQ tablet TAKE 1 TABLET BY MOUTH 3 TIMES DAILY. (Patient taking differently: Take 20 mEq by mouth 3 (three) times  daily. Patient voiced currently taking once daily provider aware) 270 tablet 1   lidocaine (LIDODERM) 5 % 2 patches daily.  lidocaine-prilocaine (EMLA) cream Apply 1 Application topically as needed. 30 g 5   Multiple Vitamin (MULTIVITAMIN WITH MINERALS) TABS tablet Take 1 tablet by mouth daily.     Omega-3 Fatty Acids (FISH OIL) 1000 MG CAPS Take 1 capsule by mouth daily.     omeprazole (PRILOSEC) 20 MG capsule TAKE 1 CAPSULE BY MOUTH EVERY DAY 90 capsule 3   oxyCODONE (ROXICODONE) 15 MG immediate release tablet Take 1 tablet (15 mg total) by mouth every 6 (six) hours as needed. 100 tablet 0   prochlorperazine (COMPAZINE) 10 MG tablet TAKE 1 TABLET BY MOUTH EVERY 6 HOURS AS NEEDED 90 tablet 1   vitamin B-12 (CYANOCOBALAMIN) 100 MCG tablet Take 100 mcg by mouth daily.     Current Facility-Administered Medications  Medication Dose Route Frequency Provider Last Rate Last Admin   heparin lock flush 100 unit/mL  500 Units Intracatheter Once PRN [name], MD       Facility-Administered Medications Ordered in Other Visits  Medication Dose Route Frequency Provider Last Rate Last Admin   acetaminophen (TYLENOL) tablet 650 mg  650 mg Oral Once [name], MD       dextrose 5 % solution   Intravenous Once [name], MD       diphenhydrAMINE (BENADRYL) injection 25 mg  25 mg Intravenous Once [name], MD       diphenhydrAMINE (BENADRYL) injection 25 mg  25 mg Intravenous Once [name], MD       Immune Globulin 10% (PRIVIGEN) IV infusion 45 g  0.9 g/kg (Ideal) Intravenous Once [name], MD       influenza vac split quadrivalent PF (FLUARIX) injection 0.5 mL  0.5 mL Intramuscular Once [name], MD       sodium chloride flush (NS) 0.9 % injection 10 mL  10 mL Intracatheter PRN Stefannie Defeo A, PA-C   10 mL at 09/26/20 0839   sodium chloride flush (NS) 0.9 % injection 10 mL  10 mL Intracatheter PRN [name], MD    10 mL at 08/02/22 1340

## 2022-08-07 NOTE — Progress Notes (Signed)
PT ARRIVED TO CLINIC FOR IVIG.  SHE IS VISIBLY SHORT OF BREATH, STATES SHE HURTS ALL OVER AND HAS BEEN FOR 2 WEEKS.  TEMP 100.3, 130/61, 126 HR SITTING, 123/55, HR 122 STANDING.  SATS 98-99% STATES SHE IS SLEEPING 75% OF THE TIME AT HOME  UNABLE TO WALK MORE THAN 20-30 FEET WITHOUT RESTING AND PURSED LIPS BREATHING.    PAC ACCESSED, LABS DRAWN.  PER ANGELA, LPN, WE WILL SEND PT OVER TO SEE DR MCCARTY NOW, AND ALSO SENT LAB TUBES WITH HER.

## 2022-08-08 ENCOUNTER — Inpatient Hospital Stay: Payer: Medicare Other

## 2022-08-08 DIAGNOSIS — D801 Nonfamilial hypogammaglobulinemia: Secondary | ICD-10-CM

## 2022-08-08 DIAGNOSIS — C9110 Chronic lymphocytic leukemia of B-cell type not having achieved remission: Secondary | ICD-10-CM | POA: Diagnosis not present

## 2022-08-08 LAB — URINALYSIS
Bilirubin: NEGATIVE
Hyaline Casts, UA: 3
LEUKOCYTES: NEGATIVE
Nitrite, UA: NEGATIVE
Occult Blood: NEGATIVE
RBC, Urine: 7
Specific Gravity: 1.049
Squamous Epithelial: 126
WBC Urine, dipstick: 4
pH, Urine: 6.5

## 2022-08-08 LAB — RESPIRATORY VIRUS PANEL
INFLUENZA TYPE A: NEGATIVE
INFLUENZA TYPE B: NEGATIVE
Respiratory Syncytial Virus: NEGATIVE
SARS-CoV-2: NEGATIVE

## 2022-08-08 LAB — URINE CULTURE: Escherichia coli: <100000

## 2022-08-08 MED ORDER — DIPHENHYDRAMINE HCL 50 MG/ML IJ SOLN
25.0000 mg | Freq: Once | INTRAMUSCULAR | Status: AC
  Administered 2022-08-08: 25 mg via INTRAVENOUS
  Filled 2022-08-08: qty 1

## 2022-08-08 MED ORDER — ACETAMINOPHEN 325 MG PO TABS
650.0000 mg | ORAL_TABLET | Freq: Once | ORAL | Status: AC
  Administered 2022-08-08: 650 mg via ORAL
  Filled 2022-08-08: qty 2

## 2022-08-08 MED ORDER — DEXTROSE 5 % IV SOLN: Freq: Once | INTRAVENOUS | Status: DC

## 2022-08-08 MED ORDER — IMMUNE GLOBULIN (HUMAN) 10 GM/100ML IV SOLN
0.9000 g/kg | Freq: Once | INTRAVENOUS | Status: AC
  Administered 2022-08-08: 45 g via INTRAVENOUS
  Filled 2022-08-08: qty 50

## 2022-08-08 NOTE — Patient Instructions (Signed)

## 2022-08-08 NOTE — Progress Notes (Signed)
AT THE END OF HER IVIG INFUSION TODAY,  APPROX. 1245, PT COMPLAINED OF FEELING RESTLESS, ACHING ALL OVER AND FACIAL NUMBNESS AROUND HER LEFT LOWER CHEEK AND CHIN.    VITALS ARE STABLE AND UNREMARKABLE, OBSERVED PT UNTIL 1345 WHEN PT WAS TRANSFERRED VIA EMS TO THE [facility] ER FOR FURTHER EVALUATION PER DR Gilman Buttner 'S RECOMMENDATION.  PT STILL HAD SAME COMPLAINTS AT DISCHARGE.  REPORT GIVEN TO EMS.  DR MCCARTY CALLED INFO TO DR Earl Gala IN THE ER.   PAC LEFT ACCESSED AND LABELED PER PROTOCOL.

## 2022-08-09 DIAGNOSIS — D801 Nonfamilial hypogammaglobulinemia: Secondary | ICD-10-CM | POA: Diagnosis not present

## 2022-08-13 ENCOUNTER — Encounter: Payer: Self-pay | Admitting: Oncology

## 2022-08-13 DIAGNOSIS — D801 Nonfamilial hypogammaglobulinemia: Secondary | ICD-10-CM | POA: Diagnosis not present

## 2022-08-14 DIAGNOSIS — D801 Nonfamilial hypogammaglobulinemia: Secondary | ICD-10-CM | POA: Diagnosis not present

## 2022-08-14 NOTE — Progress Notes (Incomplete)
[facility]  [address] 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  She did not require treatment until 2008 and has since been through multiple therapies.  She has been taking venetoclax 400 mg daily with good control of her disease for over 5 years.  Unfortunately her disease is now progressed with worsening thrombocytopenia and developing lymphadenopathy as well as worsening anemia.  She has now had a bone marrow which still shows CLL with a normal cellular bone marrow of 30% but 20% of the cellularity is her CLL.  Flow cytometry confirms at 78% of the lymphocytes are abnormal CD5/CD19 positive B-cell population.  We do not see evidence of transformation.  Cytogenetics are pending.   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  She will proceed with her monthly IVIG next week but I will cancel my follow-up appointment.  Thrombocytopenia Her platelet count has slowly declined over the last few months and was the lowest last week at 38,000, and now down to 26,000.  I think this is an indicator of progressive disease, but could also represent toxicities from the venetoclax.  We will therefore stop that.  She does have some recurring minor lymphadenopathy of the supraclavicular and cervical areas.  Worsening anemia. Her hemoglobin is down to 8.8 now with an MCV of 91.   Plan:  Her bone marrow still does show CLL comprising about 20% of her cellularity.  Her marrow is not hypercellular and we see no evidence of large cell transformation.  I feel her worsening cytopenias represent progression of CLL and we need to change her treatment.  I will stop the venetoclax to see  if her blood counts improve at all and I will check haptoglobin and reticulocyte count to see if she has hemolysis.  Her LDH is mildly elevated but this could be just from her CLL.  Cytogenetics are pending.  I will cancel her May appointment next week but she will proceed with her usual monthly IVIG.  I will see her back in 2 weeks with repeat CBC and CMP to see if her blood counts have improved at all after stopping the venetoclax.  Most likely we will try an alternative Bruton kinase inhibitor and we have 3 other options including acalabrutinib, Zanubrutinib and the newest one, pirtobrutinib.  She has been through numerous other treatments including fludarabine, chlorambucil, Bendamustine, ibrutinib and had a severe allergic reaction to rituximab.  The patient and her family understand the plans discussed today and are in agreement with them.  I have answered their questions.  She knows to contact our office if she develops concerns prior to her next appointment.  I provided 30 minutes of face-to-face time during this encounter and > 50% was spent counseling as documented under my assessment and plan.    Samantha Warren [facility] AT [facility] [address] [place] [address] Dept: [phone] Dept Fax: [phone]   No orders of the defined types were placed in this encounter.     CHIEF COMPLAINT:  CC: CLL with combined immunodeficiency  Current Treatment: IVIG  HISTORY OF PRESENT ILLNESS:  Samantha Warren is a 77 y.o. female with chronic lymphocytic leukemia  originally diagnosed in May 2005.  She was on observation until November 2008. She was initially treated with chlorambucil and prednisone, as she refused intravenous chemotherapy.  She had a partial response to this regimen.  By January 2010, she had progressive disease with a white count over 200,000, with associated anemia, splenomegaly, thrombocytopenia, and  adenopathy.  She then received 5 cycles of fludarabine with good response.  She did well until January 2012, when she had progression once again.  She had a single dose of bendamustine and rituximab, which kept her disease under control for over a year.  However, she had a severe allergic reaction to the rituximab, so has not received further rituximab.  She was hospitalized after the bendamustine because of severe pancytopenia and required multiple transfusions, so was placed on observation.  In March 2013, she had progression of disease again, so was treated with bendamustine for 6 cycles, at a 50% dose reduction, and once again had a excellent response.  She was on observation until August 2014, then had progression of disease, so was placed on ibrutinib 420 mg daily.  The ibrutinib had kept her disease under fairly good control, but then she develops severe bilateral lower extremity cellulitis in April 2016, so ibrutinib was placed on hold.  She had persistent cellulitis, as well as  Clostridium difficile colitis in May 2016 requiring hospitalization.  She was readmitted soon after discharge with worsening cellulitis, requiring a prolonged hospitalization.  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 She eventually had resolution of the cellulitis   As she was largely asymptomatic, we continued observation until February 2017, at which time she had worsening splenomegaly.  Repeat CT imaging in February showed progressive disease, with marked splenomegaly and bulky lymphadenopathy.  She was then placed back on ibrutinib 420mg  daily.  The lymphocytosis, anemia and thrombocytopenia slowly improved.  Due to neutropenia, ibrutinib was placed on hold in December 2017.  The ibrutinib was resumed in January 2018, but discontinued in August 2018 due to toxicities, mainly severe abdominal muscle cramping.  She was hospitalized in early  September 2018 with urinary tract infection and pancytopenia.  She received multiple red blood cell and platelet transfusions during her stay.  She was admitted again in late September 2018 with perineal cellulitis extending to the lower abdomen and upper thigh, as well as persistent pancytopenia.  CT abdomen and pelvis revealed interval decrease in the abdominal lymphadenopathy, chronic massive splenomegaly with scattered small splenic infarcts, and small left pleural effusion with bibasilar atelectasis.  She required packed red blood cells while hospitalized.  She was discharged home with home health and [facility]  We continued to follow her closely, but did not place her on a treatment due to the persistent cellulitis.  She then was admitted in October 2018 with severe hypercalcemia, with a calcium of 15.  She was treated with IV fluids, zoledronic acid and Calcitonin injections.  We continued to monitor her closely.  She had recurrent hypercalcemia, for which she received IV fluids and zolendronic acid as an outpatient.     Due to worsening pancytopenia, she underwent bone marrow biopsy in January 2019.   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  The cellulitis had improved to a point we felt she could be treated again, so she was placed on venetoclax CLL in January 2019.  She has  continued on venetoclax 400 mg daily after ramp up dosing and has tolerated that fairly well, except for mild diarrhea.  Since starting venetoclax, she has had decreasing splenomegaly and improvement in her pancytopenia.  She initially continued to require IV fluids and zoledronic acid for the hypercalcemia.  Her last dose of zoledronic acid was given in March 2019.  In March, she had an irregular heart rhythm and EKG revealed occasional premature atrial complexes, as well as an incomplete right bundle branch block.  This was stable  compared to EKG done in October 2018.  Quantitative immunoglobulins done in August 2019 revealed a normal IgG, with low IgA and IgM.  She has continued IVIG monthly.  She had an episode of Campylobacter diarrhea in November 2019, which was treated with azithromycin for 7 days with resolution of her diarrhea. Venetoclax was held temporarily until her diarrhea resolved.  She has since tolerated venetoclax without significant difficulty.     In February 2022, she had a right diagnostic mammogram due to right breast subareolar tenderness, which did not reveal any evidence of malignancy.  CT abdomen in April did not reveal any acute abnormality or other findings to explain the left-sided abdominal pain.  There was resolution of previously seen gross splenomegaly.  There were prominent retroperitoneal lymph nodes, which had significantly diminished in size compared to prior exam.  A hiatal hernia was seen. There was descending colonic diverticulosis without evidence of acute diverticulitis.  Bilateral screening mammogram in May 2022 revealed possible asymmetry in the left breast.  Left diagnostic mammogram and ultrasound in June revealed a cluster of cysts/apocrine cyst in the left breast at 2 o'clock, 3 cm the nipple, measuring 6 x 3 x 6 mm, unchanged from the exam dated May 2021. There were no solid masses or suspicious lesions. She is up to date on colonoscopy with her last colonoscopy being in June 2022 with Dr. Jennye Boroughs.  This was negative, and repeat in 10 years was advised.    INTERVAL HISTORY:  Samantha Warren is here today for repeat clinical assessment for CLL with combined immunodeficiency after her bone marrow.  Patient states that she feels *** and ***.     She denies signs of infection such as sore throat, sinus drainage, cough, or urinary symptoms.  She denies fevers or recurrent chills. She denies pain. She denies nausea, vomiting, chest pain, dyspnea or cough. Her weight {Weight change:10426}.   Her  son and daughter are here with her today.  The patient states that she feels cramping and generalized aching intermittently for the past few days.  She complains of feeling weak.  As outlined above, she has been through numerous treatments including chlorambucil, fludarabine, Bendamustine, ibrutinib, and now most recently venetoclax for over 5 years.  She had a prior severe allergic reaction to rituximab.  We performed a bone marrow due to mildly worsening anemia but severe worsening thrombocytopenia.  Her platelets were 50,000 and then came down to 44,000, 38,000 last time and now 26,000.  Her white count is 11,000 with 78.6% lymphocytes and ANC of 1760.  Hemoglobin is 8.8 with an MCV of 91.  I will check for hemolysis with a reticulocyte count and haptoglobin.  Her LDH is elevated but this could be from her CLL. Her bone marrow still shows CLL with a normocellular bone marrow of 30% but 20% of the cellularity is her CLL.  Flow cytometry confirms at 78% of the lymphocytes are abnormal CD5/CD19 positive B-cell population.  We do not  see evidence of transformation.  Cytogenetics are pending.  We will stop the venetoclax. I will cancel her May appointment next week but she will proceed with her usual monthly IVIG.  I will see her back in 2 weeks with repeat CBC and CMP to see if her blood counts have improved at all after stopping the venetoclax.  Most likely we will try an alternative Bruton kinase inhibitor and we have 3 other options including acalabrutinib, Zanubrutinib and the newest one, pirtobrutinib.  She denies signs of infection such as sore throat, sinus drainage, cough, or urinary symptoms.  She denies hematuria or discoloration of her urine.  She denies fevers or recurrent chills. She denies pain. She denies nausea, vomiting, chest pain, dyspnea or cough. Her appetite is good and her weight has decreased 3 pounds over last 2 weeks .  REVIEW OF SYSTEMS:  Review of Systems  Constitutional: Negative.   Negative for appetite change, chills, diaphoresis, fatigue, fever and unexpected weight change.  HENT:  Negative.  Negative for hearing loss, lump/mass, mouth sores, nosebleeds, sore throat, tinnitus, trouble swallowing and voice change.   Eyes: Negative.  Negative for eye problems and icterus.  Respiratory: Negative.  Negative for chest tightness, cough, hemoptysis, shortness of breath and wheezing.   Cardiovascular: Negative.  Negative for chest pain, leg swelling and palpitations.  Gastrointestinal: Negative.  Negative for abdominal distention, abdominal pain, blood in stool, constipation, diarrhea, nausea, rectal pain and vomiting.  Endocrine: Negative.  Negative for hot flashes.  Genitourinary: Negative.  Negative for bladder incontinence, difficulty urinating, dyspareunia, dysuria, frequency, hematuria, menstrual problem, nocturia, pelvic pain, vaginal bleeding and vaginal discharge.   Musculoskeletal:  Positive for arthralgias (in both knees) and myalgias. Negative for back pain, flank pain, gait problem, neck pain and neck stiffness.  Skin: Negative.  Negative for itching, rash and wound.  Neurological:  Negative for dizziness, extremity weakness, gait problem, headaches, light-headedness, numbness, seizures and speech difficulty.  Hematological: Negative.  Negative for adenopathy. Does not bruise/bleed easily.  Psychiatric/Behavioral: Negative.  Negative for confusion, decreased concentration, depression, sleep disturbance and suicidal ideas. The patient is not nervous/anxious.      VITALS:  There were no vitals taken for this visit.  Wt Readings from Last 3 Encounters:  08/07/22 214 lb (97.1 kg)  08/02/22 217 lb 9.6 oz (98.7 kg)  07/24/22 220 lb 6.4 oz (100 kg)    There is no height or weight on file to calculate BMI.  Performance status (ECOG): 1 - Symptomatic but completely ambulatory  PHYSICAL EXAM:  Physical Exam Vitals and nursing note reviewed.  Constitutional:       General: She is not in acute distress.    Appearance: Normal appearance. She is normal weight. She is not ill-appearing, toxic-appearing or diaphoretic.  HENT:     Head: Normocephalic and atraumatic.     Right Ear: Tympanic membrane, ear canal and external ear normal. There is no impacted cerumen.     Left Ear: Tympanic membrane and external ear normal. There is no impacted cerumen.     Nose: Nose normal. No congestion or rhinorrhea.     Mouth/Throat:     Mouth: Mucous membranes are moist.     Pharynx: Oropharynx is clear. No oropharyngeal exudate or posterior oropharyngeal erythema.  Eyes:     General: No scleral icterus.       Right eye: No discharge.        Left eye: No discharge.     Extraocular Movements: Extraocular movements  intact.     Conjunctiva/sclera: Conjunctivae normal.     Pupils: Pupils are equal, round, and reactive to light.  Neck:     Vascular: No carotid bruit.     Comments: A swelling I the submandibular area measuring 2-3 cm, firm, non tender.  Cardiovascular:     Rate and Rhythm: Normal rate and regular rhythm.     Pulses: Normal pulses.     Heart sounds: Normal heart sounds. No murmur heard.    No friction rub. No gallop.  Pulmonary:     Effort: Pulmonary effort is normal. No respiratory distress.     Breath sounds: Normal breath sounds. No stridor. No wheezing, rhonchi or rales.  Chest:     Chest wall: No tenderness.  Abdominal:     General: Bowel sounds are normal. There is no distension.     Palpations: Abdomen is soft. There is no hepatomegaly, splenomegaly or mass.     Tenderness: There is no abdominal tenderness. There is no right CVA tenderness, left CVA tenderness, guarding or rebound.     Hernia: No hernia is present.  Musculoskeletal:        General: No swelling, tenderness, deformity or signs of injury. Normal range of motion.     Cervical back: Normal range of motion and neck supple. No rigidity or tenderness.     Right lower leg: No edema.      Left lower leg: No edema.  Lymphadenopathy:     Cervical: No cervical adenopathy.     Right cervical: No superficial, deep or posterior cervical adenopathy.    Left cervical: No superficial, deep or posterior cervical adenopathy.     Upper Body:     Right upper body: No supraclavicular, axillary or pectoral adenopathy.     Left upper body: No supraclavicular, axillary or pectoral adenopathy.     Lower Body: No right inguinal adenopathy. No left inguinal adenopathy.  Skin:    General: Skin is warm and dry.     Coloration: Skin is not jaundiced or pale.     Findings: No bruising, erythema, lesion or rash.  Neurological:     General: No focal deficit present.     Mental Status: She is alert and oriented to person, place, and time. Mental status is at baseline.     Cranial Nerves: No cranial nerve deficit.     Sensory: No sensory deficit.     Motor: No weakness.     Coordination: Coordination normal.     Gait: Gait normal.     Deep Tendon Reflexes: Reflexes normal.  Psychiatric:        Mood and Affect: Mood normal.        Behavior: Behavior normal.        Thought Content: Thought content normal.        Judgment: Judgment normal.    LABS:      Latest Ref Rng & Units 08/07/2022   12:00 AM 08/02/2022   12:00 AM 07/18/2022   12:00 AM  CBC  WBC  15.8     11.0     11.5      Hemoglobin 12.0 - 16.0 8.5     8.8     10.3      Hematocrit 36 - 46 26     27     31       Platelets 150 - 400 K/uL 28     26     37  This result is from an external source.       Latest Ref Rng & Units 08/07/2022   12:00 AM 08/02/2022   12:00 AM 07/18/2022    8:21 AM  CMP  Glucose 70 - 99 mg/dL   161   BUN 4 - 21 16     18     19    Creatinine 0.5 - 1.1 1.2     0.8     1.08   Sodium 137 - 147 134     142     139   Potassium 3.5 - 5.1 mEq/L 3.9     3.7     3.5   Chloride 99 - 108 102     111     107   CO2 13 - 22 25     23     22    Calcium 8.7 - 10.7 8.9     9.1     8.5   Total Protein 6.5 - 8.1 g/dL    7.5   Total Bilirubin 0.3 - 1.2 mg/dL   0.8   Alkaline Phos 25 - 125 119     99     66   AST 13 - 35 49     75     25   ALT 7 - 35 U/L 14     16     15       This result is from an external source.    No results found for: "CEA1", "CEA" / No results found for: "CEA1", "CEA" No results found for: "PSA1" No results found for: "WRU045" No results found for: "CAN125"  No results found for: "TOTALPROTELP", "ALBUMINELP", "A1GS", "A2GS", "BETS", "BETA2SER", "GAMS", "MSPIKE", "SPEI" Lab Results  Component Value Date   TIBC 321 07/09/2022   FERRITIN 790 (H) 07/09/2022   IRONPCTSAT 27 07/09/2022   Lab Results  Component Value Date   LDH 378 (H) 07/09/2022    STUDIES:       HISTORY:   Past Medical History:  Diagnosis Date   Cancer (HCC)     Past Surgical History:  Procedure Laterality Date   arm surgery     GALLBLADDER SURGERY     TOTAL KNEE ARTHROPLASTY      Family History  Problem Relation Age of Onset   Cancer Mother    Hypertension Father    Hypertension Sister    Stroke Sister    Hypertension Brother    Stroke Brother     Social History:  reports that she has been smoking cigarettes. She has a 15.00 pack-year smoking history. She has never used smokeless tobacco. She reports current drug use. No history on file for alcohol use.The patient is alone today.  Allergies:  Allergies  Allergen Reactions   Metronidazole     Other reaction(s): Other (See Comments)   Rituximab     Other reaction(s): Other (See Comments)   Sulfa Antibiotics Rash    Current Medications: Current Outpatient Medications  Medication Sig Dispense Refill   alendronate (FOSAMAX) 70 MG tablet      amLODipine (NORVASC) 2.5 MG tablet Take 2.5 mg by mouth daily.     Ascorbic Acid (VITAMIN C WITH ROSE HIPS) 500 MG tablet Take 500 mg by mouth daily.     azithromycin (ZITHROMAX) 500 MG tablet Take 1 tablet (500 mg total) by mouth daily. 7 tablet 0   benzonatate (TESSALON) 100 MG capsule TAKE  ONE CAPSULE BY MOUTH 3 TIMES A DAY  AS NEEDED 60 capsule 5   calcium carbonate (OSCAL) 1500 (600 Ca) MG TABS tablet Take by mouth.     Cholecalciferol (VITAMIN D3) 125 MCG (5000 UT) TABS Take 1 tablet by mouth daily.     diclofenac Sodium (VOLTAREN) 1 % GEL Apply topically.     diphenoxylate-atropine (LOMOTIL) 2.5-0.025 MG tablet Take 1-2 tablets by mouth every 6 (six) hours.     ibuprofen (ADVIL) 800 MG tablet      KLOR-CON M20 20 MEQ tablet TAKE 1 TABLET BY MOUTH 3 TIMES DAILY. (Patient taking differently: Take 20 mEq by mouth 3 (three) times daily. Patient voiced currently taking once daily provider aware) 270 tablet 1   lidocaine (LIDODERM) 5 % 2 patches daily.     lidocaine-prilocaine (EMLA) cream Apply 1 Application topically as needed. 30 g 5   Multiple Vitamin (MULTIVITAMIN WITH MINERALS) TABS tablet Take 1 tablet by mouth daily.     Omega-3 Fatty Acids (FISH OIL) 1000 MG CAPS Take 1 capsule by mouth daily.     omeprazole (PRILOSEC) 20 MG capsule TAKE 1 CAPSULE BY MOUTH EVERY DAY 90 capsule 3   oxyCODONE (ROXICODONE) 15 MG immediate release tablet Take 1 tablet (15 mg total) by mouth every 6 (six) hours as needed. 100 tablet 0   prochlorperazine (COMPAZINE) 10 MG tablet TAKE 1 TABLET BY MOUTH EVERY 6 HOURS AS NEEDED 90 tablet 1   vitamin B-12 (CYANOCOBALAMIN) 100 MCG tablet Take 100 mcg by mouth daily.     No current facility-administered medications for this visit.   Facility-Administered Medications Ordered in Other Visits  Medication Dose Route Frequency Provider Last Rate Last Admin   influenza vac split quadrivalent PF (FLUARIX) injection 0.5 mL  0.5 mL Intramuscular Once [name], MD       sodium chloride flush (NS) 0.9 % injection 10 mL  10 mL Intracatheter PRN Belva Crome A, PA-C   10 mL at 09/26/20 1610    I,Jasmine M Lassiter,acting as a scribe for [name], MD.,have documented all relevant documentation on the behalf of [name], MD,as  directed by  [name], MD while in the presence of [name], MD.

## 2022-08-15 ENCOUNTER — Other Ambulatory Visit: Payer: Self-pay | Admitting: Oncology

## 2022-08-15 ENCOUNTER — Ambulatory Visit: Payer: Medicare Other | Admitting: Oncology

## 2022-08-15 ENCOUNTER — Inpatient Hospital Stay: Payer: Medicare Other | Admitting: Oncology

## 2022-08-15 ENCOUNTER — Inpatient Hospital Stay: Payer: Medicare Other

## 2022-08-15 ENCOUNTER — Telehealth: Payer: Self-pay | Admitting: Oncology

## 2022-08-15 DIAGNOSIS — L299 Pruritus, unspecified: Secondary | ICD-10-CM

## 2022-08-15 NOTE — Telephone Encounter (Signed)
08/15/22 Spoke with patient and scheduled next appt on 08/20/22@330pm 

## 2022-08-16 NOTE — Progress Notes (Signed)
[facility]  [address] 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  She did not require treatment until 2008 and has since been through multiple therapies.  She has been taking venetoclax 400 mg daily with good control of her disease for over 5 years.  Unfortunately her disease has now progressed with worsening thrombocytopenia and developing lymphadenopathy as well as worsening anemia.  She has now had a bone marrow which still shows CLL with a normal cellular bone marrow of 30% but 20% of the cellularity is her CLL.  Flow cytometry confirms at 78% of the lymphocytes are abnormal CD5/CD19 positive B-cell population.  We do not see evidence of transformation.  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  She has developed worsening cytopenias and requires frequent transfusions.   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  She will continue with her monthly IVIG.  Thrombocytopenia Her platelet count has slowly declined over the last few months and was the lowest last week at 38,000, and now down to 26,000.  I think this is an indicator of progressive disease, but could also represent toxicities from the venetoclax.  We have therefore stopped that, but see no improvement.  She does have some recurring worsening lymphadenopathy of the supraclavicular and cervical areas, and splenomegaly.  Worsening anemia. Her hemoglobin is down to 7.0 now so she will receive 2 units of PRBC's. She did receive transfusion while in the hospital of PRBC's and platelets.  Recent multifocal pneumonia She is still recovering from  this and no organism was recovered.  Fortunately her ANC remains normal despite the other abnormalities of her blood.  C. difficile colitis She was given oral vancomycin and continues had 4 2 more weeks, for a total of 3 weeks.  I asked Dr. Jennye Boroughs for his opinion about prophylactic treatment and he would recommend that if she does have a recurrent episode of C. difficile colitis.  He feels that further testing of her stool will not be helpful as it would likely still be positive, so we will base it on her clinical symptoms.  If she does have recurrence, we would change to Dificid antibiotic.  Plan:  As outlined before, she has been through numerous treatments, including chlorambucil, fludarabine, Bendamustine, ibrutinib, and now most recently venetoclax for over 5 years.  She had a prior severe allergic reaction to rituximab. Her bone marrow still shows CLL with a normocellular bone marrow of 30% but 20% of the cellularity is her CLL.  Flow cytometry confirms at 78% of the lymphocytes are abnormal CD5/CD19 positive B-cell population. 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 She now has progression of disease with worsening anemia and thrombocytopenia. Patient was recently admitted into the hospital on 08/08/2022 due to severe fatigue, SOB, and malaise. Her CT scan from 08/07/2022 showed developing pneumonia. She was started on IV antibiotics at the [facility] before being transferred to the hospital for further evaluation.She was found to have acute multifocal bacterial pneumonia that was improved with Zosyn but then developed C. Diff and was treated with a 5 day course of oral vancomycin antibiotics  125mg  and given #60 to take for an additional 15 days to continue at home before she was discharged on 08/15/2022. She was referred to Dr. Jennye Boroughs for a follow-up of her C.diff, but due to him being completely booked I will call him as  needed for advice.  Her WBC was 17.6, hemoglobin was 7.8, and platelet count was 27,000 during her hospital stay. Her WBC is 29.1, hemoglobin decreased from 8.8 to 7.0, and platelet count 15,000 today 08/20/2022. Her CMP today is normal. I will order 2 units of RPBC's and 1 plateletpheresis for transfusion on 08/22/2022 at 8:30 a.m. She will also have the Prevnar 20 and meningitis vaccine that same day too, in anticipation of a possible splenectomy. I discussed chemotherapy options with her son and her such as Acalabrutinib, pirtobrutinib, and Zanibrutinib. I informed her that she would need labs done weekly, and likely frequent transfusions. I will see her back in 1 week on the 29th, she will also see Kaitlyn on the same day for oral chemotherapy education, and she will have transfusions done on the 30th. The patient and her family understand the plans discussed today and are in agreement with them.  I have answered their questions.  She knows to contact our office if she develops concerns prior to her next appointment.  I provided 40 minutes of face-to-face time during this encounter and > 50% was spent counseling as documented under my assessment and plan.    [name], MD  [facility] AT [facility] [address] [place] [address] Dept: [phone] Dept Fax: [phone]   Orders Placed This Encounter  Procedures   ABO/Rh    CHIEF COMPLAINT:  CC: CLL with combined immunodeficiency  Current Treatment: IVIG  HISTORY OF PRESENT ILLNESS:  Samantha Warren is a 77 y.o. 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  She was on observation until November 2008. She was initially treated with chlorambucil and prednisone, as she refused intravenous chemotherapy.  She had a partial response to this regimen.  By January 2010, she had progressive disease with a white count over 200,000, with associated  anemia, splenomegaly, thrombocytopenia, and adenopathy.  She then received 5 cycles of fludarabine with good response.  She did well until January 2012, when she had progression once again.  She had a single dose of bendamustine and rituximab, which kept her disease under control for over a year.  However, she had a severe allergic reaction to the rituximab, so has not received further rituximab.  She was hospitalized after the bendamustine because of severe pancytopenia and required multiple transfusions, so was placed on observation.  In March 2013, she had progression of disease again, so was treated with bendamustine for 6 cycles, at a 50% dose reduction, and once again had a excellent response.  She was on observation until August 2014, then had progression of disease, so was placed on ibrutinib 420 mg daily.  The ibrutinib had kept her disease under fairly good control, but then she develops severe bilateral lower extremity cellulitis in April 2016, so ibrutinib was placed on hold.  She had persistent cellulitis, as well as  Clostridium difficile colitis in May 2016 requiring hospitalization.  She was readmitted soon after discharge with worsening cellulitis, requiring a prolonged hospitalization.  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 She eventually had resolution of the  cellulitis   As she was largely asymptomatic, we continued observation until February 2017, at which time she had worsening splenomegaly.  Repeat CT imaging in February showed progressive disease, with marked splenomegaly and bulky lymphadenopathy.  She was then placed back on ibrutinib 420mg  daily.  The lymphocytosis, anemia and thrombocytopenia slowly improved.  Due to neutropenia, ibrutinib was placed on hold in December 2017.  The ibrutinib was resumed in January 2018, but discontinued in August 2018 due to toxicities, mainly severe abdominal  muscle cramping.  She was hospitalized in early September 2018 with urinary tract infection and pancytopenia.  She received multiple red blood cell and platelet transfusions during her stay.  She was admitted again in late September 2018 with perineal cellulitis extending to the lower abdomen and upper thigh, as well as persistent pancytopenia.  CT abdomen and pelvis revealed interval decrease in the abdominal lymphadenopathy, chronic massive splenomegaly with scattered small splenic infarcts, and small left pleural effusion with bibasilar atelectasis.  She required packed red blood cells while hospitalized.  She was discharged home with home health and [facility]  We continued to follow her closely, but did not place her on a treatment due to the persistent cellulitis.  She then was admitted in October 2018 with severe hypercalcemia, with a calcium of 15.  She was treated with IV fluids, zoledronic acid and Calcitonin injections.  We continued to monitor her closely.  She had recurrent hypercalcemia, for which she received IV fluids and zolendronic acid as an outpatient.     Due to worsening pancytopenia, she underwent bone marrow biopsy in January 2019.   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  The cellulitis had improved to a point we felt she could be treated again, so she was placed on venetoclax CLL in January 2019.  She has continued on venetoclax 400 mg daily after ramp up dosing and has tolerated that fairly well, except for mild diarrhea.  Since starting venetoclax, she has had decreasing splenomegaly and improvement in her pancytopenia.  She initially continued to require IV fluids and zoledronic acid for the hypercalcemia.  Her last dose of zoledronic acid was given in March 2019.  In March, she had an irregular heart rhythm and EKG revealed occasional premature atrial complexes, as well as an incomplete  right bundle branch block.  This was stable compared to EKG done in October 2018.  Quantitative immunoglobulins done in August 2019 revealed a normal IgG, with low IgA and IgM.  She has continued IVIG monthly.  She had an episode of Campylobacter diarrhea in November 2019, which was treated with azithromycin for 7 days with resolution of her diarrhea. Venetoclax was held temporarily until her diarrhea resolved.  She has since tolerated venetoclax without significant difficulty.     In February 2022, she had a right diagnostic mammogram due to right breast subareolar tenderness, which did not reveal any evidence of malignancy.  CT abdomen in April did not reveal any acute abnormality or other findings to explain the left-sided abdominal pain.  There was resolution of previously seen gross splenomegaly.  There were prominent retroperitoneal lymph nodes, which had significantly diminished in size compared to prior exam.  A hiatal hernia was seen. There was descending colonic diverticulosis without evidence of acute diverticulitis.  Bilateral screening mammogram in May 2022 revealed possible asymmetry in the left breast.  Left diagnostic mammogram and ultrasound in June revealed a cluster of cysts/apocrine cyst  in the left breast at 2 o'clock, 3 cm the nipple, measuring 6 x 3 x 6 mm, unchanged from the exam dated May 2021. There were no solid masses or suspicious lesions. She is up to date on colonoscopy with her last colonoscopy being in June 2022 with Dr. Jennye Boroughs.  This was negative, and repeat in 10 years was advised.    INTERVAL HISTORY:  Quenna is here today for repeat clinical assessment for CLL with combined immunodeficiency.  As outlined before, she has been through numerous treatments including chlorambucil, fludarabine, Bendamustine, ibrutinib, and now most recently venetoclax for over 5 years.  She had a prior severe allergic reaction to rituximab. Her bone marrow still shows CLL with a normocellular  bone marrow of 30% but 20% of the cellularity is her CLL.  Flow cytometry confirms at 78% of the lymphocytes are abnormal CD5/CD19 positive B-cell population. 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 She now has progression of disease with worsening anemia and thrombocytopenia. The patient states that she feels very ill and complains of SOB with exertion, numbness on the left side of her jaw and pain in her right leg on the side. Her temperature today was at 100.5 degrees. Patient was recently admitted into the hospital on 08/08/2022 due to severe fatigue, SOB, and malaise. Her CT scan from 08/07/2022 showed developing pneumonia. She was started on IV antibiotics at the [facility] before being transferred to the hospital for further evaluation.She was found to have acute multifocal bacterial pneumonia that was improved with Zosyn but then developed C. Diff and was treated with a 5 day course of vancomycin antibiotics 125mg  and given 60 to take for an additional 15 days to continue at home before she was discharged on 08/15/2022. She was referred to Dr. Jennye Boroughs for a follow-up of her C.dif colitis.  However, due to him being completely booked I will call him as needed for advice. Her WBC was 17.6, hemoglobin was 7.8, and platelet count was 27,000 during her hospital stay. Her WBC is 29.1, hemoglobin decreased from 8.8 to 7.0, and platelet count 15,000 today, 08/20/2022. Her CMP today is normal. I will order 2 units of RPBC's and 1 plateletpheresis for transfusion on 08/22/2022 at 8:30 a.m. She will also have the Prevnar 20 and meningitis vaccine that same day too, in anticipation of a possible splenectomy. I discussed chemotherapy options with her and her son, such as Acalabrutinib, pirtobrutinib, and Zanibrutinib. I informed her that she would need labs done weekly, and likely frequent transfusions. I will see her back in 1 week on the 29th, she  will also see Kaitlyn on the same day and she will have transfusions done on the 30th. She denies signs of infection such as sore throat, sinus drainage, cough, or urinary symptoms.  She denies fevers or recurrent chills. She denies pain. She denies nausea, vomiting, chest pain, dyspnea or cough. Her appetite is very poor and she eats very little at the moment. Her weight has decreased 11 pounds over last 19 days . This patient is accompanied in the office by her  son .  REVIEW OF SYSTEMS:  Review of Systems  Constitutional:  Positive for appetite change (poor), fatigue and unexpected weight change. Negative for chills, diaphoresis and fever.  HENT:  Negative.  Negative for hearing loss, lump/mass, mouth sores, nosebleeds, sore throat, tinnitus, trouble swallowing and voice change.   Eyes: Negative.  Negative for eye problems  and icterus.  Respiratory:  Positive for shortness of breath (with exertion). Negative for chest tightness, cough, hemoptysis and wheezing.   Cardiovascular: Negative.  Negative for chest pain, leg swelling and palpitations.  Gastrointestinal: Negative.  Negative for abdominal distention, abdominal pain, blood in stool, constipation, diarrhea, nausea, rectal pain and vomiting.  Endocrine: Negative.  Negative for hot flashes.  Genitourinary: Negative.  Negative for bladder incontinence, difficulty urinating, dyspareunia, dysuria, frequency, hematuria, menstrual problem, nocturia, pelvic pain, vaginal bleeding and vaginal discharge.   Musculoskeletal:  Positive for arthralgias (in both knees) and myalgias. Negative for back pain, flank pain, gait problem, neck pain and neck stiffness.  Skin: Negative.  Negative for itching, rash and wound.  Neurological:  Positive for numbness (on the left side of her jaw). Negative for dizziness, extremity weakness, gait problem, headaches, light-headedness, seizures and speech difficulty.  Hematological:  Negative for adenopathy. Bruises/bleeds  easily.  Psychiatric/Behavioral: Negative.  Negative for confusion, decreased concentration, depression, sleep disturbance and suicidal ideas. The patient is not nervous/anxious.      VITALS:  Blood pressure (!) 113/42, pulse (!) 117, temperature (!) 100.5 F (38.1 C), temperature source Oral, resp. rate (!) 22, height 5' 1.5" (1.562 m), weight 206 lb 8 oz (93.7 kg), SpO2 100 %.  Wt Readings from Last 3 Encounters:  08/28/22 198 lb 3.2 oz (89.9 kg)  08/20/22 206 lb 8 oz (93.7 kg)  08/07/22 214 lb (97.1 kg)    Body mass index is 38.39 kg/m.  Performance status (ECOG): 1 - Symptomatic but completely ambulatory  PHYSICAL EXAM:  Physical Exam Vitals and nursing note reviewed.  Constitutional:      General: She is not in acute distress.    Appearance: Normal appearance. She is normal weight. She is not ill-appearing, toxic-appearing or diaphoretic.  HENT:     Head: Normocephalic and atraumatic.     Right Ear: Tympanic membrane, ear canal and external ear normal. There is no impacted cerumen.     Left Ear: Tympanic membrane and external ear normal. There is no impacted cerumen.     Nose: Nose normal. No congestion or rhinorrhea.     Mouth/Throat:     Mouth: Mucous membranes are moist.     Pharynx: Oropharynx is clear. No oropharyngeal exudate or posterior oropharyngeal erythema.  Eyes:     General: No scleral icterus.       Right eye: No discharge.        Left eye: No discharge.     Extraocular Movements: Extraocular movements intact.     Conjunctiva/sclera: Conjunctivae normal.     Pupils: Pupils are equal, round, and reactive to light.  Neck:     Vascular: No carotid bruit.     Comments: A swelling I the submandibular area measuring 2-3 cm, firm, non tender.  Cardiovascular:     Rate and Rhythm: Regular rhythm. Tachycardia present.     Pulses: Normal pulses.     Heart sounds: Normal heart sounds. No murmur heard.    No friction rub. No gallop.  Pulmonary:     Effort:  Pulmonary effort is normal. No respiratory distress.     Breath sounds: Normal breath sounds. No stridor. No wheezing, rhonchi or rales.  Chest:     Chest wall: No tenderness.  Abdominal:     General: Bowel sounds are normal. There is no distension.     Palpations: Abdomen is soft. There is no hepatomegaly, splenomegaly or mass.     Tenderness: There is no abdominal  tenderness. There is no right CVA tenderness, left CVA tenderness, guarding or rebound.     Hernia: No hernia is present.  Musculoskeletal:        General: No swelling, tenderness, deformity or signs of injury. Normal range of motion.     Cervical back: Normal range of motion and neck supple. No rigidity or tenderness.     Right lower leg: No edema.     Left lower leg: No edema.  Lymphadenopathy:     Cervical: No cervical adenopathy.     Right cervical: No superficial, deep or posterior cervical adenopathy.    Left cervical: No superficial, deep or posterior cervical adenopathy.     Upper Body:     Right upper body: No supraclavicular, axillary or pectoral adenopathy.     Left upper body: No supraclavicular, axillary or pectoral adenopathy.     Lower Body: No right inguinal adenopathy. No left inguinal adenopathy.     Comments: Mild to moderate adenopathy.   Skin:    General: Skin is warm and dry.     Coloration: Skin is not jaundiced or pale.     Findings: No bruising, erythema, lesion or rash.  Neurological:     General: No focal deficit present.     Mental Status: She is alert and oriented to person, place, and time. Mental status is at baseline.     Cranial Nerves: No cranial nerve deficit.     Sensory: No sensory deficit.     Motor: No weakness.     Coordination: Coordination normal.     Gait: Gait normal.     Deep Tendon Reflexes: Reflexes normal.  Psychiatric:        Mood and Affect: Mood normal.        Behavior: Behavior normal.        Thought Content: Thought content normal.        Judgment: Judgment  normal.    LABS:      Latest Ref Rng & Units 08/28/2022   12:00 AM 08/20/2022   12:00 AM 08/07/2022   12:00 AM  CBC  WBC  43.4     29.1     15.8      Hemoglobin 12.0 - 16.0 8.3     7.0     8.5      Hematocrit 36 - 46 26     22     26       Platelets 150 - 400 K/uL 8     15     28          This result is from an external source.      Latest Ref Rng & Units 08/28/2022   12:00 AM 08/20/2022   12:00 AM 08/07/2022   12:00 AM  CMP  BUN 4 - 21 24     16     16       Creatinine 0.5 - 1.1 1.3     0.8     1.2      Sodium 137 - 147 134     135     134      Potassium 3.5 - 5.1 mEq/L 5.4     4.6     3.9      Chloride 99 - 108 100     102     102      CO2 13 - 22 20     26      25  Calcium 8.7 - 10.7 9.8     9.5     8.9      Alkaline Phos 25 - 125 208     149     119      AST 13 - 35 58     58     49      ALT 7 - 35 U/L 18     14     14          This result is from an external source.   No results found for: "CEA1", "CEA" / No results found for: "CEA1", "CEA" No results found for: "PSA1" No results found for: "WUJ811" No results found for: "CAN125"  No results found for: "TOTALPROTELP", "ALBUMINELP", "A1GS", "A2GS", "BETS", "BETA2SER", "GAMS", "MSPIKE", "SPEI" Lab Results  Component Value Date   TIBC 321 07/09/2022   FERRITIN 790 (H) 07/09/2022   IRONPCTSAT 27 07/09/2022   Lab Results  Component Value Date   LDH 378 (H) 07/09/2022    STUDIES:         HISTORY:   Past Medical History:  Diagnosis Date   Cancer (HCC)     Past Surgical History:  Procedure Laterality Date   arm surgery     GALLBLADDER SURGERY     TOTAL KNEE ARTHROPLASTY      Family History  Problem Relation Age of Onset   Cancer Mother    Hypertension Father    Hypertension Sister    Stroke Sister    Hypertension Brother    Stroke Brother     Social History:  reports that she has been smoking cigarettes. She has a 15.00 pack-year smoking history. She has never used smokeless tobacco. She  reports current drug use. No history on file for alcohol use.The patient is alone today.  Allergies:  Allergies  Allergen Reactions   Metronidazole     Other reaction(s): Other (See Comments)   Rituximab     Other reaction(s): Other (See Comments)   Sulfa Antibiotics Rash    Current Medications: Current Outpatient Medications  Medication Sig Dispense Refill   predniSONE (DELTASONE) 10 MG tablet Take 2 tablets (20 mg total) by mouth 2 (two) times daily with a meal. 60 tablet 1   vancomycin (VANCOCIN) 125 MG capsule Take by mouth.     alendronate (FOSAMAX) 70 MG tablet      allopurinol (ZYLOPRIM) 300 MG tablet Take 1 tablet (300 mg total) by mouth daily. 30 tablet 1   amLODipine (NORVASC) 2.5 MG tablet Take 2.5 mg by mouth daily.     Ascorbic Acid (VITAMIN C WITH ROSE HIPS) 500 MG tablet Take 500 mg by mouth daily.     benzonatate (TESSALON) 100 MG capsule TAKE ONE CAPSULE BY MOUTH 3 TIMES A DAY AS NEEDED 60 capsule 5   calcium carbonate (OSCAL) 1500 (600 Ca) MG TABS tablet Take by mouth.     Cholecalciferol (VITAMIN D3) 125 MCG (5000 UT) TABS Take 1 tablet by mouth daily.     diclofenac Sodium (VOLTAREN) 1 % GEL Apply topically.     diphenoxylate-atropine (LOMOTIL) 2.5-0.025 MG tablet Take 1-2 tablets by mouth every 6 (six) hours.     KLOR-CON M20 20 MEQ tablet TAKE 1 TABLET BY MOUTH 3 TIMES DAILY. (Patient taking differently: Take 20 mEq by mouth 3 (three) times daily. Patient voiced currently taking once daily provider aware) 270 tablet 1   lidocaine (LIDODERM) 5 % 2 patches daily.     lidocaine-prilocaine (EMLA) cream  Apply 1 Application topically as needed. 30 g 5   Multiple Vitamin (MULTIVITAMIN WITH MINERALS) TABS tablet Take 1 tablet by mouth daily.     Omega-3 Fatty Acids (FISH OIL) 1000 MG CAPS Take 1 capsule by mouth daily.     omeprazole (PRILOSEC) 20 MG capsule TAKE 1 CAPSULE BY MOUTH EVERY DAY 90 capsule 3   oxyCODONE (ROXICODONE) 15 MG immediate release tablet Take 1  tablet (15 mg total) by mouth every 6 (six) hours as needed. 100 tablet 0   pirtobrutinib (JAYPIRCA) 50 MG tablet Take 1 tablet (50 mg total) by mouth daily. 30 tablet 0   prochlorperazine (COMPAZINE) 10 MG tablet TAKE 1 TABLET BY MOUTH EVERY 6 HOURS AS NEEDED 90 tablet 1   No current facility-administered medications for this visit.   Facility-Administered Medications Ordered in Other Visits  Medication Dose Route Frequency Provider Last Rate Last Admin   alum & mag hydroxide-simeth (MAALOX/MYLANTA) 200-200-20 MG/5ML suspension 30 mL  30 mL Oral Once [name], MD       influenza vac split quadrivalent PF (FLUARIX) injection 0.5 mL  0.5 mL Intramuscular Once [name], MD       sodium chloride flush (NS) 0.9 % injection 10 mL  10 mL Intracatheter PRN Belva Crome A, PA-C   10 mL at 09/26/20 1610    I,Jasmine M Lassiter,acting as a scribe for [name], MD.,have documented all relevant documentation on the behalf of [name], MD,as directed by  [name], MD while in the presence of [name], MD.

## 2022-08-19 ENCOUNTER — Encounter: Payer: Self-pay | Admitting: Oncology

## 2022-08-20 ENCOUNTER — Other Ambulatory Visit: Payer: Self-pay | Admitting: Oncology

## 2022-08-20 ENCOUNTER — Inpatient Hospital Stay (INDEPENDENT_AMBULATORY_CARE_PROVIDER_SITE_OTHER): Payer: Medicare Other | Admitting: Oncology

## 2022-08-20 ENCOUNTER — Encounter: Payer: Self-pay | Admitting: Oncology

## 2022-08-20 ENCOUNTER — Inpatient Hospital Stay: Payer: Medicare Other

## 2022-08-20 VITALS — BP 113/42 | HR 117 | Temp 100.5°F | Resp 22 | Ht 61.5 in | Wt 206.5 lb

## 2022-08-20 DIAGNOSIS — C9110 Chronic lymphocytic leukemia of B-cell type not having achieved remission: Secondary | ICD-10-CM | POA: Diagnosis not present

## 2022-08-20 DIAGNOSIS — D61818 Other pancytopenia: Secondary | ICD-10-CM

## 2022-08-20 DIAGNOSIS — D801 Nonfamilial hypogammaglobulinemia: Secondary | ICD-10-CM

## 2022-08-20 DIAGNOSIS — R509 Fever, unspecified: Secondary | ICD-10-CM

## 2022-08-20 DIAGNOSIS — A0472 Enterocolitis due to Clostridium difficile, not specified as recurrent: Secondary | ICD-10-CM | POA: Diagnosis not present

## 2022-08-20 LAB — SAMPLE TO BLOOD BANK

## 2022-08-20 LAB — CBC: RBC: 2.45 — AB (ref 3.87–5.11)

## 2022-08-20 LAB — CBC AND DIFFERENTIAL
HCT: 22 — AB (ref 36–46)
Hemoglobin: 7 — AB (ref 12.0–16.0)
Neutrophils Absolute: 4.37
Platelets: 15 10*3/uL — AB (ref 150–400)
WBC: 29.1

## 2022-08-20 LAB — HEPATIC FUNCTION PANEL
ALT: 14 U/L (ref 7–35)
AST: 58 — AB (ref 13–35)
Alkaline Phosphatase: 149 — AB (ref 25–125)
Bilirubin, Total: 1.1

## 2022-08-20 LAB — COMPREHENSIVE METABOLIC PANEL
Albumin: 3.9 (ref 3.5–5.0)
Calcium: 9.5 (ref 8.7–10.7)

## 2022-08-20 LAB — BASIC METABOLIC PANEL
BUN: 16 (ref 4–21)
CO2: 26 — AB (ref 13–22)
Chloride: 102 (ref 99–108)
Creatinine: 0.8 (ref 0.5–1.1)
Glucose: 109
Potassium: 4.6 mEq/L (ref 3.5–5.1)
Sodium: 135 — AB (ref 137–147)

## 2022-08-20 LAB — ABO/RH: ABO/RH(D): B NEG

## 2022-08-20 MED ORDER — SODIUM CHLORIDE 0.9% FLUSH
10.0000 mL | Freq: Once | INTRAVENOUS | Status: AC | PRN
  Administered 2022-08-20: 10 mL

## 2022-08-20 MED ORDER — HEPARIN SOD (PORK) LOCK FLUSH 100 UNIT/ML IV SOLN
500.0000 [IU] | Freq: Once | INTRAVENOUS | Status: AC | PRN
  Administered 2022-08-20: 500 [IU]

## 2022-08-20 NOTE — Progress Notes (Signed)
CRITICAL VALUE STICKER  CRITICAL VALUE: Plt 15; Hgb 7  RECEIVER (on-site recipient of call):  Robyne Askew.  DATE & TIME NOTIFIED:   08/20/2022 @ 1559  MESSENGER (representative from lab):  Morrie Sheldon From [facility] lab   MD NOTIFIED:   Dr. Gilman Buttner  TIME OF NOTIFICATION:  1600  RESPONSE: Scheduled transfusion for Thursday 5//23/24 @ 0830 at [facility].

## 2022-08-21 ENCOUNTER — Encounter: Payer: Self-pay | Admitting: Oncology

## 2022-08-21 DIAGNOSIS — C9110 Chronic lymphocytic leukemia of B-cell type not having achieved remission: Secondary | ICD-10-CM | POA: Diagnosis not present

## 2022-08-21 LAB — TYPE AND SCREEN: Antibody Screen: NEGATIVE

## 2022-08-21 LAB — PREPARE RBC (CROSSMATCH)

## 2022-08-21 LAB — BPAM PLATELET PHERESIS: Blood Product Expiration Date: 202405222359

## 2022-08-21 NOTE — Addendum Note (Signed)
Addended by: Domenic Schwab on: 08/21/2022 01:05 PM   Modules accepted: Orders

## 2022-08-22 ENCOUNTER — Inpatient Hospital Stay: Payer: Medicare Other

## 2022-08-22 ENCOUNTER — Ambulatory Visit: Payer: Medicare Other

## 2022-08-22 VITALS — BP 130/67 | HR 81 | Temp 98.0°F | Resp 18 | Ht 61.5 in

## 2022-08-22 DIAGNOSIS — R509 Fever, unspecified: Secondary | ICD-10-CM

## 2022-08-22 DIAGNOSIS — C9110 Chronic lymphocytic leukemia of B-cell type not having achieved remission: Secondary | ICD-10-CM

## 2022-08-22 DIAGNOSIS — L299 Pruritus, unspecified: Secondary | ICD-10-CM

## 2022-08-22 DIAGNOSIS — D61818 Other pancytopenia: Secondary | ICD-10-CM

## 2022-08-22 DIAGNOSIS — D801 Nonfamilial hypogammaglobulinemia: Secondary | ICD-10-CM

## 2022-08-22 LAB — BPAM RBC
Blood Product Expiration Date: 202406122359
ISSUE DATE / TIME: 202405230724
Unit Type and Rh: 9500

## 2022-08-22 LAB — TYPE AND SCREEN: ABO/RH(D): B NEG

## 2022-08-22 LAB — PREPARE PLATELET PHERESIS

## 2022-08-22 LAB — BPAM PLATELET PHERESIS
ISSUE DATE / TIME: 202405230729
Unit Type and Rh: 6200

## 2022-08-22 MED ORDER — DIPHENHYDRAMINE HCL 25 MG PO CAPS
25.0000 mg | ORAL_CAPSULE | Freq: Once | ORAL | Status: AC
  Administered 2022-08-22: 25 mg via ORAL
  Filled 2022-08-22: qty 1

## 2022-08-22 MED ORDER — SODIUM CHLORIDE 0.9% IV SOLUTION
250.0000 mL | Freq: Once | INTRAVENOUS | Status: AC
  Administered 2022-08-22: 250 mL via INTRAVENOUS

## 2022-08-22 MED ORDER — MENINGOCOCCAL VAC B (OMV) IM SUSY
0.5000 mL | PREFILLED_SYRINGE | Freq: Once | INTRAMUSCULAR | Status: AC
  Administered 2022-08-22: 0.5 mL via INTRAMUSCULAR
  Filled 2022-08-22: qty 0.5

## 2022-08-22 MED ORDER — HAEMOPHILUS B POLYSAC CONJ VAC IM SOLR
0.5000 mL | Freq: Once | INTRAMUSCULAR | Status: AC
  Administered 2022-08-22: 0.5 mL via INTRAMUSCULAR
  Filled 2022-08-22: qty 0.5

## 2022-08-22 MED ORDER — DIPHENHYDRAMINE HCL 50 MG/ML IJ SOLN
25.0000 mg | Freq: Once | INTRAMUSCULAR | Status: AC
  Administered 2022-08-22: 25 mg via INTRAVENOUS
  Filled 2022-08-22: qty 1

## 2022-08-22 MED ORDER — MENINGOCOCCAL A C Y&W-135 OLIG IM SOLN
0.5000 mL | Freq: Once | INTRAMUSCULAR | Status: AC
  Administered 2022-08-22: 0.5 mL via INTRAMUSCULAR
  Filled 2022-08-22: qty 0.5

## 2022-08-22 MED ORDER — ACETAMINOPHEN 325 MG PO TABS
650.0000 mg | ORAL_TABLET | Freq: Once | ORAL | Status: AC
  Administered 2022-08-22: 650 mg via ORAL
  Filled 2022-08-22: qty 2

## 2022-08-22 MED ORDER — PNEUMOCOCCAL 20-VAL CONJ VACC 0.5 ML IM SUSY
0.5000 mL | PREFILLED_SYRINGE | INTRAMUSCULAR | Status: AC
  Administered 2022-08-22: 0.5 mL via INTRAMUSCULAR

## 2022-08-22 NOTE — Patient Instructions (Signed)
Vaccine info given separately--VIS from CDC site for all 4 vaccines given.  JL  Blood Transfusion, Adult, Care After The following information offers guidance on how to care for yourself after your procedure. Your health care provider may also give you more specific instructions. If you have problems or questions, contact your health care provider. What can I expect after the procedure? After the procedure, it is common to have: Bruising and soreness where the IV was inserted. A headache. Follow these instructions at home: IV insertion site care     Follow instructions from your health care provider about how to take care of your IV insertion site. Make sure you: Wash your hands with soap and water for at least 20 seconds before and after you change your bandage (dressing). If soap and water are not available, use hand sanitizer. Change your dressing as told by your health care provider. Check your IV insertion site every day for signs of infection. Check for: Redness, swelling, or pain. Bleeding from the site. Warmth. Pus or a bad smell. General instructions Take over-the-counter and prescription medicines only as told by your health care provider. Rest as told by your health care provider. Return to your normal activities as told by your health care provider. Keep all follow-up visits. Lab tests may need to be done at certain periods to recheck your blood counts. Contact a health care provider if: You have itching or red, swollen areas of skin (hives). You have a fever or chills. You have pain in the head, back, or chest. You feel anxious or you feel weak after doing your normal activities. You have redness, swelling, warmth, or pain around the IV insertion site. You have blood coming from the IV insertion site that does not stop with pressure. You have pus or a bad smell coming from your IV insertion site. If you received your blood transfusion in an outpatient setting, you will  be told whom to contact to report any reactions. Get help right away if: You have symptoms of a serious allergic or immune system reaction, including: Trouble breathing or shortness of breath. Swelling of the face, feeling flushed, or widespread rash. Dark urine or blood in the urine. Fast heartbeat. These symptoms may be an emergency. Get help right away. Call 911. Do not wait to see if the symptoms will go away. Do not drive yourself to the hospital. Summary Bruising and soreness around the IV insertion site are common. Check your IV insertion site every day for signs of infection. Rest as told by your health care provider. Return to your normal activities as told by your health care provider. Get help right away for symptoms of a serious allergic or immune system reaction to the blood transfusion. This information is not intended to replace advice given to you by your health care provider. Make sure you discuss any questions you have with your health care provider. Document Revised: 06/15/2021 Document Reviewed: 06/15/2021 Elsevier Patient Education  2024 [employer].

## 2022-08-23 LAB — TYPE AND SCREEN
Unit division: 0
Unit division: 0

## 2022-08-23 LAB — BPAM RBC
Blood Product Expiration Date: 202406092359
ISSUE DATE / TIME: 202405230724
Unit Type and Rh: 9500

## 2022-08-23 LAB — PREPARE PLATELET PHERESIS
Unit division: 0
Unit division: 0

## 2022-08-23 LAB — BPAM PLATELET PHERESIS
Blood Product Expiration Date: 202405252359
Unit Type and Rh: 7300

## 2022-08-23 NOTE — Progress Notes (Signed)
[facility]  [address] 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  She did not require treatment until 2008 and has since been through multiple therapies.  She has been taking venetoclax 400 mg daily with good control of her disease for over 5 years.  Unfortunately her disease is now progressed with worsening thrombocytopenia and developing lymphadenopathy as well as worsening anemia.  She has now had a bone marrow which still shows CLL with a normal cellular bone marrow of 30% but 20% of the cellularity is her CLL.  Flow cytometry confirms at 78% of the lymphocytes are abnormal CD5/CD19 positive B-cell population.  We do not see evidence of transformation.  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  She will proceed with her monthly IVIG next week but I will cancel my follow-up appointment.   Thrombocytopenia Her platelet count has slowly declined over the last few months and was the lowest last week at 38,000, down to 26,000, and now down to 8,000. She will be transfused in the morning. I think this is an indicator of progressive disease.  She does have some recurring worsening lymphadenopathy of the supraclavicular and cervical areas, and splenomegaly. We will now place her on Prednisone 20mg  BID.    Worsening anemia. Her hemoglobin is down to 7.0 now so she will receive 2 units of PRBC's. She did receive transfusion while in the hospital of PRBC's and platelets  Plan:  She has just finished a course of oral Vancomycin for her C.diff colitis. She had seen  [facility] before me to discuss pirtobrutinib. We will start at a low-dose of 50 mg daily. Her WBC is 43.4 hemoglobin 8.3, and platelet count 8,000 as of today. I will order a plateletpheresis tomorrow 08/29/2022 at 8:30 am. I will also give her 1L of normal saline for her dehydration and hyponatremia. I informed her that I will place her on steroids to help raise her platelets and possibly red cells. I will prescribe Prednisone 20mg   twice daily, 1 dose in the morning and the second dose in the afternoon. I will also prescribe Allopurinol 300mg  daily. She has a elevated glucose of 183, BUN of 24, creatinine of 1.30, AST of 58, and potassium of 5.4. She has a low sodium of 134. She takes 1 oral potassium supplement daily and I instructed her to stop taking her B-12 and potassium supplements. She has been cautioned not to take NSAIDS. I instructed her to increase her Prilosec from 20mg  to 40mg  daily. I informed her of the potential side effects. She had her last IVIG on 08/08/2022 and she will receive her next dose next week. She will need very close follow-up and supportive care so we will plan to check her weekly for now. I will see her back in 1 week with CBC, CMP, and type and hold. We expect she will need transfusions regularly, probably weekly.  The patient and her family understand the plans discussed today and are in agreement with them.  I have answered their questions.  She knows to contact our office if she develops concerns prior to her next  appointment.  I provided 30 minutes of face-to-face time during this encounter and > 50% was spent counseling as documented under my assessment and plan.    [name], MD  [facility] AT [facility] [address] [place] [address] Dept: [phone] Dept Fax: [phone]   No orders of the defined types were placed in this encounter.    CHIEF COMPLAINT:  CC: CLL with combined  immunodeficiency  Current Treatment: IVIG  HISTORY OF PRESENT ILLNESS:  Samantha Warren is a 77 y.o. 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  She was on observation until November 2008. She was initially treated with chlorambucil and prednisone, as she refused intravenous chemotherapy.  She had a partial response to this regimen.  By January 2010, she had progressive disease with a white count over 200,000, with associated anemia, splenomegaly, thrombocytopenia, and adenopathy.  She then received 5 cycles of fludarabine with good response.  She did well until January 2012, when she had progression once again.  She had a single dose of bendamustine and rituximab, which kept her disease under control for over a year.  However, she had a severe allergic reaction to the rituximab, so has not received further rituximab.  She was hospitalized after the bendamustine because of severe pancytopenia and required multiple transfusions, so was placed on observation.  In March 2013, she had progression of disease again, so was treated with bendamustine for 6 cycles, at a 50% dose reduction, and once again had a excellent response.  She was on observation until August 2014, then had progression of disease, so was placed on ibrutinib 420 mg daily.  The ibrutinib had kept her disease under fairly good control, but then she develops severe bilateral lower extremity cellulitis in April 2016, so ibrutinib was placed on hold.  She had persistent cellulitis, as well as  Clostridium difficile colitis in May 2016 requiring hospitalization.  She was readmitted soon after discharge with worsening cellulitis, requiring a prolonged hospitalization.  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 She eventually had resolution of the cellulitis   As she was largely asymptomatic, we continued observation until  February 2017, at which time she had worsening splenomegaly.  Repeat CT imaging in February showed progressive disease, with marked splenomegaly and bulky lymphadenopathy.  She was then placed back on ibrutinib 420mg  daily.  The lymphocytosis, anemia and thrombocytopenia slowly improved.  Due to neutropenia, ibrutinib was placed on hold in December 2017.  The ibrutinib was resumed in January 2018, but discontinued in August 2018 due to toxicities, mainly severe abdominal muscle cramping.  She was hospitalized in early September 2018 with urinary tract infection and pancytopenia.  She received multiple red blood cell and platelet transfusions during her stay.  She was admitted again in late September 2018 with perineal cellulitis extending to the lower abdomen and upper thigh, as well as persistent pancytopenia.  CT abdomen and pelvis revealed interval decrease in the abdominal lymphadenopathy, chronic massive splenomegaly with scattered small splenic infarcts, and small left pleural effusion with bibasilar atelectasis.  She required packed red blood cells while hospitalized.  She was discharged home with home health and [facility]  We continued to follow her closely, but did not place her on a treatment due to the persistent cellulitis.  She then was admitted in October 2018 with  severe hypercalcemia, with a calcium of 15.  She was treated with IV fluids, zoledronic acid and Calcitonin injections.  We continued to monitor her closely.  She had recurrent hypercalcemia, for which she received IV fluids and zolendronic acid as an outpatient.     Due to worsening pancytopenia, she underwent bone marrow biopsy in January 2019.   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  The cellulitis had improved to a point we felt she could be treated again, so she was placed on venetoclax CLL in January 2019.  She has continued on  venetoclax 400 mg daily after ramp up dosing and has tolerated that fairly well, except for mild diarrhea.  Since starting venetoclax, she has had decreasing splenomegaly and improvement in her pancytopenia.  She initially continued to require IV fluids and zoledronic acid for the hypercalcemia.  Her last dose of zoledronic acid was given in March 2019.  In March, she had an irregular heart rhythm and EKG revealed occasional premature atrial complexes, as well as an incomplete right bundle branch block.  This was stable compared to EKG done in October 2018.  Quantitative immunoglobulins done in August 2019 revealed a normal IgG, with low IgA and IgM.  She has continued IVIG monthly.  She had an episode of Campylobacter diarrhea in November 2019, which was treated with azithromycin for 7 days with resolution of her diarrhea. Venetoclax was held temporarily until her diarrhea resolved.  She has since tolerated venetoclax without significant difficulty.     In February 2022, she had a right diagnostic mammogram due to right breast subareolar tenderness, which did not reveal any evidence of malignancy.  CT abdomen in April did not reveal any acute abnormality or other findings to explain the left-sided abdominal pain.  There was resolution of previously seen gross splenomegaly.  There were prominent retroperitoneal lymph nodes, which had significantly diminished in size compared to prior exam.  A hiatal hernia was seen. There was descending colonic diverticulosis without evidence of acute diverticulitis.  Bilateral screening mammogram in May 2022 revealed possible asymmetry in the left breast.  Left diagnostic mammogram and ultrasound in June revealed a cluster of cysts/apocrine cyst in the left breast at 2 o'clock, 3 cm the nipple, measuring 6 x 3 x 6 mm, unchanged from the exam dated May 2021. There were no solid masses or suspicious lesions. She is up to date on colonoscopy with her last colonoscopy being in June  2022 with Dr. Jennye Boroughs.  This was negative, and repeat in 10 years was advised.    INTERVAL HISTORY:  Samantha Warren is here today for repeat clinical assessment for CLL with combined immunodeficiency after her bone marrow.  As outlined before, she has been through numerous treatments including chlorambucil, fludarabine, Bendamustine, ibrutinib, and now most recently venetoclax for over 5 years.  She had a prior severe allergic reaction to rituximab. Her bone marrow still shows CLL with a normocellular bone marrow of 30% but 20% of the cellularity is her CLL.  Flow cytometry confirms 78% of the lymphocytes are abnormal CD5/CD19 positive B-cell population. 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 She now has progression of disease with worsening anemia and thrombocytopenia. She has just finished a course of oral Vancomycin for her C.diff colitis.The patient states that she feels ok and complains of leg pain rating 5/10 and applies Voltaren gel topically. She had seen [facility] before me to  discuss pirtobrutinib. We will start at a low-dose of 50mg  daily. Her WBC is 43.4 hemoglobin 8.3, and platelet count 8,000 as of today. I will order a plateletpheresis tomorrow 08/29/2022 at 8:30 am. I will also give her 1L of normal saline for her dehydration and hyponatremia. I informed her that I will place her on steroids to help raise her platelets and possibly red cells. I will prescribe Prednisone 20mg   twice daily, 1 dose in the morning and the second dose in the afternoon. I will also prescribed Allopurinol 300mg  daily. She has a elevated glucose of 183, BUN of 24, creatinine of 1.30, AST of 58, potassium of 5.4, and a low sodium of 134. She takes 1 oral potassium supplement daily and I instructed her to stop taking her B-12 and potassium supplements. She has been cautioned not to take NSAIDS. I instructed her to increase her Prilosec from 20 mg to 40 mg daily. I  informed her of the potential side effects. She had her last IVIG on 08/08/2022 and she will receive her next dose next week. She will need very close follow-up and supportive care so we will plan to check her weekly for now. I will see her back in 1 week with CBC, CMP, and type and hold. We expect she will need transfusions regularly, probably weekly.  She denies signs of infection such as sore throat, sinus drainage, cough, or urinary symptoms.  She denies fevers or recurrent chills. She denies pain. She denies nausea, vomiting, chest pain, dyspnea or cough. Her appetite is not good and she only eats little in a day. Her weight has decreased 16 pounds over last weeks . She is accompanied at today's visit with her son.   REVIEW OF SYSTEMS:  Review of Systems  Constitutional:  Positive for appetite change (poor). Negative for chills, diaphoresis, fatigue, fever and unexpected weight change.  HENT:  Negative.  Negative for hearing loss, lump/mass, mouth sores, nosebleeds, sore throat, tinnitus, trouble swallowing and voice change.   Eyes: Negative.  Negative for eye problems and icterus.  Respiratory: Negative.  Negative for chest tightness, cough, hemoptysis, shortness of breath and wheezing.   Cardiovascular: Negative.  Negative for chest pain, leg swelling and palpitations.  Gastrointestinal: Negative.  Negative for abdominal distention, abdominal pain, blood in stool, constipation, diarrhea, nausea, rectal pain and vomiting.  Endocrine: Negative.  Negative for hot flashes.  Genitourinary: Negative.  Negative for bladder incontinence, difficulty urinating, dyspareunia, dysuria, frequency, hematuria, menstrual problem, nocturia, pelvic pain, vaginal bleeding and vaginal discharge.   Musculoskeletal:  Positive for arthralgias (in both knees, legs) and myalgias. Negative for back pain, flank pain, gait problem, neck pain and neck stiffness.  Skin: Negative.  Negative for itching, rash and wound.   Neurological:  Negative for dizziness, extremity weakness, gait problem, headaches, light-headedness, numbness, seizures and speech difficulty.  Hematological: Negative.  Negative for adenopathy. Does not bruise/bleed easily.  Psychiatric/Behavioral: Negative.  Negative for confusion, decreased concentration, depression, sleep disturbance and suicidal ideas. The patient is not nervous/anxious.      VITALS:  Blood pressure 131/61, pulse (!) 124, temperature 98.4 F (36.9 C), temperature source Oral, resp. rate (!) 22, height 5' 1.5" (1.562 m), weight 198 lb 3.2 oz (89.9 kg), SpO2 95 %.  Wt Readings from Last 3 Encounters:  08/28/22 198 lb 3.2 oz (89.9 kg)  08/20/22 206 lb 8 oz (93.7 kg)  08/07/22 214 lb (97.1 kg)    Body mass index is 36.84 kg/m.  Performance status (  ECOG): 1 - Symptomatic but completely ambulatory  PHYSICAL EXAM:  Physical Exam Vitals and nursing note reviewed.  Constitutional:      General: She is not in acute distress.    Appearance: Normal appearance. She is normal weight. She is not ill-appearing, toxic-appearing or diaphoretic.  HENT:     Head: Normocephalic and atraumatic.     Right Ear: Tympanic membrane, ear canal and external ear normal. There is no impacted cerumen.     Left Ear: Tympanic membrane and external ear normal. There is no impacted cerumen.     Nose: Nose normal. No congestion or rhinorrhea.     Mouth/Throat:     Mouth: Mucous membranes are moist.     Pharynx: Oropharynx is clear. No oropharyngeal exudate or posterior oropharyngeal erythema.  Eyes:     General: No scleral icterus.       Right eye: No discharge.        Left eye: No discharge.     Extraocular Movements: Extraocular movements intact.     Conjunctiva/sclera: Conjunctivae normal.     Pupils: Pupils are equal, round, and reactive to light.  Neck:     Vascular: No carotid bruit.  Cardiovascular:     Rate and Rhythm: Normal rate and regular rhythm.     Pulses: Normal pulses.      Heart sounds: Normal heart sounds. No murmur heard.    No friction rub. No gallop.  Pulmonary:     Effort: Pulmonary effort is normal. No respiratory distress.     Breath sounds: Normal breath sounds. No stridor. No wheezing, rhonchi or rales.  Chest:     Chest wall: No tenderness.  Abdominal:     General: Bowel sounds are normal. There is no distension.     Palpations: Abdomen is soft. There is no hepatomegaly, splenomegaly or mass.     Tenderness: There is no abdominal tenderness. There is no right CVA tenderness, left CVA tenderness, guarding or rebound.     Hernia: No hernia is present.  Musculoskeletal:        General: No swelling, tenderness, deformity or signs of injury. Normal range of motion.     Cervical back: Normal range of motion and neck supple. No rigidity or tenderness.     Right lower leg: No edema.     Left lower leg: No edema.  Lymphadenopathy:     Cervical: No cervical adenopathy.     Right cervical: No superficial, deep or posterior cervical adenopathy.    Left cervical: No superficial, deep or posterior cervical adenopathy.     Upper Body:     Right upper body: Axillary adenopathy present. No supraclavicular or pectoral adenopathy.     Left upper body: Supraclavicular adenopathy present. No axillary or pectoral adenopathy.     Lower Body: No right inguinal adenopathy. No left inguinal adenopathy.     Comments: 2 1cm node in the medial left supraclavicular area that are 1-2cm in diameter.  1 node in the right axilla.   Skin:    General: Skin is warm and dry.     Coloration: Skin is not jaundiced or pale.     Findings: No bruising, erythema, lesion or rash.  Neurological:     General: No focal deficit present.     Mental Status: She is alert and oriented to person, place, and time. Mental status is at baseline.     Cranial Nerves: No cranial nerve deficit.     Sensory: No sensory deficit.  Motor: No weakness.     Coordination: Coordination normal.      Gait: Gait normal.     Deep Tendon Reflexes: Reflexes normal.  Psychiatric:        Mood and Affect: Mood normal.        Behavior: Behavior normal.        Thought Content: Thought content normal.        Judgment: Judgment normal.    LABS:      Latest Ref Rng & Units 08/28/2022   12:00 AM 08/20/2022   12:00 AM 08/07/2022   12:00 AM  CBC  WBC  43.4     29.1     15.8      Hemoglobin 12.0 - 16.0 8.3     7.0     8.5      Hematocrit 36 - 46 26     22     26       Platelets 150 - 400 K/uL 8     15     28          This result is from an external source.      Latest Ref Rng & Units 08/28/2022   12:00 AM 08/20/2022   12:00 AM 08/07/2022   12:00 AM  CMP  BUN 4 - 21 24     16     16       Creatinine 0.5 - 1.1 1.3     0.8     1.2      Sodium 137 - 147 134     135     134      Potassium 3.5 - 5.1 mEq/L 5.4     4.6     3.9      Chloride 99 - 108 100     102     102      CO2 13 - 22 20     26     25       Calcium 8.7 - 10.7 9.8     9.5     8.9      Alkaline Phos 25 - 125 208     149     119      AST 13 - 35 58     58     49      ALT 7 - 35 U/L 18     14     14          This result is from an external source.   No results found for: "CEA1", "CEA" / No results found for: "CEA1", "CEA" No results found for: "PSA1" No results found for: "EPP295" No results found for: "CAN125"  No results found for: "TOTALPROTELP", "ALBUMINELP", "A1GS", "A2GS", "BETS", "BETA2SER", "GAMS", "MSPIKE", "SPEI" Lab Results  Component Value Date   TIBC 321 07/09/2022   FERRITIN 790 (H) 07/09/2022   IRONPCTSAT 27 07/09/2022   Lab Results  Component Value Date   LDH 378 (H) 07/09/2022    STUDIES:        HISTORY:   Past Medical History:  Diagnosis Date   Cancer [facility])     Past Surgical History:  Procedure Laterality Date   arm surgery     GALLBLADDER SURGERY     TOTAL KNEE ARTHROPLASTY      Family History  Problem Relation Age of Onset   Cancer Mother    Hypertension Father    Hypertension  Sister    Stroke Sister  Hypertension Brother    Stroke Brother     Social History:  reports that she has been smoking cigarettes. She has a 15.00 pack-year smoking history. She has never used smokeless tobacco. She reports current drug use. No history on file for alcohol use.The patient is alone today.  Allergies:  Allergies  Allergen Reactions   Metronidazole     Other reaction(s): Other (See Comments)   Rituximab     Other reaction(s): Other (See Comments)   Sulfa Antibiotics Rash    Current Medications: Current Outpatient Medications  Medication Sig Dispense Refill   alendronate (FOSAMAX) 70 MG tablet      allopurinol (ZYLOPRIM) 300 MG tablet Take 1 tablet (300 mg total) by mouth daily. 30 tablet 1   amLODipine (NORVASC) 2.5 MG tablet Take 2.5 mg by mouth daily.     Ascorbic Acid (VITAMIN C WITH ROSE HIPS) 500 MG tablet Take 500 mg by mouth daily.     benzonatate (TESSALON) 100 MG capsule TAKE ONE CAPSULE BY MOUTH 3 TIMES A DAY AS NEEDED 60 capsule 5   calcium carbonate (OSCAL) 1500 (600 Ca) MG TABS tablet Take by mouth.     Cholecalciferol (VITAMIN D3) 125 MCG (5000 UT) TABS Take 1 tablet by mouth daily.     diclofenac Sodium (VOLTAREN) 1 % GEL Apply topically.     diphenoxylate-atropine (LOMOTIL) 2.5-0.025 MG tablet Take 1-2 tablets by mouth every 6 (six) hours.     KLOR-CON M20 20 MEQ tablet TAKE 1 TABLET BY MOUTH 3 TIMES DAILY. (Patient taking differently: Take 20 mEq by mouth 3 (three) times daily. Patient voiced currently taking once daily provider aware) 270 tablet 1   lidocaine (LIDODERM) 5 % 2 patches daily.     lidocaine-prilocaine (EMLA) cream Apply 1 Application topically as needed. 30 g 5   Multiple Vitamin (MULTIVITAMIN WITH MINERALS) TABS tablet Take 1 tablet by mouth daily.     Omega-3 Fatty Acids (FISH OIL) 1000 MG CAPS Take 1 capsule by mouth daily.     omeprazole (PRILOSEC) 20 MG capsule TAKE 1 CAPSULE BY MOUTH EVERY DAY 90 capsule 3   oxyCODONE  (ROXICODONE) 15 MG immediate release tablet Take 1 tablet (15 mg total) by mouth every 6 (six) hours as needed. 100 tablet 0   pirtobrutinib (JAYPIRCA) 50 MG tablet Take 1 tablet (50 mg total) by mouth daily. 30 tablet 0   predniSONE (DELTASONE) 10 MG tablet Take 2 tablets (20 mg total) by mouth 2 (two) times daily with a meal. 60 tablet 1   prochlorperazine (COMPAZINE) 10 MG tablet TAKE 1 TABLET BY MOUTH EVERY 6 HOURS AS NEEDED 90 tablet 1   vancomycin (VANCOCIN) 125 MG capsule Take by mouth.     No current facility-administered medications for this visit.   Facility-Administered Medications Ordered in Other Visits  Medication Dose Route Frequency Provider Last Rate Last Admin   alum & mag hydroxide-simeth (MAALOX/MYLANTA) 200-200-20 MG/5ML suspension 30 mL  30 mL Oral Once [name], MD       influenza vac split quadrivalent PF (FLUARIX) injection 0.5 mL  0.5 mL Intramuscular Once [name], MD       sodium chloride flush (NS) 0.9 % injection 10 mL  10 mL Intracatheter PRN Belva Crome A, PA-C   10 mL at 09/26/20 1610    I,Jasmine M Lassiter,acting as a scribe for [name], MD.,have documented all relevant documentation on the behalf of [name], MD,as directed by  Gardiner Fanti  Gilman Buttner, MD while in the presence of [name], MD.

## 2022-08-27 ENCOUNTER — Ambulatory Visit: Payer: Medicare Other | Admitting: Dietician

## 2022-08-27 NOTE — Progress Notes (Signed)
Nutrition Assessment   Reason for Assessment: MST screen for weight loss.    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 Gilman Buttner since 2005.  She has been treated recently for C-diff but patient reports her bowels now regular but she hasn't been eating much so small formed movements. She reports since last infusion she has had numbness on one side of her mouth and can;t chew foods.  Also reports no appetite.  She has only been eating applesauce, jello, and tomato soups.  She has been drinking 1 cup of OJ and water.  She can swallow pills and is taking meds and Vit D3 and MVI.  She doesn't tolerate any dairy (diarrhea), and said that Ensure gives her diarrhea as well, and doesn't like any bean based soups.  She doesn't have a blender at home to blend soups, she doesn't like egg salad but she does like eggs. She is willing to attempt scramble eggs.  She hasn't tried any of the clear ONS yet. She is not sure she could get any canned chicken added to soups, or tuna (mixed with mayo) with out chewing.     Anthropometrics:  Weight loss 13.9# (6.3%) significant for past month  Height: 61.5" Weight:  08/20/22  206.5# 07/24/22  220.4#  UBW: 220# BMI: 38.39    NUTRITION DIAGNOSIS: Inadequate PO intake to meet increased nutrient needs, r/t cancer diagnosis  INTERVENTION:  Relayed that nutrition services are wrap around service provided at no charge and encouraged continued communication if experiencing continued weight loss or any nutritional impact symptoms (NIS). Educated on importance of adequate calorie and protein energy intake  with nutrient dense foods when possible to maintain weight/strength and help improve blood counts Encouraged frequent feed of soups with small bits of meat that she wouldn't have to chew, trial of scrambled eggs trying to eat 6 small meals Suggested oral nutrition supplements that were dairy free.  Will reach out to cancer center to make sample  bag of Unjury broths, Unflavored whey protein powders, and Boost breeze samples Emailed Nutrition Tip sheet  for  Soft Moist Protein with Contact information provided   MONITORING, EVALUATION, GOAL: weight, PO intake, Nutrition Impact Symptoms, labs Goal is weight maintenance  Next Visit: Remote next week to assess tolerance of samples provided and PO intake  Gennaro Africa, RDN, LDN Registered Dietitian, [facility] Part Time Remote (Usual office hours: Tuesday-Thursday) Cell: [phone]

## 2022-08-28 ENCOUNTER — Other Ambulatory Visit: Payer: Self-pay

## 2022-08-28 ENCOUNTER — Encounter: Payer: Self-pay | Admitting: Oncology

## 2022-08-28 ENCOUNTER — Inpatient Hospital Stay: Payer: Medicare Other

## 2022-08-28 ENCOUNTER — Inpatient Hospital Stay (INDEPENDENT_AMBULATORY_CARE_PROVIDER_SITE_OTHER): Payer: Medicare Other | Admitting: Oncology

## 2022-08-28 ENCOUNTER — Other Ambulatory Visit: Payer: Self-pay | Admitting: Oncology

## 2022-08-28 ENCOUNTER — Other Ambulatory Visit: Payer: Self-pay | Admitting: Pharmacist

## 2022-08-28 ENCOUNTER — Other Ambulatory Visit (HOSPITAL_COMMUNITY): Payer: Self-pay

## 2022-08-28 VITALS — BP 131/61 | HR 124 | Temp 98.4°F | Resp 22 | Ht 61.5 in | Wt 198.2 lb

## 2022-08-28 DIAGNOSIS — D801 Nonfamilial hypogammaglobulinemia: Secondary | ICD-10-CM | POA: Diagnosis not present

## 2022-08-28 DIAGNOSIS — R1013 Epigastric pain: Secondary | ICD-10-CM

## 2022-08-28 DIAGNOSIS — C9110 Chronic lymphocytic leukemia of B-cell type not having achieved remission: Secondary | ICD-10-CM

## 2022-08-28 DIAGNOSIS — D649 Anemia, unspecified: Secondary | ICD-10-CM

## 2022-08-28 DIAGNOSIS — D61818 Other pancytopenia: Secondary | ICD-10-CM

## 2022-08-28 LAB — HEPATIC FUNCTION PANEL
ALT: 18 U/L (ref 7–35)
AST: 58 — AB (ref 13–35)
Alkaline Phosphatase: 208 — AB (ref 25–125)
Bilirubin, Total: 1.2

## 2022-08-28 LAB — SAMPLE TO BLOOD BANK

## 2022-08-28 LAB — BASIC METABOLIC PANEL
BUN: 24 — AB (ref 4–21)
CO2: 20 (ref 13–22)
Chloride: 100 (ref 99–108)
Creatinine: 1.3 — AB (ref 0.5–1.1)
Glucose: 183
Potassium: 5.4 mEq/L — AB (ref 3.5–5.1)
Sodium: 134 — AB (ref 137–147)

## 2022-08-28 LAB — CBC AND DIFFERENTIAL
HCT: 26 — AB (ref 36–46)
Hemoglobin: 8.3 — AB (ref 12.0–16.0)
Neutrophils Absolute: 2.17
Platelets: 8 10*3/uL — AB (ref 150–400)
WBC: 43.4

## 2022-08-28 LAB — COMPREHENSIVE METABOLIC PANEL
Albumin: 3.9 (ref 3.5–5.0)
Calcium: 9.8 (ref 8.7–10.7)

## 2022-08-28 LAB — PREPARE PLATELET PHERESIS: Unit division: 0

## 2022-08-28 LAB — BPAM PLATELET PHERESIS

## 2022-08-28 LAB — CBC: RBC: 2.95 — AB (ref 3.87–5.11)

## 2022-08-28 MED ORDER — PIRTOBRUTINIB 50 MG PO TABS
50.0000 mg | ORAL_TABLET | Freq: Every day | ORAL | 0 refills | Status: DC
Start: 2022-08-28 — End: 2022-09-17
  Filled 2022-08-28 – 2022-08-30 (×2): qty 30, 30d supply, fill #0

## 2022-08-28 MED ORDER — PREDNISONE 10 MG PO TABS
20.0000 mg | ORAL_TABLET | Freq: Two times a day (BID) | ORAL | 1 refills | Status: DC
Start: 2022-08-28 — End: 2022-10-15

## 2022-08-28 MED ORDER — ALLOPURINOL 300 MG PO TABS: 300.0000 mg | ORAL_TABLET | Freq: Every day | ORAL | 1 refills | Status: DC

## 2022-08-28 NOTE — Progress Notes (Signed)
CRITICAL VALUE STICKER  CRITICAL VALUE:  Plt 8, Hgb 8.3, WBC 43.4  RECEIVER (on-site recipient of call):  Dyane Dustman RN  DATE & TIME NOTIFIED:   08/28/2022 @ 1551  MESSENGER (representative from lab):  [facility]  MD NOTIFIED:   Dr. Gilman Buttner  TIME OF NOTIFICATION:  1553  RESPONSE:  Transfuse one unit platelets on Thursday 08/29/2022 @0830 

## 2022-08-29 ENCOUNTER — Encounter: Payer: Self-pay | Admitting: Oncology

## 2022-08-29 ENCOUNTER — Other Ambulatory Visit (HOSPITAL_COMMUNITY): Payer: Self-pay

## 2022-08-29 ENCOUNTER — Inpatient Hospital Stay: Payer: Medicare Other

## 2022-08-29 VITALS — BP 117/56 | HR 84 | Temp 97.7°F | Resp 18

## 2022-08-29 DIAGNOSIS — C9110 Chronic lymphocytic leukemia of B-cell type not having achieved remission: Secondary | ICD-10-CM

## 2022-08-29 DIAGNOSIS — R1013 Epigastric pain: Secondary | ICD-10-CM

## 2022-08-29 DIAGNOSIS — D649 Anemia, unspecified: Secondary | ICD-10-CM

## 2022-08-29 DIAGNOSIS — D801 Nonfamilial hypogammaglobulinemia: Secondary | ICD-10-CM

## 2022-08-29 LAB — PREPARE PLATELET PHERESIS: Unit division: 0

## 2022-08-29 LAB — BPAM PLATELET PHERESIS
Blood Product Expiration Date: 202405292359
Unit Type and Rh: 5100

## 2022-08-29 MED ORDER — ALUM & MAG HYDROXIDE-SIMETH 200-200-20 MG/5ML PO SUSP
30.0000 mL | Freq: Once | ORAL | Status: AC
  Administered 2022-08-29: 30 mL via ORAL
  Filled 2022-08-29: qty 30

## 2022-08-29 MED ORDER — ONDANSETRON HCL 4 MG/2ML IJ SOLN
INTRAMUSCULAR | Status: AC
  Filled 2022-08-29: qty 4

## 2022-08-29 MED ORDER — SODIUM CHLORIDE 0.9% IV SOLUTION
250.0000 mL | Freq: Once | INTRAVENOUS | Status: AC
  Administered 2022-08-29: 250 mL via INTRAVENOUS

## 2022-08-29 MED ORDER — ONDANSETRON HCL 4 MG/2ML IJ SOLN
8.0000 mg | Freq: Once | INTRAMUSCULAR | Status: AC
  Administered 2022-08-29: 8 mg via INTRAVENOUS

## 2022-08-29 MED ORDER — HEPARIN SOD (PORK) LOCK FLUSH 100 UNIT/ML IV SOLN
500.0000 [IU] | Freq: Once | INTRAVENOUS | Status: AC | PRN
  Administered 2022-08-29: 500 [IU]

## 2022-08-29 MED ORDER — SODIUM CHLORIDE 0.9% FLUSH
10.0000 mL | Freq: Once | INTRAVENOUS | Status: AC | PRN
  Administered 2022-08-29: 10 mL

## 2022-08-29 MED ORDER — SODIUM CHLORIDE 0.9 % IV SOLN: Freq: Once | INTRAVENOUS | Status: AC

## 2022-08-29 MED ORDER — ACETAMINOPHEN 325 MG PO TABS
650.0000 mg | ORAL_TABLET | Freq: Once | ORAL | Status: DC
  Filled 2022-08-29: qty 2

## 2022-08-29 MED ORDER — DIPHENHYDRAMINE HCL 25 MG PO CAPS
25.0000 mg | ORAL_CAPSULE | Freq: Once | ORAL | Status: DC
  Filled 2022-08-29: qty 1

## 2022-08-29 MED ORDER — ALUM & MAG HYDROXIDE-SIMETH 200-200-20 MG/5ML PO SUSP
30.0000 mL | Freq: Once | ORAL | Status: AC
  Filled 2022-08-29: qty 30

## 2022-08-29 MED ORDER — DIPHENHYDRAMINE HCL 50 MG/ML IJ SOLN
25.0000 mg | Freq: Once | INTRAMUSCULAR | Status: AC
  Administered 2022-08-29: 25 mg via INTRAVENOUS
  Filled 2022-08-29: qty 1

## 2022-08-29 NOTE — Progress Notes (Signed)
Patient vomited approximately 200cc clear pale fluid- Darl Pikes [facility] aware and orders received.

## 2022-08-29 NOTE — Progress Notes (Signed)
Patient states that she is feeling a little better- No additional N/V/D at this time-

## 2022-08-29 NOTE — Progress Notes (Signed)
Patient reports feelings of indigestion, frequent belching noted. Samantha Warren aware- orders received.

## 2022-08-29 NOTE — Progress Notes (Incomplete)
[facility]  [address] 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  She did not require treatment until 2008 and has since been through multiple therapies.  She has been taking venetoclax 400 mg daily with good control of her disease for over 5 years.  Unfortunately her disease is now progressed with worsening thrombocytopenia and developing lymphadenopathy as well as worsening anemia.  She has now had a bone marrow which still shows CLL with a normal cellular bone marrow of 30% but 20% of the cellularity is her CLL.  Flow cytometry confirms at 78% of the lymphocytes are abnormal CD5/CD19 positive B-cell population.  We do not see evidence of transformation.  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  She will proceed with her monthly IVIG next week but I will cancel my follow-up appointment.   Thrombocytopenia Her platelet count has slowly declined over the last few months and was the lowest last week at 38,000, down to 26,000, and now down to 8,000. She will be transfused in the morning. I think this is an indicator of progressive disease.  She does have some recurring worsening lymphadenopathy of the supraclavicular and cervical areas, and splenomegaly. We will now place her on Prednisone 20mg  BID.    Worsening anemia. Her hemoglobin is down to 7.0 now so she will receive 2 units of PRBC's. She did receive transfusion while in the hospital of PRBC's and platelets   Plan:  She has just finished a course of oral Vancomycin for her C.diff colitis. She had seen  [facility] before me to discuss pirtobrutinib. We will start at a low-dose of 50mg  daily. Her WBC is at 43.4 hemoglobin at 8.3, and platelet count at 8,000 as of today. I will order a plateletpheresis tomorrow 08/29/2022 at 8:30am. I will also give her 1L of normal saline for her dehydration and hyponatremia. I informed her that I will place her on steroids to help raise her platelets and possibly red cells. I will prescribe Prednisone 20mg   twice daily, 1 dose in the morning and the second dose in the afternoon. I will also prescribed Allopurinol 300mg  daily. She has a elevated glucose of 183, BUN of 24, creatinine of 1.30, AST of 58, and potassium of 5.4. She has a low sodium of 134 and CO2 of 20 as of today. She takes 1 oral potassium supplement daily and I instructed her to stop taking her B-12 and potassium supplements. She has been cautioned not to take NSAIDS. I instructed her to increase her Prilosec from 20mg  to 40mg  daily. I informed her of the potential side effects. She had her last IVIG on 08/08/2022 and she will receive her next dose next week. She will need very close follow-up and supportive care so we will plan to check her weekly for now. I will see her back in 1 week with CBC, CMP, and type and hold. We expect she will need transfusions regularly, probably weekly.  The patient and her family understand the plans discussed today and are in agreement with them.  I have answered their questions.  She knows to contact our office  if she develops concerns prior to her next appointment.  I provided 30 minutes of face-to-face time during this encounter and > 50% was spent counseling as documented under my assessment and plan.    [facility] AT [facility] [address] [place] [address] Dept: [phone] Dept Fax: [phone]   No orders of the defined types were placed in this encounter.     CHIEF COMPLAINT:  CC:  CLL with combined immunodeficiency  Current Treatment: IVIG  HISTORY OF PRESENT ILLNESS:  Samantha Warren is a 77 y.o. 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  She was on observation until November 2008. She was initially treated with chlorambucil and prednisone, as she refused intravenous chemotherapy.  She had a partial response to this regimen.  By January 2010, she had progressive disease with a white count over 200,000, with associated anemia, splenomegaly, thrombocytopenia, and adenopathy.  She then received 5 cycles of fludarabine with good response.  She did well until January 2012, when she had progression once again.  She had a single dose of bendamustine and rituximab, which kept her disease under control for over a year.  However, she had a severe allergic reaction to the rituximab, so has not received further rituximab.  She was hospitalized after the bendamustine because of severe pancytopenia and required multiple transfusions, so was placed on observation.  In March 2013, she had progression of disease again, so was treated with bendamustine for 6 cycles, at a 50% dose reduction, and once again had a excellent response.  She was on observation until August 2014, then had progression of disease, so was placed on ibrutinib 420 mg daily.  The ibrutinib had kept her disease under fairly good control, but then she develops severe bilateral lower extremity cellulitis in April 2016, so ibrutinib was placed on hold.  She had persistent cellulitis, as well as  Clostridium difficile colitis in May 2016 requiring hospitalization.  She was readmitted soon after discharge with worsening cellulitis, requiring a prolonged hospitalization.  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 She eventually had resolution of the cellulitis   As she was largely asymptomatic, we continued  observation until February 2017, at which time she had worsening splenomegaly.  Repeat CT imaging in February showed progressive disease, with marked splenomegaly and bulky lymphadenopathy.  She was then placed back on ibrutinib 420mg  daily.  The lymphocytosis, anemia and thrombocytopenia slowly improved.  Due to neutropenia, ibrutinib was placed on hold in December 2017.  The ibrutinib was resumed in January 2018, but discontinued in August 2018 due to toxicities, mainly severe abdominal muscle cramping.  She was hospitalized in early September 2018 with urinary tract infection and pancytopenia.  She received multiple red blood cell and platelet transfusions during her stay.  She was admitted again in late September 2018 with perineal cellulitis extending to the lower abdomen and upper thigh, as well as persistent pancytopenia.  CT abdomen and pelvis revealed interval decrease in the abdominal lymphadenopathy, chronic massive splenomegaly with scattered small splenic infarcts, and small left pleural effusion with bibasilar atelectasis.  She required packed red blood cells while hospitalized.  She was discharged home with home health and [facility]  We continued to follow her closely, but did not place her on a treatment due to the persistent cellulitis.  She then was admitted in October 2018 with severe hypercalcemia, with a calcium of 15.  She was treated with IV fluids, zoledronic acid and Calcitonin injections.  We continued to monitor her closely.  She had recurrent hypercalcemia, for which she received IV fluids and zolendronic acid as an outpatient.     Due to worsening pancytopenia, she underwent bone marrow biopsy in January 2019.   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  The cellulitis had improved to a point we felt she could be treated again, so she was placed on venetoclax CLL in January 2019.  She has  continued on venetoclax 400 mg daily after ramp up dosing and has tolerated that fairly well, except for mild diarrhea.  Since starting venetoclax, she has had decreasing splenomegaly and improvement in her pancytopenia.  She initially continued to require IV fluids and zoledronic acid for the hypercalcemia.  Her last dose of zoledronic acid was given in March 2019.  In March, she had an irregular heart rhythm and EKG revealed occasional premature atrial complexes, as well as an incomplete right bundle branch block.  This was stable compared to EKG done in October 2018.  Quantitative immunoglobulins done in August 2019 revealed a normal IgG, with low IgA and IgM.  She has continued IVIG monthly.  She had an episode of Campylobacter diarrhea in November 2019, which was treated with azithromycin for 7 days with resolution of her diarrhea. Venetoclax was held temporarily until her diarrhea resolved.  She has since tolerated venetoclax without significant difficulty.     In February 2022, she had a right diagnostic mammogram due to right breast subareolar tenderness, which did not reveal any evidence of malignancy.  CT abdomen in April did not reveal any acute abnormality or other findings to explain the left-sided abdominal pain.  There was resolution of previously seen gross splenomegaly.  There were prominent retroperitoneal lymph nodes, which had significantly diminished in size compared to prior exam.  A hiatal hernia was seen. There was descending colonic diverticulosis without evidence of acute diverticulitis.  Bilateral screening mammogram in May 2022 revealed possible asymmetry in the left breast.  Left diagnostic mammogram and ultrasound in June revealed a cluster of cysts/apocrine cyst in the left breast at 2 o'clock, 3 cm the nipple, measuring 6 x 3 x 6 mm, unchanged from the exam dated May 2021. There were no solid masses or suspicious lesions. She is up to date on colonoscopy with her last colonoscopy  being in June 2022 with Dr. Jennye Boroughs.  This was negative, and repeat in 10 years was advised.    INTERVAL HISTORY:  Samantha Warren is here today for repeat clinical assessment for CLL with combined immunodeficiency after her bone marrow.  As outlined before, she has been through numerous treatments including chlorambucil, fludarabine, Bendamustine, ibrutinib, and now most recently venetoclax for over 5 years.  She had a prior severe allergic reaction to rituximab. Her bone marrow still shows CLL with a normocellular bone marrow of 30% but 20% of the cellularity is her CLL.  Flow cytometry confirms at 78% of the lymphocytes are abnormal CD5/CD19 positive B-cell population. 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 She now has progression of disease with worsening anemia and thrombocytopenia. The patient states that she feels *** and ***.     She denies signs of infection such as sore throat, sinus drainage, cough, or urinary symptoms.  She denies fevers or recurrent chills. She denies pain. She denies nausea, vomiting, chest pain, dyspnea or cough. Her appetite is *** and her weight {Weight change:10426}. She is accompanied at today's visit with her son.    She has just finished a course of oral Vancomycin for her C.diff colitis.The patient states that she feels ok and complains of leg pain rating 5/10 and applies voltaren gel topically. She had seen [facility] before me to discuss pirtobrutinib. We will start at a low-dose of 50mg  daily. Her WBC is at 43.4 hemoglobin at 8.3, and platelet count at 8,000 as of today. I will order a plateletpheresis tomorrow 08/29/2022 at 8:30am. I will also give her 1L of normal saline for her dehydration and hyponatremia. I informed her that I will place her on steroids to help raise her platelets and possibly red cells. I will prescribe Prednisone 20mg   twice daily, 1 dose in the morning and the second dose in the  afternoon. I will also prescribed Allopurinol 300mg  daily. She has a elevated glucose of 183, BUN of 24, creatinine of 1.30, AST of 58, and potassium of 5.4. She has a low sodium of 134 and CO2 of 20 as of today. She takes 1 oral potassium supplement daily and I instructed her to stop taking her B-12 and potassium supplements. She has been cautioned not to take NSAIDS. I instructed her to increase her Prilosec from 20mg  to 40mg  daily. I informed her of the potential side effects. She had her last IVIG on 08/08/2022 and she will receive her next dose next week. She will need very close follow-up and supportive care so we will plan to check her weekly for now. I will see her back in 1 week with CBC, CMP, and type and hold. We expect she will need transfusions regularly, probably weekly.     REVIEW OF SYSTEMS:  Review of Systems  Constitutional: Negative.  Negative for appetite change, chills, diaphoresis, fatigue, fever and unexpected weight change.  HENT:  Negative.  Negative for hearing loss, lump/mass, mouth sores, nosebleeds, sore throat, tinnitus, trouble swallowing and voice change.   Eyes: Negative.  Negative for eye problems and icterus.  Respiratory: Negative.  Negative for chest tightness, cough, hemoptysis, shortness of breath and wheezing.   Cardiovascular: Negative.  Negative for chest pain, leg swelling and palpitations.  Gastrointestinal: Negative.  Negative for abdominal distention, abdominal pain, blood in stool, constipation, diarrhea, nausea, rectal pain and vomiting.  Endocrine: Negative.  Negative for hot flashes.  Genitourinary: Negative.  Negative for bladder incontinence, difficulty urinating, dyspareunia, dysuria, frequency, hematuria, menstrual problem, nocturia, pelvic pain, vaginal bleeding and vaginal discharge.   Musculoskeletal:  Positive for arthralgias (in both knees) and myalgias. Negative for back pain, flank pain, gait problem, neck pain and neck stiffness.  Skin:  Negative.  Negative for itching, rash and wound.  Neurological:  Negative for dizziness, extremity weakness, gait problem, headaches, light-headedness, numbness, seizures and speech difficulty.  Hematological: Negative.  Negative for adenopathy. Does not bruise/bleed easily.  Psychiatric/Behavioral: Negative.  Negative for confusion, decreased concentration, depression, sleep disturbance and suicidal ideas. The patient is not nervous/anxious.      VITALS:  There were no vitals taken for this visit.  Wt Readings from Last 3 Encounters:  08/28/22 198 lb 3.2 oz (89.9 kg)  08/20/22 206 lb 8 oz (93.7 kg)  08/07/22 214 lb (97.1 kg)    There is no height or weight on file to calculate BMI.  Performance status (ECOG): 1 -  Symptomatic but completely ambulatory  PHYSICAL EXAM:  Physical Exam Vitals and nursing note reviewed.  Constitutional:      General: She is not in acute distress.    Appearance: Normal appearance. She is normal weight. She is not ill-appearing, toxic-appearing or diaphoretic.  HENT:     Head: Normocephalic and atraumatic.     Right Ear: Tympanic membrane, ear canal and external ear normal. There is no impacted cerumen.     Left Ear: Tympanic membrane and external ear normal. There is no impacted cerumen.     Nose: Nose normal. No congestion or rhinorrhea.     Mouth/Throat:     Mouth: Mucous membranes are moist.     Pharynx: Oropharynx is clear. No oropharyngeal exudate or posterior oropharyngeal erythema.  Eyes:     General: No scleral icterus.       Right eye: No discharge.        Left eye: No discharge.     Extraocular Movements: Extraocular movements intact.     Conjunctiva/sclera: Conjunctivae normal.     Pupils: Pupils are equal, round, and reactive to light.  Neck:     Vascular: No carotid bruit.     Comments: A swelling I the submandibular area measuring 2-3 cm, firm, non tender.  Cardiovascular:     Rate and Rhythm: Normal rate and regular rhythm.      Pulses: Normal pulses.     Heart sounds: Normal heart sounds. No murmur heard.    No friction rub. No gallop.  Pulmonary:     Effort: Pulmonary effort is normal. No respiratory distress.     Breath sounds: Normal breath sounds. No stridor. No wheezing, rhonchi or rales.  Chest:     Chest wall: No tenderness.  Abdominal:     General: Bowel sounds are normal. There is no distension.     Palpations: Abdomen is soft. There is no hepatomegaly, splenomegaly or mass.     Tenderness: There is no abdominal tenderness. There is no right CVA tenderness, left CVA tenderness, guarding or rebound.     Hernia: No hernia is present.  Musculoskeletal:        General: No swelling, tenderness, deformity or signs of injury. Normal range of motion.     Cervical back: Normal range of motion and neck supple. No rigidity or tenderness.     Right lower leg: No edema.     Left lower leg: No edema.  Lymphadenopathy:     Cervical: No cervical adenopathy.     Right cervical: No superficial, deep or posterior cervical adenopathy.    Left cervical: No superficial, deep or posterior cervical adenopathy.     Upper Body:     Right upper body: No supraclavicular, axillary or pectoral adenopathy.     Left upper body: No supraclavicular, axillary or pectoral adenopathy.     Lower Body: No right inguinal adenopathy. No left inguinal adenopathy.  Skin:    General: Skin is warm and dry.     Coloration: Skin is not jaundiced or pale.     Findings: No bruising, erythema, lesion or rash.  Neurological:     General: No focal deficit present.     Mental Status: She is alert and oriented to person, place, and time. Mental status is at baseline.     Cranial Nerves: No cranial nerve deficit.     Sensory: No sensory deficit.     Motor: No weakness.     Coordination: Coordination normal.  Gait: Gait normal.     Deep Tendon Reflexes: Reflexes normal.  Psychiatric:        Mood and Affect: Mood normal.        Behavior:  Behavior normal.        Thought Content: Thought content normal.        Judgment: Judgment normal.    LABS:      Latest Ref Rng & Units 08/28/2022   12:00 AM 08/20/2022   12:00 AM 08/07/2022   12:00 AM  CBC  WBC  43.4     29.1     15.8      Hemoglobin 12.0 - 16.0 8.3     7.0     8.5      Hematocrit 36 - 46 26     22     26       Platelets 150 - 400 K/uL 8     15     28          This result is from an external source.       Latest Ref Rng & Units 08/28/2022   12:00 AM 08/20/2022   12:00 AM 08/07/2022   12:00 AM  CMP  BUN 4 - 21 24     16     16       Creatinine 0.5 - 1.1 1.3     0.8     1.2      Sodium 137 - 147 134     135     134      Potassium 3.5 - 5.1 mEq/L 5.4     4.6     3.9      Chloride 99 - 108 100     102     102      CO2 13 - 22 20     26     25       Calcium 8.7 - 10.7 9.8     9.5     8.9      Alkaline Phos 25 - 125 208     149     119      AST 13 - 35 58     58     49      ALT 7 - 35 U/L 18     14     14          This result is from an external source.    No results found for: "CEA1", "CEA" / No results found for: "CEA1", "CEA" No results found for: "PSA1" No results found for: "ZOX096" No results found for: "CAN125"  No results found for: "TOTALPROTELP", "ALBUMINELP", "A1GS", "A2GS", "BETS", "BETA2SER", "GAMS", "MSPIKE", "SPEI" Lab Results  Component Value Date   TIBC 321 07/09/2022   FERRITIN 790 (H) 07/09/2022   IRONPCTSAT 27 07/09/2022   Lab Results  Component Value Date   LDH 378 (H) 07/09/2022    STUDIES:       HISTORY:   Past Medical History:  Diagnosis Date   Cancer [facility])     Past Surgical History:  Procedure Laterality Date   arm surgery     GALLBLADDER SURGERY     TOTAL KNEE ARTHROPLASTY      Family History  Problem Relation Age of Onset   Cancer Mother    Hypertension Father    Hypertension Sister    Stroke Sister    Hypertension Brother    Stroke Brother  Social History:  reports that she has been smoking  cigarettes. She has a 15.00 pack-year smoking history. She has never used smokeless tobacco. She reports current drug use. No history on file for alcohol use.The patient is alone today.  Allergies:  Allergies  Allergen Reactions   Metronidazole     Other reaction(s): Other (See Comments)   Rituximab     Other reaction(s): Other (See Comments)   Sulfa Antibiotics Rash    Current Medications: Current Outpatient Medications  Medication Sig Dispense Refill   predniSONE (DELTASONE) 10 MG tablet Take 2 tablets (20 mg total) by mouth 2 (two) times daily with a meal. 60 tablet 1   alendronate (FOSAMAX) 70 MG tablet      allopurinol (ZYLOPRIM) 300 MG tablet Take 1 tablet (300 mg total) by mouth daily. 30 tablet 1   amLODipine (NORVASC) 2.5 MG tablet Take 2.5 mg by mouth daily.     Ascorbic Acid (VITAMIN C WITH ROSE HIPS) 500 MG tablet Take 500 mg by mouth daily.     benzonatate (TESSALON) 100 MG capsule TAKE ONE CAPSULE BY MOUTH 3 TIMES A DAY AS NEEDED 60 capsule 5   calcium carbonate (OSCAL) 1500 (600 Ca) MG TABS tablet Take by mouth.     Cholecalciferol (VITAMIN D3) 125 MCG (5000 UT) TABS Take 1 tablet by mouth daily.     diclofenac Sodium (VOLTAREN) 1 % GEL Apply topically.     diphenoxylate-atropine (LOMOTIL) 2.5-0.025 MG tablet Take 1-2 tablets by mouth every 6 (six) hours.     KLOR-CON M20 20 MEQ tablet TAKE 1 TABLET BY MOUTH 3 TIMES DAILY. (Patient taking differently: Take 20 mEq by mouth 3 (three) times daily. Patient voiced currently taking once daily provider aware) 270 tablet 1   lidocaine (LIDODERM) 5 % 2 patches daily.     lidocaine-prilocaine (EMLA) cream Apply 1 Application topically as needed. 30 g 5   Multiple Vitamin (MULTIVITAMIN WITH MINERALS) TABS tablet Take 1 tablet by mouth daily.     Omega-3 Fatty Acids (FISH OIL) 1000 MG CAPS Take 1 capsule by mouth daily.     omeprazole (PRILOSEC) 20 MG capsule TAKE 1 CAPSULE BY MOUTH EVERY DAY 90 capsule 3   oxyCODONE (ROXICODONE) 15  MG immediate release tablet Take 1 tablet (15 mg total) by mouth every 6 (six) hours as needed. 100 tablet 0   pirtobrutinib (JAYPIRCA) 50 MG tablet Take 1 tablet (50 mg total) by mouth daily. 30 tablet 0   prochlorperazine (COMPAZINE) 10 MG tablet TAKE 1 TABLET BY MOUTH EVERY 6 HOURS AS NEEDED 90 tablet 1   vancomycin (VANCOCIN) 125 MG capsule Take by mouth.     No current facility-administered medications for this visit.   Facility-Administered Medications Ordered in Other Visits  Medication Dose Route Frequency Provider Last Rate Last Admin   acetaminophen (TYLENOL) tablet 650 mg  650 mg Oral Once [name], MD       alum & mag hydroxide-simeth (MAALOX/MYLANTA) 200-200-20 MG/5ML suspension 30 mL  30 mL Oral Once [name], MD       diphenhydrAMINE (BENADRYL) capsule 25 mg  25 mg Oral Once [name], MD       influenza vac split quadrivalent PF (FLUARIX) injection 0.5 mL  0.5 mL Intramuscular Once [name], MD       ondansetron [facility]) 4 MG/2ML injection            sodium chloride flush (NS) 0.9 % injection 10 mL  10 mL Intracatheter PRN Belva Crome A, PA-C   10 mL at 09/26/20 9629    I,Jasmine M Lassiter,acting as a scribe for [name], MD.,have documented all relevant documentation on the behalf of [name], MD,as directed by  [name], MD while in the presence of [name], MD.

## 2022-08-29 NOTE — Progress Notes (Signed)
Patient resting quietly- eyes closed, respirations easy

## 2022-08-29 NOTE — Progress Notes (Signed)
Confirmed with Tresa Endo in Blood bank that it is ok to infuse 0 + platelets.

## 2022-08-29 NOTE — Patient Instructions (Signed)
Managing Low Blood Counts During Cancer Treatment Cancer treatments, such as chemotherapy and radiation, may cause a drop in the number of blood cells in the body. These blood cells include red blood cells, white blood cells, and platelets. They are made in the body and sent into the blood to do certain tasks. Red blood cells carry gases such as oxygen and carbon dioxide to and from your lungs. White blood cells help protect you from infection. Platelets help your body form blood clots to prevent and control bleeding. When there is a drop in blood cell counts, your body may not have enough cells to do what it needs to do. This can cause problems. If your blood counts are low, you can take steps to help manage problems. How can low blood counts affect me? The problems you may have from low blood counts will depend on which blood cells are affected. If you have a low number of red blood cells, you have a condition called anemia. This can cause you to feel tired, weak, light-headed, or short of breath. If you have a low number of white blood cells, you may be more at risk for infections. If you have a low number of platelets, you may bleed and bruise easily. Your body may also have trouble stopping any bleeding. How to manage symptoms or prevent problems from a low blood count If you have a low blood count, you can take steps to help decrease symptoms and prevent other problems. The steps you need to take will depend on which type of blood cell is low. Low red blood cells To help manage the symptoms of anemia: Go for a walk or do some light exercise each day. Take short naps during the day. Eat foods with a lot of iron and protein. These include leafy green vegetables, meat and fish, beans, sweet potatoes, and dried fruit. Ask for help with errands and with work that needs to be done around the house. Take vitamins or supplements only as told by your health care provider. Find ways to relax. These  may include yoga or meditation.  Low white blood cells To help prevent infections: Keep your body clean. Make sure you: Wash your hands often with warm water and soap. If you get a scrape or cut, clean it right away. Clean yourself well after you use the bathroom. Tell your provider if you have any rectal sores or bleeding. Avoid contact with pet waste. Wash your hands after handling pets. Do not swim or wade in lakes, ponds, rivers, water parks, or hot tubs. Stay away from crowds of people and any person who has the flu or a fever. To protect yourself, you may need to wear a mask when you are around other people. People should be fever-free for 24 hours before you see them. Avoid dental work that you do not need. Check your mouth each day for sores or signs of infection. Do not share utensils. Avoid fresh flowers and plants or dried flowers. Follow food safety guidelines. Cook meat well and wash all raw fruits and vegetables.  Low platelets To help prevent or control bleeding and bruising: Use an electric razor for shaving instead of a blade. Use a soft toothbrush. Be careful when you clean your mouth and teeth. Ask your cancer care team whether you should avoid flossing. If your mouth is bleeding, rinse it with ice water. Avoid activities that could cause injury, such as contact sports. Talk with your provider about using   stool softeners to avoid constipation and straining during bowel movements. Do not use medicines such as ibuprofen, aspirin, or naproxen unless your provider tells you to. Limit alcohol use. If you drink alcohol: Limit how much you have to: 0-1 drink a day if you are female. 0-2 drinks a day if you are female. Know how much alcohol is in your drink. In the [place] one drink equals one 12 oz bottle of beer (355 mL), one 5 oz glass of wine (148 mL), or one 1 oz glass of hard liquor (44 mL). Monitor any bleeding. If you start bleeding, hold pressure on the area for 5 minutes  to stop the bleeding. Bleeding that does not stop is an emergency. What treatments can help increase a low blood count? Treatment that can help increase a low blood count will depend on the type of blood cell that is low and how severe your condition is. If told by your provider, you may need to: Take medicines to help promote the growth of white or red blood cells. You may also need to take iron, folic acid, or vitamin B12 supplements. Make changes to your diet. You may need more iron and protein. This can help your body make more red blood cells. Adjust your medicines or treatment plan to help raise blood counts. Have a blood transfusion. This may be done if your blood count is very low. Where to find more information [employer]: cancer.org National [employer] Institute: cancer.gov Contact a health care provider if: You feel very tired and weak. You have more bruising or bleeding. You feel ill or get a cough. It hurts when you urinate, or you have blood in your urine or stool. You have pain in your abdomen, or you have diarrhea. You plan to take any new supplements or vitamins or make changes to your diet. You have any signs of an infection such as: Mouth sores or a sore throat. Swelling or redness. Fever or chills. A temperature of 100.4F (38C) or higher when you have low white blood cells. Get help right away if: You are short of breath or feel dizzy. You have bleeding that will not stop. This information is not intended to replace advice given to you by your health care provider. Make sure you discuss any questions you have with your health care provider. Document Revised: 10/10/2021 Document Reviewed: 10/10/2021 [employer] Patient Education  2024 [employer]  

## 2022-08-30 ENCOUNTER — Encounter: Payer: Self-pay | Admitting: Oncology

## 2022-08-30 ENCOUNTER — Other Ambulatory Visit (HOSPITAL_COMMUNITY): Payer: Self-pay

## 2022-08-30 ENCOUNTER — Other Ambulatory Visit: Payer: Self-pay

## 2022-08-30 LAB — BPAM PLATELET PHERESIS
Blood Product Expiration Date: 202405302359
ISSUE DATE / TIME: 202405300728
Unit Type and Rh: 5100

## 2022-08-30 LAB — PREPARE PLATELET PHERESIS

## 2022-09-02 ENCOUNTER — Telehealth: Payer: Self-pay

## 2022-09-02 ENCOUNTER — Encounter: Payer: Self-pay | Admitting: Oncology

## 2022-09-02 ENCOUNTER — Other Ambulatory Visit: Payer: Self-pay

## 2022-09-02 DIAGNOSIS — G8929 Other chronic pain: Secondary | ICD-10-CM

## 2022-09-02 DIAGNOSIS — C9110 Chronic lymphocytic leukemia of B-cell type not having achieved remission: Secondary | ICD-10-CM

## 2022-09-02 DIAGNOSIS — R1084 Generalized abdominal pain: Secondary | ICD-10-CM

## 2022-09-02 MED ORDER — OXYCODONE HCL 15 MG PO TABS
15.0000 mg | ORAL_TABLET | Freq: Four times a day (QID) | ORAL | 0 refills | Status: DC | PRN
Start: 2022-09-02 — End: 2022-11-22

## 2022-09-02 NOTE — Telephone Encounter (Signed)
Pt in ER. The PA requesting to speak with Dr Angelene Giovanni regarding her counts, etc.

## 2022-09-03 ENCOUNTER — Encounter: Payer: Self-pay | Admitting: Oncology

## 2022-09-03 ENCOUNTER — Encounter: Payer: Medicare Other | Admitting: Dietician

## 2022-09-03 DIAGNOSIS — A0472 Enterocolitis due to Clostridium difficile, not specified as recurrent: Secondary | ICD-10-CM | POA: Insufficient documentation

## 2022-09-04 ENCOUNTER — Inpatient Hospital Stay: Payer: No Typology Code available for payment source

## 2022-09-04 ENCOUNTER — Inpatient Hospital Stay: Payer: No Typology Code available for payment source | Admitting: Oncology

## 2022-09-04 DIAGNOSIS — C9110 Chronic lymphocytic leukemia of B-cell type not having achieved remission: Secondary | ICD-10-CM | POA: Diagnosis not present

## 2022-09-05 DIAGNOSIS — C9110 Chronic lymphocytic leukemia of B-cell type not having achieved remission: Secondary | ICD-10-CM | POA: Diagnosis not present

## 2022-09-06 DIAGNOSIS — C9110 Chronic lymphocytic leukemia of B-cell type not having achieved remission: Secondary | ICD-10-CM | POA: Diagnosis not present

## 2022-09-09 DIAGNOSIS — C9110 Chronic lymphocytic leukemia of B-cell type not having achieved remission: Secondary | ICD-10-CM | POA: Diagnosis not present

## 2022-09-10 ENCOUNTER — Encounter: Payer: Self-pay | Admitting: Oncology

## 2022-09-10 DIAGNOSIS — C9110 Chronic lymphocytic leukemia of B-cell type not having achieved remission: Secondary | ICD-10-CM | POA: Diagnosis not present

## 2022-09-11 ENCOUNTER — Inpatient Hospital Stay: Payer: No Typology Code available for payment source | Admitting: Hematology and Oncology

## 2022-09-11 ENCOUNTER — Inpatient Hospital Stay: Payer: No Typology Code available for payment source

## 2022-09-11 DIAGNOSIS — C9110 Chronic lymphocytic leukemia of B-cell type not having achieved remission: Secondary | ICD-10-CM | POA: Diagnosis not present

## 2022-09-12 ENCOUNTER — Inpatient Hospital Stay: Payer: No Typology Code available for payment source

## 2022-09-12 DIAGNOSIS — C9110 Chronic lymphocytic leukemia of B-cell type not having achieved remission: Secondary | ICD-10-CM | POA: Diagnosis not present

## 2022-09-13 DIAGNOSIS — C9110 Chronic lymphocytic leukemia of B-cell type not having achieved remission: Secondary | ICD-10-CM | POA: Diagnosis not present

## 2022-09-16 ENCOUNTER — Encounter: Payer: Self-pay | Admitting: Oncology

## 2022-09-17 ENCOUNTER — Other Ambulatory Visit: Payer: Self-pay

## 2022-09-17 ENCOUNTER — Other Ambulatory Visit: Payer: Self-pay | Admitting: Oncology

## 2022-09-17 ENCOUNTER — Other Ambulatory Visit (HOSPITAL_COMMUNITY): Payer: Self-pay

## 2022-09-17 DIAGNOSIS — C9110 Chronic lymphocytic leukemia of B-cell type not having achieved remission: Secondary | ICD-10-CM

## 2022-09-17 MED ORDER — PIRTOBRUTINIB 100 MG PO TABS
100.0000 mg | ORAL_TABLET | Freq: Every day | ORAL | 0 refills | Status: DC
Start: 2022-09-17 — End: 2022-10-08
  Filled 2022-09-17 – 2022-09-19 (×2): qty 30, 30d supply, fill #0

## 2022-09-18 ENCOUNTER — Other Ambulatory Visit: Payer: Medicare Other

## 2022-09-18 ENCOUNTER — Ambulatory Visit: Payer: Medicare Other | Admitting: Oncology

## 2022-09-18 DIAGNOSIS — C9110 Chronic lymphocytic leukemia of B-cell type not having achieved remission: Secondary | ICD-10-CM | POA: Diagnosis not present

## 2022-09-19 ENCOUNTER — Other Ambulatory Visit: Payer: Self-pay

## 2022-09-20 ENCOUNTER — Other Ambulatory Visit (HOSPITAL_COMMUNITY): Payer: Self-pay

## 2022-09-20 DIAGNOSIS — C9110 Chronic lymphocytic leukemia of B-cell type not having achieved remission: Secondary | ICD-10-CM | POA: Diagnosis not present

## 2022-09-24 ENCOUNTER — Encounter: Payer: Self-pay | Admitting: *Deleted

## 2022-09-24 DIAGNOSIS — C9110 Chronic lymphocytic leukemia of B-cell type not having achieved remission: Secondary | ICD-10-CM | POA: Diagnosis not present

## 2022-09-25 ENCOUNTER — Other Ambulatory Visit: Payer: Medicare Other

## 2022-09-25 ENCOUNTER — Ambulatory Visit: Payer: Medicare Other

## 2022-09-25 LAB — COMPREHENSIVE METABOLIC PANEL: EGFR: 90

## 2022-09-26 ENCOUNTER — Inpatient Hospital Stay: Payer: No Typology Code available for payment source

## 2022-09-26 ENCOUNTER — Telehealth: Payer: Self-pay

## 2022-09-26 ENCOUNTER — Encounter: Payer: Self-pay | Admitting: Oncology

## 2022-09-26 ENCOUNTER — Ambulatory Visit: Payer: Medicare Other | Admitting: Oncology

## 2022-09-26 ENCOUNTER — Inpatient Hospital Stay: Payer: No Typology Code available for payment source | Attending: Hematology and Oncology | Admitting: Oncology

## 2022-09-26 ENCOUNTER — Other Ambulatory Visit: Payer: Medicare Other

## 2022-09-26 VITALS — BP 123/70 | HR 89 | Temp 98.3°F | Resp 18 | Ht 61.5 in | Wt 190.6 lb

## 2022-09-26 DIAGNOSIS — C9110 Chronic lymphocytic leukemia of B-cell type not having achieved remission: Secondary | ICD-10-CM

## 2022-09-26 DIAGNOSIS — D801 Nonfamilial hypogammaglobulinemia: Secondary | ICD-10-CM | POA: Diagnosis not present

## 2022-09-26 DIAGNOSIS — D61818 Other pancytopenia: Secondary | ICD-10-CM

## 2022-09-26 LAB — HEPATIC FUNCTION PANEL
ALT: 17 U/L (ref 7–35)
AST: 22 (ref 13–35)
Alkaline Phosphatase: 117 (ref 25–125)
Bilirubin, Total: 2

## 2022-09-26 LAB — BASIC METABOLIC PANEL
BUN: 10 (ref 4–21)
CO2: 25 — AB (ref 13–22)
Chloride: 107 (ref 99–108)
Creatinine: 0.6 (ref 0.5–1.1)
EGFR: 60
Glucose: 108
Potassium: 3.9 mEq/L (ref 3.5–5.1)
Sodium: 139 (ref 137–147)

## 2022-09-26 LAB — SAMPLE TO BLOOD BANK

## 2022-09-26 LAB — CBC AND DIFFERENTIAL
HCT: 27 — AB (ref 36–46)
Hemoglobin: 9.2 — AB (ref 12.0–16.0)
Neutrophils Absolute: 1.26
Platelets: 12 10*3/uL — AB (ref 150–400)
WBC: 7.9

## 2022-09-26 LAB — COMPREHENSIVE METABOLIC PANEL
Albumin: 3.2 — AB (ref 3.5–5.0)
Calcium: 9 (ref 8.7–10.7)

## 2022-09-26 LAB — CBC: RBC: 3.12 — AB (ref 3.87–5.11)

## 2022-09-26 LAB — BPAM PLATELET PHERESIS: Blood Product Expiration Date: 202406302359

## 2022-09-26 MED ORDER — SODIUM CHLORIDE 0.9% FLUSH
10.0000 mL | INTRAVENOUS | Status: DC | PRN
  Administered 2022-09-26: 10 mL

## 2022-09-26 MED ORDER — HEPARIN SOD (PORK) LOCK FLUSH 100 UNIT/ML IV SOLN
500.0000 [IU] | Freq: Once | INTRAVENOUS | Status: AC | PRN
  Administered 2022-09-26: 500 [IU]

## 2022-09-26 NOTE — Progress Notes (Signed)
[facility]  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  She did not require treatment until 2008 and has since been through multiple therapies.  She has been taking venetoclax 400 mg daily with good control of her disease for over 5 years.  Unfortunately her disease is now progressed with worsening thrombocytopenia and developing lymphadenopathy as well as worsening anemia.  She has now had a bone marrow which still shows CLL with a normal cellular bone marrow of 30% but 20% of the cellularity is her CLL.  Flow cytometry confirms at 78% of the lymphocytes are abnormal CD5/CD19 positive B-cell population.  We do not see evidence of transformation.  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 She was placed on her new chemo pill Pirtobrutinib 50mg  for the next 10 days. She was then increased to 100mg  daily and has been on it for about a week. Her high doses of Prednisone has been slowly tapered and she is now down to 20mg  daily.    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  She will proceed with her monthly IVIG this week.    Thrombocytopenia Her platelet count has slowly declined over the last few months and was low in May at 38,000, down to 26,000, and then down to 8,000. It remained very low during her hospitalization and she required frequent transfusions.  She will be transfused in the morning. She also had some recurring worsening lymphadenopathy of the supraclavicular and cervical areas, and splenomegaly, which is now responding to the Pirtobrutinib. We had placed her on Prednisone 20mg  BID and  this was not effective in helping her blood counts.    Worsening anemia. Her hemoglobin is up to 9.2. She did receive transfusions while in the hospital of 15 units of PRBC's and 14 units of platelets.   Plan:  Samantha Warren has been in the hospital for the past few weeks and was admitted 3 weeks ago for a stroke with left hemiparesis, severe pancytopenia, and was placed on her new chemo pill, Pirtobrutinib 50mg , for the next 10 days. She was then increased to 100mg  daily and has been on it for about a week. Her high doses of Prednisone has been slowly tapered and she is now down to 20mg  daily. This did not help her thrombocytopenia or anemia so not likely immune mediated.  She required almost daily transfusions. She did have neutropenia in the hospital with an ANC as low as 250 but has improved recently. She did have severe hematuria and required bladder irrigation. She was found to have a UTI and this was treated. She has no signs of bleeding from the nose, urine, bowels, or vagina at present. Since then her palpable lymph nodes have decreased and I think her spleen decreased also. Her lymphocytosis improved as well. She is still on Prednisone and I will now taper her dose, decrease it to 15mg  daily. In the hospital her WBC was 7.7, hemoglobin of 8.6, and platelet count was 15,000 on 09/25/2022. She also had a low calcium of 7.8 on that day, despite oral supplementation. Her WBC is 7.9 with an ANC of 1260, hemoglobin is 9.2, and platelet  count of 12,000 as of today. She is still transfusion dependent and I will plan  to transfuse her with 1 plateletpheresis tomorrow at 9 a.m. Her CMP is normal other than a low total protein of 5.6, but improved from her hospital stay. She is in a rehab facility for PT and OT and hopefull that will be a short stay. I will see her back on 10/01/2022 with CBC, CMP, and type and cross match. Calcium is up to 9.0, 9.8 corrected. She has a appointment on July 3rd for a potential  transfusion and IVIG.  She will need visits and labs at least once weekly and possibly twice. I will see her 7/2 with labs.  The patient understands the plans discussed today and is in agreement with them.  I have answered their questions.  She knows to contact our office if she develops concerns prior to her next appointment. Her family members will try to assist with transportation to her appointments.   I provided 32 minutes of face-to-face time during this encounter and > 50% was spent counseling as documented under my assessment and plan.    Samantha Beckwith, MD  [facility] [address] [place] [address] Dept: [phone] Dept Fax: [phone]   Orders Placed This Encounter  Procedures   BLOOD TRANSFUSION REPORT - SCANNED     CHIEF COMPLAINT:  CC: CLL with combined immunodeficiency  Current Treatment: IVIG  HISTORY OF PRESENT ILLNESS:  Samantha Warren is a 77 y.o. 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  She was on observation until November 2008. She was initially treated with chlorambucil and prednisone, as she refused intravenous chemotherapy.  She had a partial response to this regimen.  By January 2010, she had progressive disease with a white count over 200,000, with associated anemia, splenomegaly, thrombocytopenia, and adenopathy.  She then received 5 cycles of fludarabine with good response.  She did well until January 2012, when she had progression once again.  She had a single dose of bendamustine and rituximab, which kept her disease under control for over a year.  However, she had a severe allergic reaction to the rituximab, so has not received further rituximab.  She was hospitalized after the bendamustine because of severe pancytopenia and required multiple transfusions, so was placed on observation.  In March 2013, she had progression of disease again, so  was treated with bendamustine for 6 cycles, at a 50% dose reduction, and once again had a excellent response.  She was on observation until August 2014, then had progression of disease, so was placed on ibrutinib 420 mg daily.  The ibrutinib had kept her disease under fairly good control, but then she develops severe bilateral lower extremity cellulitis in April 2016, so ibrutinib was placed on hold.  She had persistent cellulitis, as well as  Clostridium difficile colitis in May 2016 requiring hospitalization.  She was readmitted soon after discharge with worsening cellulitis, requiring a prolonged hospitalization.  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 She eventually had resolution of the cellulitis   As she was largely asymptomatic, we continued observation until February 2017, at which time she had worsening splenomegaly.  Repeat CT imaging in February showed progressive disease, with marked splenomegaly and bulky lymphadenopathy.  She was then placed back on ibrutinib 420mg  daily.  The lymphocytosis, anemia and  thrombocytopenia slowly improved.  Due to neutropenia, ibrutinib was placed on hold in December 2017.  The ibrutinib was resumed in January 2018, but discontinued in August 2018 due to toxicities, mainly severe abdominal muscle cramping.  She was hospitalized in early September 2018 with urinary tract infection and pancytopenia.  She received multiple red blood cell and platelet transfusions during her stay.  She was admitted again in late September 2018 with perineal cellulitis extending to the lower abdomen and upper thigh, as well as persistent pancytopenia.  CT abdomen and pelvis revealed interval decrease in the abdominal lymphadenopathy, chronic massive splenomegaly with scattered small splenic infarcts, and small left pleural effusion with bibasilar atelectasis.  She required packed red blood cells while  hospitalized.  She was discharged home with home health and [facility]  We continued to follow her closely, but did not place her on a treatment due to the persistent cellulitis.  She then was admitted in October 2018 with severe hypercalcemia, with a calcium of 15.  She was treated with IV fluids, zoledronic acid and Calcitonin injections.  We continued to monitor her closely.  She had recurrent hypercalcemia, for which she received IV fluids and zolendronic acid as an outpatient.     Due to worsening pancytopenia, she underwent bone marrow biopsy in January 2019.   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  The cellulitis had improved to a point we felt she could be treated again, so she was placed on venetoclax CLL in January 2019.  She has continued on venetoclax 400 mg daily after ramp up dosing and has tolerated that fairly well, except for mild diarrhea.  Since starting venetoclax, she has had decreasing splenomegaly and improvement in her pancytopenia.  She initially continued to require IV fluids and zoledronic acid for the hypercalcemia.  Her last dose of zoledronic acid was given in March 2019.  In March, she had an irregular heart rhythm and EKG revealed occasional premature atrial complexes, as well as an incomplete right bundle branch block.  This was stable compared to EKG done in October 2018.  Quantitative immunoglobulins done in August 2019 revealed a normal IgG, with low IgA and IgM.  She has continued IVIG monthly.  She had an episode of Campylobacter diarrhea in November 2019, which was treated with azithromycin for 7 days with resolution of her diarrhea. Venetoclax was held temporarily until her diarrhea resolved.  She has since tolerated venetoclax without significant difficulty.     In February 2022, she had a right diagnostic mammogram due to right breast subareolar tenderness, which did not  reveal any evidence of malignancy.  CT abdomen in April did not reveal any acute abnormality or other findings to explain the left-sided abdominal pain.  There was resolution of previously seen gross splenomegaly.  There were prominent retroperitoneal lymph nodes, which had significantly diminished in size compared to prior exam.  A hiatal hernia was seen. There was descending colonic diverticulosis without evidence of acute diverticulitis.  Bilateral screening mammogram in May 2022 revealed possible asymmetry in the left breast.  Left diagnostic mammogram and ultrasound in June revealed a cluster of cysts/apocrine cyst in the left breast at 2 o'clock, 3 cm the nipple, measuring 6 x 3 x 6 mm, unchanged from the exam dated May 2021. There were no solid masses or suspicious lesions. She is up to date on colonoscopy with her last colonoscopy being in June 2022 with Dr. Jennye Boroughs.  This was negative, and repeat in 10 years was advised.    INTERVAL HISTORY:  Samantha Warren is here today for repeat clinical assessment for CLL with combined immunodeficiency after her bone marrow.  As outlined before, she has been through numerous treatments including chlorambucil, fludarabine, Bendamustine, ibrutinib, and now most recently venetoclax for over 5 years.  She had a prior severe allergic reaction to rituximab. Her bone marrow still shows CLL with a normocellular bone marrow of 30% but 20% of the cellularity is her CLL.  Flow cytometry confirms 78% of the lymphocytes are abnormal CD5/CD19 positive B-cell population. 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 She now has progression of disease with worsening anemia and thrombocytopenia. She has finished a course of oral Vancomycin for her C.dif colitis. [place] was admitted to the hospital 3 weeks ago for a stroke with left hemiparesis, severe pancytopenia, and was placed on her new chemo pill, Pirtobrutinib 50 mg, for the  next 10 days. She was then increased to 100mg  daily and has been on it for about a week. Her high doses of Prednisone has been slowly tapered and she is now down to 20 mg daily.  She required almost daily transfusions. She did have neutropenia in the hospital with an ANC as low as 250 but has improved recently. She did have severe hematuria and required bladder irrigation. She was found to have a UTI and this was treated. She has no signs of bleeding from the nose, urine, bowels, or vagina at present. Patient states that she feels very well now and complains of pain in her legs and a soreness in the middle of her buttocks. Since then her palpable lymph nodes have decreased and I think her spleen has decreased also. Her lymphocytosis improved as well. She is still on Prednisone and I will now taper her dose, decrease it to 15 mg daily. In the hospital her WBC was 7.7, hemoglobin of 8.6, and platelet count was 15,000 on 09/25/2022. She also had a low calcium of 7.8 on that day despite oral supplementation. Her WBC is 7.9 with an ANC of 1260, hemoglobin is 9.2, and platelet count of 12,000 as of today. She is still transfusion dependent and I will plan  to transfuse her with 1 plateletpheresis tomorrow at 9 a.m. Her CMP is normal other than a low total protein of 5.6, but improved from her hospital stay. She is in a rehab facility for PT and OT and hopefully that will be a short stay. I will see her back on 10/01/2022 with CBC, CMP, and type and cross match. She has a appointment on July 3rd for a potential transfusion and she will need visits and labs at least once weekly and possibly twice. She denies signs of infection such as sore throat, sinus drainage, cough, or urinary symptoms.  She denies fevers or recurrent chills. She denies pain. She denies nausea, vomiting, chest pain, dyspnea or cough. Her appetite is good and he5r weight has decreased 8 pounds over last 4 weeks .  REVIEW OF SYSTEMS:  Review of Systems   Constitutional:  Negative for appetite change, chills, diaphoresis, fatigue, fever and unexpected weight change.  HENT:  Negative.  Negative for hearing loss, lump/mass, mouth sores, nosebleeds, sore throat, tinnitus, trouble swallowing and voice change.   Eyes: Negative.  Negative for eye problems and icterus.  Respiratory: Negative.  Negative for chest tightness, cough, hemoptysis, shortness of breath and wheezing.  Cardiovascular: Negative.  Negative for chest pain, leg swelling and palpitations.  Gastrointestinal: Negative.  Negative for abdominal distention, abdominal pain, blood in stool, constipation, diarrhea, nausea, rectal pain and vomiting.  Endocrine: Negative.  Negative for hot flashes.  Genitourinary: Negative.  Negative for bladder incontinence, difficulty urinating, dyspareunia, dysuria, frequency, hematuria, menstrual problem, nocturia, pelvic pain, vaginal bleeding and vaginal discharge.   Musculoskeletal:  Positive for arthralgias (in both knees, legs) and myalgias. Negative for back pain, flank pain, gait problem, neck pain and neck stiffness.       Soreness in the middle of her buttocks.   Skin: Negative.  Negative for itching, rash and wound.  Neurological:  Positive for extremity weakness (left side) and numbness (of her chin). Negative for dizziness, gait problem, headaches, light-headedness, seizures and speech difficulty.  Hematological: Negative.  Negative for adenopathy. Does not bruise/bleed easily.  Psychiatric/Behavioral: Negative.  Negative for confusion, decreased concentration, depression, sleep disturbance and suicidal ideas. The patient is not nervous/anxious.      VITALS:  Blood pressure 123/70, pulse 89, temperature 98.3 F (36.8 C), temperature source Oral, resp. rate 18, height 5' 1.5" (1.562 m), weight 190 lb 9.6 oz (86.5 kg), SpO2 100 %.  Wt Readings from Last 3 Encounters:  10/02/22 192 lb (87.1 kg)  10/01/22 189 lb 4.8 oz (85.9 kg)  09/27/22 192 lb  (87.1 kg)    Body mass index is 35.43 kg/m.  Performance status (ECOG): 1 - Symptomatic but completely ambulatory  PHYSICAL EXAM:  Physical Exam Vitals and nursing note reviewed.  Constitutional:      General: She is not in acute distress.    Appearance: Normal appearance. She is normal weight. She is not ill-appearing, toxic-appearing or diaphoretic.  HENT:     Head: Normocephalic and atraumatic.     Right Ear: Tympanic membrane, ear canal and external ear normal. There is no impacted cerumen.     Left Ear: Tympanic membrane and external ear normal. There is no impacted cerumen.     Nose: Nose normal. No congestion or rhinorrhea.     Mouth/Throat:     Mouth: Mucous membranes are moist.     Pharynx: Oropharynx is clear. No oropharyngeal exudate or posterior oropharyngeal erythema.  Eyes:     General: No scleral icterus.       Right eye: No discharge.        Left eye: No discharge.     Extraocular Movements: Extraocular movements intact.     Conjunctiva/sclera: Conjunctivae normal.     Pupils: Pupils are equal, round, and reactive to light.  Neck:     Vascular: No carotid bruit.  Cardiovascular:     Rate and Rhythm: Normal rate and regular rhythm.     Pulses: Normal pulses.     Heart sounds: Normal heart sounds. No murmur heard.    No friction rub. No gallop.  Pulmonary:     Effort: Pulmonary effort is normal. No respiratory distress.     Breath sounds: Normal breath sounds. No stridor. No wheezing, rhonchi or rales.  Chest:     Chest wall: No tenderness.  Abdominal:     General: Bowel sounds are normal. There is no distension.     Palpations: Abdomen is soft. There is no hepatomegaly, splenomegaly or mass.     Tenderness: There is no abdominal tenderness. There is no right CVA tenderness, left CVA tenderness, guarding or rebound.     Hernia: No hernia is present.     Comments: I  think her spleen has decreased since her hospital stay  Musculoskeletal:        General: No  swelling, tenderness, deformity or signs of injury. Normal range of motion.     Cervical back: Normal range of motion and neck supple. No rigidity or tenderness.     Right lower leg: Edema (mild) present.     Left lower leg: Edema (mild) present.  Lymphadenopathy:     Cervical: No cervical adenopathy.     Right cervical: No superficial, deep or posterior cervical adenopathy.    Left cervical: No superficial, deep or posterior cervical adenopathy.     Upper Body:     Right upper body: Axillary adenopathy present. No supraclavicular or pectoral adenopathy.     Left upper body: Supraclavicular adenopathy present. No axillary or pectoral adenopathy.     Lower Body: No right inguinal adenopathy. No left inguinal adenopathy.     Comments: Her palpable lymph nodes have decreased  Skin:    General: Skin is warm and dry.     Coloration: Skin is not jaundiced or pale.     Findings: No bruising, erythema, lesion or rash.  Neurological:     General: No focal deficit present.     Mental Status: She is alert and oriented to person, place, and time. Mental status is at baseline.     Cranial Nerves: No cranial nerve deficit.     Sensory: No sensory deficit.     Motor: No weakness.     Coordination: Coordination normal.     Gait: Gait normal.     Deep Tendon Reflexes: Reflexes normal.  Psychiatric:        Mood and Affect: Mood normal.        Behavior: Behavior normal.        Thought Content: Thought content normal.        Judgment: Judgment normal.    LABS:      Latest Ref Rng & Units 10/01/2022   12:00 AM 09/26/2022   12:00 AM 08/28/2022   12:00 AM  CBC  WBC  7.1     7.9     43.4      Hemoglobin 12.0 - 16.0 8.8     9.2     8.3      Hematocrit 36 - 46 26     27     26       Platelets 150 - 400 K/uL 16     12     8          This result is from an external source.      Latest Ref Rng & Units 10/01/2022   12:00 AM 09/26/2022   12:00 AM 08/28/2022   12:00 AM  CMP  BUN 4 - 21 17     10     24        Creatinine 0.5 - 1.1 0.7     0.6     1.3      Sodium 137 - 147 139     139     134      Potassium 3.5 - 5.1 mEq/L 4.5     3.9     5.4      Chloride 99 - 108 106     107     100      CO2 13 - 22 23     25     20       Calcium 8.7 - 10.7 9.2  9.0     9.8      Alkaline Phos 25 - 125 116     117     208      AST 13 - 35 21     22     58      ALT 7 - 35 U/L 17     17     18          This result is from an external source.   No results found for: "CEA1", "CEA" / No results found for: "CEA1", "CEA" No results found for: "PSA1" No results found for: "WGN562" No results found for: "CAN125"  No results found for: "TOTALPROTELP", "ALBUMINELP", "A1GS", "A2GS", "BETS", "BETA2SER", "GAMS", "MSPIKE", "SPEI" Lab Results  Component Value Date   TIBC 321 07/09/2022   FERRITIN 790 (H) 07/09/2022   IRONPCTSAT 27 07/09/2022   Lab Results  Component Value Date   LDH 378 (H) 07/09/2022    STUDIES:         HISTORY:   Past Medical History:  Diagnosis Date   Cancer (HCC)     Past Surgical History:  Procedure Laterality Date   arm surgery     GALLBLADDER SURGERY     TOTAL KNEE ARTHROPLASTY      Family History  Problem Relation Age of Onset   Cancer Mother    Hypertension Father    Hypertension Sister    Stroke Sister    Hypertension Brother    Stroke Brother     Social History:  reports that she has been smoking cigarettes. She has a 15.00 pack-year smoking history. She has never used smokeless tobacco. She reports current drug use. No history on file for alcohol use.The patient is alone today.  Allergies:  Allergies  Allergen Reactions   Metronidazole     Other reaction(s): Other (See Comments)   Rituximab     Other reaction(s): Other (See Comments)   Sulfa Antibiotics Rash    Current Medications: Current Outpatient Medications  Medication Sig Dispense Refill   alendronate (FOSAMAX) 70 MG tablet      allopurinol (ZYLOPRIM) 300 MG tablet Take 1 tablet (300 mg  total) by mouth daily. 30 tablet 1   amLODipine (NORVASC) 2.5 MG tablet Take 2.5 mg by mouth daily.     Ascorbic Acid (VITAMIN C WITH ROSE HIPS) 500 MG tablet Take 500 mg by mouth daily.     benzonatate (TESSALON) 100 MG capsule TAKE ONE CAPSULE BY MOUTH 3 TIMES A DAY AS NEEDED 60 capsule 5   calcium carbonate (OSCAL) 1500 (600 Ca) MG TABS tablet Take by mouth.     Cholecalciferol (VITAMIN D3) 125 MCG (5000 UT) TABS Take 1 tablet by mouth daily.     diclofenac Sodium (VOLTAREN) 1 % GEL Apply topically.     diphenoxylate-atropine (LOMOTIL) 2.5-0.025 MG tablet Take 1-2 tablets by mouth every 6 (six) hours.     KLOR-CON M20 20 MEQ tablet TAKE 1 TABLET BY MOUTH 3 TIMES DAILY. (Patient taking differently: Take 20 mEq by mouth 3 (three) times daily. Patient voiced currently taking once daily provider aware) 270 tablet 1   lidocaine (LIDODERM) 5 % 2 patches daily.     lidocaine-prilocaine (EMLA) cream Apply 1 Application topically as needed. 30 g 5   Multiple Vitamin (MULTIVITAMIN WITH MINERALS) TABS tablet Take 1 tablet by mouth daily.     Omega-3 Fatty Acids (FISH OIL) 1000 MG CAPS Take 1 capsule by mouth daily.  omeprazole (PRILOSEC) 20 MG capsule TAKE 1 CAPSULE BY MOUTH EVERY DAY 90 capsule 3   oxyCODONE (ROXICODONE) 15 MG immediate release tablet Take 1 tablet (15 mg total) by mouth every 6 (six) hours as needed. 100 tablet 0   pirtobrutinib (JAYPIRCA) 100 MG tablet Take 1 tablet (100 mg total) by mouth daily. 30 tablet 0   predniSONE (DELTASONE) 10 MG tablet Take 2 tablets (20 mg total) by mouth 2 (two) times daily with a meal. (Patient taking differently: Take 15 mg by mouth daily with breakfast.) 60 tablet 1   prochlorperazine (COMPAZINE) 10 MG tablet TAKE 1 TABLET BY MOUTH EVERY 6 HOURS AS NEEDED 90 tablet 1   Current Facility-Administered Medications  Medication Dose Route Frequency Provider Last Rate Last Admin   sodium chloride flush (NS) 0.9 % injection 10 mL  10 mL Intracatheter PRN  Samantha Beckwith, MD   10 mL at 09/26/22 1610   Facility-Administered Medications Ordered in Other Visits  Medication Dose Route Frequency Provider Last Rate Last Admin   alum & mag hydroxide-simeth (MAALOX/MYLANTA) 200-200-20 MG/5ML suspension 30 mL  30 mL Oral Once Samantha Beckwith, MD       influenza vac split quadrivalent PF (FLUARIX) injection 0.5 mL  0.5 mL Intramuscular Once Samantha Beckwith, MD       sodium chloride flush (NS) 0.9 % injection 10 mL  10 mL Intracatheter PRN Mosher, Kelli A, PA-C   10 mL at 09/26/20 0839   sodium chloride flush (NS) 0.9 % injection 10 mL  10 mL Intracatheter PRN Samantha Beckwith, MD   10 mL at 10/01/22 1124    I,Jasmine M Lassiter,acting as a scribe for Samantha Beckwith, MD.,have documented all relevant documentation on the behalf of Samantha Beckwith, MD,as directed by  Samantha Beckwith, MD while in the presence of Samantha Beckwith, MD.

## 2022-09-26 NOTE — Telephone Encounter (Signed)
CRITICAL VALUE STICKER  CRITICAL VALUE:platelet 12  RECEIVER (on-site recipient of call):Katrina, RN  DATE & TIME NOTIFIED: 09/26/22 at 0921  MESSENGER (representative from lab):Scottie, [facility] lab  MD NOTIFIED: Dr. Gilman Buttner   TIME OF NOTIFICATION:0922   RESPONSE:  Transfuse 1 units platelets

## 2022-09-26 NOTE — Telephone Encounter (Signed)
Pt in clinic this morning to have labs drawn and see Dr Gilman Buttner since hospital discharge a couple of days ago. Pt looks good. She is in w/c, in no acute distress. Pt is presently residing @ [facility] (formerly [facility]). She is taking the Pirtobrutinib 100mg  every day, which was started on 6/18 (initially started on 50mg  po every day while in hospital). No missed doses, as she was given the medication by nursing staff daily. 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 She also has a pressure sore on her bottom, that she states she had before going into the hospital. She has lost down to 190 pounds since last visit (8# wt loss since 08/28/2022, total of 16# weight loss since 08/19/2021). Dr Gilman Buttner aware of all above.

## 2022-09-27 ENCOUNTER — Ambulatory Visit: Payer: Medicare Other

## 2022-09-27 ENCOUNTER — Inpatient Hospital Stay: Payer: No Typology Code available for payment source

## 2022-09-27 DIAGNOSIS — C9110 Chronic lymphocytic leukemia of B-cell type not having achieved remission: Secondary | ICD-10-CM | POA: Diagnosis not present

## 2022-09-27 LAB — BPAM PLATELET PHERESIS
Blood Product Expiration Date: 202406302359
ISSUE DATE / TIME: 202406280727

## 2022-09-27 LAB — PREPARE PLATELET PHERESIS

## 2022-09-27 MED ORDER — SODIUM CHLORIDE 0.9% IV SOLUTION: 250.0000 mL | Freq: Once | INTRAVENOUS | Status: DC

## 2022-09-27 MED ORDER — HEPARIN SOD (PORK) LOCK FLUSH 100 UNIT/ML IV SOLN: 500.0000 [IU] | Freq: Every day | INTRAVENOUS | Status: DC | PRN

## 2022-09-27 MED ORDER — DIPHENHYDRAMINE HCL 25 MG PO CAPS
25.0000 mg | ORAL_CAPSULE | Freq: Once | ORAL | Status: AC
  Administered 2022-09-27: 25 mg via ORAL
  Filled 2022-09-27: qty 1

## 2022-09-27 MED ORDER — ACETAMINOPHEN 325 MG PO TABS
650.0000 mg | ORAL_TABLET | Freq: Once | ORAL | Status: AC
  Administered 2022-09-27: 650 mg via ORAL
  Filled 2022-09-27: qty 2

## 2022-09-27 MED ORDER — SODIUM CHLORIDE 0.9% FLUSH: 10.0000 mL | INTRAVENOUS | Status: DC | PRN

## 2022-09-27 NOTE — Patient Instructions (Signed)
Blood Transfusion, Adult, Care After The following information offers guidance on how to care for yourself after your procedure. Your health care provider may also give you more specific instructions. If you have problems or questions, contact your health care provider. What can I expect after the procedure? After the procedure, it is common to have: Bruising and soreness where the IV was inserted. A headache. Follow these instructions at home: IV insertion site care     Follow instructions from your health care provider about how to take care of your IV insertion site. Make sure you: Wash your hands with soap and water for at least 20 seconds before and after you change your bandage (dressing). If soap and water are not available, use hand sanitizer. Change your dressing as told by your health care provider. Check your IV insertion site every day for signs of infection. Check for: Redness, swelling, or pain. Bleeding from the site. Warmth. Pus or a bad smell. General instructions Take over-the-counter and prescription medicines only as told by your health care provider. Rest as told by your health care provider. Return to your normal activities as told by your health care provider. Keep all follow-up visits. Lab tests may need to be done at certain periods to recheck your blood counts. Contact a health care provider if: You have itching or red, swollen areas of skin (hives). You have a fever or chills. You have pain in the head, back, or chest. You feel anxious or you feel weak after doing your normal activities. You have redness, swelling, warmth, or pain around the IV insertion site. You have blood coming from the IV insertion site that does not stop with pressure. You have pus or a bad smell coming from your IV insertion site. If you received your blood transfusion in an outpatient setting, you will be told whom to contact to report any reactions. Get help right away if: You  have symptoms of a serious allergic or immune system reaction, including: Trouble breathing or shortness of breath. Swelling of the face, feeling flushed, or widespread rash. Dark urine or blood in the urine. Fast heartbeat. These symptoms may be an emergency. Get help right away. Call 911. Do not wait to see if the symptoms will go away. Do not drive yourself to the hospital. Summary Bruising and soreness around the IV insertion site are common. Check your IV insertion site every day for signs of infection. Rest as told by your health care provider. Return to your normal activities as told by your health care provider. Get help right away for symptoms of a serious allergic or immune system reaction to the blood transfusion. This information is not intended to replace advice given to you by your health care provider. Make sure you discuss any questions you have with your health care provider. Document Revised: 06/15/2021 Document Reviewed: 06/15/2021 Elsevier Patient Education  2024 [employer]  

## 2022-09-30 LAB — BPAM PLATELET PHERESIS

## 2022-09-30 LAB — PREPARE PLATELET PHERESIS: Unit division: 0

## 2022-10-01 ENCOUNTER — Inpatient Hospital Stay (INDEPENDENT_AMBULATORY_CARE_PROVIDER_SITE_OTHER): Payer: Medicare Other | Admitting: Oncology

## 2022-10-01 ENCOUNTER — Telehealth: Payer: Self-pay

## 2022-10-01 ENCOUNTER — Encounter: Payer: Self-pay | Admitting: Oncology

## 2022-10-01 ENCOUNTER — Other Ambulatory Visit: Payer: Self-pay | Admitting: Pharmacist

## 2022-10-01 ENCOUNTER — Inpatient Hospital Stay: Payer: Medicare Other | Attending: Hematology and Oncology

## 2022-10-01 VITALS — BP 106/63 | HR 102 | Temp 98.2°F | Resp 18 | Ht 61.5 in | Wt 189.3 lb

## 2022-10-01 DIAGNOSIS — D61818 Other pancytopenia: Secondary | ICD-10-CM

## 2022-10-01 DIAGNOSIS — D819 Combined immunodeficiency, unspecified: Secondary | ICD-10-CM | POA: Insufficient documentation

## 2022-10-01 DIAGNOSIS — D801 Nonfamilial hypogammaglobulinemia: Secondary | ICD-10-CM

## 2022-10-01 DIAGNOSIS — D696 Thrombocytopenia, unspecified: Secondary | ICD-10-CM | POA: Insufficient documentation

## 2022-10-01 DIAGNOSIS — Z8673 Personal history of transient ischemic attack (TIA), and cerebral infarction without residual deficits: Secondary | ICD-10-CM | POA: Insufficient documentation

## 2022-10-01 DIAGNOSIS — C9110 Chronic lymphocytic leukemia of B-cell type not having achieved remission: Secondary | ICD-10-CM | POA: Insufficient documentation

## 2022-10-01 DIAGNOSIS — D630 Anemia in neoplastic disease: Secondary | ICD-10-CM | POA: Diagnosis not present

## 2022-10-01 LAB — HEPATIC FUNCTION PANEL
ALT: 17 U/L (ref 7–35)
AST: 21 (ref 13–35)
Alkaline Phosphatase: 116 (ref 25–125)
Bilirubin, Total: 1.6

## 2022-10-01 LAB — BPAM PLATELET PHERESIS: Unit Type and Rh: 7300

## 2022-10-01 LAB — CBC AND DIFFERENTIAL
HCT: 26 — AB (ref 36–46)
Hemoglobin: 8.8 — AB (ref 12.0–16.0)
Neutrophils Absolute: 1.78
Platelets: 16 10*3/uL — AB (ref 150–400)
WBC: 7.1

## 2022-10-01 LAB — BASIC METABOLIC PANEL
BUN: 17 (ref 4–21)
CO2: 23 — AB (ref 13–22)
Chloride: 106 (ref 99–108)
Creatinine: 0.7 (ref 0.5–1.1)
Glucose: 98
Potassium: 4.5 mEq/L (ref 3.5–5.1)
Sodium: 139 (ref 137–147)

## 2022-10-01 LAB — COMPREHENSIVE METABOLIC PANEL
Albumin: 3.6 (ref 3.5–5.0)
Calcium: 9.2 (ref 8.7–10.7)

## 2022-10-01 LAB — CBC: RBC: 2.97 — AB (ref 3.87–5.11)

## 2022-10-01 LAB — PREPARE PLATELET PHERESIS: Unit division: 0

## 2022-10-01 MED ORDER — HEPARIN SOD (PORK) LOCK FLUSH 100 UNIT/ML IV SOLN
500.0000 [IU] | Freq: Once | INTRAVENOUS | Status: AC | PRN
  Administered 2022-10-01: 500 [IU]

## 2022-10-01 MED ORDER — SODIUM CHLORIDE 0.9% FLUSH
10.0000 mL | INTRAVENOUS | Status: DC | PRN
  Administered 2022-10-01: 10 mL

## 2022-10-01 MED FILL — Immune Globulin (Human) IV Soln 10 GM/100ML: INTRAVENOUS | Qty: 450 | Status: AC

## 2022-10-01 NOTE — Telephone Encounter (Signed)
-----   Message from [name], MD sent at 10/01/2022 11:46 AM EDT ----- Regarding: labs Pls notify SNF that she does not need labs drawn there anymore, we will do here weekly  Rec tx of 1 plateletpheresis tomorrow (has appt) Will not recheck labs later this week unless she has bleeding  Will plan next IVIG on 7/12

## 2022-10-01 NOTE — Progress Notes (Addendum)
[facility]  [address] 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  She did not require treatment until 2008 and has since been through multiple therapies.  She has been taking venetoclax 400 mg daily with good control of her disease for over 5 years.  Unfortunately her disease is now progressed with worsening thrombocytopenia and developing lymphadenopathy as well as worsening anemia.  She has now had a bone marrow which still shows CLL with a normal cellular bone marrow of 30% but 20% of the cellularity is her CLL.  Flow cytometry confirms at 78% of the lymphocytes are abnormal CD5/CD19 positive B-cell population.  We do not see evidence of transformation.  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 Venetoclax was stopped and she is now on pirtobrutinib 100 mg twice daily.   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  She will proceed with her monthly IVIG tomorrow.   Thrombocytopenia Her platelet count has slowly declined over the last few months and was 38,000, then down to 26,000, and now down to 8,000. She ended up hospitalized with a stroke and was transfusion dependent almost daily. I did put her on a trial of steroids and she had no response of the platelets or red cells. We are now tapering that dose and she is down to 20mg  daily. She was placed on Pirtobrutinib initially at 50 mg daily and then increased to 100mg  daily, she is tolerating that well. She had severe hematuria which finally resolved but required continuous bladder irrigation. She was finally discharged to a rehab facility and is making  progress with PT. She does have improvement in her adenopathy of the supraclavicular and cervical areas, and splenomegaly. We will plan weekly monitoring of her blood.   Worsening anemia. Her hemoglobin is up to 8.8 now so she will not receive RBC's. She did receive transfusion while in the hospital of PRBC's and platelets  CVA She had a stroke in June with numbness of her left chin and left hemiparesis. Her neurologic deficit is steadily improving and she is now in rehab.  Plan:  I recommended that we limit the blood tests to be done during her appointments once a week. She has been walking with her walker and says she has been steady. She is presently going for physical therapy and I recommend that she continue.  She was then placed on Pirtobrutinib in June, 2024 and she is tolerating this well and I think she is responding based on the decreased adenopathy. She is on 100 mg daily and we plan to increase to 200 mg daily later this month. I will also plan to taper the dose of her prednisone. She will be due to decrease the dose next week. Her hemoglobin is 8.8, WBC is 7.1 and platelet count is 16,000 today. Her CMP is pending. I scheduled her monthly IVIG and one plateletpheresis tomorrow, 10/02/2022. I will see Her back in 1 week with CBC, CMP, and type and cross match. The patient understand the plans discussed today and are in agreement with them.  I have answered their questions.  She knows to contact our office if she develops concerns prior to  her next appointment.  I provided 30 minutes of face-to-face time during this encounter and > 50% was spent counseling as documented under my assessment and plan.    Samantha Beckwith, MD  [facility] [address] [place] [address] Dept: [phone] Dept Fax: [phone]   No orders of the defined types were placed in this encounter.    CHIEF COMPLAINT:  CC: CLL with  combined immunodeficiency  Current Treatment: IVIG  HISTORY OF PRESENT ILLNESS:  Samantha Warren is a 77 y.o. 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  She was on observation until November 2008. She was initially treated with chlorambucil and prednisone, as she refused intravenous chemotherapy.  She had a partial response to this regimen.  By January 2010, she had progressive disease with a white count over 200,000, with associated anemia, splenomegaly, thrombocytopenia, and adenopathy.  She then received 5 cycles of fludarabine with good response.  She did well until January 2012, when she had progression once again.  She had a single dose of bendamustine and rituximab, which kept her disease under control for over a year.  However, she had a severe allergic reaction to the rituximab, so has not received further rituximab.  She was hospitalized after the bendamustine because of severe pancytopenia and required multiple transfusions, so was placed on observation.  In March 2013, she had progression of disease again, so was treated with bendamustine for 6 cycles, at a 50% dose reduction, and once again had a excellent response.  She was on observation until August 2014, then had progression of disease, so was placed on ibrutinib 420 mg daily.  The ibrutinib had kept her disease under fairly good control, but then she develops severe bilateral lower extremity cellulitis in April 2016, so ibrutinib was placed on hold.  She had persistent cellulitis, as well as  Clostridium difficile colitis in May 2016 requiring hospitalization.  She was readmitted soon after discharge with worsening cellulitis, requiring a prolonged hospitalization.  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 She eventually had resolution of the cellulitis   As she was largely asymptomatic, we continued observation  until February 2017, at which time she had worsening splenomegaly.  Repeat CT imaging in February showed progressive disease, with marked splenomegaly and bulky lymphadenopathy.  She was then placed back on ibrutinib 420mg  daily.  The lymphocytosis, anemia and thrombocytopenia slowly improved.  Due to neutropenia, ibrutinib was placed on hold in December 2017.  The ibrutinib was resumed in January 2018, but discontinued in August 2018 due to toxicities, mainly severe abdominal muscle cramping.  She was hospitalized in early September 2018 with urinary tract infection and pancytopenia.  She received multiple red blood cell and platelet transfusions during her stay.  She was admitted again in late September 2018 with perineal cellulitis extending to the lower abdomen and upper thigh, as well as persistent pancytopenia.  CT abdomen and pelvis revealed interval decrease in the abdominal lymphadenopathy, chronic massive splenomegaly with scattered small splenic infarcts, and small left pleural effusion with bibasilar atelectasis.  She required packed red blood cells while hospitalized.  She was discharged home with home health and [facility]  We continued to follow her closely, but did not place her on a treatment due to the persistent cellulitis.  She then was admitted in October  2018 with severe hypercalcemia, with a calcium of 15.  She was treated with IV fluids, zoledronic acid and Calcitonin injections.  We continued to monitor her closely.  She had recurrent hypercalcemia, for which she received IV fluids and zolendronic acid as an outpatient.     Due to worsening pancytopenia, she underwent bone marrow biopsy in January 2019.   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  The cellulitis had improved to a point we felt she could be treated again, so she was placed on venetoclax CLL in January 2019.  She has continued on  venetoclax 400 mg daily after ramp up dosing and has tolerated that fairly well, except for mild diarrhea.  Since starting venetoclax, she has had decreasing splenomegaly and improvement in her pancytopenia.  She initially continued to require IV fluids and zoledronic acid for the hypercalcemia.  Her last dose of zoledronic acid was given in March 2019.  In March, she had an irregular heart rhythm and EKG revealed occasional premature atrial complexes, as well as an incomplete right bundle branch block.  This was stable compared to EKG done in October 2018.  Quantitative immunoglobulins done in August 2019 revealed a normal IgG, with low IgA and IgM.  She has continued IVIG monthly.  She had an episode of Campylobacter diarrhea in November 2019, which was treated with azithromycin for 7 days with resolution of her diarrhea. Venetoclax was held temporarily until her diarrhea resolved.  She has since tolerated venetoclax without significant difficulty.     In February 2022, she had a right diagnostic mammogram due to right breast subareolar tenderness, which did not reveal any evidence of malignancy.  CT abdomen in April did not reveal any acute abnormality or other findings to explain the left-sided abdominal pain.  There was resolution of previously seen gross splenomegaly.  There were prominent retroperitoneal lymph nodes, which had significantly diminished in size compared to prior exam.  A hiatal hernia was seen. There was descending colonic diverticulosis without evidence of acute diverticulitis.  Bilateral screening mammogram in May 2022 revealed possible asymmetry in the left breast.  Left diagnostic mammogram and ultrasound in June revealed a cluster of cysts/apocrine cyst in the left breast at 2 o'clock, 3 cm the nipple, measuring 6 x 3 x 6 mm, unchanged from the exam dated May 2021. There were no solid masses or suspicious lesions. She is up to date on colonoscopy with her last colonoscopy being in June  2022 with Dr. Jennye Boroughs.  This was negative, and repeat in 10 years was advised.    INTERVAL HISTORY:  Samantha Warren is here today for repeat clinical assessment for CLL with combined immunodeficiency after her bone marrow.  As outlined before, she has been through numerous treatments including chlorambucil, fludarabine, Bendamustine, ibrutinib, and now most recently venetoclax for over 5 years.  She had a prior severe allergic reaction to rituximab. Her bone marrow still shows CLL with a normocellular bone marrow of 30% but 20% of the cellularity is her CLL.  Flow cytometry confirms 78% of the lymphocytes are abnormal CD5/CD19 positive B-cell population. 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 Venetoclax was stopped. She now has progression of disease with worsening anemia and thrombocytopenia. She took a course of oral Vancomycin for her C.diff colitis in May, 2024. She was then placed on Pirtobrutinib in June, 2024. She is tolerating this well and I think she is  responding based on the decreased adenopathy. She is on 100 mg daily and we plan to increase to 200 mg daily later this month. The patient states that she feels well and complains of having her blood drawn frequently.  I recommended that we limit the blood tests to be done during her appointments once a week. She has been walking with her walker and says she has been steady. She is presently going for physical therapy and I recommend that she continue.  She was then placed on Pirtobrutinib in June, 2024. She is tolerating this well and I think she is responding based on the decreased adenopathy. She is on 100 mg daily and we plan to increase to 200 mg daily later this month. Her hemoglobin is 8.8, WBC is 7.1 and platelet count is 16,000 today. Her CMP is pending. I scheduled her monthly IVIG and 1 plateletpheresis tomorrow, 10/02/2022. I will see Her back in 1 week with CBC, CMP, and type and  cross match. She  denies signs of infections such as sore throat, sinus drainage, cough or urinary symptoms. She  denies fever or recurrent chills. She  also deny nausea, vomiting, chest pain dyspnea or cough. Her  appetite is good and Her  weight has decreased 1 pounds over last 1 week     REVIEW OF SYSTEMS:  Review of Systems  Constitutional:  Negative for appetite change, chills, diaphoresis, fatigue, fever and unexpected weight change.  HENT:  Negative.  Negative for hearing loss, lump/mass, mouth sores, nosebleeds, sore throat, tinnitus, trouble swallowing and voice change.   Eyes: Negative.  Negative for eye problems and icterus.  Respiratory: Negative.  Negative for chest tightness, cough, hemoptysis, shortness of breath and wheezing.   Cardiovascular: Negative.  Negative for chest pain, leg swelling and palpitations.  Gastrointestinal: Negative.  Negative for abdominal distention, abdominal pain, blood in stool, constipation, diarrhea, nausea, rectal pain and vomiting.  Endocrine: Negative.  Negative for hot flashes.  Genitourinary: Negative.  Negative for bladder incontinence, difficulty urinating, dyspareunia, dysuria, frequency, hematuria, menstrual problem, nocturia, pelvic pain, vaginal bleeding and vaginal discharge.   Musculoskeletal:  Positive for arthralgias (in both knees, legs). Negative for back pain, flank pain, gait problem, myalgias, neck pain and neck stiffness.  Skin: Negative.  Negative for itching, rash and wound.  Neurological:  Negative for dizziness, extremity weakness, gait problem, headaches, light-headedness, numbness, seizures and speech difficulty.  Hematological: Negative.  Negative for adenopathy. Does not bruise/bleed easily.  Psychiatric/Behavioral: Negative.  Negative for confusion, decreased concentration, depression, sleep disturbance and suicidal ideas. The patient is not nervous/anxious.      VITALS:  Blood pressure 106/63, pulse (!) 102, temperature  98.2 F (36.8 C), temperature source Oral, resp. rate 18, height 5' 1.5" (1.562 m), weight 189 lb 4.8 oz (85.9 kg), SpO2 100%.  Wt Readings from Last 3 Encounters:  10/08/22 193 lb 9.6 oz (87.8 kg)  10/02/22 192 lb (87.1 kg)  10/01/22 189 lb 4.8 oz (85.9 kg)    Body mass index is 35.19 kg/m.  Performance status (ECOG): 1 - Symptomatic but completely ambulatory  PHYSICAL EXAM:  Physical Exam Vitals and nursing note reviewed.  Constitutional:      General: She is not in acute distress.    Appearance: Normal appearance. She is normal weight. She is not ill-appearing, toxic-appearing or diaphoretic.  HENT:     Head: Normocephalic and atraumatic.     Right Ear: Tympanic membrane, ear canal and external ear normal. There is no impacted  cerumen.     Left Ear: Tympanic membrane and external ear normal. There is no impacted cerumen.     Nose: Nose normal. No congestion or rhinorrhea.     Mouth/Throat:     Mouth: Mucous membranes are moist.     Pharynx: Oropharynx is clear. No oropharyngeal exudate or posterior oropharyngeal erythema.  Eyes:     General: No scleral icterus.       Right eye: No discharge.        Left eye: No discharge.     Extraocular Movements: Extraocular movements intact.     Conjunctiva/sclera: Conjunctivae normal.     Pupils: Pupils are equal, round, and reactive to light.  Neck:     Vascular: No carotid bruit.  Cardiovascular:     Rate and Rhythm: Normal rate and regular rhythm.     Pulses: Normal pulses.     Heart sounds: Normal heart sounds. No murmur heard.    No friction rub. No gallop.  Pulmonary:     Effort: Pulmonary effort is normal. No respiratory distress.     Breath sounds: Normal breath sounds. No stridor. No wheezing, rhonchi or rales.  Chest:     Chest wall: No tenderness.  Abdominal:     General: Bowel sounds are normal. There is no distension.     Palpations: Abdomen is soft. There is splenomegaly. There is no hepatomegaly or mass.      Tenderness: There is no abdominal tenderness. There is no right CVA tenderness, left CVA tenderness, guarding or rebound.     Hernia: No hernia is present.     Comments: The spleen is 10 cm below the left costal margin.  Musculoskeletal:        General: No swelling, tenderness, deformity or signs of injury. Normal range of motion.     Cervical back: Normal range of motion and neck supple. No rigidity or tenderness.     Right lower leg: No edema.     Left lower leg: No edema.  Lymphadenopathy:     Cervical: No cervical adenopathy.     Right cervical: No superficial, deep or posterior cervical adenopathy.    Left cervical: No superficial, deep or posterior cervical adenopathy.     Upper Body:     Right upper body: No supraclavicular, axillary or pectoral adenopathy.     Left upper body: Axillary adenopathy present. No supraclavicular or pectoral adenopathy.     Lower Body: No right inguinal adenopathy. No left inguinal adenopathy.     Comments: Trace of lymph node in the left axilla  Skin:    General: Skin is warm and dry.     Coloration: Skin is not jaundiced or pale.     Findings: Ecchymosis (Excessive ecchymosis of the left anti cubital fossa) present. No bruising, erythema, lesion or rash.  Neurological:     General: No focal deficit present.     Mental Status: She is alert and oriented to person, place, and time. Mental status is at baseline.     Cranial Nerves: No cranial nerve deficit.     Sensory: No sensory deficit.     Motor: No weakness.     Coordination: Coordination normal.     Gait: Gait normal.     Deep Tendon Reflexes: Reflexes normal.  Psychiatric:        Mood and Affect: Mood normal.        Behavior: Behavior normal.        Thought Content: Thought content normal.  Judgment: Judgment normal.    LABS:      Latest Ref Rng & Units 10/08/2022   12:00 AM 10/01/2022   12:00 AM 09/26/2022   12:00 AM  CBC  WBC  6.9     7.1     7.9      Hemoglobin 12.0 - 16.0 7.8      8.8     9.2      Hematocrit 36 - 46 23     26     27       Platelets 150 - 400 K/uL 19     16     12          This result is from an external source.      Latest Ref Rng & Units 10/08/2022   12:00 AM 10/01/2022   12:00 AM 09/26/2022   12:00 AM  CMP  BUN 4 - 21 23     17     10       Creatinine 0.5 - 1.1 0.8     0.7     0.6      Sodium 137 - 147 138     139     139      Potassium 3.5 - 5.1 mEq/L 4.0     4.5     3.9      Chloride 99 - 108 106     106     107      CO2 13 - 22 23     23     25       Calcium 8.7 - 10.7 8.8     9.2     9.0      Alkaline Phos 25 - 125 88     116     117      AST 13 - 35 25     21     22       ALT 7 - 35 U/L 17     17     17          This result is from an external source.            Component Ref Range & Units 5 d ago (09/26/22) 1 mo ago (08/28/22) 1 mo ago (08/20/22) 1 mo ago (08/07/22) 2 mo ago (08/02/22) 2 mo ago (07/18/22) 2 mo ago (07/09/22)  Calcium 8.7 - 10.7 9.0 9.8 9.5 8.9 9.1 8.5 Low  R 8.7  Albumin 3.5 - 5.0 3.2 Abnormal  3.9 3.9 4.1 3.9 3.8 R 4.0     No results found for: "CEA1", "CEA" / No results found for: "CEA1", "CEA" No results found for: "PSA1" No results found for: "FAO130" No results found for: "CAN125"  No results found for: "TOTALPROTELP", "ALBUMINELP", "A1GS", "A2GS", "BETS", "BETA2SER", "GAMS", "MSPIKE", "SPEI" Lab Results  Component Value Date   TIBC 321 07/09/2022   FERRITIN 790 (H) 07/09/2022   IRONPCTSAT 27 07/09/2022   Lab Results  Component Value Date   LDH 378 (H) 07/09/2022    STUDIES:                     HISTORY:   Past Medical History:  Diagnosis Date   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 She has a 15 pack-year smoking history. She has never used smokeless tobacco. She reports current drug use. No history on file for alcohol use.The patient is alone today.  Allergies:  Allergies  Allergen Reactions   Metronidazole     Other reaction(s): Other (See Comments)   Rituximab     Other reaction(s): Other (See Comments)   Sulfa Antibiotics Rash    Current Medications: Current Outpatient Medications  Medication Sig Dispense Refill   alendronate (FOSAMAX) 70 MG tablet      allopurinol (ZYLOPRIM) 300 MG tablet Take 1 tablet (300 mg total) by mouth daily. 30 tablet 1   amLODipine (NORVASC) 2.5 MG tablet Take 2.5 mg by mouth daily.     Ascorbic Acid (VITAMIN C WITH ROSE HIPS) 500 MG tablet Take 500 mg by mouth daily.     atorvastatin (LIPITOR) 40 MG tablet Take 40 mg by mouth daily.     calcium carbonate (TUMS - DOSED IN MG ELEMENTAL CALCIUM) 500 MG chewable tablet Chew 1 tablet by mouth 3 (three) times daily.     collagenase (SANTYL) 250 UNIT/GM ointment Apply 1 Application topically daily.     Ergocalciferol 50 MCG (2000 UT) CAPS Take 50 Capfuls by mouth daily.     KLOR-CON M20 20 MEQ tablet Take 1 tablet (20 mEq total) by mouth daily. Patient voiced currently taking once daily provider aware 90 tablet 5   Multiple Vitamin (MULTIVITAMIN WITH MINERALS) TABS tablet Take 1 tablet by mouth daily.     omeprazole (PRILOSEC) 20 MG capsule TAKE 1 CAPSULE BY MOUTH EVERY DAY 90 capsule 3   oxyCODONE (ROXICODONE) 15 MG immediate release tablet Take 1 tablet (15 mg total) by mouth every 6 (six) hours as needed. 100 tablet 0   pirtobrutinib (JAYPIRCA) 100 MG tablet Take 2 tablets (200 mg total) by mouth daily. 60 tablet 5   predniSONE (DELTASONE) 10 MG tablet Take 2 tablets (20 mg total) by mouth 2 (two) times daily with a meal. (Patient taking  differently: Take 15 mg by mouth daily with breakfast.) 60 tablet 1   Current Facility-Administered Medications  Medication Dose Route Frequency Provider Last Rate Last Admin   sodium chloride flush (NS) 0.9 % injection 10 mL  10 mL Intracatheter PRN Samantha Beckwith, MD   10 mL at 10/01/22 1124   Facility-Administered Medications Ordered in Other Visits  Medication Dose Route Frequency Provider Last Rate Last Admin   alum & mag hydroxide-simeth (MAALOX/MYLANTA) 200-200-20 MG/5ML suspension 30 mL  30 mL Oral Once Samantha Beckwith, MD       influenza vac split quadrivalent PF (FLUARIX) injection 0.5 mL  0.5 mL Intramuscular Once Samantha Beckwith, MD       sodium chloride flush (NS) 0.9 % injection 10 mL  10 mL Intracatheter PRN Mosher, Harvin Hazel A, PA-C   10 mL at 09/26/20 4098     I,Oluwatobi Asade,acting as a scribe for Samantha Beckwith, MD.,have documented all relevant documentation on the behalf of Samantha Beckwith, MD,as directed by  Samantha Beckwith, MD while in the presence of Samantha Beckwith, MD.

## 2022-10-01 NOTE — Telephone Encounter (Signed)
Talked to [place] and she will make sure that the nurses know about her drawing her blood.

## 2022-10-01 NOTE — Progress Notes (Signed)
CRITICAL VALUE STICKER  CRITICAL VALUE:  Plt 16  RECEIVER (on-site recipient of call):  Dyane Dustman RN  DATE & TIME NOTIFIED:   10/01/2022 @ 1133  MESSENGER (representative from lab):  [facility]  MD NOTIFIED:   Dr. Gilman Buttner  TIME OF NOTIFICATION:  1142  RESPONSE:   One unit of Platelets on 10/02/2022 @ [address]

## 2022-10-02 ENCOUNTER — Inpatient Hospital Stay: Payer: Medicare Other

## 2022-10-02 VITALS — BP 118/63 | HR 82 | Temp 98.0°F | Resp 18 | Ht 61.5 in | Wt 192.0 lb

## 2022-10-02 DIAGNOSIS — C9110 Chronic lymphocytic leukemia of B-cell type not having achieved remission: Secondary | ICD-10-CM

## 2022-10-02 DIAGNOSIS — D801 Nonfamilial hypogammaglobulinemia: Secondary | ICD-10-CM

## 2022-10-02 DIAGNOSIS — D61818 Other pancytopenia: Secondary | ICD-10-CM

## 2022-10-02 DIAGNOSIS — D819 Combined immunodeficiency, unspecified: Secondary | ICD-10-CM

## 2022-10-02 LAB — BPAM PLATELET PHERESIS
Blood Product Expiration Date: 202407052359
Unit Type and Rh: 7300

## 2022-10-02 LAB — PREPARE PLATELET PHERESIS

## 2022-10-02 MED ORDER — DIPHENHYDRAMINE HCL 50 MG/ML IJ SOLN
25.0000 mg | Freq: Once | INTRAMUSCULAR | Status: AC
  Administered 2022-10-02: 25 mg via INTRAVENOUS
  Filled 2022-10-02: qty 1

## 2022-10-02 MED ORDER — SODIUM CHLORIDE 0.9% IV SOLUTION
250.0000 mL | Freq: Once | INTRAVENOUS | Status: AC
  Administered 2022-10-02: 250 mL via INTRAVENOUS

## 2022-10-02 MED ORDER — DEXTROSE 5 % IV SOLN: Freq: Once | INTRAVENOUS | Status: AC

## 2022-10-02 MED ORDER — ALTEPLASE 2 MG IJ SOLR: 2.0000 mg | Freq: Once | INTRAMUSCULAR | Status: DC | PRN

## 2022-10-02 MED ORDER — DIPHENHYDRAMINE HCL 25 MG PO CAPS: 25.0000 mg | ORAL_CAPSULE | Freq: Once | ORAL | Status: DC

## 2022-10-02 MED ORDER — IMMUNE GLOBULIN (HUMAN) 10 GM/100ML IV SOLN
0.9000 g/kg | Freq: Once | INTRAVENOUS | Status: AC
  Administered 2022-10-02: 45 g via INTRAVENOUS
  Filled 2022-10-02: qty 400

## 2022-10-02 MED ORDER — SODIUM CHLORIDE 0.9% FLUSH
10.0000 mL | Freq: Once | INTRAVENOUS | Status: AC | PRN
  Administered 2022-10-02: 10 mL

## 2022-10-02 MED ORDER — ACETAMINOPHEN 325 MG PO TABS
650.0000 mg | ORAL_TABLET | Freq: Once | ORAL | Status: AC
  Administered 2022-10-02: 650 mg via ORAL

## 2022-10-02 MED ORDER — ACETAMINOPHEN 325 MG PO TABS
650.0000 mg | ORAL_TABLET | Freq: Once | ORAL | Status: DC
  Filled 2022-10-02: qty 2

## 2022-10-02 MED ORDER — HEPARIN SOD (PORK) LOCK FLUSH 100 UNIT/ML IV SOLN
500.0000 [IU] | Freq: Once | INTRAVENOUS | Status: AC | PRN
  Administered 2022-10-02: 500 [IU]

## 2022-10-02 NOTE — Patient Instructions (Signed)

## 2022-10-03 LAB — PREPARE PLATELET PHERESIS

## 2022-10-03 LAB — BPAM PLATELET PHERESIS: ISSUE DATE / TIME: 202407030758

## 2022-10-04 ENCOUNTER — Encounter: Payer: Self-pay | Admitting: Oncology

## 2022-10-07 ENCOUNTER — Other Ambulatory Visit: Payer: Self-pay | Admitting: Oncology

## 2022-10-07 DIAGNOSIS — E876 Hypokalemia: Secondary | ICD-10-CM

## 2022-10-08 ENCOUNTER — Other Ambulatory Visit (HOSPITAL_COMMUNITY): Payer: Self-pay

## 2022-10-08 ENCOUNTER — Inpatient Hospital Stay (INDEPENDENT_AMBULATORY_CARE_PROVIDER_SITE_OTHER): Payer: Medicare Other | Admitting: Hematology and Oncology

## 2022-10-08 ENCOUNTER — Inpatient Hospital Stay: Payer: Medicare Other

## 2022-10-08 ENCOUNTER — Encounter: Payer: Self-pay | Admitting: Hematology and Oncology

## 2022-10-08 ENCOUNTER — Telehealth: Payer: Self-pay

## 2022-10-08 ENCOUNTER — Other Ambulatory Visit: Payer: Self-pay

## 2022-10-08 VITALS — BP 129/64 | HR 104 | Temp 98.3°F | Resp 18 | Ht 61.5 in | Wt 193.6 lb

## 2022-10-08 DIAGNOSIS — D801 Nonfamilial hypogammaglobulinemia: Secondary | ICD-10-CM

## 2022-10-08 DIAGNOSIS — C9110 Chronic lymphocytic leukemia of B-cell type not having achieved remission: Secondary | ICD-10-CM

## 2022-10-08 DIAGNOSIS — D649 Anemia, unspecified: Secondary | ICD-10-CM

## 2022-10-08 DIAGNOSIS — D630 Anemia in neoplastic disease: Secondary | ICD-10-CM

## 2022-10-08 DIAGNOSIS — I639 Cerebral infarction, unspecified: Secondary | ICD-10-CM

## 2022-10-08 DIAGNOSIS — D819 Combined immunodeficiency, unspecified: Secondary | ICD-10-CM

## 2022-10-08 DIAGNOSIS — D696 Thrombocytopenia, unspecified: Secondary | ICD-10-CM

## 2022-10-08 DIAGNOSIS — D61818 Other pancytopenia: Secondary | ICD-10-CM

## 2022-10-08 HISTORY — DX: Cerebral infarction, unspecified: I63.9

## 2022-10-08 HISTORY — DX: Anemia in neoplastic disease: D63.0

## 2022-10-08 LAB — SAMPLE TO BLOOD BANK

## 2022-10-08 LAB — TYPE AND SCREEN

## 2022-10-08 LAB — CBC AND DIFFERENTIAL
Bands: 2
EOS%: 1 %
HCT: 23 — AB (ref 36–46)
Hemoglobin: 7.8 — AB (ref 12.0–16.0)
LYMPH%: 75 %
MCV: 91 (ref 81–99)
NEUT%: 22 %
Neutrophils Absolute: 1.66
Platelets: 19 10*3/uL — AB (ref 150–400)
WBC: 6.9

## 2022-10-08 LAB — BASIC METABOLIC PANEL
BUN: 23 — AB (ref 4–21)
CO2: 23 — AB (ref 13–22)
Chloride: 106 (ref 99–108)
Creatinine: 0.8 (ref 0.5–1.1)
EGFR: 60
Glucose: 104
Potassium: 4 mEq/L (ref 3.5–5.1)
Sodium: 138 (ref 137–147)

## 2022-10-08 LAB — CBC: RBC: 2.57 — AB (ref 3.87–5.11)

## 2022-10-08 LAB — COMPREHENSIVE METABOLIC PANEL
Albumin: 3.7 (ref 3.5–5.0)
Calcium: 8.8 (ref 8.7–10.7)

## 2022-10-08 LAB — HEPATIC FUNCTION PANEL
ALT: 17 U/L (ref 7–35)
AST: 25 (ref 13–35)
Alkaline Phosphatase: 88 (ref 25–125)
Bilirubin, Total: 1.4

## 2022-10-08 LAB — PREPARE RBC (CROSSMATCH)

## 2022-10-08 MED ORDER — SODIUM CHLORIDE 0.9% FLUSH
10.0000 mL | INTRAVENOUS | Status: DC | PRN
  Administered 2022-10-08: 10 mL

## 2022-10-08 MED ORDER — PIRTOBRUTINIB 100 MG PO TABS
200.0000 mg | ORAL_TABLET | Freq: Every day | ORAL | 5 refills | Status: DC
  Filled 2022-10-08 – 2022-10-10 (×2): qty 60, 30d supply, fill #0
  Filled 2022-11-05: qty 60, 30d supply, fill #1

## 2022-10-08 MED ORDER — HEPARIN SOD (PORK) LOCK FLUSH 100 UNIT/ML IV SOLN
500.0000 [IU] | Freq: Once | INTRAVENOUS | Status: AC | PRN
  Administered 2022-10-08: 500 [IU]

## 2022-10-08 NOTE — Telephone Encounter (Signed)
Belva Crome PA aware.  Patient aware of transfusion appointment.

## 2022-10-08 NOTE — Assessment & Plan Note (Signed)
Unfortunately, her hemoglobin has decreased from 8.8 to 7.8 in 1 week.  As she is symptomatic and on oral chemotherapy, I will arrange for her to receive 1 unit of packed red blood cells as an outpatient tomorrow.

## 2022-10-08 NOTE — Assessment & Plan Note (Signed)
Her platelets are 19,000, but due to being on oral chemotherapy and at risk for falls, I will arrange for her to have platelet pheresis as an outpatient tomorrow.

## 2022-10-08 NOTE — Assessment & Plan Note (Addendum)
She presented with numbness of her left jaw in June.  MRI revealed a small acute infarct in the right caudate head.  She has persistent numbness of her chin neurologic symptoms or signs.

## 2022-10-08 NOTE — Assessment & Plan Note (Addendum)
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  She did not require treatment until 2008 and has since been through multiple therapies.  She has been taking venetoclax 400 mg daily with good control of her disease for over 5 years.  Unfortunately, her disease is progressed with worsening thrombocytopenia and developing lymphadenopathy and splenomegaly, as well as worsening anemia.  Bone marrow still showed CLL with a normocellular bone marrow of 30%, but 20% of the cellularity was her CLL.  Flow cytometry confirms at 78% of the lymphocytes are abnormal CD5/CD19 positive B-cell population.  We did not see evidence of transformation.  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  She was hospitalized in June due to severe weakness and was found to have a CVA.  She had severe thrombocytopenia and anemia requiring daily transfusions. She has an elevated bilirubin.  She had a decrease transfusion requirement after starting pirtobrutinib 50 mg daily.  We have titrated her dose of pirtobrutinb up to 100 mg daily.  She has continued response with no palpable adenopathy or splenomegaly today.  Her bilirubin continues to improve.  As she is doing well, we will increase the pirtobrutinib to 200 mg daily.  We will also decrease her prednisone by 5 mg weekly, so now 10 mg daily.

## 2022-10-08 NOTE — Assessment & Plan Note (Signed)
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  She continues IVIG monthly.  She received IVIG on July 3.

## 2022-10-08 NOTE — Progress Notes (Signed)
CRITICAL VALUE STICKER  CRITICAL VALUE:  Plt 19; Hgb 7.8  RECEIVER (on-site recipient of call):  Dyane Dustman RN @ [phone]  DATE & TIME NOTIFIED:   10/08/2022 @ 1148  MESSENGER (representative from lab):  [facility] MD NOTIFIED:  Belva Crome PA   TIME OF NOTIFICATION:  1150  RESPONSE:   Patient to receive 1 unit platelets and 1 unit PRBC at [facility] location in [place] on 10/09/2022 @ 0845.

## 2022-10-08 NOTE — Progress Notes (Signed)
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  She did not require treatment until 2008 and has since been through multiple therapies.  She has been taking venetoclax 400 mg daily with good control of her disease for over 5 years.  Unfortunately, her disease is progressed with worsening thrombocytopenia and developing lymphadenopathy and splenomegaly, as well as worsening anemia.  Bone marrow still showed CLL with a normocellular bone marrow of 30%, but 20% of the cellularity was her CLL.  Flow cytometry confirms at 78% of the lymphocytes are abnormal CD5/CD19 positive B-cell population.  We did not see evidence of transformation.  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  She was hospitalized in June due to severe weakness and was found to have a CVA.  She had severe thrombocytopenia and anemia requiring daily transfusions. She has an elevated bilirubin.  She had a decrease transfusion requirement after starting pirtobrutinib 50 mg daily.  We have titrated her dose of pirtobrutinb up to 100 mg daily.  She has continued response with no palpable adenopathy or splenomegaly today.  Her bilirubin continues to improve.  As she is doing well, we will increase the pirtobrutinib to 200 mg daily.  We will also decrease her prednisone by 5 mg weekly, so now 10 mg daily.  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  She continues IVIG monthly.  She received IVIG on July 3.  Thrombocytopenia (HCC) Her platelets are 19,000, but due to being on oral chemotherapy and at risk for falls, I will arrange for her to have platelet  pheresis as an outpatient tomorrow.  Anemia in neoplastic disease Unfortunately, her hemoglobin has decreased from 8.8 to 7.8 in 1 week.  As she is symptomatic and on oral chemotherapy, I will arrange for her to receive 1 unit of packed red blood cells as an outpatient tomorrow.  CVA (cerebral vascular accident) [facility]) She presented with numbness of her left jaw in June.  MRI revealed a small acute infarct in the right caudate head.  She has persistent numbness, mainly of her chin.    The patient understands the plans discussed today and is in agreement with them.  She knows to contact our office if she develops concerns prior to her next appointment.   I provided 20 minutes of face-to-face time during this encounter and > 50% was spent counseling as documented under my assessment and plan.    [name], PA-C  [facility] [address] [place] [address] Dept: [phone] Dept Fax: [phone]   Orders Placed This Encounter  Procedures   Basic metabolic panel    This external order was created through the Results Console.   Comprehensive metabolic panel    This external order was created through the Results Console.   Hepatic function panel    This external order was created through the Results Console.   CBC and differential    This external order was created through the Results Console.   CBC    This external order was created through the Results Console.      CHIEF COMPLAINT:  CC:  CLL  Current Treatment: Pirtobrutinib 100 mg daily.  HISTORY OF PRESENT ILLNESS:  Samantha Warren is a 77 y.o. 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  She was on observation until November 2008. She was initially treated with chlorambucil and prednisone, as she refused intravenous chemotherapy.  She had a partial response to this regimen.  By January 2010, she had progressive  disease with a white count over 200,000, with associated anemia, splenomegaly, thrombocytopenia, and adenopathy.  She then received 5 cycles of fludarabine with good response.  She did well until January 2012, when she had progression once again.  She had a single dose of bendamustine and rituximab, which kept her disease under control for over a year.  However, she had a severe allergic reaction to the rituximab, so has not received further rituximab.  She was hospitalized after the bendamustine because of severe pancytopenia and required multiple transfusions, so was placed on observation.  In March 2013, she had progression of disease again, so was treated with bendamustine for 6 cycles, at a 50% dose reduction, and once again had a excellent response.  She was on observation until August 2014, then had progression of disease, so was placed on ibrutinib 420 mg daily.  The ibrutinib had kept her disease under fairly good control, but then she develops severe bilateral lower extremity cellulitis in April 2016, so ibrutinib was placed on hold.  She had persistent cellulitis, as well as  Clostridium difficile colitis in May 2016 requiring hospitalization.  She was readmitted soon after discharge with worsening cellulitis, requiring a prolonged hospitalization.  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 She eventually had resolution of the cellulitis   As she was largely asymptomatic, we continued observation until February 2017, at which time she had worsening splenomegaly.  Repeat CT imaging in February showed progressive disease, with marked splenomegaly and bulky lymphadenopathy.  She was then placed back on ibrutinib 420mg  daily.  The lymphocytosis, anemia and thrombocytopenia slowly improved.  Due to neutropenia, ibrutinib was placed on hold in December 2017.  The ibrutinib was resumed in January 2018, but discontinued in  August 2018 due to toxicities, mainly severe abdominal muscle cramping.  She was hospitalized in early September 2018 with urinary tract infection and pancytopenia.  She received multiple red blood cell and platelet transfusions during her stay.  She was admitted again in late September 2018 with perineal cellulitis extending to the lower abdomen and upper thigh, as well as persistent pancytopenia.  CT abdomen and pelvis revealed interval decrease in the abdominal lymphadenopathy, chronic massive splenomegaly with scattered small splenic infarcts, and small left pleural effusion with bibasilar atelectasis.  She required packed red blood cells while hospitalized.  She was discharged home with home health and [facility]  We continued to follow her closely, but did not place her on a treatment due to the persistent cellulitis.  She then was admitted in October 2018 with severe hypercalcemia, with a calcium of 15.  She was treated with IV fluids, zoledronic acid and Calcitonin injections.  We continued to monitor her closely.  She had recurrent hypercalcemia, for which she received IV fluids and zolendronic acid as an outpatient.     Due to worsening pancytopenia, she underwent bone marrow biopsy in January 2019.   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  The cellulitis had improved to a point we felt she could be treated again, so she was placed on venetoclax CLL in January 2019.  She has continued on venetoclax 400 mg daily after ramp up dosing and has tolerated that fairly well, except for mild diarrhea.  Since starting venetoclax, she has had decreasing splenomegaly and improvement in her pancytopenia.  She initially continued to require IV fluids and zoledronic acid for the hypercalcemia.  Her last dose of zoledronic acid was given in March 2019.  In March, she had an irregular heart rhythm and EKG revealed  occasional premature atrial complexes, as well as an incomplete right bundle branch block.  This was stable compared to EKG done in October 2018.  Quantitative immunoglobulins done in August 2019 revealed a normal IgG, with low IgA and IgM.  She has continued IVIG monthly.  She had an episode of Campylobacter diarrhea in November 2019, which was treated with azithromycin for 7 days with resolution of her diarrhea. Venetoclax was held temporarily until her diarrhea resolved.  She has since tolerated venetoclax without significant difficulty.     In February 2022, she had a right diagnostic mammogram due to right breast subareolar tenderness, which did not reveal any evidence of malignancy.  CT abdomen in April did not reveal any acute abnormality or other findings to explain the left-sided abdominal pain.  There was resolution of previously seen gross splenomegaly.  There were prominent retroperitoneal lymph nodes, which had significantly diminished in size compared to prior exam.  A hiatal hernia was seen. There was descending colonic diverticulosis without evidence of acute diverticulitis.  Bilateral screening mammogram in May 2022 revealed possible asymmetry in the left breast.  Left diagnostic mammogram and ultrasound in June revealed a cluster of cysts/apocrine cyst in the left breast at 2 o'clock, 3 cm the nipple, measuring 6 x 3 x 6 mm, unchanged from the exam dated May 2021. There were no solid masses or suspicious lesions. She is up to date on colonoscopy with her last colonoscopy being in June 2022 with Dr. Jennye Boroughs.  This was negative, and repeat in 10 years was advised.    She had worsening anemia and thrombocytopenia, felt to be most likely due to progressive chronic lymphocytic leukemia in April.  Venetoclax was discontinued.  Bone marrow biopsy was performed in interventional radiology in April.  Pathology was consistent with CLL with a normocellular bone marrow of 30%, but 20% of the  cellularity was her CLL.  Flow cytometry confirms at 78% of the lymphocytes are abnormal CD5/CD19 positive B-cell population.  There was not evidence of transformation.  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  She was hospitalized in May with multifocal anemia.  Also had recurrent C. difficile.  She was transfused packed red blood cells and platelets during that admission.  She was hospitalized again in June due to severe weakness.  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  She had severe thrombocytopenia and anemia requiring daily transfusions.  She had severe hematuria which required continuous bladder irrigation, but finally resolved.  She was placed on high-dose prednisone and we have been tapering that.  She was started on pirtobrutinib 50 mg daily, then this was increased to 100 mg daily.  She eventually had decreased transfusion requirements and was able to be discharged to rehab.  We have been seeing her weekly and she has required weekly platelet transfusions, but has not required packed red  blood cells this discharge.  INTERVAL HISTORY:  Samantha Warren is here today for repeat clinical assessment and she continues to improve with physical therapy.  She is able to walk short distances without assistance.  She states she continues to tolerate pirtobrutinib without difficulty.  She has noted increased fatigue and mild shortness of breath with exertion in the past few days.  She denies any overt bleeding.  She reports persistent numbness of her chin, which seems worse.  She denies fevers or chills. She denies pain. Her appetite is good. Her weight has increased 1 pounds over last 1 week .  She is currently on prednisone 15 mg daily.  REVIEW OF SYSTEMS:  Review of Systems  Constitutional:  Positive for fatigue. Negative for appetite change, chills, fever and unexpected weight change.  HENT:   Negative for lump/mass, mouth sores, nosebleeds and sore throat.    Respiratory:  Positive for shortness of breath (Mild, with exertion). Negative for cough.   Cardiovascular:  Negative for chest pain and leg swelling.  Gastrointestinal:  Negative for abdominal pain, blood in stool, constipation, diarrhea, nausea and vomiting.  Genitourinary:  Negative for difficulty urinating, dysuria, frequency, hematuria and vaginal bleeding.   Musculoskeletal:  Negative for arthralgias, back pain and myalgias.  Skin:  Negative for rash.  Neurological:  Positive for numbness (persistent numbness of her chin). Negative for dizziness and headaches.  Hematological:  Negative for adenopathy. Does not bruise/bleed easily.  Psychiatric/Behavioral:  Negative for depression and sleep disturbance. The patient is not nervous/anxious.      VITALS:  Blood pressure 129/64, pulse (!) 104, temperature 98.3 F (36.8 C), temperature source Oral, resp. rate 18, height 5' 1.5" (1.562 m), weight 193 lb 9.6 oz (87.8 kg), SpO2 99 %.  Wt Readings from Last 3 Encounters:  10/08/22 193 lb 9.6 oz (87.8 kg)  10/02/22 192 lb (87.1 kg)  10/01/22 189 lb 4.8 oz (85.9 kg)    Body mass index is 35.99 kg/m.  Performance status (ECOG): 2 - Symptomatic, <50% confined to bed  PHYSICAL EXAM:  Physical Exam Vitals and nursing note reviewed.  Constitutional:      General: She is not in acute distress.    Appearance: Normal appearance.  HENT:     Head: Normocephalic and atraumatic.     Mouth/Throat:     Mouth: Mucous membranes are moist.     Pharynx: Oropharynx is clear. No oropharyngeal exudate or posterior oropharyngeal erythema.  Eyes:     General: No scleral icterus.    Extraocular Movements: Extraocular movements intact.     Conjunctiva/sclera: Conjunctivae normal.     Pupils: Pupils are equal, round, and reactive to light.  Cardiovascular:     Rate and Rhythm: Normal rate and regular rhythm.     Heart sounds: Normal heart sounds. No murmur heard.    No friction rub. No gallop.   Pulmonary:     Effort: Pulmonary effort is normal.     Breath sounds: Normal breath sounds. No wheezing, rhonchi or rales.  Abdominal:     General: There is no distension.     Palpations: Abdomen is soft. There is no hepatomegaly, splenomegaly or mass.     Tenderness: There is no abdominal tenderness.  Musculoskeletal:        General: Normal range of motion.     Cervical back: Normal range of motion and neck supple. No tenderness.     Right lower leg: No edema.     Left lower leg: No edema.  Lymphadenopathy:     Cervical: No cervical adenopathy.     Upper Body:     Right upper body: No supraclavicular or axillary adenopathy.     Left upper body: No supraclavicular or axillary adenopathy.     Lower Body: No right inguinal adenopathy. No left inguinal adenopathy.  Skin:    General: Skin is warm and dry.     Coloration: Skin is not jaundiced.     Findings: No rash.  Neurological:     Mental Status: She is alert and oriented to person, place, and time.     Cranial Nerves: Cranial nerve deficit (Numbness of chin) present. No dysarthria or facial asymmetry.     Motor: Motor function is intact.  Psychiatric:        Mood and Affect: Mood normal.        Behavior: Behavior normal.        Thought Content: Thought content normal.     LABS:      Latest Ref Rng & Units 10/08/2022   12:00 AM 10/01/2022   12:00 AM 09/26/2022   12:00 AM  CBC  WBC  6.9     7.1     7.9      Hemoglobin 12.0 - 16.0 7.8     8.8     9.2      Hematocrit 36 - 46 23     26     27       Platelets 150 - 400 K/uL 19     16     12          This result is from an external source.      Latest Ref Rng & Units 10/08/2022   12:00 AM 10/01/2022   12:00 AM 09/26/2022   12:00 AM  CMP  BUN 4 - 21 23     17     10       Creatinine 0.5 - 1.1 0.8     0.7     0.6      Sodium 137 - 147 138     139     139      Potassium 3.5 - 5.1 mEq/L 4.0     4.5     3.9      Chloride 99 - 108 106     106     107      CO2 13 - 22 23     23      25       Calcium 8.7 - 10.7 8.8     9.2     9.0      Alkaline Phos 25 - 125 88     116     117      AST 13 - 35 25     21     22       ALT 7 - 35 U/L 17     17     17          This result is from an external source.     No results found for: "CEA1", "CEA" / No results found for: "CEA1", "CEA" No results found for: "PSA1" No results found for: "ZOX096" No results found for: "CAN125"  No results found for: "TOTALPROTELP", "ALBUMINELP", "A1GS", "A2GS", "BETS", "BETA2SER", "GAMS", "MSPIKE", "SPEI" Lab Results  Component Value Date   TIBC 321 07/09/2022   FERRITIN 790 (H) 07/09/2022   IRONPCTSAT 27 07/09/2022   Lab Results  Component Value  Date   LDH 378 (H) 07/09/2022    STUDIES:  No results found.    HISTORY:   Past Medical History:  Diagnosis Date   Anemia in neoplastic disease 10/08/2022   Cancer [facility])    CVA (cerebral vascular accident) (HCC) 10/08/2022    Past Surgical History:  Procedure Laterality Date   arm surgery     GALLBLADDER SURGERY     TOTAL KNEE ARTHROPLASTY      Family History  Problem Relation Age of Onset   Cancer Mother    Hypertension Father    Hypertension Sister    Stroke Sister    Hypertension Brother    Stroke Brother     Social History:  reports that she has been smoking cigarettes. She has a 15.00 pack-year smoking history. She has never used smokeless tobacco. She reports current drug use. No history on file for alcohol use.The patient is accompanied by, her son Thayer Ohm, today.  Allergies:  Allergies  Allergen Reactions   Metronidazole     Other reaction(s): Other (See Comments)   Rituximab     Other reaction(s): Other (See Comments)   Sulfa Antibiotics Rash    Current Medications: Current Outpatient Medications  Medication Sig Dispense Refill   pirtobrutinib (JAYPIRCA) 100 MG tablet Take 2 tablets (200 mg total) by mouth daily. 60 tablet 5   alendronate (FOSAMAX) 70 MG tablet      allopurinol (ZYLOPRIM) 300 MG tablet Take 1  tablet (300 mg total) by mouth daily. 30 tablet 1   amLODipine (NORVASC) 2.5 MG tablet Take 2.5 mg by mouth daily.     Ascorbic Acid (VITAMIN C WITH ROSE HIPS) 500 MG tablet Take 500 mg by mouth daily.     benzonatate (TESSALON) 100 MG capsule TAKE ONE CAPSULE BY MOUTH 3 TIMES A DAY AS NEEDED 60 capsule 5   calcium carbonate (OSCAL) 1500 (600 Ca) MG TABS tablet Take by mouth.     Cholecalciferol (VITAMIN D3) 125 MCG (5000 UT) TABS Take 1 tablet by mouth daily.     diclofenac Sodium (VOLTAREN) 1 % GEL Apply topically.     diphenoxylate-atropine (LOMOTIL) 2.5-0.025 MG tablet Take 1-2 tablets by mouth every 6 (six) hours.     KLOR-CON M20 20 MEQ tablet Take 1 tablet (20 mEq total) by mouth daily. Patient voiced currently taking once daily provider aware 90 tablet 5   lidocaine (LIDODERM) 5 % 2 patches daily.     lidocaine-prilocaine (EMLA) cream Apply 1 Application topically as needed. 30 g 5   Multiple Vitamin (MULTIVITAMIN WITH MINERALS) TABS tablet Take 1 tablet by mouth daily.     Omega-3 Fatty Acids (FISH OIL) 1000 MG CAPS Take 1 capsule by mouth daily.     omeprazole (PRILOSEC) 20 MG capsule TAKE 1 CAPSULE BY MOUTH EVERY DAY 90 capsule 3   oxyCODONE (ROXICODONE) 15 MG immediate release tablet Take 1 tablet (15 mg total) by mouth every 6 (six) hours as needed. 100 tablet 0   predniSONE (DELTASONE) 10 MG tablet Take 2 tablets (20 mg total) by mouth 2 (two) times daily with a meal. (Patient taking differently: Take 15 mg by mouth daily with breakfast.) 60 tablet 1   prochlorperazine (COMPAZINE) 10 MG tablet TAKE 1 TABLET BY MOUTH EVERY 6 HOURS AS NEEDED 90 tablet 1   Current Facility-Administered Medications  Medication Dose Route Frequency Provider Last Rate Last Admin   sodium chloride flush (NS) 0.9 % injection 10 mL  10 mL Intracatheter PRN Gery Pray  H, MD   10 mL at 10/08/22 1129   Facility-Administered Medications Ordered in Other Visits  Medication Dose Route Frequency Provider  Last Rate Last Admin   alum & mag hydroxide-simeth (MAALOX/MYLANTA) 200-200-20 MG/5ML suspension 30 mL  30 mL Oral Once [name], MD       influenza vac split quadrivalent PF (FLUARIX) injection 0.5 mL  0.5 mL Intramuscular Once [name], MD       sodium chloride flush (NS) 0.9 % injection 10 mL  10 mL Intracatheter PRN Danyal Adorno A, PA-C   10 mL at 09/26/20 0839   sodium chloride flush (NS) 0.9 % injection 10 mL  10 mL Intracatheter PRN [name], MD   10 mL at 10/01/22 1124

## 2022-10-09 ENCOUNTER — Other Ambulatory Visit: Payer: Self-pay

## 2022-10-09 ENCOUNTER — Other Ambulatory Visit (HOSPITAL_COMMUNITY): Payer: Self-pay

## 2022-10-09 ENCOUNTER — Inpatient Hospital Stay: Payer: Medicare Other

## 2022-10-09 DIAGNOSIS — D630 Anemia in neoplastic disease: Secondary | ICD-10-CM

## 2022-10-09 DIAGNOSIS — D696 Thrombocytopenia, unspecified: Secondary | ICD-10-CM

## 2022-10-09 DIAGNOSIS — C9110 Chronic lymphocytic leukemia of B-cell type not having achieved remission: Secondary | ICD-10-CM

## 2022-10-09 LAB — BPAM RBC
Blood Product Expiration Date: 202407272359
ISSUE DATE / TIME: 202407100840

## 2022-10-09 LAB — TYPE AND SCREEN
ABO/RH(D): B NEG
Unit division: 0

## 2022-10-09 LAB — PREPARE PLATELET PHERESIS

## 2022-10-09 LAB — BPAM PLATELET PHERESIS
Blood Product Expiration Date: 202407122359
Unit Type and Rh: 9500

## 2022-10-09 MED ORDER — HEPARIN SOD (PORK) LOCK FLUSH 100 UNIT/ML IV SOLN
500.0000 [IU] | Freq: Every day | INTRAVENOUS | Status: AC | PRN
  Administered 2022-10-09: 500 [IU]

## 2022-10-09 MED ORDER — ACETAMINOPHEN 325 MG PO TABS
650.0000 mg | ORAL_TABLET | Freq: Once | ORAL | Status: AC
  Administered 2022-10-09: 650 mg via ORAL
  Filled 2022-10-09: qty 2

## 2022-10-09 MED ORDER — SODIUM CHLORIDE 0.9% IV SOLUTION: 250.0000 mL | Freq: Once | INTRAVENOUS | Status: DC

## 2022-10-09 MED ORDER — SODIUM CHLORIDE 0.9% FLUSH
10.0000 mL | INTRAVENOUS | Status: AC | PRN
  Administered 2022-10-09: 10 mL

## 2022-10-09 MED ORDER — DIPHENHYDRAMINE HCL 25 MG PO CAPS
25.0000 mg | ORAL_CAPSULE | Freq: Once | ORAL | Status: AC
  Administered 2022-10-09: 25 mg via ORAL
  Filled 2022-10-09: qty 1

## 2022-10-09 NOTE — Patient Instructions (Signed)
Blood Transfusion, Adult, Care After The following information offers guidance on how to care for yourself after your procedure. Your health care provider may also give you more specific instructions. If you have problems or questions, contact your health care provider. What can I expect after the procedure? After the procedure, it is common to have: Bruising and soreness where the IV was inserted. A headache. Follow these instructions at home: IV insertion site care     Follow instructions from your health care provider about how to take care of your IV insertion site. Make sure you: Wash your hands with soap and water for at least 20 seconds before and after you change your bandage (dressing). If soap and water are not available, use hand sanitizer. Change your dressing as told by your health care provider. Check your IV insertion site every day for signs of infection. Check for: Redness, swelling, or pain. Bleeding from the site. Warmth. Pus or a bad smell. General instructions Take over-the-counter and prescription medicines only as told by your health care provider. Rest as told by your health care provider. Return to your normal activities as told by your health care provider. Keep all follow-up visits. Lab tests may need to be done at certain periods to recheck your blood counts. Contact a health care provider if: You have itching or red, swollen areas of skin (hives). You have a fever or chills. You have pain in the head, back, or chest. You feel anxious or you feel weak after doing your normal activities. You have redness, swelling, warmth, or pain around the IV insertion site. You have blood coming from the IV insertion site that does not stop with pressure. You have pus or a bad smell coming from your IV insertion site. If you received your blood transfusion in an outpatient setting, you will be told whom to contact to report any reactions. Get help right away if: You  have symptoms of a serious allergic or immune system reaction, including: Trouble breathing or shortness of breath. Swelling of the face, feeling flushed, or widespread rash. Dark urine or blood in the urine. Fast heartbeat. These symptoms may be an emergency. Get help right away. Call 911. Do not wait to see if the symptoms will go away. Do not drive yourself to the hospital. Summary Bruising and soreness around the IV insertion site are common. Check your IV insertion site every day for signs of infection. Rest as told by your health care provider. Return to your normal activities as told by your health care provider. Get help right away for symptoms of a serious allergic or immune system reaction to the blood transfusion. This information is not intended to replace advice given to you by your health care provider. Make sure you discuss any questions you have with your health care provider. Document Revised: 06/15/2021 Document Reviewed: 06/15/2021 Elsevier Patient Education  2024 [employer]  

## 2022-10-10 ENCOUNTER — Other Ambulatory Visit: Payer: Self-pay

## 2022-10-10 ENCOUNTER — Other Ambulatory Visit (HOSPITAL_COMMUNITY): Payer: Self-pay

## 2022-10-10 LAB — TYPE AND SCREEN: Antibody Screen: NEGATIVE

## 2022-10-10 LAB — PREPARE PLATELET PHERESIS: Unit division: 0

## 2022-10-10 LAB — BPAM RBC: Unit Type and Rh: 1700

## 2022-10-10 LAB — BPAM PLATELET PHERESIS: ISSUE DATE / TIME: 202407100708

## 2022-10-11 ENCOUNTER — Telehealth: Payer: Self-pay

## 2022-10-11 ENCOUNTER — Other Ambulatory Visit: Payer: Self-pay

## 2022-10-11 NOTE — Telephone Encounter (Signed)
-----   Message from [name] sent at 10/11/2022  9:05 AM EDT ----- Please contact her rehab and be sure I wrote the order for pirtobrutinib 100 mg tabs, 2 tabs daily. Thanks

## 2022-10-11 NOTE — Telephone Encounter (Signed)
Spoke with Jama Flavors med nurse at [facility] [phone] and she confirmed med order as stated in your message .

## 2022-10-14 ENCOUNTER — Encounter: Payer: Self-pay | Admitting: Oncology

## 2022-10-15 ENCOUNTER — Inpatient Hospital Stay (INDEPENDENT_AMBULATORY_CARE_PROVIDER_SITE_OTHER): Payer: Medicare Other | Admitting: Oncology

## 2022-10-15 ENCOUNTER — Telehealth: Payer: Self-pay

## 2022-10-15 ENCOUNTER — Inpatient Hospital Stay: Payer: Medicare Other

## 2022-10-15 ENCOUNTER — Encounter: Payer: Self-pay | Admitting: Oncology

## 2022-10-15 VITALS — BP 106/68 | HR 104 | Temp 99.1°F | Resp 18 | Ht 61.5 in | Wt 193.3 lb

## 2022-10-15 DIAGNOSIS — D61818 Other pancytopenia: Secondary | ICD-10-CM | POA: Diagnosis not present

## 2022-10-15 DIAGNOSIS — D801 Nonfamilial hypogammaglobulinemia: Secondary | ICD-10-CM | POA: Diagnosis not present

## 2022-10-15 DIAGNOSIS — C9110 Chronic lymphocytic leukemia of B-cell type not having achieved remission: Secondary | ICD-10-CM

## 2022-10-15 MED ORDER — HEPARIN SOD (PORK) LOCK FLUSH 100 UNIT/ML IV SOLN
500.0000 [IU] | Freq: Once | INTRAVENOUS | Status: AC | PRN
  Administered 2022-10-15: 500 [IU]

## 2022-10-15 MED ORDER — SODIUM CHLORIDE 0.9% FLUSH
10.0000 mL | INTRAVENOUS | Status: DC | PRN
  Administered 2022-10-15: 10 mL

## 2022-10-15 NOTE — Progress Notes (Signed)
CRITICAL VALUE STICKER  CRITICAL VALUE:  Plt 27; Hgb 9.4  RECEIVER (on-site recipient of call):  Dyane Dustman RN      DATE & TIME NOTIFIED:   10/15/2022 @ 1458  MESSENGER (representative from lab):  Durward Mallard [facility] lab  MD NOTIFIED:  Dr. Gilman Buttner   TIME OF NOTIFICATION:  1500  RESPONSE: Dr. Gilman Buttner scheduled to see patient.

## 2022-10-15 NOTE — Telephone Encounter (Signed)
Pt looks good today. She is ambulating alone. She gets SOB on exertion, but is excited that she is walking instead of w/c. She hasn't smoked since she was in the hospital. Marylene Land and I praised her for that accomplishment. She is staying @ [facility] ([facility] @ present. The nurses administer her medication daily. No missed doses. She denies N/V, constipation, diarrhea, cough, chest pain, skin rash, and fever.

## 2022-10-15 NOTE — Progress Notes (Signed)
[facility]  [address] 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  She did not require treatment until 2008 and has since been through multiple therapies.  She has been taking venetoclax 400 mg daily with good control of her disease for over 5 years.  Unfortunately her disease is now progressed with worsening thrombocytopenia and developing lymphadenopathy as well as worsening anemia.  She has now had a bone marrow which still shows CLL with a normal cellular bone marrow of 30% but 20% of the cellularity is her CLL.  Flow cytometry confirms at 78% of the lymphocytes are abnormal CD5/CD19 positive B-cell population.  We do not see evidence of transformation.  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 Venetoclax was stopped and she is now on pirtobrutinib 200 mg daily.   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  She will proceed with her monthly IVIG tomorrow.   Thrombocytopenia Her platelet count has slowly declined over the last few months and was 38,000, then down to 26,000, and now down to 8,000. She ended up hospitalized with a stroke and was transfusion dependent almost daily. I did put her on a trial of steroids and she had no response of the platelets or red cells. We are now tapering that dose and she is down to 20mg  daily. She was placed on Pirtobrutinib initially at 50 mg daily and then increased to 100mg  daily, and now 200 mg daily. She is tolerating that well. She had severe hematuria which finally resolved but required continuous bladder irrigation. She was finally discharged to a rehab facility  and is making progress with PT. She does have improvement in her adenopathy of the supraclavicular and cervical areas, and splenomegaly. We will plan weekly monitoring of her blood.   Worsening anemia. Her hemoglobin is up to 8.8 now so she will not receive RBC's. She did receive transfusion while in the hospital of PRBC's and platelets. Her hemoglobin was 7.8 last week and she received 1 unit of packed RBCs and 1 plateletpheresis. Today her hemoglobin is up to 9.4 and platelets 27,000.  CVA She had a stroke in June with numbness of her left chin and left hemiparesis. Her neurologic deficit is steadily improving and she is now in rehab.  Plan: Her hemoglobin is 28.1, WBC is 7.8, and platelet count is 27,000 today, so she won't need a transfusion. Her CMP is normal, but she has mild signs of dehydration with a BUN of 21 and creatinine of 0.7. Her blood glucose is 218 and this might be due to her prednisone. I will reduce the dose of her prednisone to 5 mg daily and taper it down gently. We will stop with the weekly platelet transfusions but she might need the transfusions occasionally. She is on Pirtobrutinib 200 mg daily and she has been tolerating it well. I will see her  back in 1 week with CBC and CMP. We will continue weekly follow up for the rest of this month and may be able to go to every 2 weeks. We also hope to have her off the prednisone by the end of July. The patient understand the plans discussed today and  are in agreement with them.  I have answered their questions.  She knows to contact our office if she develops concerns prior to her next appointment.  I provided 30 minutes of face-to-face time during this encounter and > 50% was spent counseling as documented under my assessment and plan.    [name], MD  [facility] AT [facility] [address] [place] [address] Dept: [phone] Dept Fax: [phone]   No  orders of the defined types were placed in this encounter.    CHIEF COMPLAINT:  CC: CLL with combined immunodeficiency  Current Treatment: IVIG  HISTORY OF PRESENT ILLNESS:  Samantha Warren is a 77 y.o. 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  She was on observation until November 2008. She was initially treated with chlorambucil and prednisone, as she refused intravenous chemotherapy.  She had a partial response to this regimen.  By January 2010, she had progressive disease with a white count over 200,000, with associated anemia, splenomegaly, thrombocytopenia, and adenopathy.  She then received 5 cycles of fludarabine with good response.  She did well until January 2012, when she had progression once again.  She had a single dose of bendamustine and rituximab, which kept her disease under control for over a year.  However, she had a severe allergic reaction to the rituximab, so has not received further rituximab.  She was hospitalized after the bendamustine because of severe pancytopenia and required multiple transfusions, so was placed on observation.  In March 2013, she had progression of disease again, so was treated with bendamustine for 6 cycles, at a 50% dose reduction, and once again had a excellent response.  She was on observation until August 2014, then had progression of disease, so was placed on ibrutinib 420 mg daily.  The ibrutinib had kept her disease under fairly good control, but then she develops severe bilateral lower extremity cellulitis in April 2016, so ibrutinib was placed on hold.  She had persistent cellulitis, as well as  Clostridium difficile colitis in May 2016 requiring hospitalization.  She was readmitted soon after discharge with worsening cellulitis, requiring a prolonged hospitalization.  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 She eventually  had resolution of the cellulitis   As she was largely asymptomatic, we continued observation until February 2017, at which time she had worsening splenomegaly.  Repeat CT imaging in February showed progressive disease, with marked splenomegaly and bulky lymphadenopathy.  She was then placed back on ibrutinib 420mg  daily.  The lymphocytosis, anemia and thrombocytopenia slowly improved.  Due to neutropenia, ibrutinib was placed on hold in December 2017.  The ibrutinib was resumed in January 2018, but discontinued in August 2018 due to toxicities, mainly severe abdominal muscle cramping.  She was hospitalized in early September 2018 with urinary tract infection and pancytopenia.  She received multiple red blood cell and platelet transfusions during her stay.  She was admitted again in late September 2018 with perineal cellulitis extending to the lower abdomen and upper thigh, as well as persistent pancytopenia.  CT abdomen and pelvis revealed interval decrease in the abdominal lymphadenopathy, chronic massive splenomegaly with scattered small splenic infarcts, and small left pleural effusion with bibasilar atelectasis.  She required packed red blood cells while hospitalized.  She was discharged home with home health and [facility].  We continued  to follow her closely, but did not place her on a treatment due to the persistent cellulitis.  She then was admitted in October 2018 with severe hypercalcemia, with a calcium of 15.  She was treated with IV fluids, zoledronic acid and Calcitonin injections.  We continued to monitor her closely.  She had recurrent hypercalcemia, for which she received IV fluids and zolendronic acid as an outpatient.     Due to worsening pancytopenia, she underwent bone marrow biopsy in January 2019.   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  The cellulitis had improved to a point we felt she  could be treated again, so she was placed on venetoclax CLL in January 2019.  She has continued on venetoclax 400 mg daily after ramp up dosing and has tolerated that fairly well, except for mild diarrhea.  Since starting venetoclax, she has had decreasing splenomegaly and improvement in her pancytopenia.  She initially continued to require IV fluids and zoledronic acid for the hypercalcemia.  Her last dose of zoledronic acid was given in March 2019.  In March, she had an irregular heart rhythm and EKG revealed occasional premature atrial complexes, as well as an incomplete right bundle branch block.  This was stable compared to EKG done in October 2018.  Quantitative immunoglobulins done in August 2019 revealed a normal IgG, with low IgA and IgM.  She has continued IVIG monthly.  She had an episode of Campylobacter diarrhea in November 2019, which was treated with azithromycin for 7 days with resolution of her diarrhea. Venetoclax was held temporarily until her diarrhea resolved.  She has since tolerated venetoclax without significant difficulty.     In February 2022, she had a right diagnostic mammogram due to right breast subareolar tenderness, which did not reveal any evidence of malignancy.  CT abdomen in April did not reveal any acute abnormality or other findings to explain the left-sided abdominal pain.  There was resolution of previously seen gross splenomegaly.  There were prominent retroperitoneal lymph nodes, which had significantly diminished in size compared to prior exam.  A hiatal hernia was seen. There was descending colonic diverticulosis without evidence of acute diverticulitis.  Bilateral screening mammogram in May 2022 revealed possible asymmetry in the left breast.  Left diagnostic mammogram and ultrasound in June revealed a cluster of cysts/apocrine cyst in the left breast at 2 o'clock, 3 cm the nipple, measuring 6 x 3 x 6 mm, unchanged from the exam dated May 2021. There were no solid  masses or suspicious lesions. She is up to date on colonoscopy with her last colonoscopy being in June 2022 with Dr. Jennye Boroughs.  This was negative, and repeat in 10 years was advised.    INTERVAL HISTORY:  Asianae is here today for repeat clinical assessment for CLL with combined immunodeficiency after her bone marrow.  As outlined before, she has been through numerous treatments including chlorambucil, fludarabine, Bendamustine, ibrutinib, and now most recently venetoclax for over 5 years.  She had a prior severe allergic reaction to rituximab. Her bone marrow still shows CLL with a normocellular bone marrow of 30% but 20% of the cellularity is her CLL.  Flow cytometry confirms 78% of the lymphocytes are abnormal CD5/CD19 positive B-cell population. 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 Venetoclax was stopped. She now has progression of disease with worsening anemia and thrombocytopenia. She took a course of oral Vancomycin for her  C.diff colitis in May, 2024. She was then placed on Pirtobrutinib in June, 2024. She is tolerating this well and I think she is responding based on the decreased adenopathy and improving thrombocytopenia. She was on 100 mg daily and we increased to 200 mg daily in early July. The patient states that she feels well and has no complaint of pain. She has been going for physiotherapy at the rehab and shows signs of improvement, she is walking without her walker today. Her hemoglobin is 28.1, WBC is 7.8, and platelet count is 27,000 today. Her CMP is normal, but she has mild signs of dehydration with a BUN of 21 and creatinine of 0.7. Her blood glucose is 218 and this might be due to her prednisone. I will reduce the dose of her prednisone to 5 mg daily and taper it down gently. We will stop with the weekly platelet transfusions but she might need the transfusions occasionally. She is on Pirtobrutinib 200 mg daily and she has  been tolerating it well. I will see her  back in 1 week with CBC and CMP. We will continue weekly follow up for the rest of this month and may be able to go to every 2 weeks. We also hope to have her off the prednisone by the end of July. She  denies signs of infections such as sore throat, sinus drainage, cough or urinary symptoms. She  denies fever or recurrent chills. She  also deny nausea, vomiting, chest pain dyspnea or cough. Her  appetite is great and Her  weight has been stable    REVIEW OF SYSTEMS:  Review of Systems  Constitutional:  Negative for appetite change, chills, diaphoresis, fatigue, fever and unexpected weight change.  HENT:  Negative.  Negative for hearing loss, lump/mass, mouth sores, nosebleeds, sore throat, tinnitus, trouble swallowing and voice change.   Eyes: Negative.  Negative for eye problems and icterus.  Respiratory: Negative.  Negative for chest tightness, cough, hemoptysis, shortness of breath and wheezing.   Cardiovascular: Negative.  Negative for chest pain, leg swelling and palpitations.  Gastrointestinal: Negative.  Negative for abdominal distention, abdominal pain, blood in stool, constipation, diarrhea, nausea, rectal pain and vomiting.  Endocrine: Negative.  Negative for hot flashes.  Genitourinary: Negative.  Negative for bladder incontinence, difficulty urinating, dyspareunia, dysuria, frequency, hematuria, menstrual problem, nocturia, pelvic pain, vaginal bleeding and vaginal discharge.   Musculoskeletal:  Positive for arthralgias (in both knees, legs). Negative for back pain, flank pain, gait problem, myalgias, neck pain and neck stiffness.  Skin: Negative.  Negative for itching, rash and wound.  Neurological:  Negative for dizziness, extremity weakness, gait problem, headaches, light-headedness, numbness, seizures and speech difficulty.  Hematological: Negative.  Negative for adenopathy. Does not bruise/bleed easily.  Psychiatric/Behavioral: Negative.   Negative for confusion, decreased concentration, depression, sleep disturbance and suicidal ideas. The patient is not nervous/anxious.      VITALS:  Blood pressure 106/68, pulse (!) 104, temperature 99.1 F (37.3 C), temperature source Oral, resp. rate 18, height 5' 1.5" (1.562 m), weight 193 lb 4.8 oz (87.7 kg), SpO2 97%.  Wt Readings from Last 3 Encounters:  10/22/22 188 lb 12.8 oz (85.6 kg)  10/15/22 193 lb 4.8 oz (87.7 kg)  10/08/22 193 lb 9.6 oz (87.8 kg)    Body mass index is 35.93 kg/m.  Performance status (ECOG): 1 - Symptomatic but completely ambulatory  PHYSICAL EXAM:  Physical Exam Vitals and nursing note reviewed.  Constitutional:      General: She  is not in acute distress.    Appearance: Normal appearance. She is normal weight. She is not ill-appearing, toxic-appearing or diaphoretic.  HENT:     Head: Normocephalic and atraumatic.     Right Ear: Tympanic membrane, ear canal and external ear normal. There is no impacted cerumen.     Left Ear: Tympanic membrane and external ear normal. There is no impacted cerumen.     Nose: Nose normal. No congestion or rhinorrhea.     Mouth/Throat:     Mouth: Mucous membranes are moist.     Pharynx: Oropharynx is clear. No oropharyngeal exudate or posterior oropharyngeal erythema.  Eyes:     General: No scleral icterus.       Right eye: No discharge.        Left eye: No discharge.     Extraocular Movements: Extraocular movements intact.     Conjunctiva/sclera: Conjunctivae normal.     Pupils: Pupils are equal, round, and reactive to light.  Neck:     Vascular: No carotid bruit.  Cardiovascular:     Rate and Rhythm: Normal rate and regular rhythm.     Pulses: Normal pulses.     Heart sounds: Normal heart sounds. No murmur heard.    No friction rub. No gallop.  Pulmonary:     Effort: Pulmonary effort is normal. No respiratory distress.     Breath sounds: Normal breath sounds. No stridor. No wheezing, rhonchi or rales.  Chest:      Chest wall: No tenderness.  Abdominal:     General: Bowel sounds are normal. There is no distension.     Palpations: Abdomen is soft. There is splenomegaly. There is no hepatomegaly or mass.     Tenderness: There is no abdominal tenderness. There is no right CVA tenderness, left CVA tenderness, guarding or rebound.     Hernia: No hernia is present.     Comments: The spleen is just below the left costal margin.  Musculoskeletal:        General: No swelling, tenderness, deformity or signs of injury. Normal range of motion.     Cervical back: Normal range of motion and neck supple. No rigidity or tenderness.     Right lower leg: No edema.     Left lower leg: No edema.  Lymphadenopathy:     Cervical: No cervical adenopathy.     Right cervical: No superficial, deep or posterior cervical adenopathy.    Left cervical: No superficial, deep or posterior cervical adenopathy.     Upper Body:     Right upper body: No supraclavicular, axillary or pectoral adenopathy.     Left upper body: No supraclavicular, axillary or pectoral adenopathy.     Lower Body: No right inguinal adenopathy. No left inguinal adenopathy.  Skin:    General: Skin is warm and dry.     Coloration: Skin is not jaundiced or pale.     Findings: No bruising, ecchymosis, erythema, signs of injury, lesion or rash.  Neurological:     General: No focal deficit present.     Mental Status: She is alert and oriented to person, place, and time. Mental status is at baseline.     Cranial Nerves: No cranial nerve deficit.     Sensory: No sensory deficit.     Motor: No weakness.     Coordination: Coordination normal.     Gait: Gait normal.     Deep Tendon Reflexes: Reflexes normal.  Psychiatric:  Mood and Affect: Mood normal.        Behavior: Behavior normal.        Thought Content: Thought content normal.        Judgment: Judgment normal.    LABS:      Latest Ref Rng & Units 10/22/2022   12:00 AM 10/15/2022   12:00 AM  10/08/2022   12:00 AM  CBC  WBC  12.1     7.8     6.9      Hemoglobin 12.0 - 16.0 9.3     9.4     7.8      Hematocrit 36 - 46 29     28     23       Platelets 150 - 400 K/uL 34     27     19         This result is from an external source.      Latest Ref Rng & Units 10/22/2022   12:00 AM 10/16/2022   12:00 AM 10/15/2022   12:00 AM  CMP  BUN 4 - 21 13      21       Creatinine 0.5 - 1.1 0.7      0.7      Sodium 137 - 147 135      136      Potassium 3.5 - 5.1 mEq/L 4.1      4.3      Chloride 99 - 108 102      102      CO2 13 - 22 16      23       Calcium 8.7 - 10.7 8.7     9.0       Alkaline Phos 25 - 125 89     85       AST 13 - 35 31     33       ALT 7 - 35 U/L 26     23          This result is from an external source.            Component Ref Range & Units 7 d ago (10/08/22) 2 wk ago (10/01/22) 2 wk ago (09/26/22) 1 mo ago (08/28/22) 1 mo ago (08/20/22) 2 mo ago (08/07/22) 2 mo ago (08/02/22)  Albumin 3.5 - 5.0 3.7 3.6 3.2 Abnormal  3.9 3.9 4.1 3.9       No results found for: "CEA1", "CEA" / No results found for: "CEA1", "CEA" No results found for: "PSA1" No results found for: "ZOX096" No results found for: "CAN125"  No results found for: "TOTALPROTELP", "ALBUMINELP", "A1GS", "A2GS", "BETS", "BETA2SER", "GAMS", "MSPIKE", "SPEI" Lab Results  Component Value Date   TIBC 321 07/09/2022   FERRITIN 790 (H) 07/09/2022   IRONPCTSAT 27 07/09/2022   Lab Results  Component Value Date   LDH 378 (H) 07/09/2022    STUDIES:                     HISTORY:   Past Medical History:  Diagnosis Date   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 She has a 15 pack-year smoking history. She has never used smokeless tobacco. She reports current drug use. No history on file for alcohol use.The patient is alone today.  Allergies:  Allergies  Allergen Reactions   Metronidazole     Other reaction(s): Other (See Comments)   Rituximab     Other reaction(s): Other (See Comments)   Sulfa Antibiotics Rash    Current Medications: Current Outpatient Medications  Medication Sig Dispense Refill   predniSONE (DELTASONE) 5 MG tablet Take 5 mg by mouth daily with breakfast.     alendronate (FOSAMAX) 70 MG tablet      allopurinol (ZYLOPRIM) 300 MG tablet Take 1 tablet (300 mg total) by mouth daily. 30 tablet 1   amLODipine (NORVASC) 2.5 MG tablet Take 2.5 mg by mouth daily.     Ascorbic Acid (VITAMIN C WITH ROSE HIPS) 500 MG tablet Take 500 mg by mouth daily.     atorvastatin (LIPITOR) 40 MG tablet Take 40 mg by mouth daily.     calcium carbonate (TUMS - DOSED IN MG ELEMENTAL CALCIUM) 500 MG chewable tablet Chew 1 tablet by mouth 3 (three) times daily.     collagenase (SANTYL) 250 UNIT/GM ointment Apply 1 Application topically daily.     Ergocalciferol 50 MCG (2000 UT) CAPS Take 50 Capfuls by mouth daily.     KLOR-CON M20 20 MEQ tablet Take 1 tablet (20 mEq total) by mouth daily. Patient voiced currently taking once daily provider aware 90 tablet 5   Multiple Vitamin (MULTIVITAMIN WITH MINERALS) TABS tablet Take 1 tablet by mouth daily.     omeprazole (PRILOSEC) 20 MG capsule TAKE 1 CAPSULE BY MOUTH EVERY DAY 90 capsule 3   oxyCODONE (ROXICODONE) 15 MG immediate release tablet Take 1 tablet (15 mg total) by mouth every 6 (six) hours as needed. 100 tablet 0   pirtobrutinib (JAYPIRCA) 100 MG tablet Take 2 tablets (200 mg total) by mouth daily. 60 tablet 5   Current Facility-Administered Medications  Medication Dose Route Frequency Provider  Last Rate Last Admin   sodium chloride flush (NS) 0.9 % injection 10 mL  10 mL Intracatheter PRN [name], MD   10 mL at 10/15/22 1435   Facility-Administered Medications Ordered in Other Visits  Medication Dose Route Frequency Provider Last Rate Last Admin   alum & mag hydroxide-simeth (MAALOX/MYLANTA) 200-200-20 MG/5ML suspension 30 mL  30 mL Oral Once [name], MD       influenza vac split quadrivalent PF (FLUARIX) injection 0.5 mL  0.5 mL Intramuscular Once [name], MD       sodium chloride flush (NS) 0.9 % injection 10 mL  10 mL Intracatheter PRN Belva Crome A, PA-C   10 mL at 09/26/20 1610     I,Oluwatobi Asade,acting as a scribe for [name], MD.,have documented all relevant documentation on the behalf of [name], MD,as directed by  [name], MD while in the presence of [name], MD.

## 2022-10-16 LAB — BASIC METABOLIC PANEL
BUN: 21 (ref 4–21)
CO2: 23 — AB (ref 13–22)
Chloride: 102 (ref 99–108)
Creatinine: 0.7 (ref 0.5–1.1)
Glucose: 218
Potassium: 4.3 mEq/L (ref 3.5–5.1)
Sodium: 136 — AB (ref 137–147)

## 2022-10-16 LAB — HEPATIC FUNCTION PANEL
ALT: 23 U/L (ref 7–35)
AST: 33 (ref 13–35)
Alkaline Phosphatase: 85 (ref 25–125)
Bilirubin, Total: 1.5

## 2022-10-16 LAB — COMPREHENSIVE METABOLIC PANEL
Albumin: 3.9 (ref 3.5–5.0)
Calcium: 9 (ref 8.7–10.7)

## 2022-10-16 LAB — CBC AND DIFFERENTIAL
HCT: 28 — AB (ref 36–46)
Hemoglobin: 9.4 — AB (ref 12.0–16.0)
Neutrophils Absolute: 2.65
Platelets: 27 10*3/uL — AB (ref 150–400)
WBC: 7.8

## 2022-10-16 LAB — CBC: RBC: 3.03 — AB (ref 3.87–5.11)

## 2022-10-18 NOTE — Assessment & Plan Note (Signed)
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  She continues IVIG monthly. Her last dose of IVIG was on July 3, so she will be due again next week.

## 2022-10-18 NOTE — Assessment & Plan Note (Signed)
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  She did not require treatment until 2008 and has since been through multiple therapies.  She has been taking venetoclax 400 mg daily with good control of her disease for over 5 years.  Unfortunately, her disease is progressed with worsening thrombocytopenia and developing lymphadenopathy and splenomegaly, as well as worsening anemia.  Bone marrow still showed CLL with a normocellular bone marrow of 30%, but 20% of the cellularity was her CLL.  Flow cytometry confirms at 78% of the lymphocytes are abnormal CD5/CD19 positive B-cell population.  We did not see evidence of transformation.  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  She was hospitalized in June due to severe weakness and was found to have a CVA.  She had severe thrombocytopenia and anemia requiring daily transfusions. She has an elevated bilirubin.  She had a decrease transfusion requirement after starting pirtobrutinib 50 mg daily.  We have titrated her dose of pirtobrutinb up to 200 mg daily.  She has continued response with no palpable adenopathy or splenomegaly today.  Her white count and bilirubin have increased again at this time, which may be due to whatever acute process she has going on.  She is tolerating the increased dose of pirtobrutinib  200 mg daily.   She remains on prednisone 5 mg daily.  She does not require transfusion this week.  We will continue pirtobrutinib 200 mg daily We will decrease her prednisone 5 mg to 1/2 tab daily.  We will plan to see her in 1 week with a CBC and comprehensive metabolic panel prior to her next IVIG. If all is well, we may be able to space her visits out at that time.

## 2022-10-18 NOTE — Progress Notes (Signed)
[facility]  [address] 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  She did not require treatment until 2008 and has since been through multiple therapies.  She has been taking venetoclax 400 mg daily with good control of her disease for over 5 years.  Unfortunately, her disease is progressed with worsening thrombocytopenia and developing lymphadenopathy and splenomegaly, as well as worsening anemia.  Bone marrow still showed CLL with a normocellular bone marrow of 30%, but 20% of the cellularity was her CLL.  Flow cytometry confirms at 78% of the lymphocytes are abnormal CD5/CD19 positive B-cell population.  We did not see evidence of transformation.  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  She was hospitalized in June due to severe weakness and was found to have a CVA.  She had severe thrombocytopenia and anemia requiring daily transfusions. She has an elevated bilirubin.  She had a decrease transfusion requirement after starting pirtobrutinib 50 mg daily.  We have titrated her dose of pirtobrutinb up to 200 mg daily.  She has continued response with no palpable adenopathy or splenomegaly today.  Her white count and bilirubin have increased again at this time, which may be due to whatever acute process she has going on.  She is tolerating the increased dose of pirtobrutinib  200 mg daily.   She remains on prednisone 5 mg daily.  She does not require transfusion this week.  We will continue pirtobrutinib 200 mg daily We will decrease her prednisone 5 mg to 1/2 tab daily.  We will plan to see her in 1 week with a CBC and comprehensive metabolic panel prior to her next IVIG. If all is  well, we may be able to space her visits out at that time.  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  She continues IVIG monthly. Her last dose of IVIG was on July 3, so she will be due again next week.  Right flank pain New right flank pain initially constant with nausea and vomiting, somewhat improved with supportive fluids and medications. Right CVA tenderness. Will obtain stat CT abdomen with contrast for further evaluation. Repeat urinalysis, urine culture.  She will have to come back to outpatient radiology later today for the CT.   The patient and her son understand the plans discussed today and are in agreement with them.  She knows to contact our office if she develops concerns prior to her next appointment.   I provided 20 minutes of face-to-face time during this encounter and > 50% was spent counseling as documented under my assessment and plan.    [name], PA-C  [facility] [address] [place] [address] Dept: [phone] Dept Fax: [phone]   Orders Placed This Encounter  Procedures   CT ABDOMEN W CONTRAST    Standing Status:   Future    Standing Expiration Date:   10/22/2023    Scheduling Instructions:     RH    Order Specific Question:   If indicated for the ordered procedure, I authorize the administration of contrast media per Radiology protocol    Answer:   Yes  Order Specific Question:   Does the patient have a contrast media/X-ray dye allergy?    Answer:   No    Order Specific Question:   Preferred imaging location?    Answer:   External    Order Specific Question:   If indicated for the ordered procedure, I authorize the administration of oral contrast media per Radiology protocol    Answer:   Yes   CMP ([facility] scanned report) Routine   CBC and differential    This external order was created through the Results Console.    CBC    This external order was created through the Results Console.   Basic metabolic panel    This external order was created through the Results Console.   Comprehensive metabolic panel    This external order was created through the Results Console.   Hepatic function panel    This external order was created through the Results Console.      CHIEF COMPLAINT:  CC: CLL  Current Treatment: Pirtobrutinib 100 mg daily.  HISTORY OF PRESENT ILLNESS:  Samantha Warren is a 77 y.o. 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  She was on observation until November 2008. She was initially treated with chlorambucil and prednisone, as she refused intravenous chemotherapy.  She had a partial response to this regimen.  By January 2010, she had progressive disease with a white count over 200,000, with associated anemia, splenomegaly, thrombocytopenia, and adenopathy.  She then received 5 cycles of fludarabine with good response.  She did well until January 2012, when she had progression once again.  She had a single dose of bendamustine and rituximab, which kept her disease under control for over a year.  However, she had a severe allergic reaction to the rituximab, so has not received further rituximab.  She was hospitalized after the bendamustine because of severe pancytopenia and required multiple transfusions, so was placed on observation.  In March 2013, she had progression of disease again, so was treated with bendamustine for 6 cycles, at a 50% dose reduction, and once again had a excellent response.  She was on observation until August 2014, then had progression of disease, so was placed on ibrutinib 420 mg daily.  The ibrutinib had kept her disease under fairly good control, but then she develops severe bilateral lower extremity cellulitis in April 2016, so ibrutinib was placed on hold.  She had persistent cellulitis, as well as  Clostridium difficile colitis in  May 2016 requiring hospitalization.  She was readmitted soon after discharge with worsening cellulitis, requiring a prolonged hospitalization.  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 She eventually had resolution of the cellulitis   As she was largely asymptomatic, we continued observation until February 2017, at which time she had worsening splenomegaly.  Repeat CT imaging in February showed progressive disease, with marked splenomegaly and bulky lymphadenopathy.  She was then placed back on ibrutinib 420mg  daily.  The lymphocytosis, anemia and thrombocytopenia slowly improved.  Due to neutropenia, ibrutinib was placed on hold in December 2017.  The ibrutinib was resumed in January 2018, but discontinued in August 2018 due to toxicities, mainly severe abdominal muscle cramping.  She was hospitalized in early September 2018 with urinary tract infection and pancytopenia.  She received multiple red blood cell and platelet transfusions during her stay.  She was admitted again in late September 2018 with  perineal cellulitis extending to the lower abdomen and upper thigh, as well as persistent pancytopenia.  CT abdomen and pelvis revealed interval decrease in the abdominal lymphadenopathy, chronic massive splenomegaly with scattered small splenic infarcts, and small left pleural effusion with bibasilar atelectasis.  She required packed red blood cells while hospitalized.  She was discharged home with home health and [facility]  We continued to follow her closely, but did not place her on a treatment due to the persistent cellulitis.  She then was admitted in October 2018 with severe hypercalcemia, with a calcium of 15.  She was treated with IV fluids, zoledronic acid and Calcitonin injections.  We continued to monitor her closely.  She had recurrent hypercalcemia, for which she received IV fluids and  zolendronic acid as an outpatient.     Due to worsening pancytopenia, she underwent bone marrow biopsy in January 2019.   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  The cellulitis had improved to a point we felt she could be treated again, so she was placed on venetoclax CLL in January 2019.  She has continued on venetoclax 400 mg daily after ramp up dosing and has tolerated that fairly well, except for mild diarrhea.  Since starting venetoclax, she has had decreasing splenomegaly and improvement in her pancytopenia.  She initially continued to require IV fluids and zoledronic acid for the hypercalcemia.  Her last dose of zoledronic acid was given in March 2019.  In March, she had an irregular heart rhythm and EKG revealed occasional premature atrial complexes, as well as an incomplete right bundle branch block.  This was stable compared to EKG done in October 2018.  Quantitative immunoglobulins done in August 2019 revealed a normal IgG, with low IgA and IgM.  She has continued IVIG monthly.  She had an episode of Campylobacter diarrhea in November 2019, which was treated with azithromycin for 7 days with resolution of her diarrhea. Venetoclax was held temporarily until her diarrhea resolved.  She has since tolerated venetoclax without significant difficulty.     In February 2022, she had a right diagnostic mammogram due to right breast subareolar tenderness, which did not reveal any evidence of malignancy.  CT abdomen in April did not reveal any acute abnormality or other findings to explain the left-sided abdominal pain.  There was resolution of previously seen gross splenomegaly.  There were prominent retroperitoneal lymph nodes, which had significantly diminished in size compared to prior exam.  A hiatal hernia was seen. There was descending colonic diverticulosis without evidence of acute diverticulitis.  Bilateral screening mammogram  in May 2022 revealed possible asymmetry in the left breast.  Left diagnostic mammogram and ultrasound in June revealed a cluster of cysts/apocrine cyst in the left breast at 2 o'clock, 3 cm the nipple, measuring 6 x 3 x 6 mm, unchanged from the exam dated May 2021. There were no solid masses or suspicious lesions. She is up to date on colonoscopy with her last colonoscopy being in June 2022 with Dr. Jennye Boroughs.  This was negative, and repeat in 10 years was advised.    She had worsening anemia and thrombocytopenia, felt to be most likely due to progressive chronic lymphocytic leukemia in April.  Venetoclax was discontinued.  Bone marrow biopsy was performed in interventional radiology in April.  Pathology was consistent with CLL with a normocellular bone marrow of 30%, but 20% of the cellularity was her CLL.  Flow cytometry confirms  at 78% of the lymphocytes are abnormal CD5/CD19 positive B-cell population.  There was not evidence of transformation.  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  She was hospitalized in May with multifocal anemia.  Also had recurrent C. difficile.  She was transfused packed red blood cells and platelets during that admission.  She was hospitalized again in June due to severe weakness.  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  She had severe thrombocytopenia and anemia requiring daily transfusions.  She had severe hematuria which required continuous bladder irrigation, but finally resolved.  She was placed on high-dose prednisone and we have continued to taper.  She was started on pirtobrutinib 50 mg daily, then this was increased to 100 mg daily.  She eventually had decreased transfusion requirements and was able to be discharged to rehab.  We have been seeing her weekly for continued supportive care.  We increased the pirtobrutinib to the full dose of 200 mg daily on July 9.  INTERVAL HISTORY:  Eriona is here today for continued  supportive care.  She remains at rehab.  She states she continues pirtobrutinib 100 mg, 2 tablets daily. She remains on prednisone 5 mg daily. Since her last visit, she developed severe right flank pain with associated nausea and vomiting so did not eat for 2 days.  She states she had a KUB x-ray, EKG and urine test at rehab.  She states she was given IV fluids, pain medication, and nausea medication with improvement, but not resolution of her right flank pain. She denies dysuria, hematuria, frequency or urgency of urination.  She denies fevers or chills. She denies pain. Her appetite is improving. Her weight has decreased 5 pounds over last week .    REVIEW OF SYSTEMS:  Review of Systems  Constitutional:  Positive for appetite change, fatigue and unexpected weight change. Negative for chills and fever.  HENT:   Negative for lump/mass, mouth sores and sore throat.   Respiratory:  Negative for cough and shortness of breath.   Cardiovascular:  Negative for chest pain and leg swelling.  Gastrointestinal:  Negative for abdominal pain, constipation, diarrhea, nausea and vomiting.  Endocrine: Negative for hot flashes.  Genitourinary:  Negative for difficulty urinating, dysuria, frequency and hematuria.   Musculoskeletal:  Positive for flank pain (right). Negative for arthralgias, back pain and myalgias.  Skin:  Negative for rash.  Neurological:  Negative for dizziness and headaches.  Hematological:  Negative for adenopathy. Does not bruise/bleed easily.  Psychiatric/Behavioral:  Negative for depression and sleep disturbance. The patient is not nervous/anxious.      VITALS:  Blood pressure 132/65, pulse (!) 115, temperature (!) 96.5 F (35.8 C), temperature source Oral, resp. rate 20, weight 188 lb 12.8 oz (85.6 kg), SpO2 100%.  Repeat after rest revealed a pulse of 115 and respirations 20. Wt Readings from Last 3 Encounters:  10/22/22 188 lb 12.8 oz (85.6 kg)  10/15/22 193 lb 4.8 oz (87.7 kg)   10/08/22 193 lb 9.6 oz (87.8 kg)    Body mass index is 35.1 kg/m.  Performance status (ECOG): 2 - Symptomatic, <50% confined to bed  PHYSICAL EXAM:  Physical Exam Vitals and nursing note reviewed.  Constitutional:      General: She is not in acute distress.    Appearance: Normal appearance.  HENT:     Head: Normocephalic and atraumatic.     Mouth/Throat:     Mouth: Mucous membranes are  moist.     Pharynx: Oropharynx is clear. No oropharyngeal exudate or posterior oropharyngeal erythema.  Eyes:     General: No scleral icterus.    Extraocular Movements: Extraocular movements intact.     Conjunctiva/sclera: Conjunctivae normal.     Pupils: Pupils are equal, round, and reactive to light.  Cardiovascular:     Rate and Rhythm: Normal rate and regular rhythm.     Heart sounds: Normal heart sounds. No murmur heard.    No friction rub. No gallop.  Pulmonary:     Effort: Pulmonary effort is normal.     Breath sounds: Normal breath sounds. No wheezing, rhonchi or rales.  Abdominal:     General: There is no distension.     Palpations: Abdomen is soft. There is no hepatomegaly, splenomegaly or mass.     Tenderness: There is no abdominal tenderness. There is right CVA tenderness.  Musculoskeletal:        General: Normal range of motion.     Cervical back: Normal range of motion and neck supple. No tenderness.     Right lower leg: No edema.     Left lower leg: No edema.  Lymphadenopathy:     Cervical: No cervical adenopathy.     Upper Body:     Right upper body: No supraclavicular or axillary adenopathy.     Left upper body: No supraclavicular or axillary adenopathy.  Skin:    General: Skin is warm and dry.     Coloration: Skin is not jaundiced.     Findings: No rash.  Neurological:     Mental Status: She is alert and oriented to person, place, and time.     Cranial Nerves: No cranial nerve deficit.  Psychiatric:        Mood and Affect: Mood normal.        Behavior: Behavior  normal.        Thought Content: Thought content normal.     LABS:      Latest Ref Rng & Units 10/22/2022   12:00 AM 10/16/2022   12:00 AM 10/08/2022   12:00 AM  CBC  WBC  12.1     7.8     6.9      Hemoglobin 12.0 - 16.0 9.3     9.4     7.8      Hematocrit 36 - 46 29     28     23       Platelets 150 - 400 K/uL 34     27     19         This result is from an external source.      Latest Ref Rng & Units 10/22/2022   12:00 AM 10/16/2022   12:00 AM 10/08/2022   12:00 AM  CMP  BUN 4 - 21 13     21     23       Creatinine 0.5 - 1.1 0.7     0.7     0.8      Sodium 137 - 147 135     136     138      Potassium 3.5 - 5.1 mEq/L 4.1     4.3     4.0      Chloride 99 - 108 102     102     106      CO2 13 - 22 16     23      23  Calcium 8.7 - 10.7 8.7     9.0     8.8      Alkaline Phos 25 - 125 89     85     88      AST 13 - 35 31     33     25      ALT 7 - 35 U/L 26     23     17          This result is from an external source.     No results found for: "CEA1", "CEA" / No results found for: "CEA1", "CEA" No results found for: "PSA1" No results found for: "AVW098" No results found for: "CAN125"  No results found for: "TOTALPROTELP", "ALBUMINELP", "A1GS", "A2GS", "BETS", "BETA2SER", "GAMS", "MSPIKE", "SPEI" Lab Results  Component Value Date   TIBC 321 07/09/2022   FERRITIN 790 (H) 07/09/2022   IRONPCTSAT 27 07/09/2022   Lab Results  Component Value Date   LDH 378 (H) 07/09/2022    STUDIES:  No results found.    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 She has a 15 pack-year smoking history. She has never used smokeless tobacco. She reports current drug use. No history on file for alcohol use.The patient is accompanied by, her son Ethelene Browns, today.  Allergies:  Allergies  Allergen Reactions   Metronidazole     Other reaction(s): Other (See Comments)   Rituximab     Other reaction(s): Other (See Comments)   Sulfa Antibiotics Rash    Current Medications: Current Outpatient Medications  Medication Sig Dispense Refill   alendronate (FOSAMAX) 70 MG tablet      allopurinol (ZYLOPRIM) 300 MG tablet Take 1 tablet (300 mg total) by mouth daily. 30 tablet 1   amLODipine (NORVASC) 2.5 MG tablet Take 2.5 mg by mouth daily.     Ascorbic Acid (VITAMIN C WITH ROSE HIPS) 500 MG tablet Take 500 mg by mouth daily.     atorvastatin (LIPITOR) 40 MG tablet Take 40 mg by mouth daily.     calcium carbonate (TUMS - DOSED IN MG ELEMENTAL CALCIUM) 500 MG chewable tablet Chew 1 tablet by mouth 3 (three) times daily.     collagenase (SANTYL) 250 UNIT/GM ointment Apply 1 Application topically daily.     Ergocalciferol 50 MCG (2000 UT) CAPS Take 50 Capfuls by mouth daily.     KLOR-CON M20 20 MEQ tablet Take 1 tablet (20 mEq total) by mouth daily. Patient voiced currently taking once daily provider aware 90 tablet 5   Multiple Vitamin (MULTIVITAMIN WITH MINERALS) TABS tablet Take 1 tablet by mouth daily.     omeprazole (PRILOSEC) 20 MG capsule TAKE 1 CAPSULE BY MOUTH EVERY DAY 90 capsule 3   oxyCODONE (ROXICODONE) 15 MG immediate release tablet Take 1 tablet (15 mg total) by mouth every 6 (six) hours as needed. 100  tablet 0   pirtobrutinib (JAYPIRCA) 100 MG tablet Take 2 tablets (200 mg total) by mouth daily. 60 tablet 5   predniSONE (DELTASONE) 5 MG tablet Take 5 mg by mouth daily with breakfast.     No current facility-administered medications for this visit.   Facility-Administered Medications  Ordered in Other Visits  Medication Dose Route Frequency Provider Last Rate Last Admin   alum & mag hydroxide-simeth (MAALOX/MYLANTA) 200-200-20 MG/5ML suspension 30 mL  30 mL Oral Once [name], MD       influenza vac split quadrivalent PF (FLUARIX) injection 0.5 mL  0.5 mL Intramuscular Once [name], MD       sodium chloride flush (NS) 0.9 % injection 10 mL  10 mL Intracatheter PRN ,  A, PA-C   10 mL at 09/26/20 0839   sodium chloride flush (NS) 0.9 % injection 10 mL  10 mL Intracatheter PRN [name], MD   10 mL at 10/15/22 1435

## 2022-10-22 ENCOUNTER — Encounter: Payer: Self-pay | Admitting: Hematology and Oncology

## 2022-10-22 ENCOUNTER — Inpatient Hospital Stay (INDEPENDENT_AMBULATORY_CARE_PROVIDER_SITE_OTHER): Payer: Medicare Other | Admitting: Hematology and Oncology

## 2022-10-22 ENCOUNTER — Inpatient Hospital Stay: Payer: Medicare Other

## 2022-10-22 VITALS — BP 132/65 | HR 115 | Temp 96.5°F | Resp 20 | Wt 188.8 lb

## 2022-10-22 DIAGNOSIS — C9110 Chronic lymphocytic leukemia of B-cell type not having achieved remission: Secondary | ICD-10-CM

## 2022-10-22 DIAGNOSIS — D61818 Other pancytopenia: Secondary | ICD-10-CM

## 2022-10-22 DIAGNOSIS — D819 Combined immunodeficiency, unspecified: Secondary | ICD-10-CM | POA: Diagnosis not present

## 2022-10-22 DIAGNOSIS — R109 Unspecified abdominal pain: Secondary | ICD-10-CM

## 2022-10-22 LAB — CBC AND DIFFERENTIAL
HCT: 29 — AB (ref 36–46)
Hemoglobin: 9.3 — AB (ref 12.0–16.0)
LYMPH%: 72 %
MCV: 96 (ref 81–99)
MONO%: 5 %
NEUT%: 23 %
Neutrophils Absolute: 2.66
Platelets: 34 10*3/uL — AB (ref 150–400)
WBC: 12.1

## 2022-10-22 LAB — SAMPLE TO BLOOD BANK

## 2022-10-22 LAB — COMPREHENSIVE METABOLIC PANEL
Albumin: 4 (ref 3.5–5.0)
Calcium: 8.7 (ref 8.7–10.7)

## 2022-10-22 LAB — BASIC METABOLIC PANEL
BUN: 13 (ref 4–21)
CO2: 16 (ref 13–22)
Chloride: 102 (ref 99–108)
Creatinine: 0.7 (ref 0.5–1.1)
Glucose: 165
Potassium: 4.1 mEq/L (ref 3.5–5.1)
Sodium: 135 — AB (ref 137–147)

## 2022-10-22 LAB — HEPATIC FUNCTION PANEL
ALT: 26 U/L (ref 7–35)
AST: 31 (ref 13–35)
Alkaline Phosphatase: 89 (ref 25–125)
Bilirubin, Total: 2

## 2022-10-22 LAB — CBC: RBC: 2.99 — AB (ref 3.87–5.11)

## 2022-10-22 NOTE — Progress Notes (Signed)
Family member Crystal will transport patient to outpatient CT today- SNF notified.

## 2022-10-22 NOTE — Progress Notes (Signed)
SNF contacted for Urine Specimen results. Staff member Phil Dopp states that "something happened" and they did not have urine specimen results- States that they sent one this am. New specimen collected her and urine sent to lab

## 2022-10-22 NOTE — Assessment & Plan Note (Addendum)
New right flank pain initially constant with nausea and vomiting, somewhat improved with supportive fluids and medications. Right CVA tenderness. Will obtain stat CT abdomen with contrast for further evaluation. Repeat urinalysis, urine culture.  She will have to come back to outpatient radiology later today for the CT.

## 2022-10-22 NOTE — Progress Notes (Signed)
Contacted SNF to determine if they could transport patient to CT this pm. Facility to call back

## 2022-10-22 NOTE — Progress Notes (Signed)
Patient reports intermittent right flank pain that is absent now- States that she also has nausea and vomiting during the pain episodes- Cecelia Byars aware. Son in room with patient.

## 2022-10-23 ENCOUNTER — Telehealth: Payer: Self-pay

## 2022-10-23 NOTE — Telephone Encounter (Signed)
Orders faxed to [phone] [facility] rehab for antibiotics.Augmentin 875mg  BID for 10 days for suspected UTI, CT report of abdomen and pelvis . Also called [facility] with this information.

## 2022-10-25 ENCOUNTER — Telehealth: Payer: Self-pay

## 2022-10-25 NOTE — Telephone Encounter (Signed)
-----   Message from [name] sent at 10/25/2022  8:31 AM EDT ----- Please let rehab know that her UTI is resistant to ampicillin. D/C Augmentin and start levofloxacin 500 mg daily for 7 days today. Please fax order. Thanks

## 2022-10-25 NOTE — Telephone Encounter (Signed)
Antibiotic changes faxed to [facility] rehab and called to verify they received order changes and culture report.

## 2022-10-29 ENCOUNTER — Encounter: Payer: Self-pay | Admitting: Oncology

## 2022-10-29 ENCOUNTER — Inpatient Hospital Stay: Payer: Medicare Other

## 2022-10-29 ENCOUNTER — Inpatient Hospital Stay (INDEPENDENT_AMBULATORY_CARE_PROVIDER_SITE_OTHER): Payer: Medicare Other | Admitting: Oncology

## 2022-10-29 VITALS — BP 107/56 | HR 123 | Temp 100.3°F | Resp 18 | Ht 61.5 in | Wt 188.6 lb

## 2022-10-29 DIAGNOSIS — C9110 Chronic lymphocytic leukemia of B-cell type not having achieved remission: Secondary | ICD-10-CM | POA: Diagnosis not present

## 2022-10-29 DIAGNOSIS — R509 Fever, unspecified: Secondary | ICD-10-CM

## 2022-10-29 DIAGNOSIS — D801 Nonfamilial hypogammaglobulinemia: Secondary | ICD-10-CM | POA: Diagnosis not present

## 2022-10-29 DIAGNOSIS — D61818 Other pancytopenia: Secondary | ICD-10-CM

## 2022-10-29 LAB — BASIC METABOLIC PANEL
BUN: 11 (ref 4–21)
CO2: 20 (ref 13–22)
Chloride: 97 — AB (ref 99–108)
Creatinine: 0.7 (ref 0.5–1.1)
Glucose: 108
Potassium: 4.6 mEq/L (ref 3.5–5.1)
Sodium: 131 — AB (ref 137–147)

## 2022-10-29 LAB — HEPATIC FUNCTION PANEL
ALT: 12 U/L (ref 7–35)
AST: 46 — AB (ref 13–35)
Alkaline Phosphatase: 129 — AB (ref 25–125)
Bilirubin, Total: 2.5

## 2022-10-29 LAB — CBC AND DIFFERENTIAL
HCT: 23 — AB (ref 36–46)
Hemoglobin: 7.4 — AB (ref 12.0–16.0)
Neutrophils Absolute: 2.18
Platelets: 16 10*3/uL — AB (ref 150–400)
WBC: 6.8

## 2022-10-29 LAB — COMPREHENSIVE METABOLIC PANEL
Albumin: 3.5 (ref 3.5–5.0)
Calcium: 8.6 — AB (ref 8.7–10.7)

## 2022-10-29 LAB — CBC: RBC: 2.38 — AB (ref 3.87–5.11)

## 2022-10-29 MED ORDER — HEPARIN SOD (PORK) LOCK FLUSH 100 UNIT/ML IV SOLN
500.0000 [IU] | Freq: Once | INTRAVENOUS | Status: AC | PRN
  Administered 2022-10-29: 500 [IU]

## 2022-10-29 MED ORDER — SODIUM CHLORIDE 0.9% FLUSH
10.0000 mL | Freq: Once | INTRAVENOUS | Status: AC | PRN
  Administered 2022-10-29: 10 mL

## 2022-10-29 MED FILL — Immune Globulin (Human) IV Soln 10 GM/100ML: INTRAVENOUS | Qty: 450 | Status: AC

## 2022-10-29 NOTE — Progress Notes (Signed)
CRITICAL VALUE STICKER  CRITICAL VALUE:  Plt 16  RECEIVER (on-site recipient of call):  Dyane Dustman RN  DATE & TIME NOTIFIED:   10/29/2022 @ 1405  MESSENGER (representative from lab):  [facility]  MD NOTIFIED:   Dr. Gilman Buttner  TIME OF NOTIFICATION: 1406  RESPONSE:  In seeing patient now. Patient was directly admitted to [facility]

## 2022-10-29 NOTE — Progress Notes (Signed)
[facility]  [address] 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  She did not require treatment until 2008 and has since been through multiple therapies.  She has been taking venetoclax 400 mg daily with good control of her disease for over 5 years.  Unfortunately her disease is now progressed with worsening thrombocytopenia and developing lymphadenopathy as well as worsening anemia.  She has now had a bone marrow which still shows CLL with a normal cellular bone marrow of 30% but 20% of the cellularity is her CLL.  Flow cytometry confirms at 78% of the lymphocytes are abnormal CD5/CD19 positive B-cell population.  We do not see evidence of transformation.  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 Venetoclax was stopped and she is now on pirtobrutinib 100 mg twice daily, and increased to 200 mg twice daily. She does have improvement in her adenopathy of the supraclavicular and cervical areas, and splenomegaly.    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 She is severely immunocompromised so I am very concerned about her fever and decrease in all blood counts.   Thrombocytopenia Her platelet count has slowly declined over the last few months and was 38,000, then down to 26,000, and now down to 8,000. She ended up hospitalized with a stroke and was transfusion dependent almost daily. I did put her on a trial of steroids and she had no response of the platelets or red cells. We are now tapering that dose and she is down to 20mg  daily. She was placed on Pirtobrutinib initially at 50 mg daily and then  increased to 100mg  daily, and then to 200 mg twice daily. She is tolerating that well. We will plan weekly monitoring of her blood. Her platelet count was improved for the last 2 weeks, up to 34, 000, but now has decreased to 16,000 along with fever and likely infection.   Worsening anemia. Her hemoglobin is up to 8.8 now so she will not receive RBC's. She did receive transfusion while in the hospital of 14 units  PRBC's and 15 units of platelets. Her hemoglobin improved to 9.2 but has dropped again to 7.6.  CVA She had a stroke in June with numbness of her left chin and left hemiparesis. She was finally discharged to a rehab facility and is making progress with PT. Her neurologic deficit is steadily improving and she is now in rehab.  Plan:  She continues to take the pirtobrutinib 200 mg twice daily. On July 24th she went to the ER for an episode of transient aphasia. She had  CT scan of her head on July 24th, 2024, which revealed no evidence of acute intracranial abnormality. We felt that  she was stable enough not to require admission or extensive evaluation as all of that had been done in June. While in the ER her platelet count was 35,000, her hemoglobin was 9.3, WBC was 9,000 with ANC of 2,000. She has not had any transfusions in 2 weeks. She developed a fever today and is feeling very sick. Her temperature is 100.3. She complains of dizziness and a funny taste in her mouth. She has no complaint of sore  throat, cough or shortness of breath, stomach pain or vomiting. She has no sores on her skin. She had loose bowels this morning and when I asked her, it happened twice. Now she has urgency to 'poop' right away so I am concerned about possible recurrence of C. dif. Colitis.  I have ordered a stool culture, C dif testing, blood culture and urine culture.  I think she needs to get admitted today because it is a serious infection in a very immunocompromised host, but she says she wants to go home. I  convinced her to stay so more investigations can be carried out and she agreed. She will be admitted to the hospital for urine and stool cultures and initiation of appropriate antibiotics. She will also need a platelet transfusion  Her hemoglobin is 7.4, WBC is 6.8, and platelet count is 16,000 today. Her CMP is pending today.  I will plan to follow along while she is in the hospital.  I will see her back in 1 week with CBC, CMP and type and hold. The patient understand the plans discussed today and are in agreement with them.  I have answered their questions.  She knows to contact our office if she develops concerns prior to her next appointment.  I provided >50 minutes of face-to-face time during this encounter and > 50% was spent counseling as documented under my assessment and plan.    Samantha Beckwith, MD  [facility] AT [facility] [address] [place] [address] Dept: [phone] Dept Fax: [phone]   Orders Placed This Encounter  Procedures   Culture, blood (routine x 2)    Standing Status:   Standing    Number of Occurrences:   1    Order Specific Question:   Patient immune status    Answer:   Immunocompromised   Culture, Urine   Urinalysis, Complete w Microscopic   Blood transfusion report - scanned     CHIEF COMPLAINT:  CC: CLL with combined immunodeficiency  Current Treatment: IVIG  HISTORY OF PRESENT ILLNESS:  Samantha Warren is a 77 y.o. 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  She was on observation until November 2008. She was initially treated with chlorambucil and prednisone, as she refused intravenous chemotherapy.  She had a partial response to this regimen.  By January 2010, she had progressive disease with a white count over 200,000, with associated anemia, splenomegaly, thrombocytopenia, and adenopathy.  She then received 5 cycles of fludarabine with good  response.  She did well until January 2012, when she had progression once again.  She had a single dose of bendamustine and rituximab, which kept her disease under control for over a year.  However, she had a severe allergic reaction to the rituximab, so has not received further rituximab.  She was hospitalized after the bendamustine because of severe pancytopenia and required multiple transfusions, so was placed on observation.  In March 2013, she had progression of disease again, so was treated with bendamustine for 6 cycles, at a 50% dose reduction, and once again had a excellent response.  She was on observation until August 2014, then had progression of disease, so was placed on ibrutinib 420 mg daily.  The ibrutinib had kept her disease under fairly good control, but then she develops severe bilateral lower extremity cellulitis in April 2016, so ibrutinib was placed on hold.  She had persistent cellulitis, as well as  Clostridium  difficile colitis in May 2016 requiring hospitalization.  She was readmitted soon after discharge with worsening cellulitis, requiring a prolonged hospitalization.  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 She eventually had resolution of the cellulitis   As she was largely asymptomatic, we continued observation until February 2017, at which time she had worsening splenomegaly.  Repeat CT imaging in February showed progressive disease, with marked splenomegaly and bulky lymphadenopathy.  She was then placed back on ibrutinib 420mg  daily.  The lymphocytosis, anemia and thrombocytopenia slowly improved.  Due to neutropenia, ibrutinib was placed on hold in December 2017.  The ibrutinib was resumed in January 2018, but discontinued in August 2018 due to toxicities, mainly severe abdominal muscle cramping.  She was hospitalized in early September 2018 with urinary tract infection and pancytopenia.  She  received multiple red blood cell and platelet transfusions during her stay.  She was admitted again in late September 2018 with perineal cellulitis extending to the lower abdomen and upper thigh, as well as persistent pancytopenia.  CT abdomen and pelvis revealed interval decrease in the abdominal lymphadenopathy, chronic massive splenomegaly with scattered small splenic infarcts, and small left pleural effusion with bibasilar atelectasis.  She required packed red blood cells while hospitalized.  She was discharged home with home health and [facility]  We continued to follow her closely, but did not place her on a treatment due to the persistent cellulitis.  She then was admitted in October 2018 with severe hypercalcemia, with a calcium of 15.  She was treated with IV fluids, zoledronic acid and Calcitonin injections.  We continued to monitor her closely.  She had recurrent hypercalcemia, for which she received IV fluids and zolendronic acid as an outpatient.     Due to worsening pancytopenia, she underwent bone marrow biopsy in January 2019.   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  The cellulitis had improved to a point we felt she could be treated again, so she was placed on venetoclax CLL in January 2019.  She has continued on venetoclax 400 mg daily after ramp up dosing and has tolerated that fairly well, except for mild diarrhea.  Since starting venetoclax, she has had decreasing splenomegaly and improvement in her pancytopenia.  She initially continued to require IV fluids and zoledronic acid for the hypercalcemia.  Her last dose of zoledronic acid was given in March 2019.  In March, she had an irregular heart rhythm and EKG revealed occasional premature atrial complexes, as well as an incomplete right bundle branch block.  This was stable compared to EKG done in October 2018.  Quantitative immunoglobulins  done in August 2019 revealed a normal IgG, with low IgA and IgM.  She has continued IVIG monthly.  She had an episode of Campylobacter diarrhea in November 2019, which was treated with azithromycin for 7 days with resolution of her diarrhea. Venetoclax was held temporarily until her diarrhea resolved.  She has since tolerated venetoclax without significant difficulty.     In February 2022, she had a right diagnostic mammogram due to right breast subareolar tenderness, which did not reveal any evidence of malignancy.  CT abdomen in April did not reveal any acute abnormality or other findings to explain the left-sided abdominal pain.  There was resolution of previously seen gross splenomegaly.  There were prominent retroperitoneal lymph nodes, which had significantly diminished in  size compared to prior exam.  A hiatal hernia was seen. There was descending colonic diverticulosis without evidence of acute diverticulitis.  Bilateral screening mammogram in May 2022 revealed possible asymmetry in the left breast.  Left diagnostic mammogram and ultrasound in June revealed a cluster of cysts/apocrine cyst in the left breast at 2 o'clock, 3 cm the nipple, measuring 6 x 3 x 6 mm, unchanged from the exam dated May 2021. There were no solid masses or suspicious lesions. She is up to date on colonoscopy with her last colonoscopy being in June 2022 with Dr. Jennye Boroughs.  This was negative, and repeat in 10 years was advised.    INTERVAL HISTORY:  Samantha Warren is here today for repeat clinical assessment for CLL with combined immunodeficiency. As outlined before, she has been through numerous treatments including chlorambucil, fludarabine, Bendamustine, ibrutinib, and now most recently venetoclax for over 5 years.  She had a prior severe allergic reaction to rituximab. Her bone marrow from April, 2024 still shows CLL with a normocellular bone marrow of 30% but 20% of the cellularity is her CLL.  Flow cytometry confirms 78% of the  lymphocytes are abnormal CD5/CD19 positive B-cell population. 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 Venetoclax was stopped. She now has progression of disease with worsening anemia and thrombocytopenia. She took a course of oral Vancomycin for her C.dif colitis in May, 2024. She was then placed on Pirtobrutinib in June, 2024. She is tolerating this well and I think she is responding based on the decreased adenopathy.  I increased her dose to 200 mg twice daily.   The patient states that she feels very sick today. On July 24th she went to the ER for an episode of transient aphasia. She had  CT scan of her head on July 24th, 2024, which revealed no evidence of acute intracranial abnormality. We felt that  she was stable enough not to require admission or extensive evaluation as all of that had been done in June. While in the ER her platelet count was 35,000, her hemoglobin was 9.3, WBC was 9,000 with ANC of 2,000. She has not had any transfusions in 2 weeks. She developed a fever today and is feeling very sick. Her temperature is 100.3. She complains of dizziness and a funny taste in her mouth. She has no complaint of sore throat, cough or shortness of breath, stomach pain or vomiting. She has no sores on her skin. She had loose bowels this morning and when I asked her, it happened twice. Now she has urgency to 'poop' right away so I am concerned about possible recurrence of C. dif. I have ordered a stool culture, C dif testing, blood culture and urine culture.  I think she needs to get admitted today because it is from form of serious infection in an immunocompromised host, but she says she wants to go home. I convinced her to stay so more investigations can be carried out and she agreed, she will be admitted to the hospital for urine and stool cultures and initiation of appropriate antibiotics. She will also need a platelet transfusion.    Her  hemoglobin is 7.4, WBC is 6.8, and platelet count is 16,000 today. Her CMP is pending today. I will see her back in 1 week with CBC, CMP and type and hold. She  denies signs of infections such as sore throat, sinus drainage, cough or urinary symptoms. She  denies fever or  recurrent chills. She  also deny nausea, vomiting, chest pain, or dyspnea. Her  appetite is fair and Her  weight has been stable     REVIEW OF SYSTEMS:  Review of Systems  Constitutional:  Positive for appetite change (decreased appetite) and fever. Negative for chills, diaphoresis, fatigue and unexpected weight change.  HENT:  Negative.  Negative for hearing loss, lump/mass, mouth sores, nosebleeds, sore throat, tinnitus, trouble swallowing and voice change.   Eyes: Negative.  Negative for eye problems and icterus.  Respiratory: Negative.  Negative for chest tightness, cough, hemoptysis, shortness of breath and wheezing.   Cardiovascular: Negative.  Negative for chest pain, leg swelling and palpitations.  Gastrointestinal:  Positive for diarrhea. Negative for abdominal distention, abdominal pain, blood in stool, constipation, nausea, rectal pain and vomiting.  Endocrine: Negative.  Negative for hot flashes.  Genitourinary: Negative.  Negative for bladder incontinence, difficulty urinating, dyspareunia, dysuria, frequency, hematuria, menstrual problem, nocturia, pelvic pain, vaginal bleeding and vaginal discharge.   Musculoskeletal:  Positive for arthralgias (in both knees, legs). Negative for back pain, flank pain, gait problem, myalgias, neck pain and neck stiffness.  Skin: Negative.  Negative for itching, rash and wound.  Neurological:  Positive for dizziness and speech difficulty (transient aphasia last week, seen in the ER). Negative for extremity weakness, gait problem, headaches, light-headedness, numbness and seizures.  Hematological: Negative.  Negative for adenopathy. Does not bruise/bleed easily.  Psychiatric/Behavioral:  Negative.  Negative for confusion, decreased concentration, depression, sleep disturbance and suicidal ideas. The patient is not nervous/anxious.      VITALS:  Blood pressure (!) 107/56, pulse (!) 123, temperature 100.3 F (37.9 C), temperature source Oral, resp. rate 18, height 5' 1.5" (1.562 m), weight 188 lb 9.6 oz (85.5 kg), SpO2 99%.  Wt Readings from Last 3 Encounters:  10/29/22 188 lb 9.6 oz (85.5 kg)  10/22/22 188 lb 12.8 oz (85.6 kg)  10/15/22 193 lb 4.8 oz (87.7 kg)    Body mass index is 35.06 kg/m.  Performance status (ECOG): 1 - Symptomatic but completely ambulatory  PHYSICAL EXAM:  Physical Exam Vitals and nursing note reviewed.  Constitutional:      General: She is not in acute distress.    Appearance: Normal appearance. She is normal weight. She is not ill-appearing, toxic-appearing or diaphoretic.  HENT:     Head: Normocephalic and atraumatic.     Right Ear: Tympanic membrane, ear canal and external ear normal. There is no impacted cerumen.     Left Ear: Tympanic membrane and external ear normal. There is no impacted cerumen.     Nose: Nose normal. No congestion or rhinorrhea.     Mouth/Throat:     Mouth: Mucous membranes are moist.     Pharynx: Oropharynx is clear. No oropharyngeal exudate or posterior oropharyngeal erythema.  Eyes:     General: No scleral icterus.       Right eye: No discharge.        Left eye: No discharge.     Extraocular Movements: Extraocular movements intact.     Conjunctiva/sclera: Conjunctivae normal.     Pupils: Pupils are equal, round, and reactive to light.  Neck:     Vascular: No carotid bruit.  Cardiovascular:     Rate and Rhythm: Normal rate and regular rhythm.     Pulses: Normal pulses.     Heart sounds: Normal heart sounds. No murmur heard.    No friction rub. No gallop.  Pulmonary:     Effort: Pulmonary effort  is normal. No respiratory distress.     Breath sounds: Normal breath sounds. No stridor. No wheezing, rhonchi or  rales.  Chest:     Chest wall: No tenderness.  Abdominal:     General: Bowel sounds are normal. There is no distension.     Palpations: Abdomen is soft. There is splenomegaly. There is no hepatomegaly or mass.     Tenderness: There is no abdominal tenderness. There is no right CVA tenderness, left CVA tenderness, guarding or rebound.     Hernia: No hernia is present.     Comments: The spleen is 10 cm below the left costal margin.  Musculoskeletal:        General: No swelling, tenderness, deformity or signs of injury. Normal range of motion.     Cervical back: Normal range of motion and neck supple. No rigidity or tenderness.     Right lower leg: No edema.     Left lower leg: No edema.     Comments: Trace edema in her lower extremities and chronic venous stasis changes  Lymphadenopathy:     Cervical: No cervical adenopathy.     Right cervical: No superficial, deep or posterior cervical adenopathy.    Left cervical: No superficial, deep or posterior cervical adenopathy.     Upper Body:     Right upper body: No supraclavicular, axillary or pectoral adenopathy.     Left upper body: No supraclavicular, axillary or pectoral adenopathy.     Lower Body: No right inguinal adenopathy. No left inguinal adenopathy.  Skin:    General: Skin is warm and dry.     Coloration: Skin is not jaundiced or pale.     Findings: Ecchymosis (Excessive ecchymosis of the left anti cubital fossa) present. No bruising, erythema, lesion or rash.  Neurological:     General: No focal deficit present.     Mental Status: She is alert and oriented to person, place, and time. Mental status is at baseline.     Cranial Nerves: No cranial nerve deficit.     Sensory: No sensory deficit.     Motor: No weakness.     Coordination: Coordination normal.     Gait: Gait normal.     Deep Tendon Reflexes: Reflexes normal.  Psychiatric:        Mood and Affect: Mood normal.        Behavior: Behavior normal.        Thought  Content: Thought content normal.        Judgment: Judgment normal.    LABS:      Latest Ref Rng & Units 10/22/2022   12:00 AM 10/15/2022   12:00 AM 10/08/2022   12:00 AM  CBC  WBC  12.1     7.8     6.9      Hemoglobin 12.0 - 16.0 9.3     9.4     7.8      Hematocrit 36 - 46 29     28     23       Platelets 150 - 400 K/uL 34     27     19         This result is from an external source.      Latest Ref Rng & Units 10/22/2022   12:00 AM 10/16/2022   12:00 AM 10/15/2022   12:00 AM  CMP  BUN 4 - 21 13      21       Creatinine  0.5 - 1.1 0.7      0.7      Sodium 137 - 147 135      136      Potassium 3.5 - 5.1 mEq/L 4.1      4.3      Chloride 99 - 108 102      102      CO2 13 - 22 16      23       Calcium 8.7 - 10.7 8.7     9.0       Alkaline Phos 25 - 125 89     85       AST 13 - 35 31     33       ALT 7 - 35 U/L 26     23          This result is from an external source.   Component Ref Range & Units 7 d ago (10/22/22) 13 d ago (10/16/22) 3 wk ago (10/08/22) 4 wk ago (10/01/22) 1 mo ago (09/26/22) 2 mo ago (08/28/22) 2 mo ago (08/20/22)  Calcium 8.7 - 10.7 8.7 9.0 8.8 9.2 9.0 9.8 9.5  Albumin 3.5 - 5.0 4.0 3.9 3.7 3.6 3.2 Abnormal  3.9 3.9     No results found for: "CEA1", "CEA" / No results found for: "CEA1", "CEA" No results found for: "PSA1" No results found for: "NWG956" No results found for: "CAN125"  No results found for: "TOTALPROTELP", "ALBUMINELP", "A1GS", "A2GS", "BETS", "BETA2SER", "GAMS", "MSPIKE", "SPEI" Lab Results  Component Value Date   TIBC 321 07/09/2022   FERRITIN 790 (H) 07/09/2022   IRONPCTSAT 27 07/09/2022   Lab Results  Component Value Date   LDH 378 (H) 07/09/2022    STUDIES:   EXAM: 10/23/2022 CT HEAD WITHOUT CONTRAST IMPRESSION  No evidence of acute intracranial abnormality ASPECTS is 10                   HISTORY:   Past Medical History:  Diagnosis Date   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 She has a 15 pack-year smoking history. She has never used smokeless tobacco. She reports current drug use. No history on file for alcohol use.The patient is alone today.  Allergies:  Allergies  Allergen Reactions   Metronidazole     Other reaction(s): Other (See Comments)   Rituximab     Other reaction(s): Other (See Comments)   Sulfa Antibiotics Rash    Current Medications: Current Outpatient Medications  Medication Sig Dispense Refill   alendronate (FOSAMAX) 70 MG tablet      allopurinol (ZYLOPRIM) 300 MG tablet Take 1 tablet (300 mg total) by mouth daily. 30 tablet 1   amLODipine (NORVASC) 2.5 MG tablet Take 2.5 mg by mouth daily.     Ascorbic Acid (VITAMIN C WITH ROSE HIPS) 500 MG tablet Take 500 mg by mouth daily.     atorvastatin (LIPITOR) 40  MG tablet Take 40 mg by mouth daily.     calcium carbonate (TUMS - DOSED IN MG ELEMENTAL CALCIUM) 500 MG chewable tablet Chew 1 tablet by mouth 3 (three) times daily.     collagenase (SANTYL) 250 UNIT/GM ointment Apply 1 Application topically daily.     Ergocalciferol 50 MCG (2000 UT) CAPS Take 50 Capfuls by mouth daily.     KLOR-CON M20 20 MEQ tablet Take 1 tablet (20 mEq total) by mouth daily. Patient voiced currently taking once daily provider aware 90 tablet 5   Multiple Vitamin (MULTIVITAMIN WITH MINERALS) TABS tablet Take 1 tablet by mouth daily.     omeprazole (PRILOSEC) 20 MG capsule TAKE 1 CAPSULE BY MOUTH EVERY DAY 90 capsule 3   oxyCODONE (ROXICODONE) 15 MG immediate release tablet Take 1 tablet (15 mg total) by mouth  every 6 (six) hours as needed. 100 tablet 0   pirtobrutinib (JAYPIRCA) 100 MG tablet Take 2 tablets (200 mg total) by mouth daily. 60 tablet 5   predniSONE (DELTASONE) 5 MG tablet Take 5 mg by mouth daily with breakfast.     No current facility-administered medications for this visit.   Facility-Administered Medications Ordered in Other Visits  Medication Dose Route Frequency Provider Last Rate Last Admin   alum & mag hydroxide-simeth (MAALOX/MYLANTA) 200-200-20 MG/5ML suspension 30 mL  30 mL Oral Once Samantha Beckwith, MD       influenza vac split quadrivalent PF (FLUARIX) injection 0.5 mL  0.5 mL Intramuscular Once Samantha Beckwith, MD       sodium chloride flush (NS) 0.9 % injection 10 mL  10 mL Intracatheter PRN Belva Crome A, PA-C   10 mL at 09/26/20 2841     I,Oluwatobi Asade,acting as a scribe for Samantha Beckwith, MD.,have documented all relevant documentation on the behalf of Samantha Beckwith, MD,as directed by  Samantha Beckwith, MD while in the presence of Samantha Beckwith, MD.

## 2022-10-30 ENCOUNTER — Encounter: Payer: Self-pay | Admitting: Oncology

## 2022-10-30 ENCOUNTER — Inpatient Hospital Stay: Payer: Medicare Other

## 2022-10-30 DIAGNOSIS — C9110 Chronic lymphocytic leukemia of B-cell type not having achieved remission: Secondary | ICD-10-CM

## 2022-10-31 DIAGNOSIS — C9110 Chronic lymphocytic leukemia of B-cell type not having achieved remission: Secondary | ICD-10-CM | POA: Diagnosis not present

## 2022-11-01 DIAGNOSIS — C9110 Chronic lymphocytic leukemia of B-cell type not having achieved remission: Secondary | ICD-10-CM | POA: Diagnosis not present

## 2022-11-05 ENCOUNTER — Other Ambulatory Visit: Payer: Self-pay | Admitting: Pharmacist

## 2022-11-05 ENCOUNTER — Other Ambulatory Visit (HOSPITAL_COMMUNITY): Payer: Self-pay

## 2022-11-05 ENCOUNTER — Inpatient Hospital Stay: Payer: Medicare Other | Attending: Hematology and Oncology

## 2022-11-05 ENCOUNTER — Encounter: Payer: Self-pay | Admitting: Oncology

## 2022-11-05 ENCOUNTER — Inpatient Hospital Stay (INDEPENDENT_AMBULATORY_CARE_PROVIDER_SITE_OTHER): Payer: Medicare Other | Admitting: Oncology

## 2022-11-05 VITALS — BP 106/60 | HR 114 | Temp 99.7°F | Resp 20 | Ht 61.5 in | Wt 186.8 lb

## 2022-11-05 DIAGNOSIS — D801 Nonfamilial hypogammaglobulinemia: Secondary | ICD-10-CM

## 2022-11-05 DIAGNOSIS — C9110 Chronic lymphocytic leukemia of B-cell type not having achieved remission: Secondary | ICD-10-CM

## 2022-11-05 DIAGNOSIS — D61818 Other pancytopenia: Secondary | ICD-10-CM | POA: Diagnosis not present

## 2022-11-05 DIAGNOSIS — D819 Combined immunodeficiency, unspecified: Secondary | ICD-10-CM | POA: Diagnosis not present

## 2022-11-05 LAB — COMPREHENSIVE METABOLIC PANEL
Albumin: 3.4 — AB (ref 3.5–5.0)
Calcium: 9.4 (ref 8.7–10.7)

## 2022-11-05 LAB — SAMPLE TO BLOOD BANK

## 2022-11-05 LAB — BASIC METABOLIC PANEL
BUN: 11 (ref 4–21)
CO2: 17 (ref 13–22)
Chloride: 103 (ref 99–108)
Creatinine: 0.7 (ref 0.5–1.1)
Glucose: 122
Potassium: 3.9 mEq/L (ref 3.5–5.1)
Sodium: 135 — AB (ref 137–147)

## 2022-11-05 LAB — TYPE AND SCREEN
ABO/RH(D): B NEG
Antibody Screen: NEGATIVE
Unit division: 0

## 2022-11-05 LAB — CBC AND DIFFERENTIAL
HCT: 22 — AB (ref 36–46)
Hemoglobin: 7.4 — AB (ref 12.0–16.0)
Neutrophils Absolute: 1.12
Platelets: 6 10*3/uL — AB (ref 150–400)
WBC: 7

## 2022-11-05 LAB — BPAM RBC
Blood Product Expiration Date: 202409032359
Unit Type and Rh: 9500

## 2022-11-05 LAB — CBC: RBC: 2.4 — AB (ref 3.87–5.11)

## 2022-11-05 LAB — PREPARE RBC (CROSSMATCH)

## 2022-11-05 LAB — HEPATIC FUNCTION PANEL
ALT: 13 U/L (ref 7–35)
AST: 39 — AB (ref 13–35)
Alkaline Phosphatase: 182 — AB (ref 25–125)
Bilirubin, Total: 2.3

## 2022-11-05 MED ORDER — HEPARIN SOD (PORK) LOCK FLUSH 100 UNIT/ML IV SOLN
500.0000 [IU] | Freq: Once | INTRAVENOUS | Status: AC | PRN
  Administered 2022-11-05: 500 [IU]

## 2022-11-05 MED ORDER — SODIUM CHLORIDE 0.9% FLUSH
10.0000 mL | INTRAVENOUS | Status: DC | PRN
  Administered 2022-11-05: 10 mL

## 2022-11-05 NOTE — Progress Notes (Signed)
[facility]  [address] 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  She did not require treatment until 2008 and has since been through multiple therapies.  She has been taking venetoclax 400 mg daily with good control of her disease for over 5 years.  Unfortunately her disease is now progressed with worsening thrombocytopenia and developing lymphadenopathy as well as worsening anemia.  She has now had a bone marrow which still shows CLL with a normal cellular bone marrow of 30% but 20% of the cellularity is her CLL.  Flow cytometry confirms at 78% of the lymphocytes are abnormal CD5/CD19 positive B-cell population.  We do not see evidence of transformation.  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 Venetoclax was stopped and she is now on pirtobrutinib 100 mg daily, and increased to 200 mg daily. She does have improvement in her adenopathy of the supraclavicular and cervical areas, and splenomegaly. She had improvement in her thrombocytopenia but now her platelets have dropped again and she is transfusion dependent. She has petechiae but no excessive bleeding.   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 She was due last week so we will proceed tomorrow.   Thrombocytopenia Her platelet count has slowly declined over the last few months and was 38,000, then down to 26,000, and now down to 8,000. She ended up hospitalized with a stroke and was transfusion dependent almost daily. I did put her on a trial of steroids and she had no response of the platelets or red cells. We are now tapering that dose and  she is down to 20mg  daily. She was placed on Pirtobrutinib initially at 50 mg daily and then increased to 100mg  daily, and then to 200 mg daily. She is tolerating that well. We will plan weekly monitoring of her blood. Her platelet count was improved for 2 weeks, up to 34, 000, but then has decreased to 16,000 along with fever and likely infection. Today it is even lower at 6,000 and she does have petechiae but no excess bleeding.   Worsening anemia. She did receive transfusion while in the hospital in June of 14 units  PRBC's and 15 units of platelets. Her hemoglobin improved to 9.2 but has dropped again to 7.4 so she will receive 1 unit of packed red blood cells.  CVA She had a stroke in June with numbness of her left chin and left hemiparesis. She was finally discharged to a rehab facility and is making progress with PT. Her neurologic deficit is steadily improving and she has now gone home. She was ambulatory but has increased weakness after this recent hospitalization. I think she needs home physical therapy.  Decubiti These developed during her July hospitalization. She will need home health nurses to help with this care.  Plan:   She had  CT scan of her head on July 24th, 2024, which revealed no evidence of acute intracranial abnormality. She was admitted in the hospital a week ago on 10/29/2022, for acute diarrhea, fever and generalized weakness. Her blood cultures were negative but she was placed on empiric antibiotics, vancomycin/cefepime, and prednisone 20 mg twice daily was resumed. Testing for C.diff was  negative. She was also transfused with PRBC's and platelets. The diarrhea is now resolved and her appetite has improved. She still has some episodes of shortness of breath and fatigue. Her hemoglobin is 7.4, WBC is 7.0 with an ANC of 1,540, and platelet count is 6,000 today, down from 16,000. Her CMP is  pending today. She is due for her monthly IVIG tomorrow. She will also have 1 unit of  platelets and 1 unit of red blood cells. I counseled her son Thayer Ohm that she might be responding well to the cancer treatment but is difficult to tell, and we will monitor her weekly and give it more time. She has some improvement in her adenopathy and splenomegaly and a transient improvement in her thrombocytopenia, but now the platelets are low again. I will see her  back in 1 week with CBC, CMP and type and hold and we will schedule transfusions every Wednesday. The patient understand the plans discussed today and are in agreement with them.  I have answered their questions.  She knows to contact our office if she develops concerns prior to her next appointment.  I provided 25 minutes of face-to-face time during this encounter and > 50% was spent counseling as documented under my assessment and plan.    [name], MD  [facility] [address] [place] [address] Dept: [phone] Dept Fax: [phone]   No orders of the defined types were placed in this encounter.    CHIEF COMPLAINT:  CC: CLL with combined immunodeficiency  Current Treatment: IVIG  HISTORY OF PRESENT ILLNESS:  Samantha Warren is a 77 y.o. 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  She was on observation until November 2008. She was initially treated with chlorambucil and prednisone, as she refused intravenous chemotherapy.  She had a partial response to this regimen.  By January 2010, she had progressive disease with a white count over 200,000, with associated anemia, splenomegaly, thrombocytopenia, and adenopathy.  She then received 5 cycles of fludarabine with good response.  She did well until January 2012, when she had progression once again.  She had a single dose of bendamustine and rituximab, which kept her disease under control for over a year.  However, she had a severe allergic reaction to the  rituximab, so has not received further rituximab.  She was hospitalized after the bendamustine because of severe pancytopenia and required multiple transfusions, so was placed on observation.  In March 2013, she had progression of disease again, so was treated with bendamustine for 6 cycles, at a 50% dose reduction, and once again had a excellent response.  She was on observation until August 2014, then had progression of disease, so was placed on ibrutinib 420 mg daily.  The ibrutinib had kept her disease under fairly good control, but then she develops severe bilateral lower extremity cellulitis in April 2016, so ibrutinib was placed on hold.  She had persistent cellulitis, as well as  Clostridium difficile colitis in May 2016 requiring hospitalization.  She was readmitted soon after discharge with worsening cellulitis, requiring a prolonged hospitalization.  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 She eventually had resolution of the cellulitis   As she was largely asymptomatic, we continued observation until February 2017, at which time she had worsening splenomegaly.  Repeat CT imaging in February showed progressive  disease, with marked splenomegaly and bulky lymphadenopathy.  She was then placed back on ibrutinib 420mg  daily.  The lymphocytosis, anemia and thrombocytopenia slowly improved.  Due to neutropenia, ibrutinib was placed on hold in December 2017.  The ibrutinib was resumed in January 2018, but discontinued in August 2018 due to toxicities, mainly severe abdominal muscle cramping.  She was hospitalized in early September 2018 with urinary tract infection and pancytopenia.  She received multiple red blood cell and platelet transfusions during her stay.  She was admitted again in late September 2018 with perineal cellulitis extending to the lower abdomen and upper thigh, as well as persistent pancytopenia.  CT  abdomen and pelvis revealed interval decrease in the abdominal lymphadenopathy, chronic massive splenomegaly with scattered small splenic infarcts, and small left pleural effusion with bibasilar atelectasis.  She required packed red blood cells while hospitalized.  She was discharged home with home health and [facility]  We continued to follow her closely, but did not place her on a treatment due to the persistent cellulitis.  She then was admitted in October 2018 with severe hypercalcemia, with a calcium of 15.  She was treated with IV fluids, zoledronic acid and Calcitonin injections.  We continued to monitor her closely.  She had recurrent hypercalcemia, for which she received IV fluids and zolendronic acid as an outpatient.     Due to worsening pancytopenia, she underwent bone marrow biopsy in January 2019.   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  The cellulitis had improved to a point we felt she could be treated again, so she was placed on venetoclax CLL in January 2019.  She has continued on venetoclax 400 mg daily after ramp up dosing and has tolerated that fairly well, except for mild diarrhea.  Since starting venetoclax, she has had decreasing splenomegaly and improvement in her pancytopenia.  She initially continued to require IV fluids and zoledronic acid for the hypercalcemia.  Her last dose of zoledronic acid was given in March 2019.  In March, she had an irregular heart rhythm and EKG revealed occasional premature atrial complexes, as well as an incomplete right bundle branch block.  This was stable compared to EKG done in October 2018.  Quantitative immunoglobulins done in August 2019 revealed a normal IgG, with low IgA and IgM.  She has continued IVIG monthly.  She had an episode of Campylobacter diarrhea in November 2019, which was treated with azithromycin for 7 days with resolution of her  diarrhea. Venetoclax was held temporarily until her diarrhea resolved.  She has since tolerated venetoclax without significant difficulty.     In February 2022, she had a right diagnostic mammogram due to right breast subareolar tenderness, which did not reveal any evidence of malignancy.  CT abdomen in April did not reveal any acute abnormality or other findings to explain the left-sided abdominal pain.  There was resolution of previously seen gross splenomegaly.  There were prominent retroperitoneal lymph nodes, which had significantly diminished in size compared to prior exam.  A hiatal hernia was seen. There was descending colonic diverticulosis without evidence of acute diverticulitis.  Bilateral screening mammogram in May 2022 revealed possible asymmetry in the left breast.  Left diagnostic mammogram and ultrasound in June revealed a cluster of cysts/apocrine cyst in the left breast at 2 o'clock, 3 cm the nipple, measuring 6 x 3 x 6 mm, unchanged from the exam dated May 2021. There were no solid  masses or suspicious lesions. She is up to date on colonoscopy with her last colonoscopy being in June 2022 with Dr. Jennye Boroughs.  This was negative, and repeat in 10 years was advised.    INTERVAL HISTORY:  Samantha Warren is here today for repeat clinical assessment for CLL with combined immunodeficiency. As outlined before, she has been through numerous treatments including chlorambucil, fludarabine, Bendamustine, ibrutinib, and now most recently venetoclax for over 5 years.  She had a prior severe allergic reaction to rituximab. Her bone marrow from April, 2024 still shows CLL with a normocellular bone marrow of 30% but 20% of the cellularity is her CLL.  Flow cytometry confirms 78% of the lymphocytes are abnormal CD5/CD19 positive B-cell population. 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 Venetoclax was stopped. She now has progression of disease with  worsening anemia and thrombocytopenia. She took a course of oral Vancomycin for her C.dif colitis in May, 2024. She was then placed on Pirtobrutinib in June, 2024. She is tolerating this well and I think she is responding based on the decreased adenopathy.  I increased her dose to 200 mg daily. She was admitted in the hospital a week ago, on 10/29/2022, for acute diarrhea, fever and generalized weakness. Her blood cultures were negative but she was placed on empiric antibiotics, vancomycin/cefepime, and placed back on prednisone 20 mg twice daily. Testing for C.diff was negative. She was also transfused with PRBC's and platelets. The diarrhea is now resolved and her appetite has improved. She still has some episodes of shortness of breath and fatigue. Her hemoglobin is 7.4, WBC is 7.0 with an ANC of 1,540, and platelet count is 6,000 today, down from 16,000. Her CMP is  pending today. She is due for her monthly IVIG tomorrow. She will also have 1 unit of platelets and 1 unit of red blood cells. I counseled her son Thayer Ohm that she might be responding well to the cancer treatment but it is difficult to tell, and we will monitor her weekly and give it more time. I will see her  back in 1 week with CBC, CMP and type and hold and we will schedule transfusions every Wednesday. She  denies signs of infections such as sore throat, sinus drainage, cough or urinary symptoms. She  denies fever or recurrent chills. She  also deny nausea, vomiting, chest pain, or dyspnea. Her  appetite is improved and Her  weight has decreased 2 pounds over last 1 week  This patient is accompanied in the office by her  son .     REVIEW OF SYSTEMS:  Review of Systems  Constitutional:  Positive for fatigue. Negative for appetite change, chills, diaphoresis, fever and unexpected weight change.  HENT:  Negative.  Negative for hearing loss, lump/mass, mouth sores, nosebleeds, sore throat, tinnitus, trouble swallowing and voice change.   Eyes:  Negative.  Negative for eye problems and icterus.  Respiratory:  Positive for shortness of breath. Negative for chest tightness, cough, hemoptysis and wheezing.   Cardiovascular: Negative.  Negative for chest pain, leg swelling and palpitations.  Gastrointestinal:  Negative for abdominal distention, abdominal pain, blood in stool, constipation, diarrhea, nausea, rectal pain and vomiting.  Endocrine: Negative.  Negative for hot flashes.  Genitourinary: Negative.  Negative for bladder incontinence, difficulty urinating, dyspareunia, dysuria, frequency, hematuria, menstrual problem, nocturia, pelvic pain, vaginal bleeding and vaginal discharge.   Musculoskeletal:  Positive for arthralgias (in both knees, legs). Negative for back  pain, flank pain, gait problem, myalgias, neck pain and neck stiffness.  Skin: Negative.  Negative for itching, rash and wound.  Neurological:  Positive for dizziness. Negative for extremity weakness, gait problem, headaches, light-headedness, numbness, seizures and speech difficulty.  Hematological:  Negative for adenopathy. Bruises/bleeds easily.  Psychiatric/Behavioral: Negative.  Negative for confusion, decreased concentration, depression, sleep disturbance and suicidal ideas. The patient is not nervous/anxious.      VITALS:  Blood pressure 106/60, pulse (!) 114, temperature 99.7 F (37.6 C), temperature source Oral, resp. rate 20, height 5' 1.5" (1.562 m), weight 186 lb 12.8 oz (84.7 kg), SpO2 100%.  Wt Readings from Last 3 Encounters:  11/05/22 186 lb 12.8 oz (84.7 kg)  10/29/22 188 lb 9.6 oz (85.5 kg)  10/22/22 188 lb 12.8 oz (85.6 kg)    Body mass index is 34.72 kg/m.  Performance status (ECOG): 1 - Symptomatic but completely ambulatory  PHYSICAL EXAM:  Physical Exam Vitals and nursing note reviewed.  Constitutional:      General: She is not in acute distress.    Appearance: Normal appearance. She is normal weight. She is not ill-appearing, toxic-appearing  or diaphoretic.  HENT:     Head: Normocephalic and atraumatic.     Right Ear: Tympanic membrane, ear canal and external ear normal. There is no impacted cerumen.     Left Ear: Tympanic membrane and external ear normal. There is no impacted cerumen.     Nose: Nose normal. No congestion or rhinorrhea.     Mouth/Throat:     Mouth: Mucous membranes are moist.     Pharynx: Oropharynx is clear. No oropharyngeal exudate or posterior oropharyngeal erythema.  Eyes:     General: No scleral icterus.       Right eye: No discharge.        Left eye: No discharge.     Extraocular Movements: Extraocular movements intact.     Conjunctiva/sclera: Conjunctivae normal.     Pupils: Pupils are equal, round, and reactive to light.  Neck:     Vascular: No carotid bruit.  Cardiovascular:     Rate and Rhythm: Normal rate and regular rhythm.     Pulses: Normal pulses.     Heart sounds: Normal heart sounds. No murmur heard.    No friction rub. No gallop.  Pulmonary:     Effort: Pulmonary effort is normal. No respiratory distress.     Breath sounds: Normal breath sounds. No stridor. No wheezing, rhonchi or rales.  Chest:     Chest wall: No tenderness.  Abdominal:     General: Bowel sounds are normal. There is no distension.     Palpations: Abdomen is soft. There is splenomegaly. There is no hepatomegaly or mass.     Tenderness: There is no abdominal tenderness. There is no right CVA tenderness, left CVA tenderness, guarding or rebound.     Hernia: No hernia is present.     Comments: Moderate splenomegaly  Musculoskeletal:        General: No swelling, tenderness, deformity or signs of injury. Normal range of motion.     Cervical back: Normal range of motion and neck supple. No rigidity or tenderness.     Right lower leg: No edema.     Left lower leg: No edema.     Comments: Trace edema in her lower extremities and chronic venous stasis changes  Lymphadenopathy:     Cervical: No cervical adenopathy.      Right cervical: No superficial, deep or  posterior cervical adenopathy.    Left cervical: No superficial, deep or posterior cervical adenopathy.     Upper Body:     Right upper body: No supraclavicular, axillary or pectoral adenopathy.     Left upper body: No supraclavicular, axillary or pectoral adenopathy.     Lower Body: No right inguinal adenopathy. No left inguinal adenopathy.  Skin:    General: Skin is warm and dry.     Coloration: Skin is not jaundiced or pale.     Findings: Ecchymosis (Excessive ecchymosis of the left anti cubital fossa) and petechiae (of both forearms) present. No bruising, erythema, lesion or rash. Wound: of both forearms. Neurological:     General: No focal deficit present.     Mental Status: She is alert and oriented to person, place, and time. Mental status is at baseline.     Cranial Nerves: No cranial nerve deficit.     Sensory: No sensory deficit.     Motor: No weakness.     Coordination: Coordination normal.     Gait: Gait normal.     Deep Tendon Reflexes: Reflexes normal.  Psychiatric:        Mood and Affect: Mood normal.        Behavior: Behavior normal.        Thought Content: Thought content normal.        Judgment: Judgment normal.    LABS:      Latest Ref Rng & Units 11/05/2022   12:00 AM 10/29/2022   12:00 AM 10/22/2022   12:00 AM  CBC  WBC  7.0     6.8     12.1      Hemoglobin 12.0 - 16.0 7.4     7.4     9.3      Hematocrit 36 - 46 22     23     29       Platelets 150 - 400 K/uL 6     16     34         This result is from an external source.      Latest Ref Rng & Units 11/05/2022   12:00 AM 10/29/2022   12:00 AM 10/22/2022   12:00 AM  CMP  BUN 4 - 21 11     11     13       Creatinine 0.5 - 1.1 0.7     0.7     0.7      Sodium 137 - 147 135     131     135      Potassium 3.5 - 5.1 mEq/L 3.9     4.6     4.1      Chloride 99 - 108 103     97     102      CO2 13 - 22 17     20     16       Calcium 8.7 - 10.7 9.4     8.6     8.7       Alkaline Phos 25 - 125 182     129     89      AST 13 - 35 39     46     31      ALT 7 - 35 U/L 13     12     26          This result is from an external  source.   Component Ref Range & Units 7 d ago (10/29/22) 2 wk ago (10/22/22) 2 wk ago (10/16/22) 4 wk ago (10/08/22) 1 mo ago (10/01/22) 1 mo ago (09/26/22) 2 mo ago (08/28/22)  Calcium 8.7 - 10.7 8.6 Abnormal  8.7 9.0 8.8 9.2 9.0 9.8  Albumin 3.5 - 5.0 3.5 4.0 3.9 3.7 3.6 3.2 Abnormal  3.9     No results found for: "CEA1", "CEA" / No results found for: "CEA1", "CEA" No results found for: "PSA1" No results found for: "ZOX096" No results found for: "CAN125"  No results found for: "TOTALPROTELP", "ALBUMINELP", "A1GS", "A2GS", "BETS", "BETA2SER", "GAMS", "MSPIKE", "SPEI" Lab Results  Component Value Date   TIBC 321 07/09/2022   FERRITIN 790 (H) 07/09/2022   IRONPCTSAT 27 07/09/2022   Lab Results  Component Value Date   LDH 378 (H) 07/09/2022    STUDIES:   EXAM: 10/23/2022 CT HEAD WITHOUT CONTRAST IMPRESSION  No evidence of acute intracranial abnormality ASPECTS is 10                   HISTORY:   Past Medical History:  Diagnosis Date   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 She has a 15 pack-year smoking history. She has never used smokeless tobacco. She reports current drug use. No history on file for alcohol use.The patient is alone today.  Allergies:  Allergies  Allergen Reactions   Metronidazole     Other reaction(s): Other (See Comments)   Rituximab     Other  reaction(s): Other (See Comments)   Sulfa Antibiotics Rash    Current Medications: Current Outpatient Medications  Medication Sig Dispense Refill   alendronate (FOSAMAX) 70 MG tablet      allopurinol (ZYLOPRIM) 300 MG tablet Take 1 tablet (300 mg total) by mouth daily. 30 tablet 1   amLODipine (NORVASC) 2.5 MG tablet Take 2.5 mg by mouth daily.     Ascorbic Acid (VITAMIN C WITH ROSE HIPS) 500 MG tablet Take 500 mg by mouth daily.     atorvastatin (LIPITOR) 40 MG tablet Take 40 mg by mouth daily.     calcium carbonate (TUMS - DOSED IN MG ELEMENTAL CALCIUM) 500 MG chewable tablet Chew 1 tablet by mouth 3 (three) times daily.     collagenase (SANTYL) 250 UNIT/GM ointment Apply 1 Application topically daily.     Ergocalciferol 50 MCG (2000 UT) CAPS Take 50 Capfuls by mouth daily.     KLOR-CON M20 20 MEQ tablet Take 1 tablet (20 mEq total) by mouth daily. Patient voiced currently taking once daily provider aware 90 tablet 5   Multiple Vitamin (MULTIVITAMIN WITH MINERALS) TABS tablet Take 1 tablet by mouth daily.     omeprazole (PRILOSEC) 20 MG capsule TAKE 1 CAPSULE BY MOUTH EVERY DAY 90 capsule 3   oxyCODONE (ROXICODONE) 15 MG immediate release tablet Take 1 tablet (15 mg total) by mouth every 6 (six) hours as needed. 100 tablet 0  pirtobrutinib (JAYPIRCA) 100 MG tablet Take 2 tablets (200 mg total) by mouth daily. 60 tablet 5   predniSONE (DELTASONE) 5 MG tablet Take 5 mg by mouth daily with breakfast.     Current Facility-Administered Medications  Medication Dose Route Frequency Provider Last Rate Last Admin   sodium chloride flush (NS) 0.9 % injection 10 mL  10 mL Intracatheter PRN [name], MD   10 mL at 11/05/22 0849   Facility-Administered Medications Ordered in Other Visits  Medication Dose Route Frequency Provider Last Rate Last Admin   alum & mag hydroxide-simeth (MAALOX/MYLANTA) 200-200-20 MG/5ML suspension 30 mL  30 mL Oral Once [name], MD        influenza vac split quadrivalent PF (FLUARIX) injection 0.5 mL  0.5 mL Intramuscular Once [name], MD       sodium chloride flush (NS) 0.9 % injection 10 mL  10 mL Intracatheter PRN Belva Crome A, PA-C   10 mL at 09/26/20 0981     I,Oluwatobi Asade,acting as a scribe for [name], MD.,have documented all relevant documentation on the behalf of [name], MD,as directed by  [name], MD while in the presence of [name], MD.

## 2022-11-05 NOTE — Progress Notes (Signed)
CRITICAL VALUE STICKER  CRITICAL VALUE:  Plt 6, Hgb 7.4  RECEIVER (on-site recipient of call):  Dyane Dustman RN  DATE & TIME NOTIFIED:   11/05/2022 @ [phone]  MESSENGER (representative from lab):  [facility]  MD NOTIFIED:   Dr. Gilman Buttner  TIME OF NOTIFICATION:  9604  RESPONSE: Patient scheduled for 1 unit platelet and 1 unit PRBC at [facility] location on 11/06/2022.

## 2022-11-06 ENCOUNTER — Other Ambulatory Visit: Payer: Self-pay | Admitting: Hematology and Oncology

## 2022-11-06 ENCOUNTER — Inpatient Hospital Stay: Payer: Medicare Other

## 2022-11-06 VITALS — BP 104/51 | HR 95 | Temp 98.0°F | Resp 18

## 2022-11-06 DIAGNOSIS — D801 Nonfamilial hypogammaglobulinemia: Secondary | ICD-10-CM

## 2022-11-06 DIAGNOSIS — D819 Combined immunodeficiency, unspecified: Secondary | ICD-10-CM | POA: Diagnosis not present

## 2022-11-06 DIAGNOSIS — C9110 Chronic lymphocytic leukemia of B-cell type not having achieved remission: Secondary | ICD-10-CM

## 2022-11-06 DIAGNOSIS — D61818 Other pancytopenia: Secondary | ICD-10-CM

## 2022-11-06 MED ORDER — DIPHENHYDRAMINE HCL 25 MG PO CAPS
25.0000 mg | ORAL_CAPSULE | Freq: Once | ORAL | Status: AC
  Administered 2022-11-06: 25 mg via ORAL
  Filled 2022-11-06: qty 1

## 2022-11-06 MED ORDER — HEPARIN SOD (PORK) LOCK FLUSH 100 UNIT/ML IV SOLN
500.0000 [IU] | Freq: Every day | INTRAVENOUS | Status: AC | PRN
  Administered 2022-11-06: 500 [IU]

## 2022-11-06 MED ORDER — ACETAMINOPHEN 325 MG PO TABS
650.0000 mg | ORAL_TABLET | Freq: Once | ORAL | Status: AC
  Administered 2022-11-06: 650 mg via ORAL
  Filled 2022-11-06: qty 2

## 2022-11-06 MED ORDER — SODIUM CHLORIDE 0.9% IV SOLUTION: 250.0000 mL | Freq: Once | INTRAVENOUS | Status: DC

## 2022-11-06 MED ORDER — SODIUM CHLORIDE 0.9 % IV SOLN: Freq: Once | INTRAVENOUS | Status: AC

## 2022-11-06 MED ORDER — SODIUM CHLORIDE 0.9% FLUSH
10.0000 mL | INTRAVENOUS | Status: AC | PRN
  Administered 2022-11-06: 10 mL

## 2022-11-06 NOTE — Patient Instructions (Signed)

## 2022-11-07 ENCOUNTER — Encounter: Payer: Self-pay | Admitting: Oncology

## 2022-11-07 MED FILL — Immune Globulin (Human) IV Soln 10 GM/100ML: INTRAVENOUS | Qty: 450 | Status: AC

## 2022-11-08 ENCOUNTER — Other Ambulatory Visit: Payer: Self-pay | Admitting: Oncology

## 2022-11-08 ENCOUNTER — Inpatient Hospital Stay: Payer: Medicare Other

## 2022-11-08 VITALS — BP 121/58 | HR 88 | Temp 98.7°F | Resp 18 | Ht 61.5 in | Wt 186.0 lb

## 2022-11-08 DIAGNOSIS — B370 Candidal stomatitis: Secondary | ICD-10-CM

## 2022-11-08 DIAGNOSIS — D819 Combined immunodeficiency, unspecified: Secondary | ICD-10-CM | POA: Diagnosis not present

## 2022-11-08 DIAGNOSIS — D801 Nonfamilial hypogammaglobulinemia: Secondary | ICD-10-CM

## 2022-11-08 MED ORDER — HEPARIN SOD (PORK) LOCK FLUSH 100 UNIT/ML IV SOLN
500.0000 [IU] | Freq: Once | INTRAVENOUS | Status: AC | PRN
  Administered 2022-11-08: 500 [IU]

## 2022-11-08 MED ORDER — DEXTROSE 5 % IV SOLN: Freq: Once | INTRAVENOUS | Status: AC

## 2022-11-08 MED ORDER — ACETAMINOPHEN 325 MG PO TABS
650.0000 mg | ORAL_TABLET | Freq: Once | ORAL | Status: AC
  Administered 2022-11-08: 650 mg via ORAL
  Filled 2022-11-08: qty 2

## 2022-11-08 MED ORDER — SODIUM CHLORIDE 0.9% FLUSH
10.0000 mL | Freq: Once | INTRAVENOUS | Status: AC | PRN
  Administered 2022-11-08: 10 mL

## 2022-11-08 MED ORDER — DIPHENHYDRAMINE HCL 50 MG/ML IJ SOLN
25.0000 mg | Freq: Once | INTRAMUSCULAR | Status: AC
  Administered 2022-11-08: 25 mg via INTRAVENOUS
  Filled 2022-11-08: qty 1

## 2022-11-08 MED ORDER — IMMUNE GLOBULIN (HUMAN) 10 GM/100ML IV SOLN
0.9000 g/kg | Freq: Once | INTRAVENOUS | Status: AC
  Administered 2022-11-08: 45 g via INTRAVENOUS
  Filled 2022-11-08: qty 50

## 2022-11-08 MED ORDER — FLUCONAZOLE 200 MG PO TABS
200.0000 mg | ORAL_TABLET | Freq: Every day | ORAL | 1 refills | Status: DC
Start: 2022-11-08 — End: 2022-11-22

## 2022-11-08 NOTE — Progress Notes (Signed)
[facility]  [address] [place],  [place]  [address] [phone]  Clinic Day:  11/08/2022  Referring physician: [name], MD  ASSESSMENT & PLAN:   Assessment & Plan: No problem-specific Assessment & Plan notes found for this encounter.  The patient and her son understand the plans discussed today and are in agreement with them.  She knows to contact our office if she develops concerns prior to her next appointment.   I provided 20 minutes of face-to-face time during this encounter and > 50% was spent counseling as documented under my assessment and plan.    [name], PA-C  [facility] AT [facility] [address] [place] [address] Dept: [phone] Dept Fax: [phone]   No orders of the defined types were placed in this encounter.     CHIEF COMPLAINT:  CC: CLL  Current Treatment: Pirtobrutinib 100 mg daily.  HISTORY OF PRESENT ILLNESS:  Samantha Warren is a 77 y.o. 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  She was on observation until November 2008. She was initially treated with chlorambucil and prednisone, as she refused intravenous chemotherapy.  She had a partial response to this regimen.  By January 2010, she had progressive disease with a white count over 200,000, with associated anemia, splenomegaly, thrombocytopenia, and adenopathy.  She then received 5 cycles of fludarabine with good response.  She did well until January 2012, when she had progression once again.  She had a single dose of bendamustine and rituximab, which kept her disease under control for over a year.  However, she had a severe allergic reaction to the rituximab, so has not received further rituximab.  She was hospitalized after the bendamustine because of severe pancytopenia and required multiple transfusions, so was placed on observation.  In March 2013, she  had progression of disease again, so was treated with bendamustine for 6 cycles, at a 50% dose reduction, and once again had a excellent response.  She was on observation until August 2014, then had progression of disease, so was placed on ibrutinib 420 mg daily.  The ibrutinib had kept her disease under fairly good control, but then she develops severe bilateral lower extremity cellulitis in April 2016, so ibrutinib was placed on hold.  She had persistent cellulitis, as well as  Clostridium difficile colitis in May 2016 requiring hospitalization.  She was readmitted soon after discharge with worsening cellulitis, requiring a prolonged hospitalization.  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 She eventually had resolution of the cellulitis   As she was largely asymptomatic, we continued observation until February 2017, at which time she had worsening splenomegaly.  Repeat CT imaging in February showed progressive disease, with marked splenomegaly and bulky lymphadenopathy.  She was then placed back on ibrutinib 420mg  daily.  The lymphocytosis, anemia and thrombocytopenia slowly improved.  Due to neutropenia, ibrutinib was placed on hold in December 2017.  The ibrutinib was resumed in January 2018, but discontinued in August 2018 due to toxicities, mainly severe abdominal muscle cramping.  She was hospitalized in early September 2018 with urinary tract infection and pancytopenia.  She received multiple red blood cell and platelet transfusions during her stay.  She was admitted again in late September 2018 with perineal cellulitis extending to the lower abdomen and upper thigh, as well as persistent pancytopenia.  CT abdomen and pelvis revealed interval decrease in the abdominal lymphadenopathy, chronic massive splenomegaly with scattered small splenic infarcts, and small left pleural effusion with bibasilar atelectasis.  She  required packed red blood cells while hospitalized.  She was discharged home with home health and [facility]  We continued to follow her closely, but did not place her on a treatment due to the persistent cellulitis.  She then was admitted in October 2018 with severe hypercalcemia, with a calcium of 15.  She was treated with IV fluids, zoledronic acid and Calcitonin injections.  We continued to monitor her closely.  She had recurrent hypercalcemia, for which she received IV fluids and zolendronic acid as an outpatient.     Due to worsening pancytopenia, she underwent bone marrow biopsy in January 2019.   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  The cellulitis had improved to a point we felt she could be treated again, so she was placed on venetoclax CLL in January 2019.  She has continued on venetoclax 400 mg daily after ramp up dosing and has tolerated that fairly well, except for mild diarrhea.  Since starting venetoclax, she has had decreasing splenomegaly and improvement in her pancytopenia.  She initially continued to require IV fluids and zoledronic acid for the hypercalcemia.  Her last dose of zoledronic acid was given in March 2019.  In March, she had an irregular heart rhythm and EKG revealed occasional premature atrial complexes, as well as an incomplete right bundle branch block.  This was stable compared to EKG done in October 2018.  Quantitative immunoglobulins done in August 2019 revealed a normal IgG, with low IgA and IgM.  She has continued IVIG monthly.  She had an episode of Campylobacter diarrhea in November 2019, which was treated with azithromycin for 7 days with resolution of her diarrhea. Venetoclax was held temporarily until her diarrhea resolved.  She has since tolerated venetoclax without significant difficulty.     In February 2022, she had a right diagnostic mammogram due to right breast  subareolar tenderness, which did not reveal any evidence of malignancy.  CT abdomen in April did not reveal any acute abnormality or other findings to explain the left-sided abdominal pain.  There was resolution of previously seen gross splenomegaly.  There were prominent retroperitoneal lymph nodes, which had significantly diminished in size compared to prior exam.  A hiatal hernia was seen. There was descending colonic diverticulosis without evidence of acute diverticulitis.  Bilateral screening mammogram in May 2022 revealed possible asymmetry in the left breast.  Left diagnostic mammogram and ultrasound in June revealed a cluster of cysts/apocrine cyst in the left breast at 2 o'clock, 3 cm the nipple, measuring 6 x 3 x 6 mm, unchanged from the exam dated May 2021. There were no solid masses or suspicious lesions. She is up to date on colonoscopy with her last colonoscopy being in June 2022 with Dr. Jennye Boroughs.  This was negative, and repeat in 10 years was advised.    She had worsening anemia and thrombocytopenia, felt to be most likely due to progressive chronic lymphocytic leukemia in April.  Venetoclax was discontinued.  Bone marrow biopsy was performed in interventional radiology in April.  Pathology was consistent with CLL with a normocellular bone marrow of 30%, but 20% of the cellularity was her CLL.  Flow cytometry confirms at 78% of the lymphocytes are abnormal CD5/CD19 positive B-cell population.  There was not evidence  of transformation.  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  She was hospitalized in May with multifocal anemia.  Also had recurrent C. difficile.  She was transfused packed red blood cells and platelets during that admission.  She was hospitalized again in June due to severe weakness.  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  She had severe thrombocytopenia and anemia requiring daily transfusions.  She had severe hematuria which  required continuous bladder irrigation, but finally resolved.  She was placed on high-dose prednisone and we have continued to taper.  She was started on pirtobrutinib 50 mg daily, then this was increased to 100 mg daily.  She eventually had decreased transfusion requirements and was able to be discharged to rehab.  She is improving from her stroke.  We have been seeing her weekly for continued supportive care.    We increased the pirtobrutinib to the full dose of 200 mg daily on July 9 and continued to taper her prednisone.  She appeared to be tolerating this well.  She was seen on July 23 with severe right flank pain.  KUB was at the nursing home was negative.  CT abdomen/pelvis revealed retroperitoneal adenopathy measuring up to 3.1 cm, which was stable, with resolution of previously seen bilateral retrocrural lymphadenopathy.  No explanation for the right flank pain. She was started on Augmentin as urinalysis was suspicious for infection.  She was found to have have Klebsiella pneumoniae, so was switched to Levaquin.  She presented to the ER on June 24 with occultly with memory and speech, persistent numbness around her mouth CVA in June.    She was then admitted on July 30 from clinic with fevers, diarrhea and platelets 16,000 decreased from 35,000 the week prior.  Hemoglobin was 6.6 and platelets 14,000 in the ER, so she was transfused 1 unit of packed red blood cells and 1 plateletpheresis.  INTERVAL HISTORY:  Vision is here today for continued supportive care.  She states she continues pirtobrutinib 100 mg, 2 tablets daily without significant difficulty. She remains on prednisone 20 mg daily.  She denies fevers or chills. She denies pain. Her appetite is improving. Her weight has decreased 5 pounds over last week .    REVIEW OF SYSTEMS:  Review of Systems - Oncology   VITALS:  There were no vitals taken for this visit.  Repeat after rest revealed a pulse of 115 and respirations 20. Wt Readings  from Last 3 Encounters:  11/08/22 186 lb (84.4 kg)  11/05/22 186 lb 12.8 oz (84.7 kg)  10/29/22 188 lb 9.6 oz (85.5 kg)    There is no height or weight on file to calculate BMI.  Performance status (ECOG): 2 - Symptomatic, <50% confined to bed  PHYSICAL EXAM:  Physical Exam  LABS:      Latest Ref Rng & Units 11/05/2022   12:00 AM 10/29/2022   12:00 AM 10/22/2022   12:00 AM  CBC  WBC  7.0     6.8     12.1      Hemoglobin 12.0 - 16.0 7.4     7.4     9.3      Hematocrit 36 - 46 22     23     29       Platelets 150 - 400 K/uL 6     16     34         This result is  from an external source.      Latest Ref Rng & Units 11/05/2022   12:00 AM 10/29/2022   12:00 AM 10/22/2022   12:00 AM  CMP  BUN 4 - 21 11     11     13       Creatinine 0.5 - 1.1 0.7     0.7     0.7      Sodium 137 - 147 135     131     135      Potassium 3.5 - 5.1 mEq/L 3.9     4.6     4.1      Chloride 99 - 108 103     97     102      CO2 13 - 22 17     20     16       Calcium 8.7 - 10.7 9.4     8.6     8.7      Alkaline Phos 25 - 125 182     129     89      AST 13 - 35 39     46     31      ALT 7 - 35 U/L 13     12     26          This result is from an external source.     No results found for: "CEA1", "CEA" / No results found for: "CEA1", "CEA" No results found for: "PSA1" No results found for: "MWN027" No results found for: "CAN125"  No results found for: "TOTALPROTELP", "ALBUMINELP", "A1GS", "A2GS", "BETS", "BETA2SER", "GAMS", "MSPIKE", "SPEI" Lab Results  Component Value Date   TIBC 321 07/09/2022   FERRITIN 790 (H) 07/09/2022   IRONPCTSAT 27 07/09/2022   Lab Results  Component Value Date   LDH 378 (H) 07/09/2022    STUDIES:  No results found.    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 She has a 15 pack-year smoking history. She has never used smokeless tobacco. She reports current drug use. No history on file for alcohol use.The patient is accompanied by, her son Ethelene Browns, today.  Allergies:  Allergies  Allergen Reactions   Metronidazole     Other reaction(s): Other (See Comments)   Rituximab     Other reaction(s): Other (See Comments)   Sulfa Antibiotics Rash    Current Medications: Current Outpatient Medications  Medication Sig Dispense Refill   alendronate (FOSAMAX) 70 MG tablet      allopurinol (ZYLOPRIM) 300 MG tablet Take 1 tablet (300 mg total) by mouth daily. 30 tablet 1   amLODipine (NORVASC) 2.5 MG tablet Take 2.5 mg by mouth daily.     Ascorbic Acid (VITAMIN C WITH ROSE HIPS) 500 MG tablet Take 500 mg by mouth daily.     atorvastatin (  LIPITOR) 40 MG tablet Take 40 mg by mouth daily.     calcium carbonate (TUMS - DOSED IN MG ELEMENTAL CALCIUM) 500 MG chewable tablet Chew 1 tablet by mouth 3 (three) times daily.     collagenase (SANTYL) 250 UNIT/GM ointment Apply 1 Application topically daily.     Ergocalciferol 50 MCG (2000 UT) CAPS Take 50 Capfuls by mouth daily.     KLOR-CON M20 20 MEQ tablet Take 1 tablet (20 mEq total) by mouth daily. Patient voiced currently taking once daily provider aware 90 tablet 5   Multiple Vitamin (MULTIVITAMIN WITH MINERALS) TABS tablet Take 1 tablet by mouth daily.     omeprazole (PRILOSEC) 20 MG capsule TAKE 1 CAPSULE BY MOUTH EVERY DAY 90 capsule 3   oxyCODONE (ROXICODONE) 15 MG immediate release tablet Take 1 tablet (15 mg total) by mouth every 6 (six) hours as needed. 100 tablet 0   pirtobrutinib (JAYPIRCA) 100 MG tablet Take 2 tablets (200 mg  total) by mouth daily. 60 tablet 5   predniSONE (DELTASONE) 5 MG tablet Take 5 mg by mouth daily with breakfast.     No current facility-administered medications for this visit.   Facility-Administered Medications Ordered in Other Visits  Medication Dose Route Frequency Provider Last Rate Last Admin   alum & mag hydroxide-simeth (MAALOX/MYLANTA) 200-200-20 MG/5ML suspension 30 mL  30 mL Oral Once [name], MD       diphenhydrAMINE (BENADRYL) injection 25 mg  25 mg Intravenous Once [name], MD       heparin lock flush 100 unit/mL  500 Units Intracatheter Once PRN [name], MD       influenza vac split quadrivalent PF (FLUARIX) injection 0.5 mL  0.5 mL Intramuscular Once [name], MD       sodium chloride flush (NS) 0.9 % injection 10 mL  10 mL Intracatheter PRN ,  A, PA-C   10 mL at 09/26/20 0839   sodium chloride flush (NS) 0.9 % injection 10 mL  10 mL Intracatheter Once PRN [name], MD

## 2022-11-08 NOTE — Patient Instructions (Signed)

## 2022-11-12 ENCOUNTER — Inpatient Hospital Stay (INDEPENDENT_AMBULATORY_CARE_PROVIDER_SITE_OTHER): Payer: Medicare Other | Admitting: Hematology and Oncology

## 2022-11-12 ENCOUNTER — Encounter: Payer: Self-pay | Admitting: Hematology and Oncology

## 2022-11-12 ENCOUNTER — Inpatient Hospital Stay: Payer: Medicare Other

## 2022-11-12 DIAGNOSIS — R0603 Acute respiratory distress: Secondary | ICD-10-CM

## 2022-11-12 DIAGNOSIS — C9110 Chronic lymphocytic leukemia of B-cell type not having achieved remission: Secondary | ICD-10-CM | POA: Diagnosis not present

## 2022-11-12 DIAGNOSIS — D801 Nonfamilial hypogammaglobulinemia: Secondary | ICD-10-CM

## 2022-11-12 MED ORDER — SODIUM CHLORIDE 0.9% FLUSH
10.0000 mL | INTRAVENOUS | Status: DC | PRN
  Administered 2022-11-12: 10 mL

## 2022-11-12 NOTE — Progress Notes (Signed)
[facility]  [address] [place],  [place]  [address] [phone]  Clinic Day:  11/12/2022  Referring physician: [name], MD   CHIEF COMPLAINT:  CC: Chronic lymphocytic leukemia  Current Treatment: Pirtobrutinib 200 mg daily  HISTORY OF PRESENT ILLNESS:  Samantha Warren is a 77 y.o. female with long a history of lymphocytic leukemia.  Recently, she had progression with severe cytopenias, lymphadenopathy and worsening splenomegaly.  She was hospitalized in June with a CVA with left hemiparesis and numbness of her chin.  She was transfusion dependent receiving packed red blood cells and platelets nearly daily.  She was placed on pirtobrutinib 50 mg daily and high-dose prednisone.  She eventually had a decreased need for transfusion, so was discharged to rehab.  She was she was recovering from her CVA.  We have been tapering her prednisone and titrating up her dose of pirtobrutinib.  She has been on full dose of 200 mg daily since July 9.  She appeared to be having a response with a decrease need for transfusion and decreased splenomegaly.  CT abdomen and pelvis done on July 23 for right flank pain not reveal any acute abnormality.  There was stable retroperitoneal lymphadenopathy resolution of retrocrural lymphadenopathy and normal spleen.  She was hospitalized again on July 30 with fever and diarrhea, as well as worsening anemia and thrombocytopenia.  She received 2 units of packed red blood cells and 2 platelet pheresis as an inpatient.  IgG and IgM were low.  We resumed prednisone to 20 mg twice daily.  She was discharged home instead of back to rehab.  She was seen last week and had persistent anemia and thrombocytopenia.  She received IVIG, as well as 1 unit of packed red blood cells and 1 platelet pheresis.  INTERVAL HISTORY:  [name] is here today for continued supportive care and is in respiratory distress.  She is not very communicative, but  finally tells me that she is not having any pain.  She is oriented to person, place "hospital", not more specific, day and year.  When asked what her biggest concern was, she states trouble getting her breath.  Her son who cares for her states she has been having difficulty breathing for about 3 days and seemed more confused.  He states home health had been out to check her this weekend.  VITALS:  Blood pressure 126/111, pulse 131, respirations 26 and temperature 99.  Pulse oximetry 99% on room air. Wt Readings from Last 3 Encounters:  11/08/22 186 lb (84.4 kg)  11/05/22 186 lb 12.8 oz (84.7 kg)  10/29/22 188 lb 9.6 oz (85.5 kg)    Weight was not taken  Performance status (ECOG): 3 - Symptomatic, >50% confined to bed  PHYSICAL EXAM:  Physical Exam Constitutional:      General: She is in acute distress.     Appearance: She is ill-appearing and toxic-appearing. She is not diaphoretic.  Cardiovascular:     Rate and Rhythm: Regular rhythm. Tachycardia present.     Comments: Pulse rapid and thready Pulmonary:     Effort: Respiratory distress present.     Breath sounds: Decreased breath sounds (Throughout the lungs) present.  Musculoskeletal:     Right lower leg: Edema (Trace) present.     Left lower leg: Edema (Trace) present.     LABS:      Latest Ref Rng & Units 11/05/2022   12:00 AM 10/29/2022   12:00 AM 10/22/2022  12:00 AM  CBC  WBC  7.0     6.8     12.1      Hemoglobin 12.0 - 16.0 7.4     7.4     9.3      Hematocrit 36 - 46 22     23     29       Platelets 150 - 400 K/uL 6     16     34         This result is from an external source.      Latest Ref Rng & Units 11/05/2022   12:00 AM 10/29/2022   12:00 AM 10/22/2022   12:00 AM  CMP  BUN 4 - 21 11     11     13       Creatinine 0.5 - 1.1 0.7     0.7     0.7      Sodium 137 - 147 135     131     135      Potassium 3.5 - 5.1 mEq/L 3.9     4.6     4.1      Chloride 99 - 108 103     97     102      CO2 13 - 22 17     20      16       Calcium 8.7 - 10.7 9.4     8.6     8.7      Alkaline Phos 25 - 125 182     129     89      AST 13 - 35 39     46     31      ALT 7 - 35 U/L 13     12     26          This result is from an external source.     No results found for: "CEA1", "CEA" / No results found for: "CEA1", "CEA" No results found for: "PSA1" No results found for: "FIE332" No results found for: "CAN125"  No results found for: "TOTALPROTELP", "ALBUMINELP", "A1GS", "A2GS", "BETS", "BETA2SER", "GAMS", "MSPIKE", "SPEI" Lab Results  Component Value Date   TIBC 321 07/09/2022   FERRITIN 790 (H) 07/09/2022   IRONPCTSAT 27 07/09/2022   Lab Results  Component Value Date   LDH 378 (H) 07/09/2022    STUDIES:  No results found.    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 She has a 15 pack-year smoking history. She has never used smokeless tobacco. She reports current drug use. No history on file for alcohol use.The patient is accompanied by her son, Thayer Ohm, today.  Allergies:  Allergies  Allergen Reactions   Metronidazole     Other reaction(s): Other (See Comments)   Rituximab  Other reaction(s): Other (See Comments)   Sulfa Antibiotics Rash    Current Medications: Current Outpatient Medications  Medication Sig Dispense Refill   alendronate (FOSAMAX) 70 MG tablet      allopurinol (ZYLOPRIM) 300 MG tablet Take 1 tablet (300 mg total) by mouth daily. 30 tablet 1   amLODipine (NORVASC) 2.5 MG tablet Take 2.5 mg by  mouth daily.     Ascorbic Acid (VITAMIN C WITH ROSE HIPS) 500 MG tablet Take 500 mg by mouth daily.     atorvastatin (LIPITOR) 40 MG tablet Take 40 mg by mouth daily.     calcium carbonate (TUMS - DOSED IN MG ELEMENTAL CALCIUM) 500 MG chewable tablet Chew 1 tablet by mouth 3 (three) times daily.     collagenase (SANTYL) 250 UNIT/GM ointment Apply 1 Application topically daily.     Ergocalciferol 50 MCG (2000 UT) CAPS Take 50 Capfuls by mouth daily.     fluconazole (DIFLUCAN) 200 MG tablet Take 1 tablet (200 mg total) by mouth daily. 10 tablet 1   KLOR-CON M20 20 MEQ tablet Take 1 tablet (20 mEq total) by mouth daily. Patient voiced currently taking once daily provider aware 90 tablet 5   Multiple Vitamin (MULTIVITAMIN WITH MINERALS) TABS tablet Take 1 tablet by mouth daily.     omeprazole (PRILOSEC) 20 MG capsule TAKE 1 CAPSULE BY MOUTH EVERY DAY 90 capsule 3   oxyCODONE (ROXICODONE) 15 MG immediate release tablet Take 1 tablet (15 mg total) by mouth every 6 (six) hours as needed. 100 tablet 0   pirtobrutinib (JAYPIRCA) 100 MG tablet Take 2 tablets (200 mg total) by mouth daily. 60 tablet 5   predniSONE (DELTASONE) 5 MG tablet Take 5 mg by mouth daily with breakfast.     No current facility-administered medications for this visit.   Facility-Administered Medications Ordered in Other Visits  Medication Dose Route Frequency Provider Last Rate Last Admin   alum & mag hydroxide-simeth (MAALOX/MYLANTA) 200-200-20 MG/5ML suspension 30 mL  30 mL Oral Once [name], MD       influenza vac split quadrivalent PF (FLUARIX) injection 0.5 mL  0.5 mL Intramuscular Once [name], MD       sodium chloride flush (NS) 0.9 % injection 10 mL  10 mL Intracatheter PRN ,  A, PA-C   10 mL at 09/26/20 0839   sodium chloride flush (NS) 0.9 % injection 10 mL  10 mL Intracatheter PRN [name], MD   10 mL at 11/12/22 4098     ASSESSMENT & PLAN:   Assessment & Plan: Tabethia Tregre is a 77 y.o. female with longstanding chronic lymphocytic leukemia.  She had been improving on pirtobrutinib 200 mg daily, then 2 weeks ago had fever of unknown origin, as well as worsening cytopenias requiring transfusion.  She received IVIG, 1 unit of packed red blood cells and 1 platelet pheresis last week.  She is in respiratory distress with tachycardia, concerning for potential fluid overload versus pulmonary embolism versus acute infection.  She was transferred to the [facility] ER via EMS for further treatment and evaluation.  The patient was seen, examined and plan formulated with Dr. Melvyn Neth.     [name], PA-C  [facility] AT [facility] [address] [place] [address] Dept: [phone] Dept Fax: [phone]   No orders of the defined types were placed in this encounter.

## 2022-11-13 DIAGNOSIS — C9110 Chronic lymphocytic leukemia of B-cell type not having achieved remission: Secondary | ICD-10-CM | POA: Diagnosis not present

## 2022-11-14 DIAGNOSIS — C9110 Chronic lymphocytic leukemia of B-cell type not having achieved remission: Secondary | ICD-10-CM | POA: Diagnosis not present

## 2022-11-15 DIAGNOSIS — C9110 Chronic lymphocytic leukemia of B-cell type not having achieved remission: Secondary | ICD-10-CM | POA: Diagnosis not present

## 2022-11-18 DIAGNOSIS — C9110 Chronic lymphocytic leukemia of B-cell type not having achieved remission: Secondary | ICD-10-CM | POA: Diagnosis not present

## 2022-11-19 ENCOUNTER — Inpatient Hospital Stay: Payer: Medicare Other | Admitting: Hematology and Oncology

## 2022-11-19 ENCOUNTER — Inpatient Hospital Stay: Payer: Medicare Other

## 2022-11-19 DIAGNOSIS — C9110 Chronic lymphocytic leukemia of B-cell type not having achieved remission: Secondary | ICD-10-CM | POA: Diagnosis not present

## 2022-11-20 ENCOUNTER — Inpatient Hospital Stay: Payer: Medicare Other

## 2022-11-20 DIAGNOSIS — C9110 Chronic lymphocytic leukemia of B-cell type not having achieved remission: Secondary | ICD-10-CM | POA: Diagnosis not present

## 2022-11-21 DIAGNOSIS — C9110 Chronic lymphocytic leukemia of B-cell type not having achieved remission: Secondary | ICD-10-CM | POA: Diagnosis not present

## 2022-11-22 ENCOUNTER — Encounter: Payer: Self-pay | Admitting: Oncology

## 2022-11-26 ENCOUNTER — Inpatient Hospital Stay: Payer: Medicare Other | Admitting: Hematology and Oncology

## 2022-11-26 ENCOUNTER — Inpatient Hospital Stay: Payer: Medicare Other

## 2022-11-27 ENCOUNTER — Encounter: Payer: Self-pay | Admitting: Hematology and Oncology

## 2022-11-27 ENCOUNTER — Inpatient Hospital Stay: Payer: Medicare Other

## 2022-11-28 ENCOUNTER — Other Ambulatory Visit (HOSPITAL_COMMUNITY): Payer: Self-pay

## 2022-12-01 DEATH — deceased

## 2022-12-04 ENCOUNTER — Ambulatory Visit: Payer: Medicare Other

## 2023-01-15 ENCOUNTER — Encounter: Payer: Self-pay | Admitting: Emergency Medicine
